# Patient Record
Sex: Male | Born: 1945 | Race: Black or African American | Hispanic: No | Marital: Single | Smoking: Former smoker
Health system: Southern US, Community
[De-identification: ages and names within clinical notes are randomized; demographics above are authoritative.]

## PROBLEM LIST (undated history)

## (undated) DIAGNOSIS — N1831 Chronic kidney disease, stage 3a: Secondary | ICD-10-CM

## (undated) DIAGNOSIS — I429 Cardiomyopathy, unspecified: Secondary | ICD-10-CM

## (undated) DIAGNOSIS — I679 Cerebrovascular disease, unspecified: Secondary | ICD-10-CM

## (undated) DIAGNOSIS — E785 Hyperlipidemia, unspecified: Secondary | ICD-10-CM

## (undated) DIAGNOSIS — I5042 Chronic combined systolic (congestive) and diastolic (congestive) heart failure: Secondary | ICD-10-CM

## (undated) DIAGNOSIS — I10 Essential (primary) hypertension: Secondary | ICD-10-CM

## (undated) DIAGNOSIS — I5022 Chronic systolic (congestive) heart failure: Secondary | ICD-10-CM

## (undated) DIAGNOSIS — B1920 Unspecified viral hepatitis C without hepatic coma: Secondary | ICD-10-CM

## (undated) DIAGNOSIS — K7460 Unspecified cirrhosis of liver: Secondary | ICD-10-CM

## (undated) DIAGNOSIS — M109 Gout, unspecified: Secondary | ICD-10-CM

## (undated) DIAGNOSIS — E7800 Pure hypercholesterolemia, unspecified: Secondary | ICD-10-CM

## (undated) DIAGNOSIS — I700 Atherosclerosis of aorta: Secondary | ICD-10-CM

## (undated) HISTORY — DX: Unspecified cirrhosis of liver: K74.60

## (undated) HISTORY — DX: Cardiomyopathy, unspecified: I42.9

## (undated) HISTORY — DX: Essential (primary) hypertension: I10

## (undated) HISTORY — PX: HERNIA REPAIR: SHX51

## (undated) HISTORY — DX: Cerebrovascular disease, unspecified: I67.9

## (undated) HISTORY — DX: Atherosclerosis of aorta: I70.0

## (undated) HISTORY — DX: Unspecified viral hepatitis C without hepatic coma: B19.20

## (undated) HISTORY — DX: Chronic systolic (congestive) heart failure: I50.22

## (undated) HISTORY — DX: Hyperlipidemia, unspecified: E78.5

## (undated) HISTORY — DX: Chronic kidney disease, stage 3a: N18.31

## (undated) HISTORY — DX: Gout, unspecified: M10.9

---
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
## 2003-01-10 ENCOUNTER — Emergency Department (HOSPITAL_COMMUNITY): Admission: EM | Admit: 2003-01-10 | Discharge: 2003-01-10 | Payer: Self-pay | Admitting: Emergency Medicine

## 2003-11-10 ENCOUNTER — Inpatient Hospital Stay (HOSPITAL_COMMUNITY): Admission: EM | Admit: 2003-11-10 | Discharge: 2003-11-12 | Payer: Self-pay | Admitting: Emergency Medicine

## 2005-03-30 ENCOUNTER — Ambulatory Visit (HOSPITAL_COMMUNITY): Admission: RE | Admit: 2005-03-30 | Discharge: 2005-03-30 | Payer: Self-pay | Admitting: Preventative Medicine

## 2006-01-29 ENCOUNTER — Emergency Department (HOSPITAL_COMMUNITY): Admission: EM | Admit: 2006-01-29 | Discharge: 2006-01-29 | Payer: Self-pay | Admitting: Emergency Medicine

## 2007-11-15 ENCOUNTER — Emergency Department (HOSPITAL_COMMUNITY): Admission: EM | Admit: 2007-11-15 | Discharge: 2007-11-15 | Payer: Self-pay | Admitting: Emergency Medicine

## 2007-12-03 ENCOUNTER — Emergency Department (HOSPITAL_COMMUNITY): Admission: EM | Admit: 2007-12-03 | Discharge: 2007-12-03 | Payer: Self-pay | Admitting: Emergency Medicine

## 2008-07-13 ENCOUNTER — Emergency Department (HOSPITAL_COMMUNITY): Admission: EM | Admit: 2008-07-13 | Discharge: 2008-07-13 | Payer: Self-pay | Admitting: Emergency Medicine

## 2009-05-15 ENCOUNTER — Emergency Department (HOSPITAL_COMMUNITY): Admission: EM | Admit: 2009-05-15 | Discharge: 2009-05-15 | Payer: Self-pay | Admitting: Emergency Medicine

## 2009-08-31 ENCOUNTER — Emergency Department (HOSPITAL_COMMUNITY): Admission: EM | Admit: 2009-08-31 | Discharge: 2009-08-31 | Payer: Self-pay | Admitting: Emergency Medicine

## 2009-10-16 ENCOUNTER — Emergency Department (HOSPITAL_COMMUNITY): Admission: EM | Admit: 2009-10-16 | Discharge: 2009-10-16 | Payer: Self-pay | Admitting: Emergency Medicine

## 2010-12-31 LAB — DIFFERENTIAL
Eosinophils Absolute: 0.2 10*3/uL (ref 0.0–0.7)
Lymphs Abs: 2.1 10*3/uL (ref 0.7–4.0)
Monocytes Absolute: 0.8 10*3/uL (ref 0.1–1.0)
Monocytes Relative: 7 % (ref 3–12)
Neutrophils Relative %: 72 % (ref 43–77)

## 2010-12-31 LAB — URINALYSIS, ROUTINE W REFLEX MICROSCOPIC
Glucose, UA: NEGATIVE mg/dL
Hgb urine dipstick: NEGATIVE
Specific Gravity, Urine: 1.02 (ref 1.005–1.030)

## 2010-12-31 LAB — COMPREHENSIVE METABOLIC PANEL
ALT: 72 U/L — ABNORMAL HIGH (ref 0–53)
AST: 82 U/L — ABNORMAL HIGH (ref 0–37)
Albumin: 3.5 g/dL (ref 3.5–5.2)
Alkaline Phosphatase: 88 U/L (ref 39–117)
Calcium: 9.5 mg/dL (ref 8.4–10.5)
GFR calc Af Amer: >60 mL/min (ref >60)
Glucose, Bld: 102 mg/dL — ABNORMAL HIGH (ref 70–99)
Potassium: 4.4 mEq/L (ref 3.5–5.1)
Sodium: 138 mEq/L (ref 135–145)
Total Protein: 8 g/dL (ref 6.0–8.3)

## 2010-12-31 LAB — CBC
MCHC: 34.6 g/dL (ref 30.0–36.0)
Platelets: 184 10*3/uL (ref 150–400)
RBC: 4.56 MIL/uL (ref 4.22–5.81)
RDW: 13.2 % (ref 11.5–15.5)
WBC: 11.3 10*3/uL — ABNORMAL HIGH (ref 4.0–10.5)

## 2011-02-10 NOTE — H&P (Signed)
NAME:  Don [name], Don [name]                         ACCOUNT NO.:  [id number]   MEDICAL RECORD NO.:  [id number]                   PATIENT TYPE:  INP   LOCATION:  A307                                 FACILITY:  [facility]   PHYSICIAN:  Vania Rea, M.D.              DATE OF BIRTH:  08/12/1946   DATE OF ADMISSION:  11/09/2003  DATE OF DISCHARGE:                                HISTORY & PHYSICAL   PRIMARY CARE PHYSICIAN:  Unassigned.   CHIEF COMPLAINT:  Pain and swelling of the left leg for 1 week.   HISTORY OF PRESENT ILLNESS:  This is a 65 year old African-American man with  no history of medical follow up, unemployed.  He drinks 1 pint of gin per  day, for many many years.  Smokes 1/2 pack of cigarettes per day since age  33. He is sexually active without condoms.  Last sexual activity 2 weeks ago.  Denies any significant past medical history, but has been having pain and  swelling of the left knee for 1 week.  The patient denies fevers, cough,  cold or chills.  The patient had not sought any treatment, but then  developed pain and swelling of the left foot 1-2 days ago and came to the  emergency room for assessment.   The patient has recurrent rash of the trunk for many many years, scaling and  itching comes and goes.  The patient has been having a rash of the face and  scalp.  He says for the past 1 year.  The patient has never been in prison,  but has been in jail; has never been tested for tuberculosis; has never been  tested for HIV.  The patient denies any discharge from the penis and denies  any sexually transmitted disease.   PAST MEDICAL HISTORY:  Recurrent rashes; rash on his face for 1 year.   MEDICATIONS:  Nil.   ALLERGIES:  BEE STINGS.   SOCIAL AND FAMILY HISTORY:  Tobacco 1/2 pack per day for 49 years.  Alcohol  1 pint of gin per day for over 40 years.  Denies marijuana, heroin, or  cocaine.  Heart disease in his mother and sister; but does not know most of  his family.   Patient denies sexual activity with men.   REVIEW OF SYSTEMS:  No further contributory.   PHYSICAL EXAMINATION:  GENERAL:  A middle-age, African-American man lying in  bed in no apparent distress.  VITAL SIGNS:  Temperature 98.4, pulse 67, respirations 20, blood pressure  119/68.  His temperature recorded in the emergency room was 99 degrees.  HEENT:  He has extensive seborrheic dermatitis of the entire scalp and  crusting around his ears with inflammatory seborrheic dermatitis of his face  including the nasolabial folds.  His mouth is moist.  No rashes.  He has a  few missing teeth.  Pupils are equal, round, and reactive to light.  He  is  anicteric.  There is no jugular venous distention.  No thyromegaly.  CHEST:  His chest is clear to auscultation bilaterally.  CARDIOVASCULAR:  Regular rhythm, no murmurs, rubs, or gallops.  ABDOMEN:  Mildly obese, soft, nontender, with normal abdominal bowel sounds.  EXTREMITIES:  He has swollen tender left knee with an obvious effusion.  There is no obvious swelling or tenderness of the legs.  On the left he has  multiple calluses of the edge of his feet and the soles of his feet.  Extending from a callus of the left outer edge of his left foot, is an area  of redness, swelling, and tenderness over the proximal metatarsal joints.  There are no cracks between his toes.  SKIN:  As mentioned he has extensive seborrheic dermatitis of the head and  neck.  On his trunk he has a few well-marginated, scaly, purplish patches 3  x 4 cm on his trunk, left side of his anterior abdomen and also the right  side of his back and also patches on his elbow.  These are compatible with  psoriasis.  The patient says they come and go without leaving a scar.  NAILS:  There is no evidence of pitting of his nails.  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  CENTRAL NERVOUS  SYSTEM:  He is alert and oriented x3. There are no focal  deficits.   LABS:  His white count is 16.4.  His hemoglobin and hematocrit are normal.  His white count is otherwise normal.  His sodium 131, potassium 3.6,  chloride 97, CO2 28, glucose 113, BUN 20, creatinine 9.  The patient  received 1 gm of Rocephin IV in the emergency room.   ASSESSMENT:  1. Cellulitis of the left foot.  2. Septic arthritis of the left knee.  3. Tobacco abuse.  4. EtOH abuse.  5. Psoriasis.  6. 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   PLAN:  1. In view of this conglomeration of system symptoms, gonococcal arthritis     of the left knee needs to be considered as well as the patient's HIV     status needs to be investigated.  2. We will continue with Rocephin and clindamycin for the cellulitis.  3. We will give him a steroid cream for the psoriasis as well as for the     face for the seborrheic dermatitis, a coal tar shampoo for his scalp.  4. We will aspirate the knee and send for cultures and investigations.  5. We will take blood for HIV testing.  6. Will put him on the Librium EtOH withdrawal protocol.     ___________________________________________                                         Vania Rea, M.D.   LC/MEDQ  D:  11/10/2003  T:  11/10/2003  Job:  [id number]

## 2011-02-10 NOTE — Discharge Summary (Signed)
Don [name], Don [name]                         ACCOUNT NO.:  [id number]   MEDICAL RECORD NO.:  [id number]                   PATIENT TYPE:  INP   LOCATION:  A307                                 FACILITY:  [facility]   PHYSICIAN:  Vania Rea, M.D.              DATE OF BIRTH:  Sep 04, 1946   DATE OF ADMISSION:  11/09/2003  DATE OF DISCHARGE:  11/12/2003                                 DISCHARGE SUMMARY   DISCHARGE DIAGNOSES:  1. 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  2. 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  3. EtOH abuse.  4. Status post EtOH withdrawal.  5. Tobacco abuse.  6. Psoriasis of the trunk and elbows.  7. Severe seborrheic eczema involving the scalp and the face.   DISPOSITION:  Discharged to home.   DISCHARGE CONDITION:  Improved.   HOSPITAL COURSE:  Please refer to history and physical of February 15.  This  is a 65 year old African-American male with a history of chronic EtOH abuse,  chronic tobacco abuse, sexually active without condoms who presented with a  significant swelling of the left knee and what appeared to be a cellulitis  of the lateral aspect of the left foot.   The patient was initially assessed as having a septic arthritis with  cellulitis of the foot, but after aspiration of the joint and analysis he  was confirmed to have gout by the presence of intracellular monosodium urate  crystals within the knee joint aspirate.  Antibiotics were discontinued and  the patient was treated with indomethacin and colchicine.  The patient had  previously been receiving ibuprofen as a part of his cellulitis regimen and  had been improving.  The patient was also noted, on admission, to have  extensive seborrheic eczema involving the face including nasolabial fold and  crusting around the ears and the scalp.  He was also noted to have patches  of purplish, well-marginated scaly lesions on the trunk and the  elbows  compatible with psoriasis which he said had been coming and going for many  years.  Because of the patient's severe eczema and his history of unsafe  sex, the patient had a VDRL and HIV test both of which were negative for  sexually transmitted disease.   On admission the patient's white count was 16.4, BUN of 20 and a creatinine  of 9.  His MCV was 97.  Today, at discharge, his white count is 12.3.  His  MCV remains at 97.  His BUN and creatinine are 7 and 0.8.  Liver related  enzymes have been completely normal throughout this admission.  However, his  __________ has been depressed 1.8-1.9; his total protein 5.9.  The patient  was treated also with folic acid, multivitamins and thiamin while in  hospital.   The patient is able to ambulate slowly with  the assistance of a walker.  The  patient was put on the EtOH withdrawal protocol because of his history of 1  pint of gin daily for over 40 years.  He tolerated the protocol without  difficulty.  He did not go into DTs.  The patient has been advised that his  diagnosis is acute gouty arthritis; that he will, in all likelihood, need  treatment for a prolonged period; that he has to abstain from alcohol as  this can aggravate his condition.  He has also been advised to avoid poultry  especially [place] and red meats. The patient had a 24-hour urine collection  to assess uric acid output.  The results are still pending.  The patient's  serum uric acid while in hospital was, at one time elevated at 7.8 and at  another time normal.   The patient is being discharged with a months' supply of indomethacin, a  months' supply of colchicine 0.6 mg  3 times a day, a months' supply of  gradually increasing allopurinol with the understanding that he will be  called with the results of his urinary acid excretion.  If the uric acid  output is low the allopurinol will be discontinued.  If it is over 110  mg/day it will be continued.   DISCHARGE  MEDICATIONS:  1. Indomethacin 50 mg 3 times a day.  2. Colchicine 0.6 mg 3 times daily.  3. Allopurinol 100 mg daily for 1 week, then 200 mg daily for 1 week, then     300 mg daily.  4. Librium 10 mg 3 times daily when necessary, 20 tablets prescribed.  5. Folic acid 1 mg daily.  6. Thiamine 100 mg daily.   FOLLOW UP:  The patient is to follow up with the [facility]  Department.  The patient is to have home health care to assess the progress  with the swelling of his knee; that he is ambulating sufficiently; and that  his medications are being taken correctly.     ___________________________________________                                         Vania Rea, M.D.   LC/MEDQ  D:  11/12/2003  T:  11/12/2003  Job:  [id number]

## 2011-06-19 LAB — BODY FLUID CELL COUNT WITH DIFFERENTIAL
Monocyte-Macrophage-Serous Fluid: 2 — ABNORMAL LOW
Neutrophil Count, Fluid: 94 — ABNORMAL HIGH
Total Nucleated Cell Count, Fluid: 18800 — ABNORMAL HIGH

## 2011-06-19 LAB — BODY FLUID CULTURE

## 2011-06-19 LAB — SYNOVIAL FLUID, CRYSTAL

## 2011-06-26 LAB — DIFFERENTIAL
Basophils Absolute: 0.1
Lymphocytes Relative: 17
Lymphs Abs: 1.4
Monocytes Absolute: 0.4
Monocytes Relative: 5
Neutro Abs: 5.9

## 2011-06-26 LAB — COMPREHENSIVE METABOLIC PANEL
AST: 115 — ABNORMAL HIGH
Albumin: 3.4 — ABNORMAL LOW
Calcium: 9.2
Creatinine, Ser: 1.02
GFR calc Af Amer: >60
Total Protein: 8.3

## 2011-06-26 LAB — CBC
MCHC: 34.8
MCV: 98.8
Platelets: 223
WBC: 8

## 2011-06-26 LAB — URINALYSIS, ROUTINE W REFLEX MICROSCOPIC
Bilirubin Urine: NEGATIVE
Glucose, UA: NEGATIVE
Hgb urine dipstick: NEGATIVE
Nitrite: NEGATIVE
Specific Gravity, Urine: 1.015
pH: 7

## 2011-06-26 LAB — POCT CARDIAC MARKERS
CKMB, poc: 4.6
Troponin i, poc: <0.05

## 2011-06-26 LAB — TSH: TSH: 1.1

## 2011-06-26 LAB — T4, FREE: Free T4: 0.83 — ABNORMAL LOW

## 2011-08-24 ENCOUNTER — Emergency Department (HOSPITAL_COMMUNITY)
Admission: EM | Admit: 2011-08-24 | Discharge: 2011-08-24 | Disposition: A | Discharge status: Home / Self Care | Payer: Medicare Other | Attending: Emergency Medicine | Admitting: Emergency Medicine

## 2011-08-24 ENCOUNTER — Emergency Department (HOSPITAL_COMMUNITY): Payer: Medicare Other

## 2011-08-24 ENCOUNTER — Encounter: Payer: Self-pay | Admitting: *Deleted

## 2011-08-24 DIAGNOSIS — K1370 Unspecified lesions of oral mucosa: Secondary | ICD-10-CM | POA: Insufficient documentation

## 2011-08-24 DIAGNOSIS — S01501A Unspecified open wound of lip, initial encounter: Secondary | ICD-10-CM | POA: Insufficient documentation

## 2011-08-24 DIAGNOSIS — R609 Edema, unspecified: Secondary | ICD-10-CM | POA: Insufficient documentation

## 2011-08-24 DIAGNOSIS — I10 Essential (primary) hypertension: Secondary | ICD-10-CM | POA: Insufficient documentation

## 2011-08-24 DIAGNOSIS — W11XXXA Fall on and from ladder, initial encounter: Secondary | ICD-10-CM | POA: Insufficient documentation

## 2011-08-24 DIAGNOSIS — W19XXXA Unspecified fall, initial encounter: Secondary | ICD-10-CM

## 2011-08-24 DIAGNOSIS — Z87891 Personal history of nicotine dependence: Secondary | ICD-10-CM | POA: Insufficient documentation

## 2011-08-24 DIAGNOSIS — K029 Dental caries, unspecified: Secondary | ICD-10-CM | POA: Insufficient documentation

## 2011-08-24 DIAGNOSIS — S01511A Laceration without foreign body of lip, initial encounter: Secondary | ICD-10-CM

## 2011-08-24 HISTORY — DX: Essential (primary) hypertension: I10

## 2011-08-24 MED ORDER — HYDROCODONE-ACETAMINOPHEN 5-325 MG PO TABS: ORAL_TABLET | ORAL | Status: AC

## 2011-08-24 MED ORDER — ONDANSETRON HCL 4 MG/2ML IJ SOLN
4.0000 mg | Freq: Once | INTRAMUSCULAR | Status: AC
Start: 2011-08-24 — End: 2011-08-24
  Administered 2011-08-24: 4 mg via INTRAMUSCULAR
  Filled 2011-08-24: qty 2

## 2011-08-24 MED ORDER — LIDOCAINE HCL (PF) 1 % IJ SOLN
INTRAMUSCULAR | Status: AC
  Administered 2011-08-24: 5 mL
  Filled 2011-08-24: qty 5

## 2011-08-24 MED ORDER — CLINDAMYCIN HCL 300 MG PO CAPS: ORAL_CAPSULE | ORAL | Status: DC

## 2011-08-24 MED ORDER — LIDOCAINE HCL (PF) 1 % IJ SOLN
5.0000 mL | Freq: Once | INTRAMUSCULAR | Status: AC
  Administered 2011-08-24: 5 mL via SUBCUTANEOUS
  Administered 2011-08-24: 5 mL
  Filled 2011-08-24: qty 5

## 2011-08-24 MED ORDER — MORPHINE SULFATE 10 MG/ML IJ SOLN
6.0000 mg | Freq: Once | INTRAMUSCULAR | Status: AC
  Administered 2011-08-24: 6 mg via INTRAMUSCULAR
  Filled 2011-08-24: qty 1

## 2011-08-24 MED ORDER — MORPHINE SULFATE 10 MG/ML IJ SOLN
2.0000 mg | Freq: Once | INTRAMUSCULAR | Status: AC
  Administered 2011-08-24: 2 mg via INTRAMUSCULAR
  Filled 2011-08-24: qty 1

## 2011-08-24 MED ORDER — LIDOCAINE HCL (PF) 1 % IJ SOLN
INTRAMUSCULAR | Status: AC
  Administered 2011-08-24: 5 mL via SUBCUTANEOUS
  Filled 2011-08-24: qty 5

## 2011-08-24 NOTE — ED Notes (Signed)
Pts face cleaned, rinsed mouth out with water. NAD.

## 2011-08-24 NOTE — ED Notes (Signed)
Pt was coming down a ladder and fell approximately 5-6 feet. Pt landed on his face and has a very large laceration to his upper lip. Bleeding at this time. Denies loss of consciousness.

## 2011-08-24 NOTE — ED Provider Notes (Signed)
History     CSN: [id number] Arrival date & time: 08/24/2011 11:40 AM   First MD Initiated Contact with Patient 08/24/11 1153      Chief Complaint  Patient presents with  . Fall    (Consider location/radiation/quality/duration/timing/severity/associated sxs/prior treatment) HPI Comments: Patient c/o pain to his mouth and chin with a laceration to the upper lip and inability to retract the lower lip.  He denies other injuries, neck pain, back pain or LOC.  Patient is a 65 y.o. male presenting with skin laceration. The history is provided by the patient.  Laceration  The incident occurred less than 1 hour ago. Pain location: upper and lower lip. The laceration is 8 cm in size. The laceration mechanism was a a blunt object. The pain is moderate. The pain has been constant since onset. He reports no foreign bodies present. His tetanus status is UTD.    Past Medical History  Diagnosis Date  . Hypertension     History reviewed. No pertinent past surgical history.  History reviewed. No pertinent family history.  History  Substance Use Topics  . Smoking status: Former Games developer  . Smokeless tobacco: Not on file  . Alcohol Use: Yes     occasionally      Review of Systems  Constitutional: Negative for fever, chills and fatigue.  HENT: Positive for facial swelling and dental problem. Negative for ear pain, nosebleeds, congestion, sore throat, rhinorrhea, trouble swallowing, neck pain and neck stiffness.   Eyes: Negative for visual disturbance.  Respiratory: Negative for chest tightness and shortness of breath.   Cardiovascular: Negative for chest pain.  Gastrointestinal: Negative for nausea, vomiting and abdominal pain.  Genitourinary: Negative for dysuria, hematuria and flank pain.  Musculoskeletal: Negative for myalgias, back pain and arthralgias.  Skin: Positive for wound. Negative for rash.  Neurological: Negative for dizziness, syncope, weakness, numbness and headaches.    Hematological: Does not bruise/bleed easily.  Psychiatric/Behavioral: Negative for confusion.  All other systems reviewed and are negative.    Allergies  Penicillins  Home Medications   Current Outpatient Rx  Name Route Sig Dispense Refill  . ALLOPURINOL 100 MG PO TABS Oral Take 100 mg by mouth daily.      Marland Kitchen LISINOPRIL 10 MG PO TABS Oral Take 10 mg by mouth daily.      Marland Kitchen NAPROXEN 500 MG PO TABS Oral Take 500 mg by mouth 2 (two) times daily as needed. For pain     . OMEPRAZOLE 20 MG PO CPDR Oral Take 20 mg by mouth 2 (two) times daily.        BP 173/85  Pulse 73  Temp(Src) 97.3 F (36.3 C) (Axillary)  Resp 20  Ht 5\' 8"  (1.727 m)  Wt 165 lb (74.844 kg)  BMI 25.09 kg/m2  SpO2 98%  Physical Exam  Nursing note and vitals reviewed. Constitutional: He is oriented to person, place, and time. He appears well-developed and well-nourished. No distress.  HENT:  Head: Normocephalic and atraumatic. No trismus in the jaw.  Right Ear: Tympanic membrane and ear canal normal. No hemotympanum.  Left Ear: Tympanic membrane and ear canal normal. No hemotympanum.  Nose: Nose normal. No nose lacerations, sinus tenderness or nasal deformity. No epistaxis.  Mouth/Throat: Uvula is midline, oropharynx is clear and moist and mucous membranes are normal. Abnormal dentition. Lacerations and dental caries present. No uvula swelling.         Extensive laceration of the upper lip that extends through the [place] border and into  the oral mucosa.  Multiple dental caries with three lower teeth embedded into the mucosa of the lower lip.  Eyes: Conjunctivae and EOM are normal. Pupils are equal, round, and reactive to light.  Neck: Normal range of motion. Neck supple.  Cardiovascular: Normal rate, regular rhythm and normal heart sounds.   No murmur heard. Pulmonary/Chest: Effort normal and breath sounds normal. No respiratory distress. He exhibits no tenderness.  Abdominal: Soft. He exhibits no  distension. There is no tenderness.  Musculoskeletal: Normal range of motion. He exhibits no tenderness.  Lymphadenopathy:    He has no cervical adenopathy.  Neurological: He is alert and oriented to person, place, and time. No cranial nerve deficit. He exhibits normal muscle tone. Coordination normal.  Skin: Skin is warm and dry.       See HENT exam  Psychiatric: He has a normal mood and affect.    ED Course  Procedures (including critical care time)  Ct Maxillofacial Wo Cm  08/24/2011  *RADIOLOGY REPORT*  Clinical Data: Larey Seat face first from a ladder, pain at mouth, states she cannot move jaw  CT MAXILLOFACIAL WITHOUT CONTRAST  Technique:  Multidetector CT imaging of the maxillofacial structures was performed. Multiplanar CT image reconstructions were also generated. Right side of face marked with BB.  Comparison: None Correlation:  MRI brain 03/30/2005  Findings: Intraorbital soft tissue planes clear. Visualized intracranial structures significant only for mild age related cerebral atrophy. Minimal supraorbital scalp contusion/soft tissue swelling. Extensive atherosclerotic calcification at the left carotid bifurcation. Deformity of medial wall of left orbit compatible with old fracture, unchanged since MRI. Scattered periodontal disease. Visualized portion of cervical spine intact. No acute facial bone fracture or acute bony abnormalities identified.  IMPRESSION: Old fracture medial wall left orbit. No acute facial bony abnormalities. Scattered periodontal disease. Significant calcified plaque formation at the left carotid bifurcation.  Original Report Authenticated By: Lollie Marrow, M.D.   LACERATION REPAIR Performed by: Maxwell Caul. Authorized by: Maxwell Caul Consent: Verbal consent obtained. Risks and benefits: risks, benefits and alternatives were discussed Consent given by: patient Patient identity confirmed: provided demographic data Prepped and Draped in normal sterile  fashion Wound explored  Laceration Location:upper lip Laceration Length: 8 cm  No Foreign Bodies seen or palpated  Anesthesia: local infiltration  Local anesthetic: lidocaine 1% plain lidocaine  Anesthetic total: 3ml  Irrigation method: syringe Amount of cleaning: standard  Skin closure: 6-0 vicryl Number of sutures:12 Technique: subcuticular, simple interrupted Patient tolerance: Patient tolerated the procedure well with no immediate complications.   LACERATION REPAIR Performed by: Suriah Peragine L. Authorized by: Maxwell Caul Consent: Verbal consent obtained. Risks and benefits: risks, benefits and alternatives were discussed Consent given by: patient Patient identity confirmed: provided demographic data Prepped and Draped in normal sterile fashion Wound explored  Laceration Location: oral mucosa,lower lip Laceration Length: 2cm  No Foreign Bodies seen or palpated  Anesthesia: local infiltration  Local anesthetic: lidocaine 1% w/o epinephrine  Anesthetic total: 1 ml  Irrigation method: syringe Amount of cleaning: standard  Skin closure:6-0 vicryl  Number of sutures: 3  Technique:simple interrupted  Patient tolerance: Patient tolerated the procedure well with no immediate complications.    MDM  1350  Diffuse edema of the chin and unable to retract the lower lip.  Will order CT maxillofacial to r/o mandible fx   1400  I have discussed pt hx and exam findings with EDP.  Patient also seen by EDP.    1650  I have consulted general dentist on  call, Dr. Renard Hamper, he agrees to see pt in his office for follow-up next week.  Lacerations to the upper and lower lips were approximated with vicryl sutures.  Patient has three teeth embedded in the oral mucosa and lower lip was successfully retracted with manipulation were due to multiple dental caries and dental avulsions, I will start him on Clindamycin and he agrees to f/u with dentistry.        Manfred Laspina L.  [place], [place] 08/24/11 1759

## 2011-08-24 NOTE — ED Notes (Signed)
Also c/o pain in his chin and jaw.

## 2011-08-24 NOTE — ED Notes (Signed)
Patient transported to CT.  NAD at this time. 

## 2011-08-24 NOTE — ED Notes (Signed)
Pt alert and oriented x 4 with respirations even and unlabored.  NAD at this time.  Discharge instructions and Rx x 2 reviewed with pt and pt's family member and pt and family member verbalized understanding.  Pt transported to lobby via wheelchair for pt safety.  Patient's wife to transport home.

## 2011-08-25 NOTE — ED Provider Notes (Signed)
Medical screening examination/treatment/procedure(s) were conducted as a shared visit with non-physician practitioner(s) and myself.  I personally evaluated the patient during the encounter  Pt reportedly fell 5-6 feet from ladder landing on his face.  Significant laceration to lip.  No loc or use of blood thinning medications.  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  On lower lip had 3 teeth embedded into mucosa of lip.  No malocclusion, epistaxis, septal hematoma.  Some swelling to the chin.  Has some loose teeth which he states were all loose prior to the injury  CT maxfac, repair wounds, tdap, abx.  Discuss with dental for urgent follow up  Dayton Bailiff, MD 08/25/11 [phone]

## 2012-02-01 ENCOUNTER — Encounter (HOSPITAL_COMMUNITY): Payer: Self-pay | Admitting: Emergency Medicine

## 2012-02-01 ENCOUNTER — Emergency Department (HOSPITAL_COMMUNITY): Payer: Medicare Other

## 2012-02-01 ENCOUNTER — Emergency Department (HOSPITAL_COMMUNITY)
Admission: EM | Admit: 2012-02-01 | Discharge: 2012-02-01 | Disposition: A | Discharge status: Home / Self Care | Payer: Medicare Other | Attending: Emergency Medicine | Admitting: Emergency Medicine

## 2012-02-01 DIAGNOSIS — R9431 Abnormal electrocardiogram [ECG] [EKG]: Secondary | ICD-10-CM | POA: Insufficient documentation

## 2012-02-01 DIAGNOSIS — Z79899 Other long term (current) drug therapy: Secondary | ICD-10-CM | POA: Insufficient documentation

## 2012-02-01 DIAGNOSIS — R040 Epistaxis: Secondary | ICD-10-CM | POA: Insufficient documentation

## 2012-02-01 DIAGNOSIS — R42 Dizziness and giddiness: Secondary | ICD-10-CM | POA: Insufficient documentation

## 2012-02-01 DIAGNOSIS — E7800 Pure hypercholesterolemia, unspecified: Secondary | ICD-10-CM | POA: Insufficient documentation

## 2012-02-01 DIAGNOSIS — I10 Essential (primary) hypertension: Secondary | ICD-10-CM | POA: Insufficient documentation

## 2012-02-01 DIAGNOSIS — H538 Other visual disturbances: Secondary | ICD-10-CM | POA: Insufficient documentation

## 2012-02-01 HISTORY — DX: Pure hypercholesterolemia, unspecified: E78.00

## 2012-02-01 LAB — BASIC METABOLIC PANEL
Chloride: 103 mEq/L (ref 96–112)
Creatinine, Ser: 0.85 mg/dL (ref 0.50–1.35)
GFR calc Af Amer: >90 mL/min (ref >90)
GFR calc non Af Amer: 89 mL/min — ABNORMAL LOW (ref >90)
Potassium: 3.7 mEq/L (ref 3.5–5.1)

## 2012-02-01 LAB — URINALYSIS, ROUTINE W REFLEX MICROSCOPIC
Ketones, ur: NEGATIVE mg/dL
Leukocytes, UA: NEGATIVE
Nitrite: NEGATIVE
Specific Gravity, Urine: 1.015 (ref 1.005–1.030)
Urobilinogen, UA: 1 mg/dL (ref 0.0–1.0)
pH: 6.5 (ref 5.0–8.0)

## 2012-02-01 LAB — CBC
MCH: 34.3 pg — ABNORMAL HIGH (ref 26.0–34.0)
MCHC: 34.9 g/dL (ref 30.0–36.0)
RDW: 13.1 % (ref 11.5–15.5)

## 2012-02-01 LAB — DIFFERENTIAL
Basophils Absolute: 0.1 10*3/uL (ref 0.0–0.1)
Basophils Relative: 1 % (ref 0–1)
Eosinophils Absolute: 0.7 10*3/uL (ref 0.0–0.7)
Neutro Abs: 3.9 10*3/uL (ref 1.7–7.7)
Neutrophils Relative %: 55 % (ref 43–77)

## 2012-02-01 MED ORDER — MECLIZINE HCL 25 MG PO TABS: 25.0000 mg | ORAL_TABLET | Freq: Three times a day (TID) | ORAL | Status: AC | PRN

## 2012-02-01 MED ORDER — OXYMETAZOLINE HCL 0.05 % NA SOLN
2.0000 | Freq: Once | NASAL | Status: AC
  Administered 2012-02-01: 2 via NASAL
  Filled 2012-02-01: qty 15

## 2012-02-01 NOTE — ED Notes (Signed)
tammy pa to see pt.

## 2012-02-01 NOTE — ED Notes (Signed)
Pt c/o dizziness since yesterday with nosebleed this am. Denies cp/sob. nad noted.

## 2012-02-01 NOTE — ED Provider Notes (Signed)
10:35 AM  Date: 02/01/2012  Rate:61  Rhythm: normal sinus rhythm and sinus arrhythmia  QRS Axis: left  Intervals: normal QRS:  Left ventricular hypertrophy; poor R wave progression in the precordial leads suggests possible old anterior myocardial infarction.  ST/T Wave abnormalities: normal  Conduction Disutrbances:none  Narrative Interpretation: Abnormal EKG.  Old EKG Reviewed: unchanged    Carleene Cooper III, MD 02/01/12 1038

## 2012-02-01 NOTE — ED Notes (Signed)
Pt taken to mri by wheelchair.

## 2012-02-01 NOTE — ED Provider Notes (Signed)
Medical screening examination/treatment/procedure(s) were performed by non-physician practitioner and as supervising physician I was immediately available for consultation/collaboration.   Carleene Cooper III, MD 02/01/12 [phone]

## 2012-02-01 NOTE — ED Provider Notes (Signed)
History     CSN: [id number]  Arrival date & time 02/01/12  8295   First MD Initiated Contact with Patient 02/01/12 1001      Chief Complaint  Patient presents with  . Dizziness  . Epistaxis    (Consider location/radiation/quality/duration/timing/severity/associated sxs/prior treatment) HPI Comments: Patient complains of gradual onset of dizziness that began yesterday evening. He states the dizziness is worse with change in position. He denies a spinning sensation. He states that he woke up this morning with bleeding from the left nostril that lasted approximately 5 minutes and resolved after slight pressure. He also reports having blurred vision this morning and describes the sensation as" a film over my eyes". He states that his gait has been unsteady since yesterday.  He denies vomiting, chest pain, dyspnea, numbness or weakness. His spouse reports that he has had similar symptoms previously that resolved spontaneously. Patient also has an appointment with his primary care physician, Dr. Janna Arch, next month.  Patient also denies new medications or recent change in dosage.    Patient is a 66 y.o. male presenting with nosebleeds and neurologic complaint. The history is provided by the patient and the spouse.  Epistaxis  This is a new problem. The current episode started less than 1 hour ago. The problem has been resolved. The problem is associated with an unknown factor. The bleeding has been from the left nare. He has tried applying pressure for the symptoms. The treatment provided significant relief. His past medical history does not include bleeding disorder.  Neurologic Problem The primary symptoms include dizziness and visual change. Primary symptoms do not include headaches, syncope, loss of consciousness, altered mental status, seizures, paresthesias, focal weakness, loss of sensation, speech change, memory loss, fever, nausea or vomiting. The symptoms began yesterday. The symptoms are  unchanged. The neurological symptoms are diffuse.  He describes the dizziness as lightheadedness. The dizziness began yesterday. The dizziness has been unchanged since its onset. It is a recurrent problem. Dizziness also occurs with blurred vision. Dizziness does not occur with tinnitus, hearing loss, nausea, vomiting, weakness or diaphoresis.  The visual change includes blurred vision. The visual change does not include photophobia.  Additional symptoms include loss of balance. Additional symptoms do not include neck stiffness, weakness, pain, lower back pain, leg pain, photophobia, aura, nystagmus, hyperacusis, hearing loss, tinnitus or anxiety. Medical issues also include hypertension. Medical issues do not include cerebral vascular accident, drug use or diabetes. Workup history does not include MRI.    Past Medical History  Diagnosis Date  . Hypertension   . High cholesterol     History reviewed. No pertinent past surgical history.  History reviewed. No pertinent family history.  History  Substance Use Topics  . Smoking status: Former Games developer  . Smokeless tobacco: Not on file  . Alcohol Use: Yes     occasionally      Review of Systems  Constitutional: Negative for fever, diaphoresis, activity change and appetite change.  HENT: Positive for nosebleeds. Negative for hearing loss, congestion, facial swelling, trouble swallowing, neck pain, neck stiffness and tinnitus.   Eyes: Positive for blurred vision and visual disturbance. Negative for photophobia, pain and redness.  Respiratory: Negative for shortness of breath.   Cardiovascular: Negative for chest pain, palpitations and syncope.  Gastrointestinal: Negative for nausea and vomiting.  Skin: Negative for rash and wound.  Neurological: Positive for dizziness, light-headedness and loss of balance. Negative for speech change, focal weakness, seizures, loss of consciousness, syncope, facial asymmetry, speech difficulty,  weakness,  numbness, headaches and paresthesias.  Hematological: Does not bruise/bleed easily.  Psychiatric/Behavioral: Negative for memory loss, confusion, decreased concentration and altered mental status.  All other systems reviewed and are negative.    Allergies  Penicillins  Home Medications   Current Outpatient Rx  Name Route Sig Dispense Refill  . ALLOPURINOL 100 MG PO TABS Oral Take 100 mg by mouth daily.      Marland Kitchen LISINOPRIL 10 MG PO TABS Oral Take 10 mg by mouth daily.      Marland Kitchen OMEPRAZOLE 20 MG PO CPDR Oral Take 20 mg by mouth 2 (two) times daily.        BP 156/83  Pulse 56  Temp 98.1 F (36.7 C)  Resp 20  Ht 6' (1.829 m)  Wt 170 lb (77.111 kg)  BMI 23.06 kg/m2  SpO2 97%  Physical Exam  Nursing note and vitals reviewed. Constitutional: He is oriented to person, place, and time. He appears well-developed and well-nourished.  HENT:  Head: Normocephalic and atraumatic. No trismus in the jaw.  Right Ear: Tympanic membrane and ear canal normal. No mastoid tenderness. No hemotympanum.  Left Ear: Tympanic membrane and ear canal normal. No mastoid tenderness. No hemotympanum.  Nose: No mucosal edema or nose lacerations.  Mouth/Throat: Uvula is midline, oropharynx is clear and moist and mucous membranes are normal. No uvula swelling.       Dried blood is present the left nare.  Excoriation to the left anterior septum.  No edema, no active bleeding.  Oropharynx is clear, patient is edentulous.  Eyes: Conjunctivae and EOM are normal. Pupils are equal, round, and reactive to light.  Neck: Normal range of motion and phonation normal. Neck supple. No Brudzinski's sign and no Kernig's sign noted.  Cardiovascular: Normal rate, regular rhythm, normal heart sounds and intact distal pulses.   No murmur heard. Pulmonary/Chest: Effort normal and breath sounds normal. No respiratory distress.  Abdominal: Soft. Bowel sounds are normal. He exhibits no distension. There is no tenderness.    Musculoskeletal: He exhibits no edema and no tenderness.  Lymphadenopathy:    He has no cervical adenopathy.  Neurological: He is alert and oriented to person, place, and time. He has normal strength. No cranial nerve deficit or sensory deficit. Coordination normal.  Reflex Scores:      Tricep reflexes are 2+ on the right side and 2+ on the left side.      Bicep reflexes are 2+ on the right side and 2+ on the left side.      Brachioradialis reflexes are 2+ on the right side and 2+ on the left side.      Patellar reflexes are 2+ on the right side and 2+ on the left side.      Achilles reflexes are 2+ on the right side and 2+ on the left side. Skin: Skin is warm and dry. No rash noted.    ED Course  Procedures (including critical care time)  Results for orders placed during the hospital encounter of 02/01/12  CBC      Component Value Range   WBC 7.2  4.0 - 10.5 (K/uL)   RBC 4.46  4.22 - 5.81 (MIL/uL)   Hemoglobin 15.3  13.0 - 17.0 (g/dL)   HCT 16.1  09.6 - 04.5 (%)   MCV 98.2  78.0 - 100.0 (fL)   MCH 34.3 (*) 26.0 - 34.0 (pg)   MCHC 34.9  30.0 - 36.0 (g/dL)   RDW 40.9  81.1 - 91.4 (%)  Platelets 197  150 - 400 (K/uL)  DIFFERENTIAL      Component Value Range   Neutrophils Relative 55  43 - 77 (%)   Neutro Abs 3.9  1.7 - 7.7 (K/uL)   Lymphocytes Relative 30  12 - 46 (%)   Lymphs Abs 2.1  0.7 - 4.0 (K/uL)   Monocytes Relative 6  3 - 12 (%)   Monocytes Absolute 0.4  0.1 - 1.0 (K/uL)   Eosinophils Relative 9 (*) 0 - 5 (%)   Eosinophils Absolute 0.7  0.0 - 0.7 (K/uL)   Basophils Relative 1  0 - 1 (%)   Basophils Absolute 0.1  0.0 - 0.1 (K/uL)  BASIC METABOLIC PANEL      Component Value Range   Sodium 138  135 - 145 (mEq/L)   Potassium 3.7  3.5 - 5.1 (mEq/L)   Chloride 103  96 - 112 (mEq/L)   CO2 27  19 - 32 (mEq/L)   Glucose, Bld 113 (*) 70 - 99 (mg/dL)   BUN 11  6 - 23 (mg/dL)   Creatinine, Ser 1.61  0.50 - 1.35 (mg/dL)   Calcium 9.3  8.4 - 09.6 (mg/dL)   GFR calc non Af  Amer 89 (*) >90 (mL/min)   GFR calc Af Amer >90  >90 (mL/min)  URINALYSIS, ROUTINE W REFLEX MICROSCOPIC      Component Value Range   Color, Urine AMBER (*) YELLOW    APPearance CLEAR  CLEAR    Specific Gravity, Urine 1.015  1.005 - 1.030    pH 6.5  5.0 - 8.0    Glucose, UA NEGATIVE  NEGATIVE (mg/dL)   Hgb urine dipstick NEGATIVE  NEGATIVE    Bilirubin Urine SMALL (*) NEGATIVE    Ketones, ur NEGATIVE  NEGATIVE (mg/dL)   Protein, ur NEGATIVE  NEGATIVE (mg/dL)   Urobilinogen, UA 1.0  0.0 - 1.0 (mg/dL)   Nitrite NEGATIVE  NEGATIVE    Leukocytes, UA NEGATIVE  NEGATIVE   PROTIME-INR      Component Value Range   Prothrombin Time 14.3  11.6 - 15.2 (seconds)   INR 1.09  0.00 - 1.49       Mr Brain Wo Contrast  02/01/2012  *RADIOLOGY REPORT*  Clinical Data: Dizziness.  Epistaxis.  MRI HEAD WITHOUT CONTRAST  Technique:  Multiplanar, multiecho pulse sequences of the brain and surrounding structures were obtained according to standard protocol without intravenous contrast.  Comparison: MRI 03/30/2005  Findings: Moderate atrophy is present, with mild progression from the prior study.  Slight chronic microvascular ischemic changes in the white matter.  No acute infarct.  Negative for mass or edema. No midline shift.  Negative for hemorrhage or fluid collection.  Mild chronic sinusitis.  IMPRESSION: Atrophy and minimal chronic microvascular ischemic changes in the white matter.  No acute abnormality.  Original Report Authenticated By: Camelia Phenes, M.D.     MDM     Previous medical charts, nursing notes and vitals signs from this visit were reviewed by me   All laboratory results and/or imaging results performed on this visit, if applicable, were reviewed by me and discussed with the patient and/or parent as well as recommendation for follow-up    MEDICATIONS GIVEN IN ED: afrin intranasally  Patient is alert. Epistaxis has resolved. No focal neuro deficits on exam. Patient appears stable at  this time. Orthostatic vital signs were reviewed by me patient is tablet tori with a steady gait. Has a history of similar symptoms that spontaneously  resolved. Will order an MRI of the brain to rule out occipital stroke.  Patient hx and care plan discussed with the EDP.  Patient appears stable for discharge, he agrees to close followup with his primary care physician    PRESCRIPTIONS GIVEN AT DISCHARGE:  Meclizine #20   Pt stable in ED with no significant deterioration in condition. Pt feels improved after observation and/or treatment in ED. Patient / Family / Caregiver understand and agree with initial ED impression and plan with expectations set for ED visit.  Patient agrees to return to ED for any worsening symptoms      Dejon Jungman L. Keyleen Cerrato, [place] 02/01/12 1702

## 2012-02-01 NOTE — Discharge Instructions (Signed)
Dizziness Dizziness is a common problem. It is a feeling of unsteadiness or lightheadedness. You may feel like you are about to faint. Dizziness can lead to injury if you stumble or fall. A person of any age group can suffer from dizziness, but dizziness is more common in older adults. CAUSES  Dizziness can be caused by many different things, including:  Middle ear problems.   Standing for too long.   Infections.   An allergic reaction.   Aging.   An emotional response to something, such as the sight of blood.   Side effects of medicines.   Fatigue.   Problems with circulation or blood pressure.   Excess use of alcohol, medicines, or illegal drug use.   Breathing too fast (hyperventilation).   An arrhythmia or problems with your heart rhythm.   Low red blood cell count (anemia).   Pregnancy.   Vomiting, diarrhea, fever, or other illnesses that cause dehydration.   Diseases or conditions such as Parkinson's disease, high blood pressure (hypertension), diabetes, and thyroid problems.   Exposure to extreme heat.  DIAGNOSIS  To find the cause of your dizziness, your caregiver may do a physical exam, lab tests, radiologic imaging scans, or an electrocardiography test (ECG).  TREATMENT  Treatment of dizziness depends on the cause of your symptoms and can vary greatly. HOME CARE INSTRUCTIONS   Drink enough fluids to keep your urine clear or pale yellow. This is especially important in very hot weather. In the elderly, it is also important in cold weather.   If your dizziness is caused by medicines, take them exactly as directed. When taking blood pressure medicines, it is especially important to get up slowly.   Rise slowly from chairs and steady yourself until you feel okay.   In the morning, first sit up on the side of the bed. When this seems okay, stand slowly while holding onto something until you know your balance is fine.   If you need to stand in one place for a  long time, be sure to move your legs often. Tighten and relax the muscles in your legs while standing.   If dizziness continues to be a problem, have someone stay with you for a day or two. Do this until you feel you are well enough to stay alone. Have the person call your caregiver if he or she notices changes in you that are concerning.   Do not drive or use heavy machinery if you feel dizzy.  SEEK IMMEDIATE MEDICAL CARE IF:   Your dizziness or lightheadedness gets worse.   You feel nauseous or vomit.   You develop problems with talking, walking, weakness, or using your arms, hands, or legs.   You are not thinking clearly or you have difficulty forming sentences. It may take a friend or family member to determine if your thinking is normal.   You develop chest pain, abdominal pain, shortness of breath, or sweating.   Your vision changes.   You notice any bleeding.   You have side effects from medicine that seems to be getting worse rather than better.  MAKE SURE YOU:   Understand these instructions.   Will watch your condition.   Will get help right away if you are not doing well or get worse.  Document Released: 03/07/2001 Document Revised: 08/31/2011 Document Reviewed: 03/31/2011 [facility] Patient Information 2012 [place], [place].Nosebleed A nosebleed can be caused by many things, including:  Getting hit hard in the nose.   Infections.  Dry nose.   Colds.   Medicines.  Your doctor may do lab testing if you get nosebleeds a lot and the cause is not known. HOME CARE   If your nose was packed with material, keep it there until your doctor takes it out. Put the pack back in your nose if the pack falls out.   Do not blow your nose for 12 hours after the nosebleed.   Sit up and bend forward if your nose starts bleeding again. Pinch the front half of your nose nonstop for 20 minutes.   Put petroleum jelly inside your nose every morning if you have a dry nose.   Use a  humidifier to make the air less dry.   Do not take aspirin.   Try not to strain, lift, or bend at the waist for many days after the nosebleed.  GET HELP RIGHT AWAY IF:   Nosebleeds keep happening and are hard to stop or control.   You have bleeding or bruises that are not normal on other parts of the body.   You have a fever.   The nosebleeds get worse.   You get lightheaded, feel faint, sweaty, or throw up (vomit) blood.  MAKE SURE YOU:   Understand these instructions.   Will watch your condition.   Will get help right away if you are not doing well or get worse.  Document Released: 06/20/2008 Document Revised: 08/31/2011 Document Reviewed: 06/20/2008 [facility] Patient Information 2012 [place], [place].

## 2012-02-01 NOTE — ED Notes (Signed)
Report to amber rn.  Pt remains in ct scan

## 2012-02-01 NOTE — ED Notes (Signed)
Pt c/o dizziness off/on for last month, had nose bleed this am, but has been having off/on for last month.  Pt fell face first off a ladder over 1 month ago and was seen er for at that time. But cont. To have headache/dizziness/nosebleeds.  Pt alert in exam room and denies any cp or sob. No nausea or vomiting

## 2012-02-01 NOTE — ED Notes (Signed)
Pt is aware of a urine sample, pt states he is unable to void at this time.  

## 2012-02-01 NOTE — ED Notes (Signed)
Pt still at MRI

## 2012-12-11 ENCOUNTER — Ambulatory Visit (HOSPITAL_COMMUNITY)
Admission: RE | Admit: 2012-12-11 | Discharge: 2012-12-11 | Disposition: A | Discharge status: Home / Self Care | Payer: PRIVATE HEALTH INSURANCE | Source: Ambulatory Visit | Attending: Family Medicine | Admitting: Family Medicine

## 2012-12-11 ENCOUNTER — Other Ambulatory Visit (HOSPITAL_COMMUNITY): Payer: Self-pay | Admitting: Family Medicine

## 2012-12-11 DIAGNOSIS — M25569 Pain in unspecified knee: Secondary | ICD-10-CM | POA: Insufficient documentation

## 2014-07-08 ENCOUNTER — Other Ambulatory Visit (HOSPITAL_COMMUNITY): Payer: Self-pay | Admitting: Family Medicine

## 2014-07-08 ENCOUNTER — Ambulatory Visit (HOSPITAL_COMMUNITY)
Admission: RE | Admit: 2014-07-08 | Discharge: 2014-07-08 | Disposition: A | Discharge status: Home / Self Care | Payer: PRIVATE HEALTH INSURANCE | Source: Ambulatory Visit | Attending: Family Medicine | Admitting: Family Medicine

## 2014-07-08 DIAGNOSIS — R079 Chest pain, unspecified: Secondary | ICD-10-CM | POA: Insufficient documentation

## 2014-07-08 DIAGNOSIS — R0602 Shortness of breath: Secondary | ICD-10-CM | POA: Insufficient documentation

## 2014-07-08 DIAGNOSIS — R52 Pain, unspecified: Secondary | ICD-10-CM

## 2014-07-08 DIAGNOSIS — R05 Cough: Secondary | ICD-10-CM | POA: Diagnosis not present

## 2014-07-08 DIAGNOSIS — I10 Essential (primary) hypertension: Secondary | ICD-10-CM | POA: Insufficient documentation

## 2015-02-16 ENCOUNTER — Encounter (INDEPENDENT_AMBULATORY_CARE_PROVIDER_SITE_OTHER): Payer: Self-pay | Admitting: *Deleted

## 2015-03-19 ENCOUNTER — Encounter (INDEPENDENT_AMBULATORY_CARE_PROVIDER_SITE_OTHER): Payer: Self-pay

## 2015-03-19 ENCOUNTER — Encounter (INDEPENDENT_AMBULATORY_CARE_PROVIDER_SITE_OTHER): Payer: Self-pay | Admitting: Internal Medicine

## 2015-03-19 ENCOUNTER — Ambulatory Visit (INDEPENDENT_AMBULATORY_CARE_PROVIDER_SITE_OTHER): Payer: Medicare Other | Admitting: Internal Medicine

## 2015-03-19 VITALS — BP 122/72 | HR 52 | Temp 97.8°F | Ht 72.0 in | Wt 175.9 lb

## 2015-03-19 DIAGNOSIS — M109 Gout, unspecified: Secondary | ICD-10-CM | POA: Insufficient documentation

## 2015-03-19 DIAGNOSIS — B1920 Unspecified viral hepatitis C without hepatic coma: Secondary | ICD-10-CM | POA: Insufficient documentation

## 2015-03-19 DIAGNOSIS — K219 Gastro-esophageal reflux disease without esophagitis: Secondary | ICD-10-CM | POA: Diagnosis not present

## 2015-03-19 DIAGNOSIS — E7800 Pure hypercholesterolemia, unspecified: Secondary | ICD-10-CM | POA: Insufficient documentation

## 2015-03-19 DIAGNOSIS — M1000 Idiopathic gout, unspecified site: Secondary | ICD-10-CM

## 2015-03-19 NOTE — Patient Instructions (Signed)
OV in 2 months.  

## 2015-03-19 NOTE — Progress Notes (Signed)
   Subjective:    Patient ID: Don [name], male    DOB: [birth year], 69 y.o.   MRN: [id number]  HPI Referred by Dr. Janna Arch for tx of Hepatitis C. 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  He is retired His appetite is okay. There has been no weight loss. He lives with his sister. BMs are normal. No melena or BRRB. No abdominal pain. No prior hx of ascites. Stay active by mowing and gardening.     Have you ever been treated for Hepatitis C? no Any hx of IV drug abuse or drug abuse? Yes, 6-7 yrs ago.  Are you drinking now? Quit drinking 3-4 yrs ago.  Any hx of etoh abuse? Yes. Do you have tattoos? no Have you ever received a blood transfusion? no When were you diagnosed with Hepatitis C? 2013 Any hx of mental illness requiring treatment? no Do you have suicidal thoughts? no  05/20/2012 Total bili 0.8, Direct 0.3, Indirect 0.5, ALP 80, AST 64, ALT 59, HCV RNA [id number],   Review of Systems Single. No children.       No past surgical history on file.  Past Medical History  Diagnosis Date  . Hypertension   . High cholesterol   . Gout   . Hepatitis C     History reviewed. No pertinent past surgical history.  Allergies  Allergen Reactions  . Penicillins Swelling    Current Outpatient Prescriptions on File Prior to Visit  Medication Sig Dispense Refill  . allopurinol (ZYLOPRIM) 100 MG tablet Take 100 mg by mouth daily.      Marland Kitchen lisinopril (PRINIVIL,ZESTRIL) 10 MG tablet Take 10 mg by mouth daily.      Marland Kitchen omeprazole (PRILOSEC) 20 MG capsule Take 20 mg by mouth 2 (two) times daily.       No current facility-administered medications on file prior to visit.       Objective:   Physical ExamBlood pressure 122/72, pulse 52, temperature 97.8 F (36.6 C), height 6' (1.829 m), weight 175 lb 14.4 oz (79.788 kg).  Alert and oriented. Skin warm and dry. Oral mucosa is moist.   . Sclera anicteric, conjunctivae is  pink. Thyroid not enlarged. No cervical lymphadenopathy. Lungs clear. Heart regular rate and rhythm.  Abdomen is soft. Bowel sounds are positive. No hepatomegaly. No abdominal masses felt. No tenderness.  No edema to lower extremities.         Assessment & Plan:  Hepatitis C. Active. Will treat pending labs and [place].  Hepatic profile, CBC, Hep C antibody, Hep C quaint, Hep C genotype, PT/INR, TSH , [place] Elastrography, Drug screen.  OV in 2 months Rx for Hepatitis A and B vaccine [facility]

## 2015-03-22 LAB — CBC WITH DIFFERENTIAL/PLATELET
BASOS ABS: 0 10*3/uL (ref 0.0–0.1)
Basophils Relative: 0 % (ref 0–1)
EOS ABS: 0.6 10*3/uL (ref 0.0–0.7)
EOS PCT: 9 % — AB (ref 0–5)
HCT: 39.9 % (ref 39.0–52.0)
Hemoglobin: 13.9 g/dL (ref 13.0–17.0)
Lymphocytes Relative: 42 % (ref 12–46)
Lymphs Abs: 2.9 10*3/uL (ref 0.7–4.0)
MCH: 32.3 pg (ref 26.0–34.0)
MCHC: 34.8 g/dL (ref 30.0–36.0)
MCV: 92.8 fL (ref 78.0–100.0)
MPV: 10.4 fL (ref 8.6–12.4)
Monocytes Absolute: 0.4 10*3/uL (ref 0.1–1.0)
Monocytes Relative: 6 % (ref 3–12)
Neutro Abs: 2.9 10*3/uL (ref 1.7–7.7)
Neutrophils Relative %: 43 % (ref 43–77)
PLATELETS: 188 10*3/uL (ref 150–400)
RBC: 4.3 MIL/uL (ref 4.22–5.81)
RDW: 13.6 % (ref 11.5–15.5)
WBC: 6.8 10*3/uL (ref 4.0–10.5)

## 2015-03-22 LAB — HEPATIC FUNCTION PANEL
ALBUMIN: 3.6 g/dL (ref 3.5–5.2)
ALK PHOS: 70 U/L (ref 39–117)
ALT: 31 U/L (ref 0–53)
AST: 32 U/L (ref 0–37)
BILIRUBIN INDIRECT: 0.7 mg/dL (ref 0.2–1.2)
BILIRUBIN TOTAL: 0.9 mg/dL (ref 0.2–1.2)
Bilirubin, Direct: 0.2 mg/dL (ref 0.0–0.3)
Total Protein: 7.2 g/dL (ref 6.0–8.3)

## 2015-03-22 LAB — HEPATITIS C ANTIBODY: HCV Ab: REACTIVE — AB

## 2015-03-22 LAB — TSH: TSH: 1.61 u[IU]/mL (ref 0.350–4.500)

## 2015-03-23 LAB — DRUG SCREEN, URINE
Amphetamine Screen, Ur: NEGATIVE
BARBITURATE QUANT UR: NEGATIVE
Benzodiazepines.: NEGATIVE
CREATININE, U: 139.77 mg/dL
Cocaine Metabolites: NEGATIVE
MARIJUANA METABOLITE: NEGATIVE
Methadone: NEGATIVE
OPIATES: NEGATIVE
PHENCYCLIDINE (PCP): NEGATIVE
PROPOXYPHENE: NEGATIVE

## 2015-03-23 LAB — HEPATITIS C RNA QUANTITATIVE
HCV QUANT: 84129 [IU]/mL — AB (ref <15)
HCV Quantitative Log: 4.92 {Log} — ABNORMAL HIGH (ref <1.18)

## 2015-03-24 ENCOUNTER — Ambulatory Visit (HOSPITAL_COMMUNITY)
Admission: RE | Admit: 2015-03-24 | Discharge: 2015-03-24 | Disposition: A | Discharge status: Home / Self Care | Payer: Medicare Other | Source: Ambulatory Visit | Attending: Internal Medicine | Admitting: Internal Medicine

## 2015-03-24 DIAGNOSIS — M109 Gout, unspecified: Secondary | ICD-10-CM | POA: Diagnosis not present

## 2015-03-24 DIAGNOSIS — Z87891 Personal history of nicotine dependence: Secondary | ICD-10-CM | POA: Insufficient documentation

## 2015-03-24 DIAGNOSIS — I10 Essential (primary) hypertension: Secondary | ICD-10-CM | POA: Diagnosis not present

## 2015-03-24 DIAGNOSIS — E780 Pure hypercholesterolemia: Secondary | ICD-10-CM | POA: Diagnosis not present

## 2015-03-24 DIAGNOSIS — B1920 Unspecified viral hepatitis C without hepatic coma: Secondary | ICD-10-CM | POA: Insufficient documentation

## 2015-03-26 LAB — HEPATITIS C GENOTYPE: HCV GENOTYPE: 1

## 2015-04-15 ENCOUNTER — Telehealth (INDEPENDENT_AMBULATORY_CARE_PROVIDER_SITE_OTHER): Payer: Self-pay | Admitting: Internal Medicine

## 2015-04-15 NOTE — Telephone Encounter (Signed)
Don [name] will treat with Viekira + Ribavirin 1200mg . Medicines needs to be delivered here and sister will have to help patient with these medications. I do not think patient understand.s

## 2015-04-21 NOTE — Telephone Encounter (Signed)
Paper work has been completed to send to [facility] for a PA to be obtained. Once the Clinicals have been released  all information will be faxed to them The patient and his sister will be made aware of the status of this medication as we are.

## 2015-05-03 ENCOUNTER — Telehealth (INDEPENDENT_AMBULATORY_CARE_PROVIDER_SITE_OTHER): Payer: Self-pay | Admitting: *Deleted

## 2015-05-03 NOTE — Telephone Encounter (Signed)
Terri - I have called and spoke to [facility] with [facility]. They are shipping patient's medication to our office and your attention 05/05/15. Patient has been notified and has paid his co-pay.

## 2015-05-04 NOTE — Telephone Encounter (Signed)
noted 

## 2015-05-10 ENCOUNTER — Telehealth (INDEPENDENT_AMBULATORY_CARE_PROVIDER_SITE_OTHER): Payer: Self-pay | Admitting: *Deleted

## 2015-05-10 DIAGNOSIS — B182 Chronic viral hepatitis C: Secondary | ICD-10-CM

## 2015-05-10 NOTE — Telephone Encounter (Signed)
Patient and his sister presented to the office this morning. I went over the [facility] instructions with them. There was a noted in the packet, that stated that the patient should take the Omeprazole with the Harvoni simultaneously . I ask that they call our office in the morning,05/11/15 , to let us know that he did start his medications. This way we can arrange his labs and office visits.

## 2015-05-11 NOTE — Telephone Encounter (Addendum)
Patient started his medication per his sister ,Graciella Belton, on 05/11/15.

## 2015-05-12 NOTE — Telephone Encounter (Addendum)
Per Dorene Ar, Np the patient will need to have lab work in 4 weeks and an OV. Labs are to include: CBC/Diff, Hepatic Profile, Hep C RNA Quant,- Patient will have lab work on 06/09/15. Patient will be made aware.

## 2015-05-12 NOTE — Telephone Encounter (Signed)
Apt has been scheduled for 06/10/15 with Dorene Ar, NP.

## 2015-05-13 NOTE — Telephone Encounter (Signed)
noted 

## 2015-05-18 ENCOUNTER — Encounter (INDEPENDENT_AMBULATORY_CARE_PROVIDER_SITE_OTHER): Payer: Self-pay | Admitting: *Deleted

## 2015-05-18 ENCOUNTER — Other Ambulatory Visit (INDEPENDENT_AMBULATORY_CARE_PROVIDER_SITE_OTHER): Payer: Self-pay | Admitting: *Deleted

## 2015-05-18 DIAGNOSIS — B182 Chronic viral hepatitis C: Secondary | ICD-10-CM

## 2015-06-01 ENCOUNTER — Telehealth (INDEPENDENT_AMBULATORY_CARE_PROVIDER_SITE_OTHER): Payer: Self-pay | Admitting: *Deleted

## 2015-06-01 NOTE — Telephone Encounter (Signed)
Patient started his treatment on 05/11/15. Medication was shipped to our office on 05/08/15.  They are going to send to [place] another shipment in 06/08/15.

## 2015-06-10 ENCOUNTER — Ambulatory Visit (INDEPENDENT_AMBULATORY_CARE_PROVIDER_SITE_OTHER): Payer: Medicare Other | Admitting: Internal Medicine

## 2015-06-10 ENCOUNTER — Encounter (INDEPENDENT_AMBULATORY_CARE_PROVIDER_SITE_OTHER): Payer: Self-pay | Admitting: Internal Medicine

## 2015-06-10 VITALS — BP 126/70 | HR 65 | Temp 98.2°F | Ht 72.0 in | Wt 178.0 lb

## 2015-06-10 DIAGNOSIS — B1710 Acute hepatitis C without hepatic coma: Secondary | ICD-10-CM | POA: Diagnosis not present

## 2015-06-10 LAB — CBC WITH DIFFERENTIAL/PLATELET
BASOS PCT: 0 % (ref 0–1)
Basophils Absolute: 0 10*3/uL (ref 0.0–0.1)
EOS ABS: 0.5 10*3/uL (ref 0.0–0.7)
EOS PCT: 7 % — AB (ref 0–5)
HCT: 36.5 % — ABNORMAL LOW (ref 39.0–52.0)
Hemoglobin: 12.5 g/dL — ABNORMAL LOW (ref 13.0–17.0)
LYMPHS ABS: 3.2 10*3/uL (ref 0.7–4.0)
Lymphocytes Relative: 44 % (ref 12–46)
MCH: 32.9 pg (ref 26.0–34.0)
MCHC: 34.2 g/dL (ref 30.0–36.0)
MCV: 96.1 fL (ref 78.0–100.0)
MONO ABS: 0.4 10*3/uL (ref 0.1–1.0)
MONOS PCT: 5 % (ref 3–12)
MPV: 10.3 fL (ref 8.6–12.4)
Neutro Abs: 3.2 10*3/uL (ref 1.7–7.7)
Neutrophils Relative %: 44 % (ref 43–77)
PLATELETS: 217 10*3/uL (ref 150–400)
RBC: 3.8 MIL/uL — ABNORMAL LOW (ref 4.22–5.81)
RDW: 13.7 % (ref 11.5–15.5)
WBC: 7.2 10*3/uL (ref 4.0–10.5)

## 2015-06-10 LAB — HEPATIC FUNCTION PANEL
ALBUMIN: 3.4 g/dL — AB (ref 3.6–5.1)
ALK PHOS: 58 U/L (ref 40–115)
ALT: 9 U/L (ref 9–46)
AST: 17 U/L (ref 10–35)
Bilirubin, Direct: 0.2 mg/dL (ref <=0.2)
Indirect Bilirubin: 0.4 mg/dL (ref 0.2–1.2)
TOTAL PROTEIN: 6.2 g/dL (ref 6.1–8.1)
Total Bilirubin: 0.6 mg/dL (ref 0.2–1.2)

## 2015-06-10 LAB — HEPATITIS C RNA QUANTITATIVE: HCV QUANT: NOT DETECTED [IU]/mL (ref <15)

## 2015-06-10 NOTE — Progress Notes (Signed)
   Subjective:    Patient ID: Don [name], male    DOB: 08/22/1946, 69 y.o.   MRN: [id number] Sister is in attendance.  HPI  Here today for f/u of his Hepatitis C. [facility] [place]. Hx of IV drug use. Stopped IV drugs 6-7 years ago.  Hx of etoh abuse but has not drank in 3-4 yrs.  Started Harvoni 05/11/2015. This is week 4 for him.  He is doing well.  He denies any rashes. Appetite has been good. Denies any joint pain.  BMs are normal. No melena or BRRB.  He has [facility] the Hep A and B [facility] sister/      Genotype 1, [place] elast F2-F3 Started Harvoni 05/11/2015 Hep C Quaint is pending. CBC    Component Value Date/Time   WBC 7.2 06/09/2015 1248   RBC 3.80* 06/09/2015 1248   HGB 12.5* 06/09/2015 1248   HCT 36.5* 06/09/2015 1248   PLT 217 06/09/2015 1248   MCV 96.1 06/09/2015 1248   MCH 32.9 06/09/2015 1248   MCHC 34.2 06/09/2015 1248   RDW 13.7 06/09/2015 1248   LYMPHSABS 3.2 06/09/2015 1248   MONOABS 0.4 06/09/2015 1248   EOSABS 0.5 06/09/2015 1248   BASOSABS 0.0 06/09/2015 1248    Hepatic Function Panel     Component Value Date/Time   PROT 6.2 06/09/2015 1246   ALBUMIN 3.4* 06/09/2015 1246   AST 17 06/09/2015 1246   ALT 9 06/09/2015 1246   ALKPHOS 58 06/09/2015 1246   BILITOT 0.6 06/09/2015 1246   BILIDIR 0.2 06/09/2015 1246   IBILI 0.4 06/09/2015 1246      Review of Systems Past Medical History  Diagnosis Date  . Hypertension   . High cholesterol   . Gout   . Hepatitis C     No past surgical history on file.  Allergies  Allergen Reactions  . Penicillins Swelling    Current Outpatient Prescriptions on File Prior to Visit  Medication Sig Dispense Refill  . allopurinol (ZYLOPRIM) 100 MG tablet Take 100 mg by mouth daily.      Marland Kitchen lisinopril (PRINIVIL,ZESTRIL) 10 MG tablet Take 10 mg by mouth daily.      Marland Kitchen omeprazole (PRILOSEC) 20 MG capsule Take 20 mg by mouth 2 (two) times daily.      Marland Kitchen oxycodone (OXY-IR) 5 MG capsule Take 5 mg  by mouth every 4 (four) hours as needed.    . pravastatin (PRAVACHOL) 40 MG tablet Take 40 mg by mouth daily.     No current facility-administered medications on file prior to visit.  Harvoni 1 a day.       Objective:   Physical ExamBlood pressure 126/70, pulse 65, temperature 98.2 F (36.8 C), height 6' (1.829 m), weight 178 lb (80.74 kg). Alert and oriented. Skin warm and dry. Oral mucosa is moist.   . Sclera anicteric, conjunctivae is pink. Thyroid not enlarged. No cervical lymphadenopathy. Lungs clear. Heart regular rate and rhythm.  Abdomen is soft. Bowel sounds are positive. No hepatomegaly. No abdominal masses felt. No tenderness.  No edema to lower extremities         Assessment & Plan:  Hepatitis C. He is doing well. Hep C quaint pending. OV in 4 weeks.  Hep C quaint, Hepatic function and CBC with diff in 8 weeks.  Harvoni for next 4 weeks given to patient. (Meds for him are delivered to our office).

## 2015-06-10 NOTE — Patient Instructions (Addendum)
Continue Harvoni. OV in  4 weeks.

## 2015-06-14 ENCOUNTER — Telehealth (INDEPENDENT_AMBULATORY_CARE_PROVIDER_SITE_OTHER): Payer: Self-pay | Admitting: *Deleted

## 2015-06-14 DIAGNOSIS — B182 Chronic viral hepatitis C: Secondary | ICD-10-CM

## 2015-06-14 NOTE — Telephone Encounter (Signed)
.  Per Terri Setzer,NP patient is to have labs in 4 weeks. 

## 2015-06-15 ENCOUNTER — Other Ambulatory Visit (INDEPENDENT_AMBULATORY_CARE_PROVIDER_SITE_OTHER): Payer: Self-pay | Admitting: *Deleted

## 2015-06-15 ENCOUNTER — Encounter (INDEPENDENT_AMBULATORY_CARE_PROVIDER_SITE_OTHER): Payer: Self-pay | Admitting: *Deleted

## 2015-06-15 DIAGNOSIS — B182 Chronic viral hepatitis C: Secondary | ICD-10-CM

## 2015-07-07 LAB — CBC WITH DIFFERENTIAL/PLATELET
BASOS ABS: 0.1 10*3/uL (ref 0.0–0.1)
Basophils Relative: 1 % (ref 0–1)
EOS PCT: 8 % — AB (ref 0–5)
Eosinophils Absolute: 0.6 10*3/uL (ref 0.0–0.7)
HEMATOCRIT: 40.1 % (ref 39.0–52.0)
HEMOGLOBIN: 13.7 g/dL (ref 13.0–17.0)
LYMPHS ABS: 2.8 10*3/uL (ref 0.7–4.0)
LYMPHS PCT: 40 % (ref 12–46)
MCH: 32.9 pg (ref 26.0–34.0)
MCHC: 34.2 g/dL (ref 30.0–36.0)
MCV: 96.4 fL (ref 78.0–100.0)
MONO ABS: 0.3 10*3/uL (ref 0.1–1.0)
MPV: 10.1 fL (ref 8.6–12.4)
Monocytes Relative: 5 % (ref 3–12)
Neutro Abs: 3.2 10*3/uL (ref 1.7–7.7)
Neutrophils Relative %: 46 % (ref 43–77)
Platelets: 204 10*3/uL (ref 150–400)
RBC: 4.16 MIL/uL — ABNORMAL LOW (ref 4.22–5.81)
RDW: 13.5 % (ref 11.5–15.5)
WBC: 6.9 10*3/uL (ref 4.0–10.5)

## 2015-07-08 ENCOUNTER — Telehealth (INDEPENDENT_AMBULATORY_CARE_PROVIDER_SITE_OTHER): Payer: Self-pay | Admitting: *Deleted

## 2015-07-08 ENCOUNTER — Encounter (INDEPENDENT_AMBULATORY_CARE_PROVIDER_SITE_OTHER): Payer: Self-pay | Admitting: Internal Medicine

## 2015-07-08 ENCOUNTER — Ambulatory Visit (INDEPENDENT_AMBULATORY_CARE_PROVIDER_SITE_OTHER): Payer: Medicare Other | Admitting: Internal Medicine

## 2015-07-08 VITALS — BP 118/62 | HR 60 | Temp 98.1°F | Ht 72.0 in | Wt 182.3 lb

## 2015-07-08 DIAGNOSIS — B182 Chronic viral hepatitis C: Secondary | ICD-10-CM

## 2015-07-08 DIAGNOSIS — B1920 Unspecified viral hepatitis C without hepatic coma: Secondary | ICD-10-CM | POA: Diagnosis not present

## 2015-07-08 LAB — HEPATIC FUNCTION PANEL
ALT: 10 U/L (ref 9–46)
AST: 18 U/L (ref 10–35)
Albumin: 3.6 g/dL (ref 3.6–5.1)
Alkaline Phosphatase: 54 U/L (ref 40–115)
BILIRUBIN DIRECT: 0.2 mg/dL (ref <=0.2)
BILIRUBIN INDIRECT: 0.4 mg/dL (ref 0.2–1.2)
Total Bilirubin: 0.6 mg/dL (ref 0.2–1.2)
Total Protein: 6.7 g/dL (ref 6.1–8.1)

## 2015-07-08 NOTE — Progress Notes (Addendum)
   Subjective:    Patient ID: Don [name], male    DOB: September 27, 1945, 69 y.o.   MRN: [id number]  HPI Here today for f/u of his Hepatitis C. Genotype 1. [place] Elast F2-F3. Started Harvoni 05/11/2015.  This is week 8 for him.  He will be treated for 12 weeks. Hep C quaint for this week is pending. States he is doing good. He denies any joint pain. No rashes. Appetite is good. No weight loss. He has actually gained weight since his last visit with me of 4 pounds. No nausea or vomiting. BMs are normal. Usually has a BM daily. No melena or BRRB.    Genotype 1, [place] elast F2-F3 Started Harvoni 05/11/2015 06/11/2015 Cleared virus.   06/09/2015 Hep C quaint undetected.    CBC    Component Value Date/Time   WBC 6.9 07/07/2015 0850   RBC 4.16* 07/07/2015 0850   HGB 13.7 07/07/2015 0850   HCT 40.1 07/07/2015 0850   PLT 204 07/07/2015 0850   MCV 96.4 07/07/2015 0850   MCH 32.9 07/07/2015 0850   MCHC 34.2 07/07/2015 0850   RDW 13.5 07/07/2015 0850   LYMPHSABS 2.8 07/07/2015 0850   MONOABS 0.3 07/07/2015 0850   EOSABS 0.6 07/07/2015 0850   BASOSABS 0.1 07/07/2015 0850   Hepatic Function Panel     Component Value Date/Time   PROT 6.7 07/07/2015 0831   ALBUMIN 3.6 07/07/2015 0831   AST 18 07/07/2015 0831   ALT 10 07/07/2015 0831   ALKPHOS 54 07/07/2015 0831   BILITOT 0.6 07/07/2015 0831   BILIDIR 0.2 07/07/2015 0831   IBILI 0.4 07/07/2015 0831       Review of Systems Past Medical History  Diagnosis Date  . Hypertension   . High cholesterol   . Gout   . Hepatitis C     No past surgical history on file.  Allergies  Allergen Reactions  . Penicillins Swelling    Current Outpatient Prescriptions on File Prior to Visit  Medication Sig Dispense Refill  . allopurinol (ZYLOPRIM) 100 MG tablet Take 100 mg by mouth daily.      . Ledipasvir-Sofosbuvir (HARVONI PO) Take by mouth.    Marland Kitchen lisinopril (PRINIVIL,ZESTRIL) 10 MG tablet Take 10 mg by mouth daily.      Marland Kitchen omeprazole  (PRILOSEC) 20 MG capsule Take 20 mg by mouth 2 (two) times daily.      Marland Kitchen oxycodone (OXY-IR) 5 MG capsule Take 5 mg by mouth every 4 (four) hours as needed.    . pravastatin (PRAVACHOL) 40 MG tablet Take 40 mg by mouth daily.     No current facility-administered medications on file prior to visit.        Objective:   Physical ExamBlood pressure 118/62, pulse 60, temperature 98.1 F (36.7 C), height 6' (1.829 m), weight 182 lb 4.8 oz (82.691 kg). Alert and oriented. Skin warm and dry. Oral mucosa is moist.   . Sclera anicteric, conjunctivae is pink. Thyroid not enlarged. No cervical lymphadenopathy. Lungs clear. Heart regular rate and rhythm.  Abdomen is soft. Bowel sounds are positive. No hepatomegaly. No abdominal masses felt. No tenderness.  No edema to lower extremities.          Assessment & Plan:  Hepatitis C. He is doing well. He will have OV in 8 week with a CBC, Hepatic function and Hep C quaint. No side effects from medications

## 2015-07-08 NOTE — Patient Instructions (Signed)
OV in 8 weeks.  

## 2015-07-08 NOTE — Telephone Encounter (Signed)
.  Per Terri Setzer,NP patient is to have lab work in 8 weeks. 

## 2015-07-09 LAB — HEPATITIS C RNA QUANTITATIVE: HCV Quantitative: NOT DETECTED IU/mL (ref <15)

## 2015-08-18 ENCOUNTER — Encounter (INDEPENDENT_AMBULATORY_CARE_PROVIDER_SITE_OTHER): Payer: Self-pay | Admitting: *Deleted

## 2015-08-18 ENCOUNTER — Other Ambulatory Visit (INDEPENDENT_AMBULATORY_CARE_PROVIDER_SITE_OTHER): Payer: Self-pay | Admitting: *Deleted

## 2015-08-18 DIAGNOSIS — B182 Chronic viral hepatitis C: Secondary | ICD-10-CM

## 2015-09-02 ENCOUNTER — Encounter (INDEPENDENT_AMBULATORY_CARE_PROVIDER_SITE_OTHER): Payer: Self-pay | Admitting: *Deleted

## 2015-09-02 ENCOUNTER — Encounter (INDEPENDENT_AMBULATORY_CARE_PROVIDER_SITE_OTHER): Payer: Self-pay | Admitting: Internal Medicine

## 2015-09-02 ENCOUNTER — Ambulatory Visit (INDEPENDENT_AMBULATORY_CARE_PROVIDER_SITE_OTHER): Payer: Medicare Other | Admitting: Internal Medicine

## 2015-09-02 VITALS — BP 118/72 | HR 60 | Temp 98.4°F | Ht 72.0 in | Wt 186.0 lb

## 2015-09-02 DIAGNOSIS — B1920 Unspecified viral hepatitis C without hepatic coma: Secondary | ICD-10-CM | POA: Diagnosis not present

## 2015-09-02 LAB — CBC WITH DIFFERENTIAL/PLATELET
Basophils Absolute: 0.1 10*3/uL (ref 0.0–0.1)
Basophils Relative: 1 % (ref 0–1)
EOS ABS: 0.6 10*3/uL (ref 0.0–0.7)
EOS PCT: 7 % — AB (ref 0–5)
HEMATOCRIT: 38.1 % — AB (ref 39.0–52.0)
Hemoglobin: 13.6 g/dL (ref 13.0–17.0)
LYMPHS PCT: 37 % (ref 12–46)
Lymphs Abs: 3 10*3/uL (ref 0.7–4.0)
MCH: 33.7 pg (ref 26.0–34.0)
MCHC: 35.7 g/dL (ref 30.0–36.0)
MCV: 94.3 fL (ref 78.0–100.0)
MONO ABS: 0.5 10*3/uL (ref 0.1–1.0)
MPV: 10.7 fL (ref 8.6–12.4)
Monocytes Relative: 6 % (ref 3–12)
Neutro Abs: 3.9 10*3/uL (ref 1.7–7.7)
Neutrophils Relative %: 49 % (ref 43–77)
PLATELETS: 219 10*3/uL (ref 150–400)
RBC: 4.04 MIL/uL — AB (ref 4.22–5.81)
RDW: 14 % (ref 11.5–15.5)
WBC: 8 10*3/uL (ref 4.0–10.5)

## 2015-09-02 LAB — HEPATIC FUNCTION PANEL
ALT: 13 U/L (ref 9–46)
AST: 20 U/L (ref 10–35)
Albumin: 3.4 g/dL — ABNORMAL LOW (ref 3.6–5.1)
Alkaline Phosphatase: 58 U/L (ref 40–115)
BILIRUBIN DIRECT: 0.2 mg/dL (ref <=0.2)
Indirect Bilirubin: 0.4 mg/dL (ref 0.2–1.2)
Total Bilirubin: 0.6 mg/dL (ref 0.2–1.2)
Total Protein: 6.8 g/dL (ref 6.1–8.1)

## 2015-09-02 NOTE — Patient Instructions (Signed)
OV in 6 months. 

## 2015-09-02 NOTE — Progress Notes (Signed)
   Subjective:    Patient ID: Don [name], male    DOB: 02-21-1946, 69 y.o.   MRN: [id number]  HPI Here today for follow up of his Hepatitis C.  Genotype 1. 03/24/2015 [place]. Elast. F2-F3. Last seen 07/08/2015. Started Harvoni 05/11/2015. He cleared the virus. Finished tx in November. Last weight was 182 on 07/08/2015. His appetite is good. He has gained from 182 to 186. No dysphagia.  He has no complaints today.  He is happy his Hep C is cured. No rashes or joint pain. He feels good.  07/07/2015 Hep C quaint: undetected. CBC    Component Value Date/Time   WBC 6.9 07/07/2015 0850   RBC 4.16* 07/07/2015 0850   HGB 13.7 07/07/2015 0850   HCT 40.1 07/07/2015 0850   PLT 204 07/07/2015 0850   MCV 96.4 07/07/2015 0850   MCH 32.9 07/07/2015 0850   MCHC 34.2 07/07/2015 0850   RDW 13.5 07/07/2015 0850   LYMPHSABS 2.8 07/07/2015 0850   MONOABS 0.3 07/07/2015 0850   EOSABS 0.6 07/07/2015 0850   BASOSABS 0.1 07/07/2015 0850    Hepatic Function Panel     Component Value Date/Time   PROT 6.7 07/07/2015 0831   ALBUMIN 3.6 07/07/2015 0831   AST 18 07/07/2015 0831   ALT 10 07/07/2015 0831   ALKPHOS 54 07/07/2015 0831   BILITOT 0.6 07/07/2015 0831   BILIDIR 0.2 07/07/2015 0831   IBILI 0.4 07/07/2015 0831      Review of Systems Past Medical History  Diagnosis Date  . Hypertension   . High cholesterol   . Gout   . Hepatitis C     No past surgical history on file.  Allergies  Allergen Reactions  . Penicillins Swelling    Current Outpatient Prescriptions on File Prior to Visit  Medication Sig Dispense Refill  . allopurinol (ZYLOPRIM) 100 MG tablet Take 100 mg by mouth daily.      Marland Kitchen lisinopril (PRINIVIL,ZESTRIL) 10 MG tablet Take 10 mg by mouth daily.      Marland Kitchen omeprazole (PRILOSEC) 20 MG capsule Take 20 mg by mouth daily.     Marland Kitchen oxycodone (OXY-IR) 5 MG capsule Take 5 mg by mouth every 4 (four) hours as needed.    . pravastatin (PRAVACHOL) 40 MG tablet Take 40 mg by mouth  daily.     No current facility-administered medications on file prior to visit.        Objective:   Physical Exam  Blood pressure 118/72, pulse 60, temperature 98.4 F (36.9 C), height 6' (1.829 m), weight 186 lb (84.369 kg).  Alert and oriented. Skin warm and dry. Oral mucosa is moist.   . Sclera anicteric, conjunctivae is pink. Thyroid not enlarged. No cervical lymphadenopathy. Lungs clear. Heart regular rate and rhythm.  Abdomen is soft. Bowel sounds are positive. No hepatomegaly. No abdominal masses felt. No tenderness. No abdominal distention  No edema to lower extremities.        Assessment & Plan:  Hepatitis C. He has cleared the virus. Will need [place] RUQ for surveillance, CBC, Hepatic function and Hep C quaint.  OV in 6 months.

## 2015-09-06 LAB — HEPATITIS C RNA QUANTITATIVE: HCV Quantitative: NOT DETECTED IU/mL (ref <15)

## 2015-09-07 ENCOUNTER — Ambulatory Visit (HOSPITAL_COMMUNITY)
Admission: RE | Admit: 2015-09-07 | Discharge: 2015-09-07 | Disposition: A | Discharge status: Home / Self Care | Payer: Medicare Other | Source: Ambulatory Visit | Attending: Internal Medicine | Admitting: Internal Medicine

## 2015-09-07 DIAGNOSIS — B1920 Unspecified viral hepatitis C without hepatic coma: Secondary | ICD-10-CM | POA: Insufficient documentation

## 2015-09-07 DIAGNOSIS — R932 Abnormal findings on diagnostic imaging of liver and biliary tract: Secondary | ICD-10-CM | POA: Insufficient documentation

## 2016-03-02 ENCOUNTER — Ambulatory Visit (INDEPENDENT_AMBULATORY_CARE_PROVIDER_SITE_OTHER): Payer: Medicare Other | Admitting: Internal Medicine

## 2016-03-02 ENCOUNTER — Encounter (INDEPENDENT_AMBULATORY_CARE_PROVIDER_SITE_OTHER): Payer: Self-pay | Admitting: *Deleted

## 2016-03-02 ENCOUNTER — Encounter (INDEPENDENT_AMBULATORY_CARE_PROVIDER_SITE_OTHER): Payer: Self-pay | Admitting: Internal Medicine

## 2016-03-02 VITALS — BP 104/60 | HR 56 | Temp 98.0°F | Ht 68.0 in | Wt 182.3 lb

## 2016-03-02 DIAGNOSIS — B1920 Unspecified viral hepatitis C without hepatic coma: Secondary | ICD-10-CM

## 2016-03-02 LAB — HEPATIC FUNCTION PANEL
ALBUMIN: 3.6 g/dL (ref 3.6–5.1)
ALK PHOS: 63 U/L (ref 40–115)
ALT: 15 U/L (ref 9–46)
AST: 21 U/L (ref 10–35)
Bilirubin, Direct: 0.1 mg/dL (ref <=0.2)
Indirect Bilirubin: 0.5 mg/dL (ref 0.2–1.2)
TOTAL PROTEIN: 6.8 g/dL (ref 6.1–8.1)
Total Bilirubin: 0.6 mg/dL (ref 0.2–1.2)

## 2016-03-02 LAB — CBC WITH DIFFERENTIAL/PLATELET
BASOS ABS: 73 {cells}/uL (ref 0–200)
Basophils Relative: 1 %
EOS PCT: 7 %
Eosinophils Absolute: 511 cells/uL — ABNORMAL HIGH (ref 15–500)
HEMATOCRIT: 40.2 % (ref 38.5–50.0)
HEMOGLOBIN: 13.5 g/dL (ref 13.2–17.1)
LYMPHS ABS: 2993 {cells}/uL (ref 850–3900)
LYMPHS PCT: 41 %
MCH: 32.5 pg (ref 27.0–33.0)
MCHC: 33.6 g/dL (ref 32.0–36.0)
MCV: 96.6 fL (ref 80.0–100.0)
MPV: 10.3 fL (ref 7.5–12.5)
Monocytes Absolute: 365 cells/uL (ref 200–950)
Monocytes Relative: 5 %
NEUTROS PCT: 46 %
Neutro Abs: 3358 cells/uL (ref 1500–7800)
Platelets: 215 10*3/uL (ref 140–400)
RBC: 4.16 MIL/uL — ABNORMAL LOW (ref 4.20–5.80)
RDW: 13.9 % (ref 11.0–15.0)
WBC: 7.3 10*3/uL (ref 3.8–10.8)

## 2016-03-02 NOTE — Progress Notes (Signed)
   Subjective:    Patient ID: Don [name], male    DOB: 14-Aug-1946, 70 y.o.   MRN: [id number]  HPI Here today for follow up of his Hepatitis C.  Genotype 1. 03/24/2015 [place]. Elast. F2-F3. Last seen 07/08/2015. Started Harvoni 05/11/2015. He cleared the virus. Finished tx in November of 2016. He tells me he is doing good. He is eating three meals a day. His appetite has remained good.  His weight has remained stable. He walks daily. He has a BM daily No melena or BRRB.    09/07/2015 US abdomen: surveillance: IMPRESSION: Echogenic liver consistent fatty infiltration and/or hepatocellular disease. Portal vein is patent however hepatofugal flow cannot be excluded .   09/02/2015 Hep C quaint undetected.  Plan: US abdomen/ CBC, Hepatic function, CBC  CBC    Component Value Date/Time   WBC 8.0 09/02/2015 0842   RBC 4.04* 09/02/2015 0842   HGB 13.6 09/02/2015 0842   HCT 38.1* 09/02/2015 0842   PLT 219 09/02/2015 0842   MCV 94.3 09/02/2015 0842   MCH 33.7 09/02/2015 0842   MCHC 35.7 09/02/2015 0842   RDW 14.0 09/02/2015 0842   LYMPHSABS 3.0 09/02/2015 0842   MONOABS 0.5 09/02/2015 0842   EOSABS 0.6 09/02/2015 0842   BASOSABS 0.1 09/02/2015 0842    Hepatic Function Panel     Component Value Date/Time   PROT 6.8 09/02/2015 0842   ALBUMIN 3.4* 09/02/2015 0842   AST 20 09/02/2015 0842   ALT 13 09/02/2015 0842   ALKPHOS 58 09/02/2015 0842   BILITOT 0.6 09/02/2015 0842   BILIDIR 0.2 09/02/2015 0842   IBILI 0.4 09/02/2015 0842   Hep C quaint undetected.    Review of Systems Past Medical History  Diagnosis Date  . Hypertension   . High cholesterol   . Gout   . Hepatitis C     No past surgical history on file.  Allergies  Allergen Reactions  . Penicillins Swelling    Current Outpatient Prescriptions on File Prior to Visit  Medication Sig Dispense Refill  . allopurinol (ZYLOPRIM) 100 MG tablet Take 100 mg by mouth daily.      Marland Kitchen lisinopril (PRINIVIL,ZESTRIL) 10 MG  tablet Take 20 mg by mouth daily.     Marland Kitchen omeprazole (PRILOSEC) 20 MG capsule Take 20 mg by mouth daily.     Marland Kitchen oxycodone (OXY-IR) 5 MG capsule Take 5 mg by mouth every 4 (four) hours as needed.    . pravastatin (PRAVACHOL) 40 MG tablet Take 40 mg by mouth daily.     No current facility-administered medications on file prior to visit.        Objective:   Physical Exam Blood pressure 104/60, pulse 56, temperature 98 F (36.7 C), height 5\' 8"  (1.727 m), weight 182 lb 4.8 oz (82.691 kg). Alert and oriented. Skin warm and dry. Oral mucosa is moist.   . Sclera anicteric, conjunctivae is pink. Thyroid not enlarged. No cervical lymphadenopathy. Lungs clear. Heart regular rate and rhythm.  Abdomen is soft. Bowel sounds are positive. No hepatomegaly. No abdominal masses felt. No tenderness.  No edema to lower extremities.        Assessment & Plan:  Hepatitis C: He has cleared the virus. Plan, CBC, Hepatic function, Hep C quaint. AFP US abdomen OV 6 months

## 2016-03-02 NOTE — Patient Instructions (Signed)
OV in 6 months. 

## 2016-03-03 LAB — HEPATITIS C RNA QUANTITATIVE: HCV QUANT: NOT DETECTED [IU]/mL (ref <15)

## 2016-03-03 LAB — AFP TUMOR MARKER: AFP TUMOR MARKER: 2.4 ng/mL (ref <6.1)

## 2016-03-06 ENCOUNTER — Telehealth (INDEPENDENT_AMBULATORY_CARE_PROVIDER_SITE_OTHER): Payer: Self-pay | Admitting: Internal Medicine

## 2016-03-06 NOTE — Telephone Encounter (Signed)
error 

## 2016-03-08 ENCOUNTER — Ambulatory Visit (HOSPITAL_COMMUNITY)
Admission: RE | Admit: 2016-03-08 | Discharge: 2016-03-08 | Disposition: A | Discharge status: Home / Self Care | Payer: Medicare Other | Source: Ambulatory Visit | Attending: Internal Medicine | Admitting: Internal Medicine

## 2016-03-08 DIAGNOSIS — B1920 Unspecified viral hepatitis C without hepatic coma: Secondary | ICD-10-CM | POA: Diagnosis not present

## 2016-03-08 DIAGNOSIS — R932 Abnormal findings on diagnostic imaging of liver and biliary tract: Secondary | ICD-10-CM | POA: Insufficient documentation

## 2016-09-04 ENCOUNTER — Ambulatory Visit (INDEPENDENT_AMBULATORY_CARE_PROVIDER_SITE_OTHER): Payer: Medicare Other | Admitting: Internal Medicine

## 2016-09-04 ENCOUNTER — Encounter (INDEPENDENT_AMBULATORY_CARE_PROVIDER_SITE_OTHER): Payer: Self-pay | Admitting: Internal Medicine

## 2017-12-21 ENCOUNTER — Other Ambulatory Visit (HOSPITAL_COMMUNITY): Payer: Self-pay | Admitting: Family Medicine

## 2017-12-21 ENCOUNTER — Ambulatory Visit (HOSPITAL_COMMUNITY)
Admission: RE | Admit: 2017-12-21 | Discharge: 2017-12-21 | Disposition: A | Discharge status: Home / Self Care | Payer: Medicare Other | Source: Ambulatory Visit | Attending: Family Medicine | Admitting: Family Medicine

## 2017-12-21 DIAGNOSIS — M25561 Pain in right knee: Secondary | ICD-10-CM | POA: Diagnosis not present

## 2017-12-21 DIAGNOSIS — M545 Low back pain: Secondary | ICD-10-CM

## 2017-12-21 DIAGNOSIS — M461 Sacroiliitis, not elsewhere classified: Secondary | ICD-10-CM | POA: Insufficient documentation

## 2017-12-21 DIAGNOSIS — M5430 Sciatica, unspecified side: Secondary | ICD-10-CM | POA: Insufficient documentation

## 2017-12-21 DIAGNOSIS — M170 Bilateral primary osteoarthritis of knee: Secondary | ICD-10-CM | POA: Insufficient documentation

## 2017-12-21 DIAGNOSIS — M5136 Other intervertebral disc degeneration, lumbar region: Secondary | ICD-10-CM | POA: Insufficient documentation

## 2017-12-21 DIAGNOSIS — M25562 Pain in left knee: Secondary | ICD-10-CM | POA: Diagnosis present

## 2019-04-18 ENCOUNTER — Other Ambulatory Visit: Payer: Self-pay

## 2019-04-18 ENCOUNTER — Inpatient Hospital Stay (HOSPITAL_COMMUNITY)
Admission: EM | Admit: 2019-04-18 | Discharge: 2019-04-24 | DRG: 291 | Disposition: A | Discharge status: Home Health | Payer: Medicare Other | Attending: Internal Medicine | Admitting: Internal Medicine

## 2019-04-18 ENCOUNTER — Encounter (HOSPITAL_COMMUNITY): Payer: Self-pay

## 2019-04-18 ENCOUNTER — Emergency Department (HOSPITAL_COMMUNITY): Payer: Medicare Other

## 2019-04-18 DIAGNOSIS — I5023 Acute on chronic systolic (congestive) heart failure: Secondary | ICD-10-CM | POA: Diagnosis not present

## 2019-04-18 DIAGNOSIS — Z20828 Contact with and (suspected) exposure to other viral communicable diseases: Secondary | ICD-10-CM | POA: Diagnosis present

## 2019-04-18 DIAGNOSIS — I10 Essential (primary) hypertension: Secondary | ICD-10-CM | POA: Diagnosis present

## 2019-04-18 DIAGNOSIS — M109 Gout, unspecified: Secondary | ICD-10-CM | POA: Diagnosis present

## 2019-04-18 DIAGNOSIS — Z87891 Personal history of nicotine dependence: Secondary | ICD-10-CM

## 2019-04-18 DIAGNOSIS — D631 Anemia in chronic kidney disease: Secondary | ICD-10-CM | POA: Diagnosis present

## 2019-04-18 DIAGNOSIS — R0603 Acute respiratory distress: Secondary | ICD-10-CM | POA: Diagnosis present

## 2019-04-18 DIAGNOSIS — I5020 Unspecified systolic (congestive) heart failure: Secondary | ICD-10-CM

## 2019-04-18 DIAGNOSIS — R188 Other ascites: Secondary | ICD-10-CM | POA: Diagnosis present

## 2019-04-18 DIAGNOSIS — N183 Chronic kidney disease, stage 3 (moderate): Secondary | ICD-10-CM | POA: Diagnosis present

## 2019-04-18 DIAGNOSIS — R269 Unspecified abnormalities of gait and mobility: Secondary | ICD-10-CM | POA: Diagnosis present

## 2019-04-18 DIAGNOSIS — K7460 Unspecified cirrhosis of liver: Secondary | ICD-10-CM | POA: Diagnosis present

## 2019-04-18 DIAGNOSIS — I509 Heart failure, unspecified: Secondary | ICD-10-CM

## 2019-04-18 DIAGNOSIS — Z7289 Other problems related to lifestyle: Secondary | ICD-10-CM

## 2019-04-18 DIAGNOSIS — I130 Hypertensive heart and chronic kidney disease with heart failure and stage 1 through stage 4 chronic kidney disease, or unspecified chronic kidney disease: Secondary | ICD-10-CM | POA: Diagnosis not present

## 2019-04-18 DIAGNOSIS — D696 Thrombocytopenia, unspecified: Secondary | ICD-10-CM | POA: Diagnosis present

## 2019-04-18 DIAGNOSIS — J90 Pleural effusion, not elsewhere classified: Secondary | ICD-10-CM

## 2019-04-18 DIAGNOSIS — I472 Ventricular tachycardia: Secondary | ICD-10-CM | POA: Diagnosis not present

## 2019-04-18 DIAGNOSIS — K219 Gastro-esophageal reflux disease without esophagitis: Secondary | ICD-10-CM | POA: Diagnosis present

## 2019-04-18 DIAGNOSIS — R0902 Hypoxemia: Secondary | ICD-10-CM | POA: Diagnosis present

## 2019-04-18 DIAGNOSIS — Z79899 Other long term (current) drug therapy: Secondary | ICD-10-CM

## 2019-04-18 DIAGNOSIS — E876 Hypokalemia: Secondary | ICD-10-CM | POA: Diagnosis not present

## 2019-04-18 DIAGNOSIS — E785 Hyperlipidemia, unspecified: Secondary | ICD-10-CM | POA: Diagnosis present

## 2019-04-18 DIAGNOSIS — I429 Cardiomyopathy, unspecified: Secondary | ICD-10-CM | POA: Diagnosis present

## 2019-04-18 DIAGNOSIS — I5032 Chronic diastolic (congestive) heart failure: Secondary | ICD-10-CM

## 2019-04-18 DIAGNOSIS — B1920 Unspecified viral hepatitis C without hepatic coma: Secondary | ICD-10-CM | POA: Diagnosis present

## 2019-04-18 DIAGNOSIS — Z880 Allergy status to penicillin: Secondary | ICD-10-CM

## 2019-04-18 DIAGNOSIS — E7800 Pure hypercholesterolemia, unspecified: Secondary | ICD-10-CM | POA: Diagnosis present

## 2019-04-18 LAB — CBC
HCT: 38.4 % — ABNORMAL LOW (ref 39.0–52.0)
Hemoglobin: 12.3 g/dL — ABNORMAL LOW (ref 13.0–17.0)
MCH: 32.7 pg (ref 26.0–34.0)
MCHC: 32 g/dL (ref 30.0–36.0)
MCV: 102.1 fL — ABNORMAL HIGH (ref 80.0–100.0)
Platelets: 140 10*3/uL — ABNORMAL LOW (ref 150–400)
RBC: 3.76 MIL/uL — ABNORMAL LOW (ref 4.22–5.81)
RDW: 15.7 % — ABNORMAL HIGH (ref 11.5–15.5)
WBC: 3.9 10*3/uL — ABNORMAL LOW (ref 4.0–10.5)
nRBC: 0 % (ref 0.0–0.2)

## 2019-04-18 LAB — PROTIME-INR
INR: 1.4 — ABNORMAL HIGH (ref 0.8–1.2)
Prothrombin Time: 16.5 seconds — ABNORMAL HIGH (ref 11.4–15.2)

## 2019-04-18 MED ORDER — SODIUM CHLORIDE 0.9% FLUSH: 3.0000 mL | Freq: Once | INTRAVENOUS | Status: DC

## 2019-04-18 NOTE — ED Triage Notes (Addendum)
Pt lives with his sister who reports pt has had increased swelling in his legs and abd.  Abd is distended and tight. Pt reports mild sob, states is taking fluid pills at home.  Pt denies pain. Pt also reports he saw his pmd today for same complaints and was given "fluid pills" which he states he took only one so far.

## 2019-04-18 NOTE — ED Provider Notes (Signed)
[facility] EMERGENCY DEPARTMENT Provider Note   CSN: [id number] Arrival date & time: 04/18/19  2231    History   Chief Complaint Chief Complaint  Patient presents with   Shortness of Breath    HPI Don [name] is a 73 y.o. male.     Patient presents to the emergency department for evaluation of swelling.  Patient reports that over the last 2 weeks he has had progressive swelling of his legs followed by swelling of his abdomen.  He feels short of breath.  He states that he saw his primary doctor today and was started on fluid pills, has taken 1 so far but no improvement.  He has not had any fever or cough.  He denies chest pain.     Past Medical History:  Diagnosis Date   Gout    Hepatitis C    High cholesterol    Hypertension     Patient Active Problem List   Diagnosis Date Noted   CHF (congestive heart failure) (HCC) 04/19/2019   Pleural effusion    High cholesterol 03/19/2015   GERD (gastroesophageal reflux disease) 03/19/2015   Gout 03/19/2015   Hepatitis C 03/19/2015    History reviewed. No pertinent surgical history.      Home Medications    Prior to Admission medications   Medication Sig Start Date End Date Taking? Authorizing Provider  albuterol (VENTOLIN HFA) 108 (90 Base) MCG/ACT inhaler Inhale 1-2 puffs into the lungs every 6 (six) hours as needed.  04/08/19  Yes [provider]  allopurinol (ZYLOPRIM) 300 MG tablet Take 300 mg by mouth daily.  03/26/19  Yes [provider]  labetalol (NORMODYNE) 200 MG tablet Take 200 mg by mouth 2 (two) times daily.  03/27/19  Yes [provider]  lisinopril (ZESTRIL) 20 MG tablet Take 20 mg by mouth daily.  03/26/19  Yes [provider]  naproxen (NAPROSYN) 500 MG tablet Take 500 mg by mouth 2 (two) times daily with a meal.  03/26/19  Yes [provider]  omeprazole (PRILOSEC) 20 MG capsule Take 20 mg by mouth daily.    Yes [provider]  oxycodone  (OXY-IR) 5 MG capsule Take 5 mg by mouth every 4 (four) hours as needed for pain.    Yes [provider]  pravastatin (PRAVACHOL) 40 MG tablet Take 40 mg by mouth daily.   Yes [provider]    Family History No family history on file.  Social History Social History   Tobacco Use   Smoking status: Former Smoker   Smokeless tobacco: Never Used   Tobacco comment: years ago  Substance Use Topics   Alcohol use: No    Alcohol/week: 0.0 standard drinks    Comment: none in several years.    Drug use: Yes    Comment: stopped 2-3 yrs ago     Allergies   Penicillins   Review of Systems Review of Systems  Constitutional: Negative for fever.  Respiratory: Positive for shortness of breath.   Cardiovascular: Positive for leg swelling. Negative for chest pain.  Gastrointestinal: Positive for abdominal distention.  All other systems reviewed and are negative.    Physical Exam Updated Vital Signs BP 107/79    Pulse 72    Temp 97.9 F (36.6 C)    Resp 18    Ht 5\' 8"  (1.727 m)    Wt 89.2 kg    SpO2 92%    BMI 29.90 kg/m   Physical Exam Vitals  signs and nursing note reviewed.  Constitutional:      General: He is not in acute distress.    Appearance: Normal appearance. He is well-developed.  HENT:     Head: Normocephalic and atraumatic.     Right Ear: Hearing normal.     Left Ear: Hearing normal.     Nose: Nose normal.  Eyes:     Conjunctiva/sclera: Conjunctivae normal.     Pupils: Pupils are equal, round, and reactive to light.  Neck:     Musculoskeletal: Normal range of motion and neck supple.  Cardiovascular:     Rate and Rhythm: Regular rhythm.     Heart sounds: S1 normal and S2 normal. No murmur. No friction rub. No gallop.   Pulmonary:     Effort: Pulmonary effort is normal. Tachypnea present. No respiratory distress.     Breath sounds: Examination of the right-upper field reveals decreased breath sounds. Examination of the right-middle field  reveals decreased breath sounds. Examination of the right-lower field reveals decreased breath sounds. Decreased breath sounds present.  Chest:     Chest wall: No tenderness.  Abdominal:     General: Bowel sounds are normal.     Palpations: Abdomen is soft.     Tenderness: There is no abdominal tenderness. There is no guarding or rebound. Negative signs include Murphy's sign and McBurney's sign.     Hernia: No hernia is present.  Musculoskeletal: Normal range of motion.     Right lower leg: 2+ Edema present.     Left lower leg: 2+ Edema present.  Skin:    General: Skin is warm and dry.     Findings: No rash.  Neurological:     Mental Status: He is alert and oriented to person, place, and time.     GCS: GCS eye subscore is 4. GCS verbal subscore is 5. GCS motor subscore is 6.     Cranial Nerves: No cranial nerve deficit.     Sensory: No sensory deficit.     Coordination: Coordination normal.  Psychiatric:        Speech: Speech normal.        Behavior: Behavior normal.        Thought Content: Thought content normal.      ED Treatments / Results  Labs (all labs ordered are listed, but only abnormal results are displayed) Labs Reviewed  BASIC METABOLIC PANEL - Abnormal; Notable for the following components:      Result Value   Glucose, Bld 133 (*)    BUN 25 (*)    Creatinine, Ser 1.34 (*)    GFR calc non Af Amer 53 (*)    All other components within normal limits  CBC - Abnormal; Notable for the following components:   WBC 3.9 (*)    RBC 3.76 (*)    Hemoglobin 12.3 (*)    HCT 38.4 (*)    MCV 102.1 (*)    RDW 15.7 (*)    Platelets 140 (*)    All other components within normal limits  HEPATIC FUNCTION PANEL - Abnormal; Notable for the following components:   Albumin 3.3 (*)    Total Bilirubin 2.2 (*)    Bilirubin, Direct 0.9 (*)    Indirect Bilirubin 1.3 (*)    All other components within normal limits  PROTIME-INR - Abnormal; Notable for the following components:    Prothrombin Time 16.5 (*)    INR 1.4 (*)    All other components within normal limits  BRAIN NATRIURETIC PEPTIDE - Abnormal; Notable for the following components:   B Natriuretic Peptide 1,170.0 (*)    All other components within normal limits  CBC - Abnormal; Notable for the following components:   RBC 3.81 (*)    Hemoglobin 12.4 (*)    MCV 102.4 (*)    RDW 15.7 (*)    Platelets 146 (*)    All other components within normal limits  COMPREHENSIVE METABOLIC PANEL - Abnormal; Notable for the following components:   Glucose, Bld 113 (*)    BUN 25 (*)    Creatinine, Ser 1.27 (*)    Calcium 8.7 (*)    Total Protein 6.4 (*)    Albumin 3.1 (*)    Total Bilirubin 2.1 (*)    GFR calc non Af Amer 56 (*)    All other components within normal limits  TROPONIN I (HIGH SENSITIVITY) - Abnormal; Notable for the following components:   Troponin I (High Sensitivity) 82 (*)    All other components within normal limits  TROPONIN I (HIGH SENSITIVITY) - Abnormal; Notable for the following components:   Troponin I (High Sensitivity) 80 (*)    All other components within normal limits  TROPONIN I (HIGH SENSITIVITY) - Abnormal; Notable for the following components:   Troponin I (High Sensitivity) 68 (*)    All other components within normal limits  TROPONIN I (HIGH SENSITIVITY) - Abnormal; Notable for the following components:   Troponin I (High Sensitivity) 74 (*)    All other components within normal limits  SARS CORONAVIRUS 2 (HOSPITAL ORDER, [place] [facility])  MAGNESIUM    EKG EKG Interpretation  Date/Time:  Friday April 18 2019 22:42:19 EDT Ventricular Rate:  72 PR Interval:    QRS Duration: 87 QT Interval:  634 QTC Calculation: 695 R Axis:   -14 Text Interpretation:  Sinus rhythm Borderline low voltage, extremity leads Prolonged QT interval No significant change since last tracing Confirmed by Orpah Greek [phone]) on 04/19/2019 1:07:32 AM   Radiology Ct  Abdomen Pelvis W Contrast  Result Date: 04/19/2019 CLINICAL DATA:  Abdominal distension EXAM: CT ABDOMEN AND PELVIS WITH CONTRAST TECHNIQUE: Multidetector CT imaging of the abdomen and pelvis was performed using the standard protocol following bolus administration of intravenous contrast. CONTRAST:  143mL OMNIPAQUE IOHEXOL 300 MG/ML  SOLN COMPARISON:  None. FINDINGS: Lower chest: There is a small right-sided pleural effusion there is a small to moderate-sized partially visualized left-sided pleural effusion.The heart is significantly enlarged. There is a small to moderate-sized pericardial effusion. There is significant reflux of contrast into the IVC. Hepatobiliary: The liver demonstrates a slightly nodular contour. Normal gallbladder.There is no biliary ductal dilation. Pancreas: Normal contours without ductal dilatation. No peripancreatic fluid collection. Spleen: No splenic laceration or hematoma. Adrenals/Urinary Tract: --Adrenal glands: No adrenal hemorrhage. --Right kidney/ureter: No hydronephrosis or perinephric hematoma. --Left kidney/ureter: No hydronephrosis or perinephric hematoma. --Urinary bladder: Unremarkable. Stomach/Bowel: --Stomach/Duodenum: No hiatal hernia or other gastric abnormality. Normal duodenal course and caliber. --Small bowel: No dilatation or inflammation. --Colon: There are scattered colonic diverticula without CT evidence of diverticulitis. --Appendix: Normal. Vascular/Lymphatic: Atherosclerotic calcification is present within the non-aneurysmal abdominal aorta, without hemodynamically significant stenosis. --No retroperitoneal lymphadenopathy. --No mesenteric lymphadenopathy. --No pelvic or inguinal lymphadenopathy. Reproductive: Unremarkable Other: There is a small volume of abdominal ascites. There is anasarca. Musculoskeletal. Multilevel degenerative disc disease and facet arthrosis. No bony spinal canal stenosis. IMPRESSION: 1. Significant cardiomegaly with reflux of contrast  into the IVC consistent with  underlying cardiac dysfunction. 2. Small right and probable moderate partially visualized left-sided pleural effusions. 3. Small to moderate-sized pericardial effusion. 4. Small volume abdominal ascites. 5. Anasarca. 6. Slightly nodular contour of the liver which can be seen in patients with underlying cirrhosis. Electronically Signed   By: Katherine Mantlehristopher  Green M.D.   On: 04/19/2019 01:44   Dg Chest Portable 1 View  Result Date: 04/18/2019 CLINICAL DATA:  73 year old male with shortness of breath. EXAM: PORTABLE CHEST 1 VIEW COMPARISON:  Chest radiograph dated 07/08/2014 FINDINGS: Small to moderate left pleural effusion with associated left lung base atelectasis. Infiltrate is not excluded. Clinical correlation is recommended. The right lung is clear. There is no pneumothorax. There is apparent cardiomegaly. Evaluation of the heart is however limited as the left cardiac border is silhouetted by the left lung base density. No acute osseous pathology. IMPRESSION: 1. Left pleural effusion with left lung base atelectasis versus infiltrate. 2. Cardiomegaly. Electronically Signed   By: Elgie CollardArash  Radparvar M.D.   On: 04/18/2019 23:30    Procedures Procedures (including critical care time)  Medications Ordered in ED Medications  sodium chloride flush (NS) 0.9 % injection 3 mL (has no administration in time range)  allopurinol (ZYLOPRIM) tablet 300 mg (300 mg Oral Given 04/19/19 1035)  oxyCODONE (Oxy IR/ROXICODONE) immediate release tablet 5 mg (5 mg Oral Given 04/19/19 1036)  labetalol (NORMODYNE) tablet 200 mg (200 mg Oral Given 04/19/19 2301)  lisinopril (ZESTRIL) tablet 20 mg (20 mg Oral Given 04/19/19 1036)  pravastatin (PRAVACHOL) tablet 40 mg (40 mg Oral Given 04/19/19 1035)  pantoprazole (PROTONIX) EC tablet 40 mg (40 mg Oral Given 04/19/19 1036)  enoxaparin (LOVENOX) injection 40 mg (40 mg Subcutaneous Given 04/19/19 1126)  acetaminophen (TYLENOL) tablet 650 mg (has no  administration in time range)    Or  acetaminophen (TYLENOL) suppository 650 mg (has no administration in time range)  ondansetron (ZOFRAN) tablet 4 mg (has no administration in time range)    Or  ondansetron (ZOFRAN) injection 4 mg (has no administration in time range)  sodium chloride flush (NS) 0.9 % injection 3 mL (3 mLs Intravenous Given 04/19/19 1035)  sodium chloride flush (NS) 0.9 % injection 3 mL (has no administration in time range)  0.9 %  sodium chloride infusion (has no administration in time range)  furosemide (LASIX) injection 60 mg (60 mg Intravenous Given 04/19/19 1904)  furosemide (LASIX) injection 40 mg (40 mg Intravenous Given 04/19/19 0114)  iohexol (OMNIPAQUE) 300 MG/ML solution 100 mL (100 mLs Intravenous Contrast Given 04/19/19 0122)  magnesium sulfate IVPB 2 g 50 mL (2 g Intravenous New Bag/Given 04/19/19 0406)     Initial Impression / Assessment and Plan / ED Course  I have reviewed the triage vital signs and the nursing notes.  Pertinent labs & imaging results that were available during my care of the patient were reviewed by me and considered in my medical decision making (see chart for details).        Patient with history of hypertension, high cholesterol and hepatitis C presents to the ER for evaluation of shortness of breath and swelling of his legs and now abdomen.  Symptoms progressing over period of 2 weeks.  He saw his doctor earlier today and apparently was given a prescription for a fluid pill but has only taken 1 dose without any improvement.  Patient mildly short of breath at arrival.  He has 2+ pitting edema of lower extremities up to the abdomen.  Examination revealed absent lung sounds  on the left side and x-ray confirms large effusion.  Will need hospitalization for diuresis and possible thoracentesis if pleural effusion does not resolve with diuretics alone.  Final Clinical Impressions(s) / ED Diagnoses   Final diagnoses:  Pleural effusion     ED Discharge Orders    None       Gilda CreasePollina, Kisa Fujii J, MD 04/20/19 [phone]

## 2019-04-19 ENCOUNTER — Emergency Department (HOSPITAL_COMMUNITY): Payer: Medicare Other

## 2019-04-19 ENCOUNTER — Other Ambulatory Visit: Payer: Self-pay

## 2019-04-19 ENCOUNTER — Encounter (HOSPITAL_COMMUNITY): Payer: Self-pay

## 2019-04-19 ENCOUNTER — Inpatient Hospital Stay (HOSPITAL_COMMUNITY): Payer: Medicare Other

## 2019-04-19 DIAGNOSIS — I509 Heart failure, unspecified: Secondary | ICD-10-CM

## 2019-04-19 DIAGNOSIS — N183 Chronic kidney disease, stage 3 (moderate): Secondary | ICD-10-CM | POA: Diagnosis present

## 2019-04-19 DIAGNOSIS — I361 Nonrheumatic tricuspid (valve) insufficiency: Secondary | ICD-10-CM | POA: Diagnosis not present

## 2019-04-19 DIAGNOSIS — Z87891 Personal history of nicotine dependence: Secondary | ICD-10-CM | POA: Diagnosis not present

## 2019-04-19 DIAGNOSIS — I340 Nonrheumatic mitral (valve) insufficiency: Secondary | ICD-10-CM | POA: Diagnosis not present

## 2019-04-19 DIAGNOSIS — E7800 Pure hypercholesterolemia, unspecified: Secondary | ICD-10-CM | POA: Diagnosis present

## 2019-04-19 DIAGNOSIS — I5023 Acute on chronic systolic (congestive) heart failure: Secondary | ICD-10-CM | POA: Diagnosis present

## 2019-04-19 DIAGNOSIS — I5021 Acute systolic (congestive) heart failure: Secondary | ICD-10-CM | POA: Diagnosis not present

## 2019-04-19 DIAGNOSIS — I130 Hypertensive heart and chronic kidney disease with heart failure and stage 1 through stage 4 chronic kidney disease, or unspecified chronic kidney disease: Secondary | ICD-10-CM | POA: Diagnosis present

## 2019-04-19 DIAGNOSIS — E876 Hypokalemia: Secondary | ICD-10-CM | POA: Diagnosis not present

## 2019-04-19 DIAGNOSIS — R188 Other ascites: Secondary | ICD-10-CM | POA: Diagnosis present

## 2019-04-19 DIAGNOSIS — I429 Cardiomyopathy, unspecified: Secondary | ICD-10-CM | POA: Diagnosis present

## 2019-04-19 DIAGNOSIS — J90 Pleural effusion, not elsewhere classified: Secondary | ICD-10-CM | POA: Diagnosis not present

## 2019-04-19 DIAGNOSIS — E785 Hyperlipidemia, unspecified: Secondary | ICD-10-CM | POA: Diagnosis present

## 2019-04-19 DIAGNOSIS — R0902 Hypoxemia: Secondary | ICD-10-CM | POA: Diagnosis present

## 2019-04-19 DIAGNOSIS — R0603 Acute respiratory distress: Secondary | ICD-10-CM | POA: Diagnosis present

## 2019-04-19 DIAGNOSIS — Z79899 Other long term (current) drug therapy: Secondary | ICD-10-CM | POA: Diagnosis not present

## 2019-04-19 DIAGNOSIS — I5032 Chronic diastolic (congestive) heart failure: Secondary | ICD-10-CM

## 2019-04-19 DIAGNOSIS — Z20828 Contact with and (suspected) exposure to other viral communicable diseases: Secondary | ICD-10-CM | POA: Diagnosis present

## 2019-04-19 DIAGNOSIS — Z7289 Other problems related to lifestyle: Secondary | ICD-10-CM | POA: Diagnosis not present

## 2019-04-19 DIAGNOSIS — M109 Gout, unspecified: Secondary | ICD-10-CM | POA: Diagnosis present

## 2019-04-19 DIAGNOSIS — Z880 Allergy status to penicillin: Secondary | ICD-10-CM | POA: Diagnosis not present

## 2019-04-19 DIAGNOSIS — K7460 Unspecified cirrhosis of liver: Secondary | ICD-10-CM | POA: Diagnosis present

## 2019-04-19 DIAGNOSIS — B1920 Unspecified viral hepatitis C without hepatic coma: Secondary | ICD-10-CM | POA: Diagnosis present

## 2019-04-19 DIAGNOSIS — M1000 Idiopathic gout, unspecified site: Secondary | ICD-10-CM | POA: Diagnosis not present

## 2019-04-19 DIAGNOSIS — I472 Ventricular tachycardia: Secondary | ICD-10-CM | POA: Diagnosis not present

## 2019-04-19 DIAGNOSIS — I5020 Unspecified systolic (congestive) heart failure: Secondary | ICD-10-CM

## 2019-04-19 DIAGNOSIS — D631 Anemia in chronic kidney disease: Secondary | ICD-10-CM | POA: Diagnosis present

## 2019-04-19 DIAGNOSIS — D696 Thrombocytopenia, unspecified: Secondary | ICD-10-CM | POA: Diagnosis present

## 2019-04-19 DIAGNOSIS — K219 Gastro-esophageal reflux disease without esophagitis: Secondary | ICD-10-CM | POA: Diagnosis present

## 2019-04-19 DIAGNOSIS — R269 Unspecified abnormalities of gait and mobility: Secondary | ICD-10-CM | POA: Diagnosis present

## 2019-04-19 LAB — COMPREHENSIVE METABOLIC PANEL
ALT: 21 U/L (ref 0–44)
AST: 31 U/L (ref 15–41)
Albumin: 3.1 g/dL — ABNORMAL LOW (ref 3.5–5.0)
Alkaline Phosphatase: 104 U/L (ref 38–126)
Anion gap: 7 (ref 5–15)
BUN: 25 mg/dL — ABNORMAL HIGH (ref 8–23)
CO2: 27 mmol/L (ref 22–32)
Calcium: 8.7 mg/dL — ABNORMAL LOW (ref 8.9–10.3)
Chloride: 108 mmol/L (ref 98–111)
Creatinine, Ser: 1.27 mg/dL — ABNORMAL HIGH (ref 0.61–1.24)
GFR calc Af Amer: >60 mL/min (ref >60)
GFR calc non Af Amer: 56 mL/min — ABNORMAL LOW (ref >60)
Glucose, Bld: 113 mg/dL — ABNORMAL HIGH (ref 70–99)
Potassium: 3.6 mmol/L (ref 3.5–5.1)
Sodium: 142 mmol/L (ref 135–145)
Total Bilirubin: 2.1 mg/dL — ABNORMAL HIGH (ref 0.3–1.2)
Total Protein: 6.4 g/dL — ABNORMAL LOW (ref 6.5–8.1)

## 2019-04-19 LAB — HEPATIC FUNCTION PANEL
ALT: 22 U/L (ref 0–44)
AST: 36 U/L (ref 15–41)
Albumin: 3.3 g/dL — ABNORMAL LOW (ref 3.5–5.0)
Alkaline Phosphatase: 114 U/L (ref 38–126)
Bilirubin, Direct: 0.9 mg/dL — ABNORMAL HIGH (ref 0.0–0.2)
Indirect Bilirubin: 1.3 mg/dL — ABNORMAL HIGH (ref 0.3–0.9)
Total Bilirubin: 2.2 mg/dL — ABNORMAL HIGH (ref 0.3–1.2)
Total Protein: 6.8 g/dL (ref 6.5–8.1)

## 2019-04-19 LAB — BASIC METABOLIC PANEL
Anion gap: 8 (ref 5–15)
BUN: 25 mg/dL — ABNORMAL HIGH (ref 8–23)
CO2: 26 mmol/L (ref 22–32)
Calcium: 8.9 mg/dL (ref 8.9–10.3)
Chloride: 107 mmol/L (ref 98–111)
Creatinine, Ser: 1.34 mg/dL — ABNORMAL HIGH (ref 0.61–1.24)
GFR calc Af Amer: >60 mL/min (ref >60)
GFR calc non Af Amer: 53 mL/min — ABNORMAL LOW (ref >60)
Glucose, Bld: 133 mg/dL — ABNORMAL HIGH (ref 70–99)
Potassium: 3.6 mmol/L (ref 3.5–5.1)
Sodium: 141 mmol/L (ref 135–145)

## 2019-04-19 LAB — CBC
HCT: 39 % (ref 39.0–52.0)
Hemoglobin: 12.4 g/dL — ABNORMAL LOW (ref 13.0–17.0)
MCH: 32.5 pg (ref 26.0–34.0)
MCHC: 31.8 g/dL (ref 30.0–36.0)
MCV: 102.4 fL — ABNORMAL HIGH (ref 80.0–100.0)
Platelets: 146 10*3/uL — ABNORMAL LOW (ref 150–400)
RBC: 3.81 MIL/uL — ABNORMAL LOW (ref 4.22–5.81)
RDW: 15.7 % — ABNORMAL HIGH (ref 11.5–15.5)
WBC: 4.1 10*3/uL (ref 4.0–10.5)
nRBC: 0 % (ref 0.0–0.2)

## 2019-04-19 LAB — SARS CORONAVIRUS 2 BY RT PCR (HOSPITAL ORDER, PERFORMED IN ~~LOC~~ HOSPITAL LAB): SARS Coronavirus 2: NEGATIVE

## 2019-04-19 LAB — TROPONIN I (HIGH SENSITIVITY)
Troponin I (High Sensitivity): 82 ng/L — ABNORMAL HIGH (ref <18)
Troponin I (High Sensitivity): 80 ng/L — ABNORMAL HIGH (ref <18)
Troponin I (High Sensitivity): 68 ng/L — ABNORMAL HIGH (ref <18)
Troponin I (High Sensitivity): 74 ng/L — ABNORMAL HIGH (ref <18)

## 2019-04-19 LAB — ECHOCARDIOGRAM COMPLETE
Height: 68 in
Weight: 3146.41 oz

## 2019-04-19 LAB — MAGNESIUM: Magnesium: 1.7 mg/dL (ref 1.7–2.4)

## 2019-04-19 LAB — BRAIN NATRIURETIC PEPTIDE: B Natriuretic Peptide: 1170 pg/mL — ABNORMAL HIGH (ref 0.0–100.0)

## 2019-04-19 MED ORDER — MAGNESIUM SULFATE 2 GM/50ML IV SOLN
2.0000 g | Freq: Once | INTRAVENOUS | Status: AC
  Administered 2019-04-19: 2 g via INTRAVENOUS
  Filled 2019-04-19: qty 50

## 2019-04-19 MED ORDER — SODIUM CHLORIDE 0.9 % IV SOLN: 250.0000 mL | INTRAVENOUS | Status: DC | PRN

## 2019-04-19 MED ORDER — ACETAMINOPHEN 650 MG RE SUPP: 650.0000 mg | Freq: Four times a day (QID) | RECTAL | Status: DC | PRN

## 2019-04-19 MED ORDER — ONDANSETRON HCL 4 MG/2ML IJ SOLN: 4.0000 mg | Freq: Four times a day (QID) | INTRAMUSCULAR | Status: DC | PRN

## 2019-04-19 MED ORDER — PRAVASTATIN SODIUM 40 MG PO TABS
40.0000 mg | ORAL_TABLET | Freq: Every day | ORAL | Status: DC
  Administered 2019-04-19 – 2019-04-24 (×6): 40 mg via ORAL
  Filled 2019-04-19 (×6): qty 1

## 2019-04-19 MED ORDER — ALLOPURINOL 300 MG PO TABS
300.0000 mg | ORAL_TABLET | Freq: Every day | ORAL | Status: DC
  Administered 2019-04-19 – 2019-04-24 (×6): 300 mg via ORAL
  Filled 2019-04-19 (×7): qty 1

## 2019-04-19 MED ORDER — PANTOPRAZOLE SODIUM 40 MG PO TBEC
40.0000 mg | DELAYED_RELEASE_TABLET | Freq: Every day | ORAL | Status: DC
  Administered 2019-04-19 – 2019-04-24 (×6): 40 mg via ORAL
  Filled 2019-04-19 (×6): qty 1

## 2019-04-19 MED ORDER — FUROSEMIDE 10 MG/ML IJ SOLN
40.0000 mg | Freq: Once | INTRAMUSCULAR | Status: AC
  Administered 2019-04-19: 40 mg via INTRAVENOUS
  Filled 2019-04-19: qty 4

## 2019-04-19 MED ORDER — SODIUM CHLORIDE 0.9% FLUSH
3.0000 mL | Freq: Two times a day (BID) | INTRAVENOUS | Status: DC
  Administered 2019-04-19 – 2019-04-24 (×9): 3 mL via INTRAVENOUS

## 2019-04-19 MED ORDER — ACETAMINOPHEN 325 MG PO TABS: 650.0000 mg | ORAL_TABLET | Freq: Four times a day (QID) | ORAL | Status: DC | PRN

## 2019-04-19 MED ORDER — ONDANSETRON HCL 4 MG PO TABS: 4.0000 mg | ORAL_TABLET | Freq: Four times a day (QID) | ORAL | Status: DC | PRN

## 2019-04-19 MED ORDER — LABETALOL HCL 200 MG PO TABS
200.0000 mg | ORAL_TABLET | Freq: Two times a day (BID) | ORAL | Status: DC
  Administered 2019-04-19 – 2019-04-20 (×4): 200 mg via ORAL
  Filled 2019-04-19 (×5): qty 1

## 2019-04-19 MED ORDER — ENOXAPARIN SODIUM 40 MG/0.4ML ~~LOC~~ SOLN
40.0000 mg | SUBCUTANEOUS | Status: DC
  Administered 2019-04-19 – 2019-04-24 (×6): 40 mg via SUBCUTANEOUS
  Filled 2019-04-19 (×6): qty 0.4

## 2019-04-19 MED ORDER — OXYCODONE HCL 5 MG PO TABS
5.0000 mg | ORAL_TABLET | ORAL | Status: DC | PRN
  Administered 2019-04-19 – 2019-04-20 (×3): 5 mg via ORAL
  Filled 2019-04-19 (×3): qty 1

## 2019-04-19 MED ORDER — LISINOPRIL 10 MG PO TABS
20.0000 mg | ORAL_TABLET | Freq: Every day | ORAL | Status: DC
  Administered 2019-04-19 – 2019-04-20 (×2): 20 mg via ORAL
  Filled 2019-04-19 (×4): qty 2

## 2019-04-19 MED ORDER — IOHEXOL 300 MG/ML  SOLN
100.0000 mL | Freq: Once | INTRAMUSCULAR | Status: AC | PRN
  Administered 2019-04-19: 100 mL via INTRAVENOUS

## 2019-04-19 MED ORDER — FUROSEMIDE 10 MG/ML IJ SOLN
60.0000 mg | Freq: Two times a day (BID) | INTRAMUSCULAR | Status: AC
  Administered 2019-04-19 – 2019-04-24 (×11): 60 mg via INTRAVENOUS
  Filled 2019-04-19 (×11): qty 6

## 2019-04-19 MED ORDER — SODIUM CHLORIDE 0.9% FLUSH: 3.0000 mL | INTRAVENOUS | Status: DC | PRN

## 2019-04-19 NOTE — Progress Notes (Signed)
*  PRELIMINARY RESULTS* Echocardiogram 2D Echocardiogram has been performed.  Leavy Cella 04/19/2019, 9:03 AM

## 2019-04-19 NOTE — Progress Notes (Signed)
ASSUMPTION OF CARE NOTE   04/19/2019 3:28 PM  Don [name] was seen and examined.  The H&P by the admitting provider, orders, imaging was reviewed.  Please see new orders.  Will continue to follow.    Filed Weights   04/18/19 2248 04/19/19 0407  Weight: 72.6 kg 89.2 kg     Vitals:   04/18/19 2315 04/19/19 0407  BP:  139/90  Pulse: 72 62  Resp: 20 17  Temp:  (!) 97.5 F (36.4 C)  SpO2: 97% 100%    Results for orders placed or performed during the hospital encounter of 04/18/19  SARS Coronavirus 2 (CEPHEID - Performed in [place] hospital lab), [facility] Order   Specimen: Nasopharyngeal Swab  Result Value Ref Range   SARS Coronavirus 2 NEGATIVE NEGATIVE  Basic metabolic panel  Result Value Ref Range   Sodium 141 135 - 145 mmol/L   Potassium 3.6 3.5 - 5.1 mmol/L   Chloride 107 98 - 111 mmol/L   CO2 26 22 - 32 mmol/L   Glucose, Bld 133 (H) 70 - 99 mg/dL   BUN 25 (H) 8 - 23 mg/dL   Creatinine, Ser 1.34 (H) 0.61 - 1.24 mg/dL   Calcium 8.9 8.9 - 10.3 mg/dL   GFR calc non Af Amer 53 (L) >60 mL/min   GFR calc Af Amer >60 >60 mL/min   Anion gap 8 5 - 15  CBC  Result Value Ref Range   WBC 3.9 (L) 4.0 - 10.5 K/uL   RBC 3.76 (L) 4.22 - 5.81 MIL/uL   Hemoglobin 12.3 (L) 13.0 - 17.0 g/dL   HCT 38.4 (L) 39.0 - 52.0 %   MCV 102.1 (H) 80.0 - 100.0 fL   MCH 32.7 26.0 - 34.0 pg   MCHC 32.0 30.0 - 36.0 g/dL   RDW 15.7 (H) 11.5 - 15.5 %   Platelets 140 (L) 150 - 400 K/uL   nRBC 0.0 0.0 - 0.2 %  Hepatic function panel  Result Value Ref Range   Total Protein 6.8 6.5 - 8.1 g/dL   Albumin 3.3 (L) 3.5 - 5.0 g/dL   AST 36 15 - 41 U/L   ALT 22 0 - 44 U/L   Alkaline Phosphatase 114 38 - 126 U/L   Total Bilirubin 2.2 (H) 0.3 - 1.2 mg/dL   Bilirubin, Direct 0.9 (H) 0.0 - 0.2 mg/dL   Indirect Bilirubin 1.3 (H) 0.3 - 0.9 mg/dL  Protime-INR  Result Value Ref Range   Prothrombin Time 16.5 (H) 11.4 - 15.2 seconds   INR 1.4 (H) 0.8 - 1.2  Brain natriuretic peptide  Result Value Ref  Range   B Natriuretic Peptide 1,170.0 (H) 0.0 - 100.0 pg/mL  Magnesium  Result Value Ref Range   Magnesium 1.7 1.7 - 2.4 mg/dL  CBC  Result Value Ref Range   WBC 4.1 4.0 - 10.5 K/uL   RBC 3.81 (L) 4.22 - 5.81 MIL/uL   Hemoglobin 12.4 (L) 13.0 - 17.0 g/dL   HCT 39.0 39.0 - 52.0 %   MCV 102.4 (H) 80.0 - 100.0 fL   MCH 32.5 26.0 - 34.0 pg   MCHC 31.8 30.0 - 36.0 g/dL   RDW 15.7 (H) 11.5 - 15.5 %   Platelets 146 (L) 150 - 400 K/uL   nRBC 0.0 0.0 - 0.2 %  Comprehensive metabolic panel  Result Value Ref Range   Sodium 142 135 - 145 mmol/L   Potassium 3.6 3.5 - 5.1 mmol/L   Chloride 108  98 - 111 mmol/L   CO2 27 22 - 32 mmol/L   Glucose, Bld 113 (H) 70 - 99 mg/dL   BUN 25 (H) 8 - 23 mg/dL   Creatinine, Ser 1.19 (H) 0.61 - 1.24 mg/dL   Calcium 8.7 (L) 8.9 - 10.3 mg/dL   Total Protein 6.4 (L) 6.5 - 8.1 g/dL   Albumin 3.1 (L) 3.5 - 5.0 g/dL   AST 31 15 - 41 U/L   ALT 21 0 - 44 U/L   Alkaline Phosphatase 104 38 - 126 U/L   Total Bilirubin 2.1 (H) 0.3 - 1.2 mg/dL   GFR calc non Af Amer 56 (L) >60 mL/min   GFR calc Af Amer >60 >60 mL/min   Anion gap 7 5 - 15  ECHOCARDIOGRAM COMPLETE  Result Value Ref Range   Weight 3,146.41 oz   Height 68 in   BP 139/90 mmHg  Troponin I (High Sensitivity)  Result Value Ref Range   Troponin I (High Sensitivity) 82 (H) <18 ng/L  Troponin I (High Sensitivity)  Result Value Ref Range   Troponin I (High Sensitivity) 80 (H) <18 ng/L  Troponin I (High Sensitivity)  Result Value Ref Range   Troponin I (High Sensitivity) 68 (H) <18 ng/L  Troponin I (High Sensitivity)  Result Value Ref Range   Troponin I (High Sensitivity) 74 (H) <18 ng/L     C. Laural Benes, MD [facility]   04/18/2019 10:32 PM How to contact the [facility] Attending or Consulting provider 7A - 7P or covering provider during after hours 7P -7A, for this patient?  1. Check the care team in [facility] and look for a) attending/consulting [facility] provider listed and b) the [facility] team listed 2. Log  into [email or web address] and use [place]'s universal password to access. If you do not have the password, please contact the hospital operator. 3. Locate the [facility] provider you are looking for under [facility] and page to a number that you can be directly reached. 4. If you still have difficulty reaching the provider, please page the [facility] (Director on Call) for the Hospitalists listed on amion for assistance.

## 2019-04-19 NOTE — H&P (Addendum)
TRH H&P    Patient Demographics:    Don [name], is a 73 y.o. male  MRN: [id number]  DOB - 12/12/1945  Admit Date - 04/18/2019  Referring MD/NP/PA: Dorthy Coolerhris Paulina  Outpatient Primary MD for the patient is Oval Linseyondiego, Richard, MD  Patient coming from: Home  Chief complaint-shortness of breath   HPI:    Don HarpinLewis [name]  is a 73 y.o. male, with history of hypertension, hepatitis C, hyperlipidemia who came to ED for chief complaint of worsening swelling of the lower extremities and shortness of breath for past 2 weeks.  Patient states that he saw PCP today who prescribed fluid pills and he took 1 pill with no significant improvement.  Patient denies fever or chills.  Also complains of chest pain.  No nausea vomiting or diarrhea.  Patient is a poor historian so history is somewhat limited. Denies abdominal pain or dysuria. Patient complains of dyspnea on exertion In the ED BNP was significantly elevated at 1170.  Troponin is 82 Patient received 40 mg IV Lasix in the ED. CT scan of the abdomen pelvis was done which showed minimal ascites, moderate left pleural effusion.   Review of systems:    In addition to the HPI above,    All other systems reviewed and are negative.    Past History of the following :    Past Medical History:  Diagnosis Date   Gout    Hepatitis C    High cholesterol    Hypertension       History reviewed. No pertinent surgical history.    Social History:      Social History   Tobacco Use   Smoking status: Former Smoker   Smokeless tobacco: Never Used   Tobacco comment: years ago  Substance Use Topics   Alcohol use: No    Alcohol/week: 0.0 standard drinks    Comment: none in several years.        Family History :   Unremarkable   Home Medications:   Prior to Admission medications   Medication Sig Start Date End Date Taking? Authorizing Provider   albuterol (VENTOLIN HFA) 108 (90 Base) MCG/ACT inhaler Inhale 1-2 puffs into the lungs every 6 (six) hours as needed.  04/08/19   [provider]  allopurinol (ZYLOPRIM) 300 MG tablet Take 300 mg by mouth daily.  03/26/19   [provider]  labetalol (NORMODYNE) 200 MG tablet Take 200 mg by mouth 2 (two) times daily.  03/27/19   [provider]  lisinopril (ZESTRIL) 20 MG tablet Take 20 mg by mouth daily.  03/26/19   [provider]  naproxen (NAPROSYN) 500 MG tablet Take 500 mg by mouth 2 (two) times daily with a meal.  03/26/19   [provider]  omeprazole (PRILOSEC) 20 MG capsule Take 20 mg by mouth daily.     [provider]  oxycodone (OXY-IR) 5 MG capsule Take 5 mg by mouth every 4 (four) hours as needed for pain.     [provider]  pravastatin (PRAVACHOL) 40 MG  tablet Take 40 mg by mouth daily.    [provider]     Allergies:     Allergies  Allergen Reactions   Penicillins Swelling     Physical Exam:   Vitals  Blood pressure 118/87, pulse 72, temperature 97.7 F (36.5 C), temperature source Oral, resp. rate 20, height 5\' 8"  (1.727 m), weight 72.6 kg, SpO2 97 %.  1.  General: Appears in no acute distress  2. Psychiatric: Alert, oriented x3, intact insight and judgment  3. Neurologic: Cranial nerves II through XII grossly intact, motor strength 5/5 in all extremities  4. HEENMT:  Atraumatic normocephalic, extraocular muscles are intact  5. Respiratory : Decreased breath sounds bilaterally  6. Cardiovascular : S1-S2, regular, no murmur auscultated, positive JVD, bilateral 2+ pitting edema of the lower extremities  7. Gastrointestinal:  Abdominal soft, nontender, no organomegaly  8. Skin:  No rashes noted      Data Review:    CBC Recent Labs  Lab 04/18/19 2333  WBC 3.9*  HGB 12.3*  HCT 38.4*  PLT 140*  MCV 102.1*  MCH 32.7  MCHC 32.0  RDW 15.7*    ------------------------------------------------------------------------------------------------------------------  Results for orders placed or performed during the hospital encounter of 04/18/19 (from the past 48 hour(s))  Hepatic function panel     Status: Abnormal   Collection Time: 04/18/19 11:28 PM  Result Value Ref Range   Total Protein 6.8 6.5 - 8.1 g/dL   Albumin 3.3 (L) 3.5 - 5.0 g/dL   AST 36 15 - 41 U/L   ALT 22 0 - 44 U/L   Alkaline Phosphatase 114 38 - 126 U/L   Total Bilirubin 2.2 (H) 0.3 - 1.2 mg/dL   Bilirubin, Direct 0.9 (H) 0.0 - 0.2 mg/dL   Indirect Bilirubin 1.3 (H) 0.3 - 0.9 mg/dL    Comment: Performed at [facility], [address], [place], [place] [address]  Protime-INR     Status: Abnormal   Collection Time: 04/18/19 11:28 PM  Result Value Ref Range   Prothrombin Time 16.5 (H) 11.4 - 15.2 seconds   INR 1.4 (H) 0.8 - 1.2    Comment: (NOTE) INR goal varies based on device and disease states. Performed at [facility], [address] [place], [place] [address]   Troponin I (High Sensitivity)     Status: Abnormal   Collection Time: 04/18/19 11:28 PM  Result Value Ref Range   Troponin I (High Sensitivity) 82 (H) <18 ng/L    Comment: (NOTE) Elevated high sensitivity troponin I (hsTnI) values and significant  changes across serial measurements may suggest ACS but many other  chronic and acute conditions are known to elevate hsTnI results.  Refer to the "Links" section for chest pain algorithms and additional  guidance. Performed at [facility], [address] [place], [place] [address]   Brain natriuretic peptide     Status: Abnormal   Collection Time: 04/18/19 11:29 PM  Result Value Ref Range   B Natriuretic Peptide 1,170.0 (H) 0.0 - 100.0 pg/mL    Comment: Performed at [facility], [address], [place], [place] [address]  Basic metabolic panel     Status: Abnormal   Collection Time: 04/18/19 11:33 PM  Result Value Ref Range   Sodium 141 135 -  145 mmol/L   Potassium 3.6 3.5 - 5.1 mmol/L   Chloride 107 98 - 111 mmol/L   CO2 26 22 - 32 mmol/L   Glucose, Bld 133 (H) 70 - 99 mg/dL  BUN 25 (H) 8 - 23 mg/dL   Creatinine, Ser 4.09 (H) 0.61 - 1.24 mg/dL   Calcium 8.9 8.9 - 81.1 mg/dL   GFR calc non Af Amer 53 (L) >60 mL/min   GFR calc Af Amer >60 >60 mL/min   Anion gap 8 5 - 15    Comment: Performed at [facility], [address] [place], [place] [address]  CBC     Status: Abnormal   Collection Time: 04/18/19 11:33 PM  Result Value Ref Range   WBC 3.9 (L) 4.0 - 10.5 K/uL   RBC 3.76 (L) 4.22 - 5.81 MIL/uL   Hemoglobin 12.3 (L) 13.0 - 17.0 g/dL   HCT 29.5 (L) 62.1 - 30.8 %   MCV 102.1 (H) 80.0 - 100.0 fL   MCH 32.7 26.0 - 34.0 pg   MCHC 32.0 30.0 - 36.0 g/dL   RDW 65.7 (H) 84.6 - 96.2 %   Platelets 140 (L) 150 - 400 K/uL   nRBC 0.0 0.0 - 0.2 %    Comment: Performed at [facility], [address] [place], [place] [address]  SARS Coronavirus 2 (CEPHEID - Performed in [facility] hospital lab), Hosp Order     Status: None   Collection Time: 04/18/19 11:46 PM   Specimen: Nasopharyngeal Swab  Result Value Ref Range   SARS Coronavirus 2 NEGATIVE NEGATIVE    Comment: (NOTE) If result is NEGATIVE SARS-CoV-2 target nucleic acids are NOT DETECTED. The SARS-CoV-2 RNA is generally detectable in upper and lower  respiratory specimens during the acute phase of infection. The lowest  concentration of SARS-CoV-2 viral copies this assay can detect is 250  copies / mL. A negative result does not preclude SARS-CoV-2 infection  and should not be used as the sole basis for treatment or other  patient management decisions.  A negative result may occur with  improper specimen collection / handling, submission of specimen other  than nasopharyngeal swab, presence of viral mutation(s) within the  areas targeted by this assay, and inadequate number of viral copies  (<250 copies / mL). A negative result must be combined with clinical   observations, patient history, and epidemiological information. If result is POSITIVE SARS-CoV-2 target nucleic acids are DETECTED. The SARS-CoV-2 RNA is generally detectable in upper and lower  respiratory specimens dur ing the acute phase of infection.  Positive  results are indicative of active infection with SARS-CoV-2.  Clinical  correlation with patient history and other diagnostic information is  necessary to determine patient infection status.  Positive results do  not rule out bacterial infection or co-infection with other viruses. If result is PRESUMPTIVE POSTIVE SARS-CoV-2 nucleic acids MAY BE PRESENT.   A presumptive positive result was obtained on the submitted specimen  and confirmed on repeat testing.  While 2019 novel coronavirus  (SARS-CoV-2) nucleic acids may be present in the submitted sample  additional confirmatory testing may be necessary for epidemiological  and / or clinical management purposes  to differentiate between  SARS-CoV-2 and other Sarbecovirus currently known to infect humans.  If clinically indicated additional testing with an alternate test  methodology [phone]) is advised. The SARS-CoV-2 RNA is generally  detectable in upper and lower respiratory sp ecimens during the acute  phase of infection. The expected result is Negative. Fact Sheet for Patients:  BoilerBrush.com.cy Fact Sheet for Healthcare Providers: https://pope.com/ This test is not yet approved or cleared by the [place] FDA and has been authorized for detection and/or diagnosis of SARS-CoV-2 by  FDA under an Emergency Use Authorization (EUA).  This EUA will remain in effect (meaning this test can be used) for the duration of the COVID-19 declaration under Section 564(b)(1) of the Act, 21 U.S.C. section 360bbb-3(b)(1), unless the authorization is terminated or revoked sooner. Performed at [facility], [address]  [place], [place] [address]   Troponin I (High Sensitivity)     Status: Abnormal   Collection Time: 04/19/19  1:17 AM  Result Value Ref Range   Troponin I (High Sensitivity) 80 (H) <18 ng/L    Comment: (NOTE) Elevated high sensitivity troponin I (hsTnI) values and significant  changes across serial measurements may suggest ACS but many other  chronic and acute conditions are known to elevate hsTnI results.  Refer to the "Links" section for chest pain algorithms and additional  guidance. Performed at [facility], [address] [place], [place] [address]     Chemistries  Recent Labs  Lab 04/18/19 2328 04/18/19 2333  NA  --  141  K  --  3.6  CL  --  107  CO2  --  26  GLUCOSE  --  133*  BUN  --  25*  CREATININE  --  1.34*  CALCIUM  --  8.9  AST 36  --   ALT 22  --   ALKPHOS 114  --   BILITOT 2.2*  --    ------------------------------------------------------------------------------------------------------------------  ------------------------------------------------------------------------------------------------------------------ GFR: Estimated Creatinine Clearance: 48.2 mL/min (A) (by C-G formula based on SCr of 1.34 mg/dL (H)). Liver Function Tests: Recent Labs  Lab 04/18/19 2328  AST 36  ALT 22  ALKPHOS 114  BILITOT 2.2*  PROT 6.8  ALBUMIN 3.3*   No results for input(s): LIPASE, AMYLASE in the last 168 hours. No results for input(s): AMMONIA in the last 168 hours. Coagulation Profile: Recent Labs  Lab 04/18/19 2328  INR 1.4*    --------------------------------------------------------------------------------------------------------------- Urine analysis:    Component Value Date/Time   COLORURINE AMBER (A) 02/01/2012 1300   APPEARANCEUR CLEAR 02/01/2012 1300   LABSPEC 1.015 02/01/2012 1300   PHURINE 6.5 02/01/2012 1300   GLUCOSEU NEGATIVE 02/01/2012 1300   HGBUR NEGATIVE 02/01/2012 1300   BILIRUBINUR SMALL (A) 02/01/2012 1300   KETONESUR NEGATIVE  02/01/2012 1300   PROTEINUR NEGATIVE 02/01/2012 1300   UROBILINOGEN 1.0 02/01/2012 1300   NITRITE NEGATIVE 02/01/2012 1300   LEUKOCYTESUR NEGATIVE 02/01/2012 1300      Imaging Results:    Ct Abdomen Pelvis W Contrast  Result Date: 04/19/2019 CLINICAL DATA:  Abdominal distension EXAM: CT ABDOMEN AND PELVIS WITH CONTRAST TECHNIQUE: Multidetector CT imaging of the abdomen and pelvis was performed using the standard protocol following bolus administration of intravenous contrast. CONTRAST:  195mL OMNIPAQUE IOHEXOL 300 MG/ML  SOLN COMPARISON:  None. FINDINGS: Lower chest: There is a small right-sided pleural effusion there is a small to moderate-sized partially visualized left-sided pleural effusion.The heart is significantly enlarged. There is a small to moderate-sized pericardial effusion. There is significant reflux of contrast into the IVC. Hepatobiliary: The liver demonstrates a slightly nodular contour. Normal gallbladder.There is no biliary ductal dilation. Pancreas: Normal contours without ductal dilatation. No peripancreatic fluid collection. Spleen: No splenic laceration or hematoma. Adrenals/Urinary Tract: --Adrenal glands: No adrenal hemorrhage. --Right kidney/ureter: No hydronephrosis or perinephric hematoma. --Left kidney/ureter: No hydronephrosis or perinephric hematoma. --Urinary bladder: Unremarkable. Stomach/Bowel: --Stomach/Duodenum: No hiatal hernia or other gastric abnormality. Normal duodenal course and caliber. --Small bowel: No dilatation or inflammation. --Colon: There are scattered colonic diverticula without CT evidence of  diverticulitis. --Appendix: Normal. Vascular/Lymphatic: Atherosclerotic calcification is present within the non-aneurysmal abdominal aorta, without hemodynamically significant stenosis. --No retroperitoneal lymphadenopathy. --No mesenteric lymphadenopathy. --No pelvic or inguinal lymphadenopathy. Reproductive: Unremarkable Other: There is a small volume of  abdominal ascites. There is anasarca. Musculoskeletal. Multilevel degenerative disc disease and facet arthrosis. No bony spinal canal stenosis. IMPRESSION: 1. Significant cardiomegaly with reflux of contrast into the IVC consistent with underlying cardiac dysfunction. 2. Small right and probable moderate partially visualized left-sided pleural effusions. 3. Small to moderate-sized pericardial effusion. 4. Small volume abdominal ascites. 5. Anasarca. 6. Slightly nodular contour of the liver which can be seen in patients with underlying cirrhosis. Electronically Signed   By: Katherine Mantlehristopher  Green M.D.   On: 04/19/2019 01:44   Dg Chest Portable 1 View  Result Date: 04/18/2019 CLINICAL DATA:  73 year old male with shortness of breath. EXAM: PORTABLE CHEST 1 VIEW COMPARISON:  Chest radiograph dated 07/08/2014 FINDINGS: Small to moderate left pleural effusion with associated left lung base atelectasis. Infiltrate is not excluded. Clinical correlation is recommended. The right lung is clear. There is no pneumothorax. There is apparent cardiomegaly. Evaluation of the heart is however limited as the left cardiac border is silhouetted by the left lung base density. No acute osseous pathology. IMPRESSION: 1. Left pleural effusion with left lung base atelectasis versus infiltrate. 2. Cardiomegaly. Electronically Signed   By: Elgie CollardArash  Radparvar M.D.   On: 04/18/2019 23:30    My personal review of EKG: Rhythm NSR, QTC 695   Assessment & Plan:    Active Problems:   CHF (congestive heart failure) (HCC)   1. New onset congestive heart failure, unspecified type-patient presenting with bilateral lower extremity swelling, shortness of breath on exertion, BNP elevated at 1170.  Will obtain echocardiogram in a.m.  Patient received 40 mg IV Lasix in the ED.  Will start Lasix 60 mg IV every 12 hours from tomorrow morning.  Strict intake and output.  Daily weights.  Follow BMP in a.m.  2. Hypertension-blood pressure stable,  continue lisinopril, labetalol.  3. Left pleural effusion-likely from underlying CHF.  Started on IV Lasix.  Patient's O2 sats are above 90% on room air.  Will monitor.  4. History of hepatitis C/liver cirrhosis-patient has history of hepatitis C, liver cirrhosis and minimal ascites seen on CT scan.  Will need outpatient GI follow-up.  5. Chest pain-patient also complains of chest pain, initial troponin was 82, repeat 80.  Cycle troponin x2.  EKG shows no ST changes.  6. Acute kidney injury versus CKD stage III-unclear etiology, likely cardiorenal.  Patient has been started on Lasix, follow renal function in a.m.  7. Prolonged QTC-patient has significantly prolonged QTC 695, will obtain stat serum magnesium.  Monitor closely on telemetry.  Repeat EKG in a.m.  Will give 2 g of magnesium sulfate IV x1   DVT Prophylaxis-   Lovenox   AM Labs Ordered, also please review Full Orders  Family Communication: Admission, patients condition and plan of care including tests being ordered have been discussed with the patient  who indicate understanding and agree with the plan and Code Status.  Code Status: Full code  Admission status: Inpatient: Based on patients clinical presentation and evaluation of above clinical data, I have made determination that patient meets Inpatient criteria at this time.  Time spent in minutes : 60 minutes   Meredeth IdeGagan S Cambri Plourde M.D on 04/19/2019 at 2:44 AM

## 2019-04-20 ENCOUNTER — Encounter (HOSPITAL_COMMUNITY): Payer: Self-pay | Admitting: Family Medicine

## 2019-04-20 DIAGNOSIS — E876 Hypokalemia: Secondary | ICD-10-CM

## 2019-04-20 DIAGNOSIS — I5023 Acute on chronic systolic (congestive) heart failure: Secondary | ICD-10-CM

## 2019-04-20 DIAGNOSIS — I10 Essential (primary) hypertension: Secondary | ICD-10-CM | POA: Diagnosis present

## 2019-04-20 LAB — CBC
HCT: 37.2 % — ABNORMAL LOW (ref 39.0–52.0)
Hemoglobin: 12 g/dL — ABNORMAL LOW (ref 13.0–17.0)
MCH: 32.8 pg (ref 26.0–34.0)
MCHC: 32.3 g/dL (ref 30.0–36.0)
MCV: 101.6 fL — ABNORMAL HIGH (ref 80.0–100.0)
Platelets: 167 10*3/uL (ref 150–400)
RBC: 3.66 MIL/uL — ABNORMAL LOW (ref 4.22–5.81)
RDW: 15.5 % (ref 11.5–15.5)
WBC: 4.8 10*3/uL (ref 4.0–10.5)
nRBC: 0 % (ref 0.0–0.2)

## 2019-04-20 LAB — BASIC METABOLIC PANEL
Anion gap: 10 (ref 5–15)
BUN: 22 mg/dL (ref 8–23)
CO2: 28 mmol/L (ref 22–32)
Calcium: 8.8 mg/dL — ABNORMAL LOW (ref 8.9–10.3)
Chloride: 103 mmol/L (ref 98–111)
Creatinine, Ser: 1.4 mg/dL — ABNORMAL HIGH (ref 0.61–1.24)
GFR calc Af Amer: 58 mL/min — ABNORMAL LOW (ref >60)
GFR calc non Af Amer: 50 mL/min — ABNORMAL LOW (ref >60)
Glucose, Bld: 89 mg/dL (ref 70–99)
Potassium: 3.4 mmol/L — ABNORMAL LOW (ref 3.5–5.1)
Sodium: 141 mmol/L (ref 135–145)

## 2019-04-20 LAB — MAGNESIUM: Magnesium: 1.6 mg/dL — ABNORMAL LOW (ref 1.7–2.4)

## 2019-04-20 MED ORDER — MAGNESIUM SULFATE 2 GM/50ML IV SOLN
2.0000 g | Freq: Once | INTRAVENOUS | Status: AC
  Administered 2019-04-20: 2 g via INTRAVENOUS
  Filled 2019-04-20: qty 50

## 2019-04-20 MED ORDER — POTASSIUM CHLORIDE CRYS ER 20 MEQ PO TBCR
40.0000 meq | EXTENDED_RELEASE_TABLET | Freq: Every day | ORAL | Status: DC
  Administered 2019-04-20 – 2019-04-21 (×2): 40 meq via ORAL
  Filled 2019-04-20 (×2): qty 2

## 2019-04-20 NOTE — Progress Notes (Signed)
Patient removed his three wallets from his pants pocket and kept them in his possession.  Sister stated she would come and switch his old clothes for new clothes. Patient wanted to keep his wallets on his person and not to be sent home with families.

## 2019-04-20 NOTE — Progress Notes (Addendum)
PROGRESS NOTE    Don [name]  XBJ:[id number]  DOB: 05/04/1946  DOA: 04/18/2019 PCP: Oval Linseyondiego, Richard, MD   Brief Admission Hx: 73 year old male with hypertension, hyperlipidemia, hepatitis C presented with increasing lower extremity edema and shortness of breath progressive over 2 weeks.  MDM/Assessment & Plan:   1. Acute on chronic systolic congestive heart failure-this is a new diagnosis as the patient does not have a known history of congestive heart failure however he has severely reduced left ventricular systolic function with an EF of 20 to 25%.  He has been started on IV Lasix and has been diuresing.  He reports that he is starting to feel little bit better.  He denies chest pain.  He may be a candidate for entresto but has been on lisinopril.  With newly diagnosed cardiomyopathy, will ask for inpatient cardiology consult.  2. Essential hypertension - continue labetalol, lisinopril.   3. Left pleural effusion - continue diuresis with IV lasix.  Repeat CXR in AM.  4. Hepatitis C / liver cirrhosis - Outpatient GI follow up.  5. Chest pain - secondary to CHF exacerbation, troponin with findings of demand ischemia from CHF exacerbation.  6. CKD stage 3 - likely cardiorenal in setting of severely depressed EF.  Following.  7. Anemia in CKD - Hg stable. Follow.  8. Prolonged QTc - repleting electrolytes, EKG in AM.  9. Hypokalemia / Hypomagnesemia - IV mag replacement, oral potassium replacement.   DVT prophylaxis: lovenox  Code Status: Full  Family Communication: telephone update to sister Disposition Plan: inpatient for IV diuresis   Consultants:  Cardiology 7/27  Procedures:  Echocardiogram  IMPRESSIONS  1. The left ventricle has severely reduced systolic function, with an ejection fraction of 20-25%. The cavity size was normal. There is mildly increased left ventricular wall thickness. Left ventricular diastolic Doppler parameters are indeterminate.  Left  ventricular diffuse hypokinesis.  2. The right ventricle has moderately reduced systolic function. The cavity was moderately enlarged. There is no increase in right ventricular wall thickness. Right ventricular systolic pressure is moderately elevated with an estimated pressure of 57.8  mmHg.  3. Left atrial size was mildly dilated.  4. Right atrial size was severely dilated.  5. Large pleural effusion in the left lateral region.  6. Small pericardial effusion with moderate collection posteriorly.  7. The aortic valve is tricuspid. Mild calcification of the aortic valve. Aortic valve regurgitation is trivial by color flow Doppler. Moderate aortic annular calcification noted.  8. The mitral valve is grossly normal. There is mild mitral regurgitation.  9. The tricuspid valve is grossly normal. Tricuspid valve regurgitation is moderate. 10. The aorta is normal in size and structure. 11. The inferior vena cava was dilated in size with <50% respiratory variability.  Antimicrobials:     Subjective: Pt says he is urinating frequent which is irritating to him but no chest pain, some SOB but overall some better.   Objective: Vitals:   04/19/19 0407 04/19/19 2213 04/20/19 0300 04/20/19 0525  BP: 139/90 107/79  127/87  Pulse: 62 72  79  Resp: 17 18  18   Temp: (!) 97.5 F (36.4 C) 97.9 F (36.6 C)  97.9 F (36.6 C)  TempSrc: Oral   Oral  SpO2: 100% 92%  98%  Weight: 89.2 kg  85.4 kg   Height:        Intake/Output Summary (Last 24 hours) at 04/20/2019 1204 Last data filed at 04/20/2019 1100 Gross per 24 hour  Intake 240 ml  Output 3900 ml  Net -3660 ml   Filed Weights   04/18/19 2248 04/19/19 0407 04/20/19 0300  Weight: 72.6 kg 89.2 kg 85.4 kg     REVIEW OF SYSTEMS  As per history otherwise all reviewed and reported negative  Exam:  General exam: chronically ill appearing male. NAD.  Respiratory system: Clear. No increased work of breathing. Cardiovascular system: S1 & S2  heard. Mild JVD, no murmurs, gallops, clicks.  Bilateral pedal edema. Gastrointestinal system: Abdomen is nondistended, soft and nontender. Normal bowel sounds heard. Central nervous system: Alert and oriented. No focal neurological deficits. Extremities: 2+ pitting pretibial edema BLEs.  Data Reviewed: Basic Metabolic Panel: Recent Labs  Lab 04/18/19 2333 04/19/19 0117 04/19/19 0357 04/20/19 0744  NA 141  --  142 141  K 3.6  --  3.6 3.4*  CL 107  --  108 103  CO2 26  --  27 28  GLUCOSE 133*  --  113* 89  BUN 25*  --  25* 22  CREATININE 1.34*  --  1.27* 1.40*  CALCIUM 8.9  --  8.7* 8.8*  MG  --  1.7  --  1.6*   Liver Function Tests: Recent Labs  Lab 04/18/19 2328 04/19/19 0357  AST 36 31  ALT 22 21  ALKPHOS 114 104  BILITOT 2.2* 2.1*  PROT 6.8 6.4*  ALBUMIN 3.3* 3.1*   No results for input(s): LIPASE, AMYLASE in the last 168 hours. No results for input(s): AMMONIA in the last 168 hours. CBC: Recent Labs  Lab 04/18/19 2333 04/19/19 0357 04/20/19 0744  WBC 3.9* 4.1 4.8  HGB 12.3* 12.4* 12.0*  HCT 38.4* 39.0 37.2*  MCV 102.1* 102.4* 101.6*  PLT 140* 146* 167   Cardiac Enzymes: No results for input(s): CKTOTAL, CKMB, CKMBINDEX, TROPONINI in the last 168 hours. CBG (last 3)  No results for input(s): GLUCAP in the last 72 hours. Recent Results (from the past 240 hour(s))  SARS Coronavirus 2 (CEPHEID - Performed in [facility] hospital lab), Hosp Order     Status: None   Collection Time: 04/18/19 11:46 PM   Specimen: Nasopharyngeal Swab  Result Value Ref Range Status   SARS Coronavirus 2 NEGATIVE NEGATIVE Final    Comment: (NOTE) If result is NEGATIVE SARS-CoV-2 target nucleic acids are NOT DETECTED. The SARS-CoV-2 RNA is generally detectable in upper and lower  respiratory specimens during the acute phase of infection. The lowest  concentration of SARS-CoV-2 viral copies this assay can detect is 250  copies / mL. A negative result does not preclude  SARS-CoV-2 infection  and should not be used as the sole basis for treatment or other  patient management decisions.  A negative result may occur with  improper specimen collection / handling, submission of specimen other  than nasopharyngeal swab, presence of viral mutation(s) within the  areas targeted by this assay, and inadequate number of viral copies  (<250 copies / mL). A negative result must be combined with clinical  observations, patient history, and epidemiological information. If result is POSITIVE SARS-CoV-2 target nucleic acids are DETECTED. The SARS-CoV-2 RNA is generally detectable in upper and lower  respiratory specimens dur ing the acute phase of infection.  Positive  results are indicative of active infection with SARS-CoV-2.  Clinical  correlation with patient history and other diagnostic information is  necessary to determine patient infection status.  Positive results do  not rule out bacterial infection or co-infection with other viruses. If result is PRESUMPTIVE POSTIVE SARS-CoV-2  nucleic acids MAY BE PRESENT.   A presumptive positive result was obtained on the submitted specimen  and confirmed on repeat testing.  While 2019 novel coronavirus  (SARS-CoV-2) nucleic acids may be present in the submitted sample  additional confirmatory testing may be necessary for epidemiological  and / or clinical management purposes  to differentiate between  SARS-CoV-2 and other Sarbecovirus currently known to infect humans.  If clinically indicated additional testing with an alternate test  methodology [phone]) is advised. The SARS-CoV-2 RNA is generally  detectable in upper and lower respiratory sp ecimens during the acute  phase of infection. The expected result is Negative. Fact Sheet for Patients:  StrictlyIdeas.no Fact Sheet for Healthcare Providers: BankingDealers.co.za This test is not yet approved or cleared by the  [place] FDA and has been authorized for detection and/or diagnosis of SARS-CoV-2 by FDA under an Emergency Use Authorization (EUA).  This EUA will remain in effect (meaning this test can be used) for the duration of the COVID-19 declaration under Section 564(b)(1) of the Act, 21 U.S.C. section 360bbb-3(b)(1), unless the authorization is terminated or revoked sooner. Performed at [facility], [address] [place], [place] [address]      Studies: Ct Abdomen Pelvis W Contrast  Result Date: 04/19/2019 CLINICAL DATA:  Abdominal distension EXAM: CT ABDOMEN AND PELVIS WITH CONTRAST TECHNIQUE: Multidetector CT imaging of the abdomen and pelvis was performed using the standard protocol following bolus administration of intravenous contrast. CONTRAST:  134mL OMNIPAQUE IOHEXOL 300 MG/ML  SOLN COMPARISON:  None. FINDINGS: Lower chest: There is a small right-sided pleural effusion there is a small to moderate-sized partially visualized left-sided pleural effusion.The heart is significantly enlarged. There is a small to moderate-sized pericardial effusion. There is significant reflux of contrast into the IVC. Hepatobiliary: The liver demonstrates a slightly nodular contour. Normal gallbladder.There is no biliary ductal dilation. Pancreas: Normal contours without ductal dilatation. No peripancreatic fluid collection. Spleen: No splenic laceration or hematoma. Adrenals/Urinary Tract: --Adrenal glands: No adrenal hemorrhage. --Right kidney/ureter: No hydronephrosis or perinephric hematoma. --Left kidney/ureter: No hydronephrosis or perinephric hematoma. --Urinary bladder: Unremarkable. Stomach/Bowel: --Stomach/Duodenum: No hiatal hernia or other gastric abnormality. Normal duodenal course and caliber. --Small bowel: No dilatation or inflammation. --Colon: There are scattered colonic diverticula without CT evidence of diverticulitis. --Appendix: Normal. Vascular/Lymphatic: Atherosclerotic calcification is  present within the non-aneurysmal abdominal aorta, without hemodynamically significant stenosis. --No retroperitoneal lymphadenopathy. --No mesenteric lymphadenopathy. --No pelvic or inguinal lymphadenopathy. Reproductive: Unremarkable Other: There is a small volume of abdominal ascites. There is anasarca. Musculoskeletal. Multilevel degenerative disc disease and facet arthrosis. No bony spinal canal stenosis. IMPRESSION: 1. Significant cardiomegaly with reflux of contrast into the IVC consistent with underlying cardiac dysfunction. 2. Small right and probable moderate partially visualized left-sided pleural effusions. 3. Small to moderate-sized pericardial effusion. 4. Small volume abdominal ascites. 5. Anasarca. 6. Slightly nodular contour of the liver which can be seen in patients with underlying cirrhosis. Electronically Signed   By: Constance Holster M.D.   On: 04/19/2019 01:44   Dg Chest Portable 1 View  Result Date: 04/18/2019 CLINICAL DATA:  73 year old male with shortness of breath. EXAM: PORTABLE CHEST 1 VIEW COMPARISON:  Chest radiograph dated 07/08/2014 FINDINGS: Small to moderate left pleural effusion with associated left lung base atelectasis. Infiltrate is not excluded. Clinical correlation is recommended. The right lung is clear. There is no pneumothorax. There is apparent cardiomegaly. Evaluation of the heart is however limited as the left cardiac border is silhouetted by the left lung base  density. No acute osseous pathology. IMPRESSION: 1. Left pleural effusion with left lung base atelectasis versus infiltrate. 2. Cardiomegaly. Electronically Signed   By: Elgie CollardArash  Radparvar M.D.   On: 04/18/2019 23:30   Scheduled Meds:  allopurinol  300 mg Oral Daily   enoxaparin (LOVENOX) injection  40 mg Subcutaneous Q24H   furosemide  60 mg Intravenous Q12H   labetalol  200 mg Oral BID   lisinopril  20 mg Oral Daily   pantoprazole  40 mg Oral Daily   potassium chloride  40 mEq Oral Daily    pravastatin  40 mg Oral Daily   sodium chloride flush  3 mL Intravenous Once   sodium chloride flush  3 mL Intravenous Q12H   Continuous Infusions:  sodium chloride     magnesium sulfate bolus IVPB      Active Problems:   CHF (congestive heart failure) (HCC)   Pleural effusion  Time spent:   Standley Dakinslanford Ruchama Kubicek, MD [facility] 04/20/2019, 12:04 PM    LOS: 1 day  How to contact the [facility] Attending or Consulting provider 7A - 7P or covering provider during after hours 7P -7A, for this patient?  1. Check the care team in [facility] and look for a) attending/consulting [facility] provider listed and b) the [facility] team listed 2. Log into [email or web address] and use [place]'s universal password to access. If you do not have the password, please contact the hospital operator. 3. Locate the [facility] provider you are looking for under [facility] and page to a number that you can be directly reached. 4. If you still have difficulty reaching the provider, please page the [facility] (Director on Call) for the Hospitalists listed on amion for assistance.

## 2019-04-21 DIAGNOSIS — E7800 Pure hypercholesterolemia, unspecified: Secondary | ICD-10-CM

## 2019-04-21 DIAGNOSIS — I5021 Acute systolic (congestive) heart failure: Secondary | ICD-10-CM

## 2019-04-21 DIAGNOSIS — I429 Cardiomyopathy, unspecified: Secondary | ICD-10-CM

## 2019-04-21 LAB — BASIC METABOLIC PANEL
Anion gap: 8 (ref 5–15)
BUN: 21 mg/dL (ref 8–23)
CO2: 31 mmol/L (ref 22–32)
Calcium: 8.8 mg/dL — ABNORMAL LOW (ref 8.9–10.3)
Chloride: 102 mmol/L (ref 98–111)
Creatinine, Ser: 1.31 mg/dL — ABNORMAL HIGH (ref 0.61–1.24)
GFR calc Af Amer: >60 mL/min (ref >60)
GFR calc non Af Amer: 54 mL/min — ABNORMAL LOW (ref >60)
Glucose, Bld: 90 mg/dL (ref 70–99)
Potassium: 3.4 mmol/L — ABNORMAL LOW (ref 3.5–5.1)
Sodium: 141 mmol/L (ref 135–145)

## 2019-04-21 LAB — CBC
HCT: 36.1 % — ABNORMAL LOW (ref 39.0–52.0)
Hemoglobin: 11.5 g/dL — ABNORMAL LOW (ref 13.0–17.0)
MCH: 32.3 pg (ref 26.0–34.0)
MCHC: 31.9 g/dL (ref 30.0–36.0)
MCV: 101.4 fL — ABNORMAL HIGH (ref 80.0–100.0)
Platelets: 149 10*3/uL — ABNORMAL LOW (ref 150–400)
RBC: 3.56 MIL/uL — ABNORMAL LOW (ref 4.22–5.81)
RDW: 15.4 % (ref 11.5–15.5)
WBC: 4.3 10*3/uL (ref 4.0–10.5)
nRBC: 0 % (ref 0.0–0.2)

## 2019-04-21 LAB — MAGNESIUM: Magnesium: 1.8 mg/dL (ref 1.7–2.4)

## 2019-04-21 MED ORDER — SACUBITRIL-VALSARTAN 24-26 MG PO TABS
1.0000 | ORAL_TABLET | Freq: Two times a day (BID) | ORAL | Status: DC
  Administered 2019-04-22 – 2019-04-24 (×4): 1 via ORAL
  Filled 2019-04-21 (×4): qty 1

## 2019-04-21 MED ORDER — MAGNESIUM SULFATE 2 GM/50ML IV SOLN
2.0000 g | Freq: Once | INTRAVENOUS | Status: AC
  Administered 2019-04-21: 2 g via INTRAVENOUS
  Filled 2019-04-21: qty 50

## 2019-04-21 MED ORDER — SPIRONOLACTONE 25 MG PO TABS
12.5000 mg | ORAL_TABLET | Freq: Every day | ORAL | Status: DC
  Administered 2019-04-21 – 2019-04-24 (×4): 12.5 mg via ORAL
  Filled 2019-04-21 (×3): qty 0.5
  Filled 2019-04-21 (×2): qty 1
  Filled 2019-04-21: qty 0.5
  Filled 2019-04-21 (×2): qty 1
  Filled 2019-04-21: qty 0.5

## 2019-04-21 MED ORDER — CARVEDILOL 3.125 MG PO TABS
6.2500 mg | ORAL_TABLET | Freq: Two times a day (BID) | ORAL | Status: DC
  Administered 2019-04-21 – 2019-04-24 (×6): 6.25 mg via ORAL
  Filled 2019-04-21 (×6): qty 2

## 2019-04-21 NOTE — Consult Note (Addendum)
Cardiology Consultation:   Patient ID: Don [name] MRN: [id number]; DOB: 04/10/1946  Admit date: 04/18/2019 Date of Consult: 04/21/2019  Primary Care Provider: Oval Linsey, MD Primary Cardiologist: Nona Dell, MD  New Primary Electrophysiologist:  None     Patient Profile:   Don [name] is a 73 y.o. male with a hx of hypertension, HLD, hepatitis C who is being seen today for the evaluation of CHF at the request of Dr. Laural Benes.  History of Present Illness:   Don [name] is a 73 year old male patient with history of hypertension, hyperlipidemia and hepatitis C.  He complained of 2-week history of worsening shortness of breath and lower extremity edema.  BNP elevated at 1170 and 2D echo shows severe LV dysfunction ejection fraction 25% with diffuse hypokinesis.  Troponins are flat at 82.  CT of the abdomen showed ascites and moderate left pleural effusion.  EKG with prolonged QT interval potassium was 3.6 magnesium 1.7.  Today's potassium 3.4 magnesium 1.8.  Patient is poor historian. Says he had leg swelling, short of breath with activity and couldn't lay flat. Also chest pressure in left and right chest-no radiation, palpitations, dizziness or presyncope. Lives with his sister. 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 Quit 5 yrs ago. Remote smoker.  Heart Pathway Score:     Past Medical History:  Diagnosis Date   Gout    Hepatitis C    High cholesterol    Hypertension     History reviewed. No pertinent surgical history.   Home Medications:  Prior to Admission medications   Medication Sig Start Date End Date Taking? Authorizing Provider  albuterol (VENTOLIN HFA) 108 (90 Base) MCG/ACT inhaler Inhale 1-2 puffs into the lungs every 6 (six) hours as needed.  04/08/19  Yes [provider]  allopurinol (ZYLOPRIM) 300 MG tablet Take 300 mg by mouth daily.  03/26/19  Yes [provider]  labetalol (NORMODYNE) 200  MG tablet Take 200 mg by mouth 2 (two) times daily.  03/27/19  Yes [provider]  lisinopril (ZESTRIL) 20 MG tablet Take 20 mg by mouth daily.  03/26/19  Yes [provider]  naproxen (NAPROSYN) 500 MG tablet Take 500 mg by mouth 2 (two) times daily with a meal.  03/26/19  Yes [provider]  omeprazole (PRILOSEC) 20 MG capsule Take 20 mg by mouth daily.    Yes [provider]  oxycodone (OXY-IR) 5 MG capsule Take 5 mg by mouth every 4 (four) hours as needed for pain.    Yes [provider]  pravastatin (PRAVACHOL) 40 MG tablet Take 40 mg by mouth daily.   Yes [provider]    Inpatient Medications: Scheduled Meds:  allopurinol  300 mg Oral Daily   enoxaparin (LOVENOX) injection  40 mg Subcutaneous Q24H   furosemide  60 mg Intravenous Q12H   labetalol  200 mg Oral BID   lisinopril  20 mg Oral Daily   pantoprazole  40 mg Oral Daily   potassium chloride  40 mEq Oral Daily   pravastatin  40 mg Oral Daily   sodium chloride flush  3 mL Intravenous Once   sodium chloride flush  3 mL Intravenous Q12H   Continuous Infusions:  sodium chloride     PRN Meds: sodium chloride, acetaminophen **OR** acetaminophen, ondansetron **OR** ondansetron (ZOFRAN) IV, oxyCODONE, sodium chloride flush  Allergies:    Allergies  Allergen Reactions   Penicillins Swelling  Did it involve swelling of the face/tongue/throat, SOB, or low BP? Unknown Did it involve sudden or severe rash/hives, skin peeling, or any reaction on the inside of your mouth or nose? Unknown Did you need to seek medical attention at a hospital or doctor's office? Unknown When did it last happen? If all above answers are NO, may proceed with cephalosporin use.     Social History:   Social History   Socioeconomic History   Marital status: Single    Spouse name: Not on file   Number of children: Not on file   Years of education: Not on file   Highest  education level: Not on file  Occupational History   Not on file  Social Needs   Financial resource strain: Not on file   Food insecurity    Worry: Not on file    Inability: Not on file   Transportation needs    Medical: Not on file    Non-medical: Not on file  Tobacco Use   Smoking status: Former Smoker   Smokeless tobacco: Never Used   Tobacco comment: years ago  Substance and Sexual Activity   Alcohol use: No    Alcohol/week: 0.0 standard drinks    Comment: none in several years.    Drug use: Yes    Comment: stopped 2-3 yrs ago   Sexual activity: Not on file  Lifestyle   Physical activity    Days per week: Not on file    Minutes per session: Not on file   Stress: Not on file  Relationships   Social connections    Talks on phone: Not on file    Gets together: Not on file    Attends religious service: Not on file    Active member of club or organization: Not on file    Attends meetings of clubs or organizations: Not on file    Relationship status: Not on file   Intimate partner violence    Fear of current or ex partner: Not on file    Emotionally abused: Not on file    Physically abused: Not on file    Forced sexual activity: Not on file  Other Topics Concern   Not on file  Social History Narrative   Not on file    Family History:     History reviewed. No pertinent family history.   ROS:  Please see the history of present illness. Poor historian Review of Systems  Constitution: Negative.  HENT: Negative.   Cardiovascular: Positive for chest pain, dyspnea on exertion, leg swelling and orthopnea.  Respiratory: Negative.   Endocrine: Negative.   Hematologic/Lymphatic: Negative.   Musculoskeletal: Negative.   Gastrointestinal: Negative.   Genitourinary: Negative.   Neurological: Negative.     All other ROS reviewed and negative.     Physical Exam/Data:   Vitals:   04/20/19 0525 04/20/19 1502 04/20/19 2126 04/21/19 0635  BP: 127/87  120/78 119/84 112/77  Pulse: 79 75 77 72  Resp: 18 16 18 18   Temp: 97.9 F (36.6 C) 98.6 F (37 C) 98.4 F (36.9 C) 98.1 F (36.7 C)  TempSrc: Oral Oral Oral Oral  SpO2: 98% 100% 99% 100%  Weight:    82 kg  Height:        Intake/Output Summary (Last 24 hours) at 04/21/2019 0831 Last data filed at 04/21/2019 0814 Gross per 24 hour  Intake 483 ml  Output 3750 ml  Net -3267 ml   Last 3 Weights 04/21/2019 04/20/2019  04/19/2019  Weight (lbs) 180 lb 12.4 oz 188 lb 4.4 oz 196 lb 10.4 oz  Weight (kg) 82 kg 85.4 kg 89.2 kg     Body mass index is 27.49 kg/m.  General:  Well nourished, well developed, in no acute distress  HEENT: normal Lymph: no adenopathy Neck: increased JVD &  HJR Endocrine:  No thryomegaly Vascular: No carotid bruits; FA pulses 2+ bilaterally without bruits  Cardiac:  normal S1, S2; RRR; no murmur distant heart sounds Lungs:  Decreased breath sounds left lung base, clear elsewhere Abd: distended, ascites  Ext: plus 2 edema bilaterally Musculoskeletal:  No deformities, BUE and BLE strength normal and equal Skin: warm and dry lipoma upper back Neuro:  CNs 2-12 intact, no focal abnormalities noted Psych:  flat affect   EKG:  The EKG was personally reviewed and demonstrates: 04/18/2019 normal sinus rhythm with prolonged QT interval, today's EKG normal sinus rhythm with left anterior fascicular block, and proved QT interval Telemetry:  Telemetry was personally reviewed and demonstrates:  NSR with 3-4 beat WCT  Relevant CV Studies:    Echocardiogram 04/19/19 IMPRESSIONS   1. The left ventricle has severely reduced systolic function, with an ejection fraction of 20-25%. The cavity size was normal. There is mildly increased left ventricular wall thickness. Left ventricular diastolic Doppler parameters are indeterminate.  Left ventricular diffuse hypokinesis.  2. The right ventricle has moderately reduced systolic function. The cavity was moderately enlarged. There is no  increase in right ventricular wall thickness. Right ventricular systolic pressure is moderately elevated with an estimated pressure of 57.8  mmHg.  3. Left atrial size was mildly dilated.  4. Right atrial size was severely dilated.  5. Large pleural effusion in the left lateral region.  6. Small pericardial effusion with moderate collection posteriorly.  7. The aortic valve is tricuspid. Mild calcification of the aortic valve. Aortic valve regurgitation is trivial by color flow Doppler. Moderate aortic annular calcification noted.  8. The mitral valve is grossly normal. There is mild mitral regurgitation.  9. The tricuspid valve is grossly normal. Tricuspid valve regurgitation is moderate. 10. The aorta is normal in size and structure. 11. The inferior vena cava was dilated in size with <50% respiratory variability.    Laboratory Data:  High Sensitivity Troponin:   Recent Labs  Lab 04/18/19 2328 04/19/19 0117 04/19/19 0357 04/19/19 1006  TROPONINIHS 82* 80* 68* 74*     Cardiac EnzymesNo results for input(s): TROPONINI in the last 168 hours. No results for input(s): TROPIPOC in the last 168 hours.  Chemistry Recent Labs  Lab 04/19/19 0357 04/20/19 0744 04/21/19 0609  NA 142 141 141  K 3.6 3.4* 3.4*  CL 108 103 102  CO2 27 28 31   GLUCOSE 113* 89 90  BUN 25* 22 21  CREATININE 1.27* 1.40* 1.31*  CALCIUM 8.7* 8.8* 8.8*  GFRNONAA 56* 50* 54*  GFRAA >60 58* >60  ANIONGAP 7 10 8     Recent Labs  Lab 04/18/19 2328 04/19/19 0357  PROT 6.8 6.4*  ALBUMIN 3.3* 3.1*  AST 36 31  ALT 22 21  ALKPHOS 114 104  BILITOT 2.2* 2.1*   Hematology Recent Labs  Lab 04/19/19 0357 04/20/19 0744 04/21/19 0609  WBC 4.1 4.8 4.3  RBC 3.81* 3.66* 3.56*  HGB 12.4* 12.0* 11.5*  HCT 39.0 37.2* 36.1*  MCV 102.4* 101.6* 101.4*  MCH 32.5 32.8 32.3  MCHC 31.8 32.3 31.9  RDW 15.7* 15.5 15.4  PLT 146* 167 149*   BNP  Recent Labs  Lab 04/18/19 2329  BNP 1,170.0*    DDimer No results for  input(s): DDIMER in the last 168 hours.   Radiology/Studies:  Ct Abdomen Pelvis W Contrast  Result Date: 04/19/2019 CLINICAL DATA:  Abdominal distension EXAM: CT ABDOMEN AND PELVIS WITH CONTRAST TECHNIQUE: Multidetector CT imaging of the abdomen and pelvis was performed using the standard protocol following bolus administration of intravenous contrast. CONTRAST:  100mL OMNIPAQUE IOHEXOL 300 MG/ML  SOLN COMPARISON:  None. FINDINGS: Lower chest: There is a small right-sided pleural effusion there is a small to moderate-sized partially visualized left-sided pleural effusion.The heart is significantly enlarged. There is a small to moderate-sized pericardial effusion. There is significant reflux of contrast into the IVC. Hepatobiliary: The liver demonstrates a slightly nodular contour. Normal gallbladder.There is no biliary ductal dilation. Pancreas: Normal contours without ductal dilatation. No peripancreatic fluid collection. Spleen: No splenic laceration or hematoma. Adrenals/Urinary Tract: --Adrenal glands: No adrenal hemorrhage. --Right kidney/ureter: No hydronephrosis or perinephric hematoma. --Left kidney/ureter: No hydronephrosis or perinephric hematoma. --Urinary bladder: Unremarkable. Stomach/Bowel: --Stomach/Duodenum: No hiatal hernia or other gastric abnormality. Normal duodenal course and caliber. --Small bowel: No dilatation or inflammation. --Colon: There are scattered colonic diverticula without CT evidence of diverticulitis. --Appendix: Normal. Vascular/Lymphatic: Atherosclerotic calcification is present within the non-aneurysmal abdominal aorta, without hemodynamically significant stenosis. --No retroperitoneal lymphadenopathy. --No mesenteric lymphadenopathy. --No pelvic or inguinal lymphadenopathy. Reproductive: Unremarkable Other: There is a small volume of abdominal ascites. There is anasarca. Musculoskeletal. Multilevel degenerative disc disease and facet arthrosis. No bony spinal canal  stenosis. IMPRESSION: 1. Significant cardiomegaly with reflux of contrast into the IVC consistent with underlying cardiac dysfunction. 2. Small right and probable moderate partially visualized left-sided pleural effusions. 3. Small to moderate-sized pericardial effusion. 4. Small volume abdominal ascites. 5. Anasarca. 6. Slightly nodular contour of the liver which can be seen in patients with underlying cirrhosis. Electronically Signed   By: Katherine Mantlehristopher  Green M.D.   On: 04/19/2019 01:44   Dg Chest Portable 1 View  Result Date: 04/18/2019 CLINICAL DATA:  73 year old male with shortness of breath. EXAM: PORTABLE CHEST 1 VIEW COMPARISON:  Chest radiograph dated 07/08/2014 FINDINGS: Small to moderate left pleural effusion with associated left lung base atelectasis. Infiltrate is not excluded. Clinical correlation is recommended. The right lung is clear. There is no pneumothorax. There is apparent cardiomegaly. Evaluation of the heart is however limited as the left cardiac border is silhouetted by the left lung base density. No acute osseous pathology. IMPRESSION: 1. Left pleural effusion with left lung base atelectasis versus infiltrate. 2. Cardiomegaly. Electronically Signed   By: Elgie CollardArash  Radparvar M.D.   On: 04/18/2019 23:30    Assessment and Plan:   1. 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 No WMA on echo but did have chest tightness, history of heavy ETOH in past but none 5 yrs. Negative 6 L since admission, 2.6 L past 24 hrs.Still needs diuresis-continue current dose Lasix 60 mg IV BID. Not sure he's a candidate for R/L heart cath at this point.Consider entresto in future 2. Elevated but flat troponins, could do ischemic workup once CHF controlled. 3. Essential hypertension controlled on zestril 20 mg daily 4. Hyperlipidemia on pravachol 5. AKI-crt 1.8? On CKD 6. History of hepatitis C and cirrhosis with  ascites   For questions or updates, please contact [facility] Please consult [email or web address] for contact info under    Signed, Jacolyn ReedyMichele Lenze, PA-C  04/21/2019 8:31  AM   Attending note:  Patient seen and examined.  I reviewed his records and discussed the case with Ms. Geni BersLenze PA-C.  Don [name] presents with at least a 2-week history of reported shortness of breath and worsening leg and scrotal edema.  He is a very difficult historian, does not provide much detail on discussion today.  He was found to have evidence of fluid overload with echocardiogram demonstrating LVEF 20 to 25%, moderate right ventricular dysfunction with moderate pulmonary hypertension. He also have a history of hepatitis C and further documentation of ascites with left pleural effusion.  He has a previous history of fairly significant alcohol abuse, but not for the last 5 years.  On examination this morning he is in no distress.  He is afebrile, systolic is in the 100-120 range, heart rate is in the 70s in sinus rhythm by telemetry which I personally reviewed.  He did have a brief run of NSVT.  He has diuresed about 6 L so far.  Lungs exhibit decreased breath sounds without wheezing.  Cardiac exam reveals RRR with indistinct PMI, no S3, soft systolic murmur.  He has residual leg edema up to the thighs.  Lab work shows potassium 3.4, BUN 21, creatinine 1.31, hemoglobin 11.5, platelets 149, peak high-sensitivity troponin I of 82.  I personally reviewed his ECG which shows sinus rhythm with left anterior fascicular block.  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  Patient is a limited historian, does have a prior history of significant alcohol abuse but not for 5 years.  Cardiac enzymes do not suggest ACS.  He has had clinical improvement with diuresis.  At this point would recommend medical therapy without  aggressive invasive cardiac work-up.  Change labetalol to Coreg, continue IV Lasix at current dose with potassium supplements, continue Pravachol and [facility] appropriate washout.  We will also add low-dose Aldactone.  Follow-up BMET in a.m.  Jonelle SidleSamuel G. Aarush Stukey, M.D., F.A.C.C.

## 2019-04-21 NOTE — Progress Notes (Signed)
PROGRESS NOTE  Don [name] R Stout  ZOX:[id number]  DOB: 03/23/1946  DOA: 04/18/2019 PCP: Oval Linseyondiego, Richard, MD   Brief Admission Hx: 73 year old male with hypertension, hyperlipidemia, hepatitis C presented with increasing lower extremity edema and shortness of breath progressive over 2 weeks.  MDM/Assessment & Plan:   1. Acute on chronic systolic congestive heart failure-this is a new diagnosis as the patient does not have a known history of congestive heart failure however he has severely reduced left ventricular systolic function with an EF of 20 to 25%.  He has been started on IV Lasix and has been diuresing.  He has diuresed 6L since admission.  He is starting to feel better.   He denies chest pain.  He may be a candidate for entresto but has been on lisinopril.  With newly diagnosed cardiomyopathy, will ask for inpatient cardiology consult.  2. Essential hypertension - continue labetalol, lisinopril.   3. Left pleural effusion - continue diuresis with IV lasix.  4. H/o ETOH abuse / Hepatitis C / liver cirrhosis - Outpatient GI follow up.  5. Chest pain - resolved now.  Secondary to CHF exacerbation, troponin with findings of demand ischemia from CHF exacerbation.  6. CKD stage 3 - likely cardiorenal in setting of severely depressed EF.  Following.  7. Anemia in CKD - Hg stable. Follow.  8. Prolonged QTc - repleting electrolytes. Monitor telemetry.   9. Hypokalemia / Hypomagnesemia - IV mag replacement, oral potassium replacement.   DVT prophylaxis: lovenox  Code Status: Full  Family Communication: telephone update to sister Disposition Plan: inpatient for IV diuresis   Consultants:  Cardiology 7/27  Procedures:  Echocardiogram  IMPRESSIONS  1. The left ventricle has severely reduced systolic function, with an ejection fraction of 20-25%. The cavity size was normal. There is mildly increased left ventricular wall thickness. Left ventricular diastolic Doppler parameters are  indeterminate.  Left ventricular diffuse hypokinesis.  2. The right ventricle has moderately reduced systolic function. The cavity was moderately enlarged. There is no increase in right ventricular wall thickness. Right ventricular systolic pressure is moderately elevated with an estimated pressure of 57.8  mmHg.  3. Left atrial size was mildly dilated.  4. Right atrial size was severely dilated.  5. Large pleural effusion in the left lateral region.  6. Small pericardial effusion with moderate collection posteriorly.  7. The aortic valve is tricuspid. Mild calcification of the aortic valve. Aortic valve regurgitation is trivial by color flow Doppler. Moderate aortic annular calcification noted.  8. The mitral valve is grossly normal. There is mild mitral regurgitation.  9. The tricuspid valve is grossly normal. Tricuspid valve regurgitation is moderate. 10. The aorta is normal in size and structure. 11. The inferior vena cava was dilated in size with <50% respiratory variability.  Antimicrobials:     Subjective: Pt says he is starting to feel better. No more chest pain.    Objective: Vitals:   04/20/19 0525 04/20/19 1502 04/20/19 2126 04/21/19 0635  BP: 127/87 120/78 119/84 112/77  Pulse: 79 75 77 72  Resp: 18 16 18 18   Temp: 97.9 F (36.6 C) 98.6 F (37 C) 98.4 F (36.9 C) 98.1 F (36.7 C)  TempSrc: Oral Oral Oral Oral  SpO2: 98% 100% 99% 100%  Weight:    82 kg  Height:        Intake/Output Summary (Last 24 hours) at 04/21/2019 1004 Last data filed at 04/21/2019 0900 Gross per 24 hour  Intake 483 ml  Output 2850 ml  Net -2367 ml   Filed Weights   04/19/19 0407 04/20/19 0300 04/21/19 0635  Weight: 89.2 kg 85.4 kg 82 kg   REVIEW OF SYSTEMS  As per history otherwise all reviewed and reported negative  Exam:  General exam: chronically ill appearing male. NAD.  Respiratory system: Clear. No increased work of breathing. Cardiovascular system: S1 & S2 heard. Mild  JVD, no murmurs, gallops, clicks.  Bilateral pedal edema. Gastrointestinal system: Abdomen is nondistended, soft and nontender. Normal bowel sounds heard. Central nervous system: Alert and oriented. No focal neurological deficits. Extremities: 1-2+ pitting pretibial edema BLEs.  Data Reviewed: Basic Metabolic Panel: Recent Labs  Lab 04/18/19 2333 04/19/19 0117 04/19/19 0357 04/20/19 0744 04/21/19 0609  NA 141  --  142 141 141  K 3.6  --  3.6 3.4* 3.4*  CL 107  --  108 103 102  CO2 26  --  27 28 31   GLUCOSE 133*  --  113* 89 90  BUN 25*  --  25* 22 21  CREATININE 1.34*  --  1.27* 1.40* 1.31*  CALCIUM 8.9  --  8.7* 8.8* 8.8*  MG  --  1.7  --  1.6* 1.8   Liver Function Tests: Recent Labs  Lab 04/18/19 2328 04/19/19 0357  AST 36 31  ALT 22 21  ALKPHOS 114 104  BILITOT 2.2* 2.1*  PROT 6.8 6.4*  ALBUMIN 3.3* 3.1*   No results for input(s): LIPASE, AMYLASE in the last 168 hours. No results for input(s): AMMONIA in the last 168 hours. CBC: Recent Labs  Lab 04/18/19 2333 04/19/19 0357 04/20/19 0744 04/21/19 0609  WBC 3.9* 4.1 4.8 4.3  HGB 12.3* 12.4* 12.0* 11.5*  HCT 38.4* 39.0 37.2* 36.1*  MCV 102.1* 102.4* 101.6* 101.4*  PLT 140* 146* 167 149*   Cardiac Enzymes: No results for input(s): CKTOTAL, CKMB, CKMBINDEX, TROPONINI in the last 168 hours. CBG (last 3)  No results for input(s): GLUCAP in the last 72 hours. Recent Results (from the past 240 hour(s))  SARS Coronavirus 2 (CEPHEID - Performed in [facility] hospital lab), Hosp Order     Status: None   Collection Time: 04/18/19 11:46 PM   Specimen: Nasopharyngeal Swab  Result Value Ref Range Status   SARS Coronavirus 2 NEGATIVE NEGATIVE Final    Comment: (NOTE) If result is NEGATIVE SARS-CoV-2 target nucleic acids are NOT DETECTED. The SARS-CoV-2 RNA is generally detectable in upper and lower  respiratory specimens during the acute phase of infection. The lowest  concentration of SARS-CoV-2 viral copies this  assay can detect is 250  copies / mL. A negative result does not preclude SARS-CoV-2 infection  and should not be used as the sole basis for treatment or other  patient management decisions.  A negative result may occur with  improper specimen collection / handling, submission of specimen other  than nasopharyngeal swab, presence of viral mutation(s) within the  areas targeted by this assay, and inadequate number of viral copies  (<250 copies / mL). A negative result must be combined with clinical  observations, patient history, and epidemiological information. If result is POSITIVE SARS-CoV-2 target nucleic acids are DETECTED. The SARS-CoV-2 RNA is generally detectable in upper and lower  respiratory specimens dur ing the acute phase of infection.  Positive  results are indicative of active infection with SARS-CoV-2.  Clinical  correlation with patient history and other diagnostic information is  necessary to determine patient infection status.  Positive results do  not rule out bacterial  infection or co-infection with other viruses. If result is PRESUMPTIVE POSTIVE SARS-CoV-2 nucleic acids MAY BE PRESENT.   A presumptive positive result was obtained on the submitted specimen  and confirmed on repeat testing.  While 2019 novel coronavirus  (SARS-CoV-2) nucleic acids may be present in the submitted sample  additional confirmatory testing may be necessary for epidemiological  and / or clinical management purposes  to differentiate between  SARS-CoV-2 and other Sarbecovirus currently known to infect humans.  If clinically indicated additional testing with an alternate test  methodology [phone]) is advised. The SARS-CoV-2 RNA is generally  detectable in upper and lower respiratory sp ecimens during the acute  phase of infection. The expected result is Negative. Fact Sheet for Patients:  StrictlyIdeas.no Fact Sheet for Healthcare Providers:  BankingDealers.co.za This test is not yet approved or cleared by the [place] FDA and has been authorized for detection and/or diagnosis of SARS-CoV-2 by FDA under an Emergency Use Authorization (EUA).  This EUA will remain in effect (meaning this test can be used) for the duration of the COVID-19 declaration under Section 564(b)(1) of the Act, 21 U.S.C. section 360bbb-3(b)(1), unless the authorization is terminated or revoked sooner. Performed at [facility], [address] [place], [place] [address]      Studies: No results found. Scheduled Meds: . allopurinol  300 mg Oral Daily  . carvedilol  6.25 mg Oral BID WC  . enoxaparin (LOVENOX) injection  40 mg Subcutaneous Q24H  . furosemide  60 mg Intravenous Q12H  . pantoprazole  40 mg Oral Daily  . potassium chloride  40 mEq Oral Daily  . pravastatin  40 mg Oral Daily  . [START ON 04/22/2019] sacubitril-valsartan  1 tablet Oral BID  . sodium chloride flush  3 mL Intravenous Once  . sodium chloride flush  3 mL Intravenous Q12H  . spironolactone  12.5 mg Oral Daily   Continuous Infusions: . sodium chloride    . magnesium sulfate bolus IVPB      Active Problems:   High cholesterol   GERD (gastroesophageal reflux disease)   Gout   Hepatitis C   CHF (congestive heart failure) (HCC)   Pleural effusion   Hypertension  Time spent:   Irwin Brakeman, MD [facility] 04/21/2019, 10:04 AM    LOS: 2 days  How to contact the [facility] Attending or Consulting provider [place] or covering provider during after hours [place], for this patient?  1. Check the care team in [facility] and look for a) attending/consulting [facility] provider listed and b) the [facility] team listed 2. Log into [email or web address] and use [place]'s universal password to access. If you do not have the password, please contact the hospital operator. 3. Locate the [facility] provider you are looking for under [facility] and page to a number that you can  be directly reached. 4. If you still have difficulty reaching the provider, please page the [facility] (Director on Call) for the Hospitalists listed on amion for assistance.

## 2019-04-22 DIAGNOSIS — I10 Essential (primary) hypertension: Secondary | ICD-10-CM

## 2019-04-22 DIAGNOSIS — B1920 Unspecified viral hepatitis C without hepatic coma: Secondary | ICD-10-CM

## 2019-04-22 DIAGNOSIS — K219 Gastro-esophageal reflux disease without esophagitis: Secondary | ICD-10-CM

## 2019-04-22 LAB — MAGNESIUM: Magnesium: 1.8 mg/dL (ref 1.7–2.4)

## 2019-04-22 LAB — BASIC METABOLIC PANEL
Anion gap: 8 (ref 5–15)
BUN: 19 mg/dL (ref 8–23)
CO2: 29 mmol/L (ref 22–32)
Calcium: 8.3 mg/dL — ABNORMAL LOW (ref 8.9–10.3)
Chloride: 100 mmol/L (ref 98–111)
Creatinine, Ser: 1.24 mg/dL (ref 0.61–1.24)
GFR calc Af Amer: >60 mL/min (ref >60)
GFR calc non Af Amer: 58 mL/min — ABNORMAL LOW (ref >60)
Glucose, Bld: 102 mg/dL — ABNORMAL HIGH (ref 70–99)
Potassium: 3.2 mmol/L — ABNORMAL LOW (ref 3.5–5.1)
Sodium: 137 mmol/L (ref 135–145)

## 2019-04-22 MED ORDER — POTASSIUM CHLORIDE CRYS ER 20 MEQ PO TBCR
40.0000 meq | EXTENDED_RELEASE_TABLET | Freq: Two times a day (BID) | ORAL | Status: DC
  Administered 2019-04-22 – 2019-04-24 (×5): 40 meq via ORAL
  Filled 2019-04-22 (×6): qty 2

## 2019-04-22 MED ORDER — POTASSIUM CHLORIDE CRYS ER 20 MEQ PO TBCR
40.0000 meq | EXTENDED_RELEASE_TABLET | Freq: Once | ORAL | Status: AC
  Administered 2019-04-22: 40 meq via ORAL
  Filled 2019-04-22: qty 2

## 2019-04-22 MED ORDER — MAGNESIUM SULFATE 2 GM/50ML IV SOLN
2.0000 g | Freq: Once | INTRAVENOUS | Status: AC
  Administered 2019-04-22: 2 g via INTRAVENOUS
  Filled 2019-04-22: qty 50

## 2019-04-22 NOTE — Progress Notes (Addendum)
Progress Note  Patient Name: Don [name] Date of Encounter: 04/22/2019  Primary Cardiologist: Nona Dell, MD   Subjective   No chest pain or palpitations overnight or this AM. Orthopnea has improved but his breathing is still not back to baseline. Continues to have significant lower extremity edema.   Inpatient Medications    Scheduled Meds: . allopurinol  300 mg Oral Daily  . carvedilol  6.25 mg Oral BID WC  . enoxaparin (LOVENOX) injection  40 mg Subcutaneous Q24H  . furosemide  60 mg Intravenous Q12H  . pantoprazole  40 mg Oral Daily  . potassium chloride  40 mEq Oral Daily  . pravastatin  40 mg Oral Daily  . sacubitril-valsartan  1 tablet Oral BID  . sodium chloride flush  3 mL Intravenous Once  . sodium chloride flush  3 mL Intravenous Q12H  . spironolactone  12.5 mg Oral Daily   Continuous Infusions: . sodium chloride     PRN Meds: sodium chloride, acetaminophen **OR** acetaminophen, ondansetron **OR** ondansetron (ZOFRAN) IV, oxyCODONE, sodium chloride flush   Vital Signs    Vitals:   04/21/19 1312 04/21/19 2133 04/22/19 0500 04/22/19 0522  BP: 113/81 120/77  120/73  Pulse: 71 72  69  Resp: 16     Temp: 98.2 F (36.8 C) 98.1 F (36.7 C)  98.3 F (36.8 C)  TempSrc: Oral Oral  Oral  SpO2: 100% 100%  100%  Weight:   80.3 kg   Height:        Intake/Output Summary (Last 24 hours) at 04/22/2019 0744 Last data filed at 04/22/2019 0525 Gross per 24 hour  Intake 843 ml  Output 3350 ml  Net -2507 ml    Last 3 Weights 04/22/2019 04/21/2019 04/20/2019  Weight (lbs) 177 lb 0.5 oz 180 lb 12.4 oz 188 lb 4.4 oz  Weight (kg) 80.3 kg 82 kg 85.4 kg      Telemetry    NSR, HR in 60's to 70's with occasional PVC's. Brief episode of WCT overnight, most consistent with 6 beats NSVT - Personally Reviewed  ECG    NSR, HR 70, with LAFB - Personally Reviewed  Physical Exam   General: Well developed, well nourished, male appearing in no acute distress. Head:  Normocephalic, atraumatic.  Neck: Supple without bruits, JVD at 9 cm. Lungs:  Resp regular and unlabored, decreased along bases bilaterally. Heart: RRR, S1, S2, no S3, S4, or murmur; no rub. Abdomen: Soft, non-tender, non-distended with normoactive bowel sounds. No hepatomegaly. No rebound/guarding. No obvious abdominal masses. Extremities: No clubbing or cyanosis, 2+ pitting edema up to knees bilaterally. Distal pedal pulses are 2+ bilaterally. Neuro: Alert and oriented X 3. Moves all extremities spontaneously. Psych: Normal affect.  Labs    Chemistry Recent Labs  Lab 04/18/19 2328  04/19/19 0357 04/20/19 0744 04/21/19 0609 04/22/19 0319  NA  --    < > 142 141 141 137  K  --    < > 3.6 3.4* 3.4* 3.2*  CL  --    < > 108 103 102 100  CO2  --    < > 27 28 31 29   GLUCOSE  --    < > 113* 89 90 102*  BUN  --    < > 25* 22 21 19   CREATININE  --    < > 1.27* 1.40* 1.31* 1.24  CALCIUM  --    < > 8.7* 8.8* 8.8* 8.3*  PROT 6.8  --  6.4*  --   --   --  ALBUMIN 3.3*  --  3.1*  --   --   --   AST 36  --  31  --   --   --   ALT 22  --  21  --   --   --   ALKPHOS 114  --  104  --   --   --   BILITOT 2.2*  --  2.1*  --   --   --   GFRNONAA  --    < > 56* 50* 54* 58*  GFRAA  --    < > >60 58* >60 >60  ANIONGAP  --    < > 7 10 8 8    < > = values in this interval not displayed.     Hematology Recent Labs  Lab 04/19/19 0357 04/20/19 0744 04/21/19 0609  WBC 4.1 4.8 4.3  RBC 3.81* 3.66* 3.56*  HGB 12.4* 12.0* 11.5*  HCT 39.0 37.2* 36.1*  MCV 102.4* 101.6* 101.4*  MCH 32.5 32.8 32.3  MCHC 31.8 32.3 31.9  RDW 15.7* 15.5 15.4  PLT 146* 167 149*    Cardiac EnzymesNo results for input(s): TROPONINI in the last 168 hours. No results for input(s): TROPIPOC in the last 168 hours.   BNP Recent Labs  Lab 04/18/19 2329  BNP 1,170.0*     DDimer No results for input(s): DDIMER in the last 168 hours.   Radiology    No results found.  Cardiac Studies   Echocardiogram: 04/19/2019  IMPRESSIONS   1. The left ventricle has severely reduced systolic function, with an ejection fraction of 20-25%. The cavity size was normal. There is mildly increased left ventricular wall thickness. Left ventricular diastolic Doppler parameters are indeterminate.  Left ventricular diffuse hypokinesis.  2. The right ventricle has moderately reduced systolic function. The cavity was moderately enlarged. There is no increase in right ventricular wall thickness. Right ventricular systolic pressure is moderately elevated with an estimated pressure of 57.8  mmHg.  3. Left atrial size was mildly dilated.  4. Right atrial size was severely dilated.  5. Large pleural effusion in the left lateral region.  6. Small pericardial effusion with moderate collection posteriorly.  7. The aortic valve is tricuspid. Mild calcification of the aortic valve. Aortic valve regurgitation is trivial by color flow Doppler. Moderate aortic annular calcification noted.  8. The mitral valve is grossly normal. There is mild mitral regurgitation.  9. The tricuspid valve is grossly normal. Tricuspid valve regurgitation is moderate. 10. The aorta is normal in size and structure. 11. The inferior vena cava was dilated in size with <50% respiratory variability.  Patient Profile     73 y.o. male w/ PMH pf Hepatitis C, liver cirrhosis, HTN, HLD, Stage 3 CKD, and prior alcohol use who is currently admitted with a CHF exacerbation. Found to have a newly diagnosed cardiomyopathy with EF of 20-25%.   Assessment & Plan    1. Acute on Chronic Combined Systolic and Diastolic CHF - he presented with worsening dyspnea, abdominal distention and lower extremity edema. BNP elevated to 1170 and CXR consistent with CHF. Echo shows an EF of 20-25% as outlined above.  - he has been receiving IV Lasix 60mg  BID with a net output of -9.5L thus far (-2.7 L within 24 hours) with weight having declined by at least 11 lbs (variable at 196 lbs or 188  lbs on admission and now at 177 lbs). Continue with IV Lasix at current dosing as he is responding well.  Will likely require several more days of IV diuresis given his volume status. He is unsure of his baseline weight as he does not have scales at home.  - given his cardiomyopathy, he will require ischemic evaluation at some point. By review of the initial consult note, the plan was for medical therapy for now and reassessment of his EF following this. If he has recurrent pain, would recommend ischemic testing this admission. He has been started on Entresto (following 36-hour washout of ACE-I) along with Coreg and Spironolactone.    2. Elevated Cardiac Enzymes - he reports episodes of chest discomfort prior to admission when he was having difficulty breathing but denies any recurrent symptoms since admission. No recent exertional symptoms. Enzymes have been flat at 82, 80, 68, and 74. Most consistent with demand ischemia in the setting of his CHF and not consistent with ACS.  - unclear why not on ASA. Would consider initiation of this until ischemic testing is performed. Continue BB and statin therapy.   3. NSVT - up to 6 beats NSVT noted on telemetry. AM labs show Mg at 1.8 and K+ 3.2. Supplementation already ordered by the admitting team.  - continue Coreg 6.25mg  BID.   4. HTN - he was on Labetalol and Lisinopril PTA with these having been stopped and switched to Coreg, Spironolactone, and Entresto in the setting of his cardiomyopathy. Follow BP closely as this has been at 113/73 - 120/81 within the past 24 hours.    5. HLD - no FLP on file. Will check with AM labs. Remains on PTA Pravastatin 40mg  daily.   6. Stage 3 CKD - unknown baseline. At 1.34 on admission and improved to 1.24 today. Continue to follow closely given initiation of Spironolactone and Entresto.    For questions or updates, please contact [facility] Please consult [email or web address] for contact info under Cardiology/STEMI.    Lorri FrederickSigned, Brittany M Strader , PA-C 7:44 AM 04/22/2019 Pager: [phone]  Attending note  Patient seen and discussed with PA Iran OuchStrader, I agree with her documentation above. 73 yo male with acute systolic heart failure, new diagnosis this admission. Echo this admit LVEF 20-25%, moderate RV dysfunction, PASP 58. Neg 2.7L yesterday, negative 8.6L since admission, he is on lasix 60mg  IV bid. REnal function is stable, continue IV diuresis. From initial consult note no plans for ischemic testing at this time, can be considered as outpatient with f/u with Dr Diona BrownerMcDowell.   He is on coreg 6.25mg  bid, entresto 24/26mg  bid, aldactone 12.5mg  daily. Titrate further as tolerated as outpatient.  Keep K at 4 and Mg at 2 in setting of low LVEF and NSVT. Fairly mild ventricular ectopy burden by tele, may titrate coreg further if becomes more prominent.  Remains volume overloaded, continue IV diuresis today.   Dina RichJonathan  MD

## 2019-04-22 NOTE — Progress Notes (Signed)
Wide QRS complexes noted. Dr. Darrick Meigs notified orders received.

## 2019-04-22 NOTE — Progress Notes (Addendum)
PROGRESS NOTE  Don [name]  TIW:[id number]  DOB: January 03, 1946  DOA: 04/18/2019 PCP: Lucia Gaskins, MD   Brief Admission Hx: 73 year old male with hypertension, hyperlipidemia, hepatitis C presented with increasing lower extremity edema and shortness of breath progressive over 2 weeks.  MDM/Assessment & Plan:   1. Acute on chronic systolic congestive heart failure-this is a new diagnosis as the patient does not have a known history of congestive heart failure however he has severely reduced left ventricular systolic function with an EF of 20 to 25%.  He has been started on IV Lasix and has been diuresing.  He has diuresed 8L since admission.  He is starting to feel better.   He denies chest pain.  He has been started on entresto, aldactone and coreg by cardiology and tolerating it.  Continue to diurese with IV lasix today per cardiology recommendations.  Careful with diuresis given history of gout.   2. Essential hypertension - blood pressures are much better controlled.  Continue to follow.     3. Left pleural effusion - continue diuresis with IV lasix. Repeat CXR in AM.  4. H/o ETOH abuse / Hepatitis C / liver cirrhosis - Outpatient GI follow up.  5. Chest pain - resolved now.  Secondary to CHF exacerbation, troponin with findings of demand ischemia from CHF exacerbation.  6. CKD stage 3 - likely cardiorenal in setting of severely depressed EF.  Following.  7. Anemia in CKD - Hg stable. Follow.  8. Prolonged QTc - repleting electrolytes. Monitor telemetry.   9. Hypokalemia / Hypomagnesemia - IV mag replacement, oral potassium replacement.   I ordered additional 2 gm Magnesium IV today.  10. Thrombocytopenia - Platelets down to 149 after starting enoxaparin.  Recheck CBC in AM.    DVT prophylaxis: lovenox  Code Status: Full  Family Communication: telephone update to sister Disposition Plan: inpatient for IV diuresis  Consultants:  Cardiology 7/27  Procedures:  Echocardiogram   IMPRESSIONS  1. The left ventricle has severely reduced systolic function, with an ejection fraction of 20-25%. The cavity size was normal. There is mildly increased left ventricular wall thickness. Left ventricular diastolic Doppler parameters are indeterminate.  Left ventricular diffuse hypokinesis.  2. The right ventricle has moderately reduced systolic function. The cavity was moderately enlarged. There is no increase in right ventricular wall thickness. Right ventricular systolic pressure is moderately elevated with an estimated pressure of 57.8  mmHg.  3. Left atrial size was mildly dilated.  4. Right atrial size was severely dilated.  5. Large pleural effusion in the left lateral region.  6. Small pericardial effusion with moderate collection posteriorly.  7. The aortic valve is tricuspid. Mild calcification of the aortic valve. Aortic valve regurgitation is trivial by color flow Doppler. Moderate aortic annular calcification noted.  8. The mitral valve is grossly normal. There is mild mitral regurgitation.  9. The tricuspid valve is grossly normal. Tricuspid valve regurgitation is moderate. 10. The aorta is normal in size and structure. 11. The inferior vena cava was dilated in size with <50% respiratory variability.  Antimicrobials:     Subjective: Pt says he is starting to feel better. No more chest pain.    Objective: Vitals:   04/21/19 1312 04/21/19 2133 04/22/19 0500 04/22/19 0522  BP: 113/81 120/77  120/73  Pulse: 71 72  69  Resp: 16     Temp: 98.2 F (36.8 C) 98.1 F (36.7 C)  98.3 F (36.8 C)  TempSrc: Oral Oral  Oral  SpO2: 100% 100%  100%  Weight:   80.3 kg   Height:        Intake/Output Summary (Last 24 hours) at 04/22/2019 1106 Last data filed at 04/22/2019 3403 Gross per 24 hour  Intake 1743 ml  Output 3350 ml  Net -1607 ml   Filed Weights   04/20/19 0300 04/21/19 0635 04/22/19 0500  Weight: 85.4 kg 82 kg 80.3 kg   REVIEW OF SYSTEMS  As per  history otherwise all reviewed and reported negative  Exam:  General exam: chronically ill appearing male. NAD.  Respiratory system: Clear. No increased work of breathing. Cardiovascular system: S1 & S2 heard. Mild JVD, no murmurs, gallops, clicks.  Bilateral pedal edema. Gastrointestinal system: Abdomen is nondistended, soft and nontender. Normal bowel sounds heard. Central nervous system: Alert and oriented. No focal neurological deficits. Extremities: 1-2+ pitting pretibial edema BLEs.  Data Reviewed: Basic Metabolic Panel: Recent Labs  Lab 04/18/19 2333 04/19/19 0117 04/19/19 0357 04/20/19 0744 04/21/19 0609 04/22/19 0319  NA 141  --  142 141 141 137  K 3.6  --  3.6 3.4* 3.4* 3.2*  CL 107  --  108 103 102 100  CO2 26  --  27 28 31 29   GLUCOSE 133*  --  113* 89 90 102*  BUN 25*  --  25* 22 21 19   CREATININE 1.34*  --  1.27* 1.40* 1.31* 1.24  CALCIUM 8.9  --  8.7* 8.8* 8.8* 8.3*  MG  --  1.7  --  1.6* 1.8 1.8   Liver Function Tests: Recent Labs  Lab 04/18/19 2328 04/19/19 0357  AST 36 31  ALT 22 21  ALKPHOS 114 104  BILITOT 2.2* 2.1*  PROT 6.8 6.4*  ALBUMIN 3.3* 3.1*   No results for input(s): LIPASE, AMYLASE in the last 168 hours. No results for input(s): AMMONIA in the last 168 hours. CBC: Recent Labs  Lab 04/18/19 2333 04/19/19 0357 04/20/19 0744 04/21/19 0609  WBC 3.9* 4.1 4.8 4.3  HGB 12.3* 12.4* 12.0* 11.5*  HCT 38.4* 39.0 37.2* 36.1*  MCV 102.1* 102.4* 101.6* 101.4*  PLT 140* 146* 167 149*   Cardiac Enzymes: No results for input(s): CKTOTAL, CKMB, CKMBINDEX, TROPONINI in the last 168 hours. CBG (last 3)  No results for input(s): GLUCAP in the last 72 hours. Recent Results (from the past 240 hour(s))  SARS Coronavirus 2 (CEPHEID - Performed in [facility] hospital lab), Hosp Order     Status: None   Collection Time: 04/18/19 11:46 PM   Specimen: Nasopharyngeal Swab  Result Value Ref Range Status   SARS Coronavirus 2 NEGATIVE NEGATIVE Final     Comment: (NOTE) If result is NEGATIVE SARS-CoV-2 target nucleic acids are NOT DETECTED. The SARS-CoV-2 RNA is generally detectable in upper and lower  respiratory specimens during the acute phase of infection. The lowest  concentration of SARS-CoV-2 viral copies this assay can detect is 250  copies / mL. A negative result does not preclude SARS-CoV-2 infection  and should not be used as the sole basis for treatment or other  patient management decisions.  A negative result may occur with  improper specimen collection / handling, submission of specimen other  than nasopharyngeal swab, presence of viral mutation(s) within the  areas targeted by this assay, and inadequate number of viral copies  (<250 copies / mL). A negative result must be combined with clinical  observations, patient history, and epidemiological information. If result is POSITIVE SARS-CoV-2 target nucleic acids are DETECTED.  The SARS-CoV-2 RNA is generally detectable in upper and lower  respiratory specimens dur ing the acute phase of infection.  Positive  results are indicative of active infection with SARS-CoV-2.  Clinical  correlation with patient history and other diagnostic information is  necessary to determine patient infection status.  Positive results do  not rule out bacterial infection or co-infection with other viruses. If result is PRESUMPTIVE POSTIVE SARS-CoV-2 nucleic acids MAY BE PRESENT.   A presumptive positive result was obtained on the submitted specimen  and confirmed on repeat testing.  While 2019 novel coronavirus  (SARS-CoV-2) nucleic acids may be present in the submitted sample  additional confirmatory testing may be necessary for epidemiological  and / or clinical management purposes  to differentiate between  SARS-CoV-2 and other Sarbecovirus currently known to infect humans.  If clinically indicated additional testing with an alternate test  methodology [id number]) is advised. The SARS-CoV-2  RNA is generally  detectable in upper and lower respiratory sp ecimens during the acute  phase of infection. The expected result is Negative. Fact Sheet for Patients:  BoilerBrush.com.cyhttps://www.fda.gov/media/136312/download Fact Sheet for Healthcare Providers: https://pope.com/https://www.fda.gov/media/136313/download This test is not yet approved or cleared by the [place] FDA and has been authorized for detection and/or diagnosis of SARS-CoV-2 by FDA under an Emergency Use Authorization (EUA).  This EUA will remain in effect (meaning this test can be used) for the duration of the COVID-19 declaration under Section 564(b)(1) of the Act, 21 U.S.C. section 360bbb-3(b)(1), unless the authorization is terminated or revoked sooner. Performed at [facility], [address] [place], [place] [address]      Studies: No results found. Scheduled Meds: . allopurinol  300 mg Oral Daily  . carvedilol  6.25 mg Oral BID WC  . enoxaparin (LOVENOX) injection  40 mg Subcutaneous Q24H  . furosemide  60 mg Intravenous Q12H  . pantoprazole  40 mg Oral Daily  . potassium chloride  40 mEq Oral BID  . pravastatin  40 mg Oral Daily  . sacubitril-valsartan  1 tablet Oral BID  . sodium chloride flush  3 mL Intravenous Once  . sodium chloride flush  3 mL Intravenous Q12H  . spironolactone  12.5 mg Oral Daily   Continuous Infusions: . sodium chloride      Active Problems:   High cholesterol   GERD (gastroesophageal reflux disease)   Gout   Hepatitis C   CHF (congestive heart failure) (HCC)   Pleural effusion   Hypertension  Time spent:   Standley Dakinslanford , MD [facility] 04/22/2019, 11:06 AM    LOS: 3 days  How to contact the [facility] Attending or Consulting provider 7A - 7P or covering provider during after hours 7P -7A, for this patient?  1. Check the care team in [facility] and look for a) attending/consulting TR[facility] provider listed and b) the [facility] team listed 2. Log into [email or web address] and use [place] universal  password to access. If you do not have the password, please contact the hospital operator. 3. Locate the [facility] provider you are looking for under [facility] and page to a number that you can be directly reached. 4. If you still have difficulty reaching the provider, please page the [facility] (Director on Call) for the Hospitalists listed on amion for assistance.

## 2019-04-23 ENCOUNTER — Inpatient Hospital Stay (HOSPITAL_COMMUNITY): Payer: Medicare Other

## 2019-04-23 DIAGNOSIS — M1000 Idiopathic gout, unspecified site: Secondary | ICD-10-CM

## 2019-04-23 LAB — BASIC METABOLIC PANEL
Anion gap: 8 (ref 5–15)
BUN: 15 mg/dL (ref 8–23)
CO2: 33 mmol/L — ABNORMAL HIGH (ref 22–32)
Calcium: 8.7 mg/dL — ABNORMAL LOW (ref 8.9–10.3)
Chloride: 97 mmol/L — ABNORMAL LOW (ref 98–111)
Creatinine, Ser: 1.22 mg/dL (ref 0.61–1.24)
GFR calc Af Amer: >60 mL/min (ref >60)
GFR calc non Af Amer: 59 mL/min — ABNORMAL LOW (ref >60)
Glucose, Bld: 103 mg/dL — ABNORMAL HIGH (ref 70–99)
Potassium: 3.9 mmol/L (ref 3.5–5.1)
Sodium: 138 mmol/L (ref 135–145)

## 2019-04-23 LAB — CBC
HCT: 37.7 % — ABNORMAL LOW (ref 39.0–52.0)
Hemoglobin: 12.1 g/dL — ABNORMAL LOW (ref 13.0–17.0)
MCH: 32.7 pg (ref 26.0–34.0)
MCHC: 32.1 g/dL (ref 30.0–36.0)
MCV: 101.9 fL — ABNORMAL HIGH (ref 80.0–100.0)
Platelets: 174 10*3/uL (ref 150–400)
RBC: 3.7 MIL/uL — ABNORMAL LOW (ref 4.22–5.81)
RDW: 14.5 % (ref 11.5–15.5)
WBC: 5.5 10*3/uL (ref 4.0–10.5)
nRBC: 0 % (ref 0.0–0.2)

## 2019-04-23 LAB — MAGNESIUM: Magnesium: 1.8 mg/dL (ref 1.7–2.4)

## 2019-04-23 LAB — LIPID PANEL
Cholesterol: 113 mg/dL (ref 0–200)
HDL: 37 mg/dL — ABNORMAL LOW (ref >40)
LDL Cholesterol: 60 mg/dL (ref 0–99)
Total CHOL/HDL Ratio: 3.1 RATIO
Triglycerides: 81 mg/dL (ref <150)
VLDL: 16 mg/dL (ref 0–40)

## 2019-04-23 LAB — TSH: TSH: 6.777 u[IU]/mL — ABNORMAL HIGH (ref 0.350–4.500)

## 2019-04-23 LAB — T4, FREE: Free T4: 0.87 ng/dL (ref 0.61–1.12)

## 2019-04-23 MED ORDER — MAGNESIUM SULFATE 2 GM/50ML IV SOLN
2.0000 g | Freq: Once | INTRAVENOUS | Status: AC
  Administered 2019-04-23: 2 g via INTRAVENOUS
  Filled 2019-04-23: qty 50

## 2019-04-23 MED ORDER — POLYETHYLENE GLYCOL 3350 17 G PO PACK: 17.0000 g | PACK | Freq: Every day | ORAL | Status: DC | PRN

## 2019-04-23 MED ORDER — SENNOSIDES-DOCUSATE SODIUM 8.6-50 MG PO TABS: 2.0000 | ORAL_TABLET | Freq: Every evening | ORAL | Status: DC | PRN

## 2019-04-23 NOTE — TOC Initial Note (Signed)
Transition of Care [facility]) - Initial/Assessment Note    Patient Details  Name: Don [name] MRN: [id number] Date of Birth: 1946/05/30  [facility]) CM/SW Contact:    Leitha Bleak, RN Phone Number: 04/23/2019, 3:52 PM  Clinical Narrative:   Patient admitted for CHF, PT recommending SNF.  Patient is refusing, wanting to go home. Gave permission to call his sister- Don [name].  She states patient live at home with her and her daughter is there to assist. Patient and family agreeing to Home Health. They had not prefrence when reviewed list.  Referral given and accepted by Alroy Bailiff with [facility] for HHPT.  Updated MD and ask for orders. CM consult for medication help, CM will give patient Entreso card.                 Expected Discharge Plan: Home w Home Health Services Barriers to Discharge: Continued Medical Work up   Patient Goals and CMS Choice Patient states their goals for this hospitalization and ongoing recovery are:: to go home. CMS Medicare.gov Compare Post Acute Care list provided to:: Patient Choice offered to / list presented to : Patient(Sister)  Expected Discharge Plan and Services Expected Discharge Plan: Home w Home Health Services   Discharge Planning Services: CM Consult Post Acute Care Choice: Home Health Living arrangements for the past 2 months: Single Family Home                           HH Arranged: PT HH Agency: [facility] Date HH Agency Contacted: 04/23/19 Time HH Agency Contacted: 1551 Representative spoke with at [facility] Agency: Alroy Bailiff  Prior Living Arrangements/Services Living arrangements for the past 2 months: Single Family Home Lives with:: Siblings Patient language and need for interpreter reviewed:: Yes Do you feel safe going back to the place where you live?: Yes      Need for Family Participation in Patient Care: Yes (Comment) Care giver support system in place?: Yes (comment)    Criminal Activity/Legal Involvement Pertinent to Current Situation/Hospitalization: No - Comment as needed  Activities of Daily Living Home Assistive Devices/Equipment: None ADL Screening (condition at time of admission) Patient's cognitive ability adequate to safely complete daily activities?: Yes Is the patient deaf or have difficulty hearing?: No Does the patient have difficulty seeing, even when wearing glasses/contacts?: No Does the patient have difficulty concentrating, remembering, or making decisions?: Yes Patient able to express need for assistance with ADLs?: Yes Does the patient have difficulty dressing or bathing?: No Independently performs ADLs?: Yes (appropriate for developmental age) Does the patient have difficulty walking or climbing stairs?: No Weakness of Legs: Both Weakness of Arms/Hands: None  Permission Sought/Granted Permission sought to share information with : Case Manager Permission granted to share information with : Yes, Verbal Permission Granted  Share Information with NAME: Alroy Bailiff  Permission granted to share info w AGENCY: [facility]  Permission granted to share info w Relationship: Sister- Don [name]     Emotional Assessment     Affect (typically observed): Accepting Orientation: : Oriented to Self, Oriented to Place, Oriented to  Time, Oriented to Situation   Psych Involvement: No (comment)  Admission diagnosis:  Pleural effusion [J90] Patient Active Problem List   Diagnosis Date Noted  . Hypertension   . CHF (congestive heart failure) (HCC) 04/19/2019  . Pleural effusion   . High cholesterol 03/19/2015  . GERD (gastroesophageal reflux disease) 03/19/2015  .  Gout 03/19/2015  . Hepatitis C 03/19/2015   PCP:  Lucia Gaskins, MD Pharmacy:   [facility], [address] [address] Phone: [phone] Fax: [phone]         Readmission Risk Interventions No  flowsheet data found.

## 2019-04-23 NOTE — Evaluation (Signed)
Physical Therapy Evaluation Patient Details Name: Don [name] MRN: [id number] DOB: 1946/09/14 Today's Date: 04/23/2019   History of Present Illness  Don [name]  is a 73 y.o. male, with history of hypertension, hepatitis C, hyperlipidemia who came to ED for chief complaint of worsening swelling of the lower extremities and shortness of breath for past 2 weeks.  Patient states that he saw PCP today who prescribed fluid pills and he took 1 pill with no significant improvement.  Patient denies fever or chills.  Also complains of chest pain.  No nausea vomiting or diarrhea.  Patient is a poor historian so history is somewhat limited.Denies abdominal pain or dysuria.Patient complains of dyspnea on exertion    Clinical Impression  Patient unsteady without using an AD with near loss of balance, staggering left/right, had to use a RW for safety and able to ambulate with slow labored cadence with occasional scissoring of legs.  Patient c/o mild dizziness while up and tolerated sitting in chair after therapy - RN aware.  Patient on room air during ambulation with SpO2 between 89-92%, left on room air while in chair with SpO2 at 94%.  Patient will benefit from continued physical therapy in hospital and recommended venue below to increase strength, balance, endurance for safe ADLs and gait.    Follow Up Recommendations SNF;Supervision for mobility/OOB;Supervision - Intermittent    Equipment Recommendations  Rolling walker with 5" wheels    Recommendations for Other Services       Precautions / Restrictions Precautions Precautions: Fall Restrictions Weight Bearing Restrictions: No      Mobility  Bed Mobility Overal bed mobility: Modified Independent             General bed mobility comments: increased time, labored movement  Transfers Overall transfer level: Needs assistance Equipment used: Rolling walker (2 wheeled) Transfers: Sit to/from [facility] Sit to Stand:  Min guard;Min assist Stand pivot transfers: Min guard;Min assist       General transfer comment: unsteady on feet with near loss of balance, required RW for safety  Ambulation/Gait Ambulation/Gait assistance: Min assist Gait Distance (Feet): 65 Feet Assistive device: Rolling walker (2 wheeled) Gait Pattern/deviations: Decreased step length - right;Decreased step length - left;Decreased stride length;Staggering left;Staggering right;Scissoring Gait velocity: decreased   General Gait Details: very unsteady with staggering left/right without use of an AD, required use of RW demonstrating slow unsteady cadence with occasional scissoring of legs  Stairs            Wheelchair Mobility    Modified Rankin (Stroke Patients Only)       Balance Overall balance assessment: Needs assistance Sitting-balance support: Feet supported;No upper extremity supported Sitting balance-Leahy Scale: Good     Standing balance support: During functional activity;No upper extremity supported Standing balance-Leahy Scale: Poor Standing balance comment: fair using RW                             Pertinent Vitals/Pain Pain Assessment: No/denies pain    Home Living Family/patient expects to be discharged to:: Private residence Living Arrangements: Other relatives Available Help at Discharge: Family;Available 24 hours/day Type of Home: House Home Access: Stairs to enter Entrance Stairs-Rails: None Entrance Stairs-Number of Steps: 4 Home Layout: Two level;Bed/bath upstairs;Other (Comment)(Patient stays upstairs) Home Equipment: Cane - single point      Prior Function Level of Independence: Independent         Comments: Hydrographic surveyor, drives  Hand Dominance        Extremity/Trunk Assessment   Upper Extremity Assessment Upper Extremity Assessment: Generalized weakness    Lower Extremity Assessment Lower Extremity Assessment: Generalized weakness     Cervical / Trunk Assessment Cervical / Trunk Assessment: Normal  Communication   Communication: No difficulties  Cognition Arousal/Alertness: Awake/alert Behavior During Therapy: WFL for tasks assessed/performed Overall Cognitive Status: Within Functional Limits for tasks assessed                                        General Comments      Exercises     Assessment/Plan    PT Assessment Patient needs continued PT services  PT Problem List Decreased strength;Decreased activity tolerance;Decreased balance;Decreased mobility       PT Treatment Interventions Gait training;Stair training;Balance training;Functional mobility training;Therapeutic activities;Therapeutic exercise;Patient/family education    PT Goals (Current goals can be found in the Care Plan section)  Acute Rehab PT Goals Patient Stated Goal: return home with family to assist PT Goal Formulation: With patient Time For Goal Achievement: 05/07/19 Potential to Achieve Goals: Good    Frequency Min 3X/week   Barriers to discharge        Co-evaluation               AM-PAC PT "6 Clicks" Mobility  Outcome Measure Help needed turning from your back to your side while in a flat bed without using bedrails?: None Help needed moving from lying on your back to sitting on the side of a flat bed without using bedrails?: None Help needed moving to and from a bed to a chair (including a wheelchair)?: A Little Help needed standing up from a chair using your arms (e.g., wheelchair or bedside chair)?: A Little Help needed to walk in hospital room?: A Little Help needed climbing 3-5 steps with a railing? : A Lot 6 Click Score: 19    End of Session   Activity Tolerance: Patient tolerated treatment well;Patient limited by fatigue Patient left: in chair;with call bell/phone within reach;with chair alarm set Nurse Communication: Mobility status PT Visit Diagnosis: Unsteadiness on feet (R26.81);Other  abnormalities of gait and mobility (R26.89);Muscle weakness (generalized) (M62.81)    Time: 2458-0998 PT Time Calculation (min) (ACUTE ONLY): 27 min   Charges:   PT Evaluation $PT Eval Moderate Complexity: 1 Mod PT Treatments $Therapeutic Activity: 23-37 mins        4:06 PM, 04/23/19 Ocie Bob, MPT Physical Therapist with [facility] [phone] office [phone] mobile phone

## 2019-04-23 NOTE — Progress Notes (Signed)
PROGRESS NOTE    Don [name]  GBT:[id number] DOB: Feb 18, 1946 DOA: 04/18/2019 PCP: Lucia Gaskins, MD   Brief Narrative:  73 year old with history of essential hypertension, hepatitis C, hyperlipidemia, alcohol use with hepatitis C, liver cirrhosis, CKD stage III, anemia of chronic disease, thrombocytopenia came to the hospital with progressive shortness of breath and lower extremity edema over the course the past couple of days.  Patient was started on IV diuretics.   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  Plan on continuing IV Lasix for at least another 24 hours. -Cardiology team is following. -Fluid restriction, daily weight. -Closely monitor ins and outs. -Plans for possible outpatient ischemic work-up  Left-sided pleural effusion -This is secondary to his congestive heart failure.  Continue IV Lasix at this time.  Essential hypertension - On Coreg, Entresto, Aldactone.  Also getting diuretics.  History of alcohol use/hepatitis C with cirrhosis, compensated -Supportive care.  No evidence of hepatic encephalopathy.  CKD stage III -From baseline of 1.2.  Seems to have slightly improved with diuresis.  Anemia of chronic disease -Baseline around 11   Thrombocytopenia -Suspect secondary to underlying cirrhosis.  Without any evidence of obvious bleeding.  Some ambulatory dysfunction -We will get PT/OT to see him  DVT prophylaxis: Lovenox Code Status: Full code Family Communication: None at bedside Disposition Plan: Continue hospital stay for IV diuretics.  On Lasix 60 mg IV.  Consultants:   Cardiology   Procedures:   None  Antimicrobials:   None   Subjective: Feels a little better but still has exertional shortness of breath.  Having difficulty time getting out of bed.  Review of Systems Otherwise negative except as per HPI, including: General: Denies fever, chills, night sweats or unintended weight loss. Resp: Denies cough, wheezing, Cardiac: Denies chest pain, palpitations, orthopnea, paroxysmal nocturnal dyspnea. GI: Denies abdominal pain, nausea, vomiting, diarrhea or constipation GU: Denies dysuria, frequency, hesitancy or incontinence MS: Denies muscle aches, joint pain or swelling Neuro: Denies headache, neurologic deficits (focal weakness, numbness, tingling), abnormal gait Psych: Denies anxiety, depression, SI/HI/AVH Skin: Denies new rashes or lesions ID: Denies sick contacts, exotic exposures, travel  Objective: Vitals:   04/22/19 1941 04/22/19 2111 04/23/19 0500 04/23/19 0850  BP:  122/73  101/68  Pulse:  73  70  Resp:  17    Temp:  98.1 F (36.7 C)    TempSrc:  Oral    SpO2: 100% 100%    Weight:   76.9 kg   Height:        Intake/Output Summary (Last 24 hours) at 04/23/2019 1348 Last data filed at 04/23/2019 1000 Gross per 24 hour  Intake 570 ml  Output 3700 ml  Net -3130 ml   Filed Weights   04/21/19 0635 04/22/19 0500 04/23/19 0500  Weight: 82 kg 80.3 kg 76.9 kg    Examination:  General exam: Appears calm and comfortable  Respiratory system: Bibasilar crackles more on left than right Cardiovascular system: S1 & S2 heard, RRR. No JVD, murmurs, rubs, gallops or clicks.  1+ bilateral lower extremity pitting edema Gastrointestinal system: Abdomen is nondistended, soft and nontender. No organomegaly or masses felt. Normal bowel  sounds heard. Central nervous system: Alert and oriented. No focal neurological deficits. Extremities: Symmetric 5 x 5 power. Skin: No rashes, lesions or ulcers Psychiatry: Judgement and insight appear normal. Mood & affect  appropriate.     Data Reviewed:   CBC: Recent Labs  Lab 04/18/19 2333 04/19/19 0357 04/20/19 0744 04/21/19 0609 04/23/19 0411  WBC 3.9* 4.1 4.8 4.3 5.5  HGB 12.3* 12.4* 12.0* 11.5* 12.1*  HCT 38.4* 39.0 37.2* 36.1* 37.7*  MCV 102.1* 102.4* 101.6* 101.4* 101.9*  PLT 140* 146* 167 149* 174   Basic Metabolic Panel: Recent Labs  Lab 04/19/19 0117 04/19/19 0357 04/20/19 0744 04/21/19 0609 04/22/19 0319 04/23/19 0411  NA  --  142 141 141 137 138  K  --  3.6 3.4* 3.4* 3.2* 3.9  CL  --  108 103 102 100 97*  CO2  --  27 28 31 29  33*  GLUCOSE  --  113* 89 90 102* 103*  BUN  --  25* 22 21 19 15   CREATININE  --  1.27* 1.40* 1.31* 1.24 1.22  CALCIUM  --  8.7* 8.8* 8.8* 8.3* 8.7*  MG 1.7  --  1.6* 1.8 1.8 1.8   GFR: Estimated Creatinine Clearance: 53 mL/min (by C-G formula based on SCr of 1.22 mg/dL). Liver Function Tests: Recent Labs  Lab 04/18/19 2328 04/19/19 0357  AST 36 31  ALT 22 21  ALKPHOS 114 104  BILITOT 2.2* 2.1*  PROT 6.8 6.4*  ALBUMIN 3.3* 3.1*   No results for input(s): LIPASE, AMYLASE in the last 168 hours. No results for input(s): AMMONIA in the last 168 hours. Coagulation Profile: Recent Labs  Lab 04/18/19 2328  INR 1.4*   Cardiac Enzymes: No results for input(s): CKTOTAL, CKMB, CKMBINDEX, TROPONINI in the last 168 hours. BNP (last 3 results) No results for input(s): PROBNP in the last 8760 hours. HbA1C: No results for input(s): HGBA1C in the last 72 hours. CBG: No results for input(s): GLUCAP in the last 168 hours. Lipid Profile: Recent Labs    04/23/19 0411  CHOL 113  HDL 37*  LDLCALC 60  TRIG 81  CHOLHDL 3.1   Thyroid Function Tests: Recent Labs    04/23/19 0411  TSH 6.777*  FREET4 0.87   Anemia Panel: No results for input(s): VITAMINB12, FOLATE, FERRITIN, TIBC, IRON, RETICCTPCT in the last 72 hours. Sepsis Labs: No results for input(s): PROCALCITON, LATICACIDVEN in the last 168 hours.  Recent Results (from the past 240  hour(s))  SARS Coronavirus 2 (CEPHEID - Performed in [facility] hospital lab), Hosp Order     Status: None   Collection Time: 04/18/19 11:46 PM   Specimen: Nasopharyngeal Swab  Result Value Ref Range Status   SARS Coronavirus 2 NEGATIVE NEGATIVE Final    Comment: (NOTE) If result is NEGATIVE SARS-CoV-2 target nucleic acids are NOT DETECTED. The SARS-CoV-2 RNA is generally detectable in upper and lower  respiratory specimens during the acute phase of infection. The lowest  concentration of SARS-CoV-2 viral copies this assay can detect is 250  copies / mL. A negative result does not preclude SARS-CoV-2 infection  and should not be used as the sole basis for treatment or other  patient management decisions.  A negative result may occur with  improper specimen collection / handling, submission of specimen other  than nasopharyngeal swab, presence of viral mutation(s) within the  areas targeted by this assay, and inadequate number of viral copies  (<250 copies / mL). A negative result must be  combined with clinical  observations, patient history, and epidemiological information. If result is POSITIVE SARS-CoV-2 target nucleic acids are DETECTED. The SARS-CoV-2 RNA is generally detectable in upper and lower  respiratory specimens dur ing the acute phase of infection.  Positive  results are indicative of active infection with SARS-CoV-2.  Clinical  correlation with patient history and other diagnostic information is  necessary to determine patient infection status.  Positive results do  not rule out bacterial infection or co-infection with other viruses. If result is PRESUMPTIVE POSTIVE SARS-CoV-2 nucleic acids MAY BE PRESENT.   A presumptive positive result was obtained on the submitted specimen  and confirmed on repeat testing.  While 2019 novel coronavirus  (SARS-CoV-2) nucleic acids may be present in the submitted sample  additional confirmatory testing may be necessary for  epidemiological  and / or clinical management purposes  to differentiate between  SARS-CoV-2 and other Sarbecovirus currently known to infect humans.  If clinically indicated additional testing with an alternate test  methodology [phone]) is advised. The SARS-CoV-2 RNA is generally  detectable in upper and lower respiratory sp ecimens during the acute  phase of infection. The expected result is Negative. Fact Sheet for Patients:  BoilerBrush.com.cyhttps://www.fda.gov/media/136312/download Fact Sheet for Healthcare Providers: https://pope.com/https://www.fda.gov/media/136313/download This test is not yet approved or cleared by the [place] FDA and has been authorized for detection and/or diagnosis of SARS-CoV-2 by FDA under an Emergency Use Authorization (EUA).  This EUA will remain in effect (meaning this test can be used) for the duration of the COVID-19 declaration under Section 564(b)(1) of the Act, 21 U.S.C. section 360bbb-3(b)(1), unless the authorization is terminated or revoked sooner. Performed at [facility], [address] [place], [place] [address]          Radiology Studies: [facility]  Result Date: 04/23/2019 CLINICAL DATA:  Follow-up pleural effusion. EXAM: PORTABLE CHEST 1 VIEW COMPARISON:  04/18/2019 FINDINGS: The heart is enlarged. Recent abdominal CT scan showed four-chamber enlargement. Moderate central vascular congestion new mild interstitial pulmonary edema. Persistent left pleural effusion. The bony thorax is intact. IMPRESSION: Cardiac enlargement with CHF and left pleural effusion. Electronically Signed   By: Rudie MeyerP.  Gallerani M.D.   On: 04/23/2019 08:11        Scheduled Meds: . allopurinol  300 mg Oral Daily  . carvedilol  6.25 mg Oral BID WC  . enoxaparin (LOVENOX) injection  40 mg Subcutaneous Q24H  . furosemide  60 mg Intravenous Q12H  . pantoprazole  40 mg Oral Daily  . potassium chloride  40 mEq Oral BID  . pravastatin  40 mg Oral Daily  . sacubitril-valsartan  1  tablet Oral BID  . sodium chloride flush  3 mL Intravenous Once  . sodium chloride flush  3 mL Intravenous Q12H  . spironolactone  12.5 mg Oral Daily   Continuous Infusions: . sodium chloride       LOS: 4 days   Time spent= 35 mins     Joline Maxcyhirag , MD [facility]  If 7PM-7AM, please contact night-coverage [email or web address] 04/23/2019, 1:48 PM

## 2019-04-23 NOTE — Care Management (Signed)
  SECONDARY INS: MEDICAID [place] ACCESS  EFF-DATE: 11-23-2017  CO-PAY- $3.90 FOR EACH PRESCRIPTION    # 1. S/[facility] RX # [phone]    1. ENTRESTO 24-26 MG BID  COVER- YES  CO-PAY- 25 % OF TOTAL COST / Q/L TWO PILL PER DAY  TIER-3 DRUG  PRIOR APPROVAL- NO    2. ENTRESTO 49-51 MG BID  COVER- YES  CO-PAY 25 % OF TOTAL COST / Q/L TWO PILL PER DAY  TIER- 3 DRUG  PRIOR APPROVAL- NO    3. ENTRESTO 97 MG BID  COVER- YES  CO-PAY- 25 % OF TOTAL COST / Q/L TWO PILL PER DAY  TIER- 3 DRUG  PRIOR APPROVAL- NO    PREFERRED PHARMACY : YES  [facility] M/O   SECONDARY INS: MEDICAID [place] ACCESS  EFF-DATE: 11-23-2017  CO-PAY- $3.90 FOR EACH PRESCRIPTION

## 2019-04-23 NOTE — Progress Notes (Addendum)
Progress Note  Patient Name: Don [name] Date of Encounter: 04/23/2019  Primary Cardiologist: Nona DellSamuel McDowell, MD   Subjective   Breathing improved. No chest pain or palpitations. Has been ambulating back and forth to the restroom. Still with pitting edema.   Inpatient Medications    Scheduled Meds: . allopurinol  300 mg Oral Daily  . carvedilol  6.25 mg Oral BID WC  . enoxaparin (LOVENOX) injection  40 mg Subcutaneous Q24H  . furosemide  60 mg Intravenous Q12H  . pantoprazole  40 mg Oral Daily  . potassium chloride  40 mEq Oral BID  . pravastatin  40 mg Oral Daily  . sacubitril-valsartan  1 tablet Oral BID  . sodium chloride flush  3 mL Intravenous Once  . sodium chloride flush  3 mL Intravenous Q12H  . spironolactone  12.5 mg Oral Daily   Continuous Infusions: . sodium chloride    . magnesium sulfate bolus IVPB     PRN Meds: sodium chloride, acetaminophen **OR** acetaminophen, ondansetron **OR** ondansetron (ZOFRAN) IV, oxyCODONE, sodium chloride flush   Vital Signs    Vitals:   04/22/19 1343 04/22/19 1941 04/22/19 2111 04/23/19 0500  BP: 118/83  122/73   Pulse: 73  73   Resp: 16  17   Temp: 98.4 F (36.9 C)  98.1 F (36.7 C)   TempSrc: Oral  Oral   SpO2: 100% 100% 100%   Weight:    76.9 kg  Height:        Intake/Output Summary (Last 24 hours) at 04/23/2019 0812 Last data filed at 04/23/2019 0600 Gross per 24 hour  Intake 1713 ml  Output 3300 ml  Net -1587 ml    Last 3 Weights 04/23/2019 04/22/2019 04/21/2019  Weight (lbs) 169 lb 8.5 oz 177 lb 0.5 oz 180 lb 12.4 oz  Weight (kg) 76.9 kg 80.3 kg 82 kg      Telemetry    NSR, HR in 70's to 80's. Frequent PVC's and occasional episodes of ventricular bigeminy. - Personally Reviewed  ECG    No new tracings.   Physical Exam   General: Well developed, well nourished, male appearing in no acute distress. Head: Normocephalic, atraumatic.  Neck: Supple without bruits, JVD at 8 cm. Lungs:  Resp  regular and unlabored, mild rales along bases bilaterally. Heart: RRR, S1, S2, no S3, S4, or murmur; no rub. Abdomen: Soft, non-tender, non-distended with normoactive bowel sounds. No hepatomegaly. No rebound/guarding. No obvious abdominal masses. Extremities: No clubbing or cyanosis, 1+ pitting edema up to knees bilaterally. Distal pedal pulses are 2+ bilaterally. Neuro: Alert and oriented X 3. Moves all extremities spontaneously. Psych: Normal affect.  Labs    Chemistry Recent Labs  Lab 04/18/19 2328  04/19/19 0357  04/21/19 0609 04/22/19 0319 04/23/19 0411  NA  --    < > 142   < > 141 137 138  K  --    < > 3.6   < > 3.4* 3.2* 3.9  CL  --    < > 108   < > 102 100 97*  CO2  --    < > 27   < > 31 29 33*  GLUCOSE  --    < > 113*   < > 90 102* 103*  BUN  --    < > 25*   < > 21 19 15   CREATININE  --    < > 1.27*   < > 1.31* 1.24 1.22  CALCIUM  --    < >  8.7*   < > 8.8* 8.3* 8.7*  PROT 6.8  --  6.4*  --   --   --   --   ALBUMIN 3.3*  --  3.1*  --   --   --   --   AST 36  --  31  --   --   --   --   ALT 22  --  21  --   --   --   --   ALKPHOS 114  --  104  --   --   --   --   BILITOT 2.2*  --  2.1*  --   --   --   --   GFRNONAA  --    < > 56*   < > 54* 58* 59*  GFRAA  --    < > >60   < > >60 >60 >60  ANIONGAP  --    < > 7   < > 8 8 8    < > = values in this interval not displayed.     Hematology Recent Labs  Lab 04/20/19 0744 04/21/19 0609 04/23/19 0411  WBC 4.8 4.3 5.5  RBC 3.66* 3.56* 3.70*  HGB 12.0* 11.5* 12.1*  HCT 37.2* 36.1* 37.7*  MCV 101.6* 101.4* 101.9*  MCH 32.8 32.3 32.7  MCHC 32.3 31.9 32.1  RDW 15.5 15.4 14.5  PLT 167 149* 174    Cardiac EnzymesNo results for input(s): TROPONINI in the last 168 hours. No results for input(s): TROPIPOC in the last 168 hours.   BNP Recent Labs  Lab 04/18/19 2329  BNP 1,170.0*     DDimer No results for input(s): DDIMER in the last 168 hours.   Radiology    No results found.  Cardiac Studies   Echocardiogram:  04/19/2019 IMPRESSIONS   1. The left ventricle has severely reduced systolic function, with an ejection fraction of 20-25%. The cavity size was normal. There is mildly increased left ventricular wall thickness. Left ventricular diastolic Doppler parameters are indeterminate.  Left ventricular diffuse hypokinesis.  2. The right ventricle has moderately reduced systolic function. The cavity was moderately enlarged. There is no increase in right ventricular wall thickness. Right ventricular systolic pressure is moderately elevated with an estimated pressure of 57.8  mmHg.  3. Left atrial size was mildly dilated.  4. Right atrial size was severely dilated.  5. Large pleural effusion in the left lateral region.  6. Small pericardial effusion with moderate collection posteriorly.  7. The aortic valve is tricuspid. Mild calcification of the aortic valve. Aortic valve regurgitation is trivial by color flow Doppler. Moderate aortic annular calcification noted.  8. The mitral valve is grossly normal. There is mild mitral regurgitation.  9. The tricuspid valve is grossly normal. Tricuspid valve regurgitation is moderate. 10. The aorta is normal in size and structure. 11. The inferior vena cava was dilated in size with <50% respiratory variability.  Patient Profile     73 y.o. male w/ PMH of Hepatitis C, liver cirrhosis, HTN, HLD, Stage 3 CKD, and prior alcohol use who is currently admitted with a CHF exacerbation. Found to have a newly diagnosed cardiomyopathy with EF of 20-25%.   Assessment & Plan    1. Acute on Chronic Combined Systolic and Diastolic CHF - he presented with worsening dyspnea, abdominal distention and lower extremity edema. BNP elevated to 1170 and CXR consistent with CHF. Echo shows an EF of 20-25% as outlined above.  - he has  been receiving IV Lasix 60mg  BID with a net output of -11.1L thus far (-2.4 L net output within 24 hours) with weight having declined from 188 lbs on  admission to 169 lbs today. Unknown baseline. Edema improved on examination but still 1+ bilaterally. Would continue IV dosing for at least another 24 hours.  - given his cardiomyopathy, he will require ischemic evaluation at some point. By review of the initial consult, medical therapy has been recommended for now with plans for reassessment of EF following this and possible ischemic testing at that time. He has been started on Coreg, Spironolactone, and Entresto. Will ask Case Management to check co-pay for [facility] and provide 30-day card.   2. Elevated Cardiac Enzymes - he reports episodes of chest discomfort prior to admission when he was having difficulty breathing but denies any recurrent symptoms since admission. No recent exertional symptoms. Enzymes have been flat at 82, 80, 68, and 74. Most consistent with demand ischemia in the setting of his CHF and not consistent with ACS.  - plan is for medical management at this time and consideration of ischemic testing as an outpatient.   3. NSVT/ Ventricular Bigeminy - AM labs show Mg remains low at 1.8 and K+ 3.9. Will try to keep Mg at 2.0 and K+ at 4.0 given frequent ectopy and episodes of ventricular bigeminy on telemetry. TSH 6.777. Will check Free T4. Will order additional Mg.  - continue Coreg 6.25mg  BID. Once IV Lasix has been transitioned to PO Lasix, hopefully BP will allow for further titration of Coreg given his soft BP.   4. HTN - BP has been stable at 118/73 - 122/83 within the past 24 hours. Continue Coreg, Spironolactone, and Entresto.  5. HLD - FLP shows total cholesterol of 113, HDL 37, and LDL 60. Continue PTA Pravastatin 40mg  daily.   6. Stage 3 CKD - unknown baseline. At 1.34 on admission and stable at 1.22 today.   For questions or updates, please contact [place] Please consult [email or web address] for contact info under Cardiology/STEMI.   Arna Medici , PA-C 8:12 AM 04/23/2019 Pager:  [phone]  Attending note Patient seen and disucssed with PA Ahmed Prima, I agree with her docuemntation above. 74 yo male with acute systolic heart failure, new diagnosis this admission. Echo this admit LVEF 20-25%, moderate RV dysfunction, PASP 58. Neg 2.5L yesterday, negative 11.7L since admission, he is on lasix 60mg  IV bid. REnal function is stable, continue IV diuresis. From initial consult note no plans for ischemic testing at this time, can be considered as outpatient with f/u with Dr Domenic Polite.   He is on coreg 6.25mg  bid, entresto 24/26mg  bid, aldactone 12.5mg  daily. Titrate further as tolerated as outpatient.   Remains volume overloaded, we will continue IV diuresis today. He has good daily net negative output    Carlyle Dolly MD

## 2019-04-23 NOTE — Plan of Care (Signed)
  Problem: Acute Rehab PT Goals(only PT should resolve) Goal: Pt Will Go Supine/Side To Sit Outcome: Progressing Flowsheets (Taken 04/23/2019 1607) Pt will go Supine/Side to Sit:  Independently  with modified independence Goal: Patient Will Transfer Sit To/From Stand Outcome: Progressing Flowsheets (Taken 04/23/2019 1607) Patient will transfer sit to/from stand: with supervision Goal: Pt Will Transfer Bed To Chair/Chair To Bed Outcome: Progressing Flowsheets (Taken 04/23/2019 1607) Pt will Transfer Bed to Chair/Chair to Bed: with supervision Goal: Pt Will Ambulate Outcome: Progressing Flowsheets (Taken 04/23/2019 1607) Pt will Ambulate:  100 feet  with supervision  with rolling walker   4:08 PM, 04/23/19 Lonell Grandchild, MPT Physical Therapist with [facility] [phone] office [phone] mobile phone

## 2019-04-24 LAB — BASIC METABOLIC PANEL
Anion gap: 9 (ref 5–15)
BUN: 16 mg/dL (ref 8–23)
CO2: 32 mmol/L (ref 22–32)
Calcium: 8.7 mg/dL — ABNORMAL LOW (ref 8.9–10.3)
Chloride: 95 mmol/L — ABNORMAL LOW (ref 98–111)
Creatinine, Ser: 1.21 mg/dL (ref 0.61–1.24)
GFR calc Af Amer: >60 mL/min (ref >60)
GFR calc non Af Amer: 59 mL/min — ABNORMAL LOW (ref >60)
Glucose, Bld: 100 mg/dL — ABNORMAL HIGH (ref 70–99)
Potassium: 4.3 mmol/L (ref 3.5–5.1)
Sodium: 136 mmol/L (ref 135–145)

## 2019-04-24 LAB — CBC
HCT: 39.9 % (ref 39.0–52.0)
Hemoglobin: 12.8 g/dL — ABNORMAL LOW (ref 13.0–17.0)
MCH: 32.4 pg (ref 26.0–34.0)
MCHC: 32.1 g/dL (ref 30.0–36.0)
MCV: 101 fL — ABNORMAL HIGH (ref 80.0–100.0)
Platelets: 187 10*3/uL (ref 150–400)
RBC: 3.95 MIL/uL — ABNORMAL LOW (ref 4.22–5.81)
RDW: 14.4 % (ref 11.5–15.5)
WBC: 6.1 10*3/uL (ref 4.0–10.5)
nRBC: 0 % (ref 0.0–0.2)

## 2019-04-24 LAB — MAGNESIUM: Magnesium: 1.8 mg/dL (ref 1.7–2.4)

## 2019-04-24 MED ORDER — SPIRONOLACTONE 25 MG PO TABS: 12.5000 mg | ORAL_TABLET | Freq: Every day | ORAL | 1 refills | Status: DC

## 2019-04-24 MED ORDER — SACUBITRIL-VALSARTAN 24-26 MG PO TABS: 1.0000 | ORAL_TABLET | Freq: Two times a day (BID) | ORAL | 0 refills | Status: DC

## 2019-04-24 MED ORDER — FUROSEMIDE 40 MG PO TABS: 60.0000 mg | ORAL_TABLET | Freq: Two times a day (BID) | ORAL | Status: DC

## 2019-04-24 MED ORDER — CARVEDILOL 6.25 MG PO TABS: 6.2500 mg | ORAL_TABLET | Freq: Two times a day (BID) | ORAL | 0 refills | Status: DC

## 2019-04-24 MED ORDER — FUROSEMIDE 20 MG PO TABS: 60.0000 mg | ORAL_TABLET | Freq: Two times a day (BID) | ORAL | 0 refills | Status: DC

## 2019-04-24 NOTE — Plan of Care (Signed)

## 2019-04-24 NOTE — Progress Notes (Signed)
OT Cancellation Note  Patient Details Name: Don [name] MRN: [id number] DOB: [birth year]   Cancelled Treatment:    Reason Eval/Treat Not Completed: Patient at procedure or test/ unavailable . Pt eating breakfast on OT arrival, requested to return later.  Will attempt to see pt at a later time.   [name], OTR/L  [phone] 04/24/2019, 8:28 AM

## 2019-04-24 NOTE — Discharge Summary (Signed)
Physician Discharge Summary  DAION GINSBERG BJY:[id number] DOB: April 04, 1946 DOA: 04/18/2019  PCP: Oval Linsey, MD  Admit date: 04/18/2019 Discharge date: 04/24/2019  Admitted From: Home Disposition: Home with home health  Recommendations for Outpatient Follow-up:  1. Follow up with PCP in 1-2 weeks 2. Please obtain BMP/CBC in one week your next doctors visit.  3. Discontinue lisinopril, labetalol.  Start patient on Coreg, Entresto, Aldactone.  Lasix 60 mg twice daily.  Home Health: To be arranged by case manager Equipment/Devices: None Discharge Condition: Stable CODE STATUS: Full code Diet recommendation: Heart healthy  Brief/Interim Summary: 73 year old with history of essential hypertension, hepatitis C, hyperlipidemia, alcohol use with hepatitis C, liver cirrhosis, CKD stage III, anemia of chronic disease, thrombocytopenia came to the hospital with progressive shortness of breath and lower extremity edema over the course the past couple of days.  Patient was started on IV diuretics.  Over the course of several days of diuresis patient felt much better.  He opted for outpatient ischemic evaluation.  Cardiology discontinued labetalol and lisinopril, added Coreg and Entresto.  Aldactone was also added.  Lasix was adjusted to 60 mg orally twice daily. Stable for discharge today.   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  Will transition to Lasix 60 mg orally twice daily.  Follow-up outpatient closely.  Patient deferred outpatient for ischemic evaluation.  Left-sided pleural effusion -This is secondary to his congestive  heart failure.  Currently not symptomatic from this.  Feeling better.  We will transition to oral Lasix.  Essential hypertension - On Coreg, Entresto, Aldactone.  Also getting diuretics. Discontinued home lisinopril and labetalol.  History of alcohol use/hepatitis C with cirrhosis, compensated -Supportive care.  No evidence of hepatic encephalopathy.  CKD stage III -Creatinine is stable around 1.2  Anemia of chronic disease -Baseline around 11   Thrombocytopenia -Suspect secondary to underlying cirrhosis.  Without any evidence of obvious bleeding.  Some ambulatory dysfunction -PT recommended home health.  Arrangements made.  Consultations:  Cardiology  Subjective: Feels okay, no complaints.  Wishes to go home.  Discharge Exam: Vitals:   04/23/19 2106 04/24/19 0511  BP: 129/80 100/61  Pulse: 77 70  Resp: 17 17  Temp: 98.5 F (36.9 C) 98.6 F (37 C)  SpO2: 97% 99%   Vitals:   04/23/19 1952 04/23/19 2106 04/24/19 0317 04/24/19 0511  BP:  129/80  100/61  Pulse:  77  70  Resp:  17  17  Temp:  98.5 F (36.9 C)  98.6 F (37 C)  TempSrc:  Oral  Oral  SpO2: 98% 97%  99%  Weight:   74.7 kg   Height:        General: Pt is alert, awake, not in acute distress Cardiovascular: RRR, S1/S2 +, no rubs, no gallops Respiratory: Minimal bibasilar crackles Abdominal: Soft, NT, ND, bowel sounds + Extremities: no edema, no cyanosis  Discharge Instructions  Discharge Instructions    AMB referral to CHF clinic   Complete by: As directed    Call MD for:  difficulty breathing, headache or visual disturbances   Complete by: As directed    Call MD for:  persistant dizziness or light-headedness   Complete by: As directed    Call MD for:  persistant nausea and vomiting   Complete by: As directed    Diet - low sodium heart healthy   Complete by: As directed    Discharge instructions   Complete by: As directed    You were cared for by a hospitalist during your hospital  stay. If you have any questions about your discharge medications or the care you received while you were in the hospital after you are discharged, you can call the unit and asked to speak with the hospitalist on call if the hospitalist that took care of you is not available. Once you are discharged, your primary care physician will handle any further medical issues. Please note that NO REFILLS for any discharge medications will be authorized once you are discharged, as it is imperative that you return to your primary care physician (or establish a relationship with a primary care physician if you do not have one) for your aftercare needs so that they can reassess your need for medications and monitor your lab values.  Please request your Prim.MD to go over all Hospital Tests and Procedure/Radiological results at the follow up, please get all Hospital records sent to your Prim MD by signing hospital release before you go home.  Get CBC, CMP, 2 view Chest X ray checked  by Primary MD during your next visit or SNF MD in 5-7 days ( we routinely change or add medications that can affect your baseline labs and fluid status, therefore we recommend that you get the mentioned basic workup next visit with your PCP, your PCP may decide not to get them or add new tests based on their clinical decision)  On your next visit with your primary care physician please Get Medicines reviewed and adjusted.  If you experience worsening of your admission symptoms, develop shortness of breath, life threatening emergency, suicidal or homicidal thoughts you must seek medical attention immediately by calling 911 or calling your MD immediately  if symptoms less severe.  You Must read complete instructions/literature along with all the possible adverse reactions/side effects for all the Medicines you take and that have been prescribed to you. Take any new Medicines after you have completely understood and accpet all the possible  adverse reactions/side effects.   Do not drive, operate heavy machinery, perform activities at heights, swimming or participation in water activities or provide baby sitting services if your were admitted for syncope or siezures until you have seen by Primary MD or a Neurologist and advised to do so again.  Do not drive when taking Pain medications.   Increase activity slowly   Complete by: As directed      Allergies as of 04/24/2019      Reactions   Penicillins Swelling   Did it involve swelling of the face/tongue/throat, SOB, or low BP? Unknown Did it involve sudden or severe rash/hives, skin peeling, or any reaction on the inside of your mouth or nose? Unknown Did you need to seek medical attention at a hospital or doctor's office? Unknown When did it last happen? If all above answers are "NO", may proceed with cephalosporin use.      Medication List    STOP taking these medications   labetalol 200 MG tablet Commonly known as: NORMODYNE   lisinopril 20 MG tablet Commonly known as: ZESTRIL     TAKE these medications   albuterol 108 (90 Base) MCG/ACT inhaler Commonly  known as: VENTOLIN HFA Inhale 1-2 puffs into the lungs every 6 (six) hours as needed.   allopurinol 300 MG tablet Commonly known as: ZYLOPRIM Take 300 mg by mouth daily.   carvedilol 6.25 MG tablet Commonly known as: COREG Take 1 tablet (6.25 mg total) by mouth 2 (two) times daily with a meal.   furosemide 20 MG tablet Commonly known as: LASIX Take 3 tablets (60 mg total) by mouth 2 (two) times daily.   naproxen 500 MG tablet Commonly known as: NAPROSYN Take 500 mg by mouth 2 (two) times daily with a meal.   omeprazole 20 MG capsule Commonly known as: PRILOSEC Take 20 mg by mouth daily.   oxycodone 5 MG capsule Commonly known as: OXY-IR Take 5 mg by mouth every 4 (four) hours as needed for pain.   pravastatin 40 MG tablet Commonly known as: PRAVACHOL Take 40 mg by mouth daily.    sacubitril-valsartan 24-26 MG Commonly known as: ENTRESTO Take 1 tablet by mouth 2 (two) times daily.   spironolactone 25 MG tablet Commonly known as: ALDACTONE Take 0.5 tablets (12.5 mg total) by mouth daily. Start taking on: April 25, 2019      Follow-up Information    [facility].   Specialty: Home Health Services Why: Home Health PT       [place], [place], [place] Follow up on 05/13/2019.   Specialties: Physician Assistant, Cardiology Why: Cardiology Hospital Follow-Up on 05/13/2019 at 2:00.  Contact information: [address] [place] [address] [phone]          Allergies  Allergen Reactions  . Penicillins Swelling    Did it involve swelling of the face/tongue/throat, SOB, or low BP? Unknown Did it involve sudden or severe rash/hives, skin peeling, or any reaction on the inside of your mouth or nose? Unknown Did you need to seek medical attention at a hospital or doctor's office? Unknown When did it last happen? If all above answers are "NO", may proceed with cephalosporin use.     You were cared for by a hospitalist during your hospital stay. If you have any questions about your discharge medications or the care you received while you were in the hospital after you are discharged, you can call the unit and asked to speak with the hospitalist on call if the hospitalist that took care of you is not available. Once you are discharged, your primary care physician will handle any further medical issues. Please note that no refills for any discharge medications will be authorized once you are discharged, as it is imperative that you return to your primary care physician (or establish a relationship with a primary care physician if you do not have one) for your aftercare needs so that they can reassess your need for medications and monitor your lab values.   Procedures/Studies: Ct Abdomen Pelvis W Contrast  Result Date:  04/19/2019 CLINICAL DATA:  Abdominal distension EXAM: CT ABDOMEN AND PELVIS WITH CONTRAST TECHNIQUE: Multidetector CT imaging of the abdomen and pelvis was performed using the standard protocol following bolus administration of intravenous contrast. CONTRAST:  100mL OMNIPAQUE IOHEXOL 300 MG/ML  SOLN COMPARISON:  None. FINDINGS: Lower chest: There is a small right-sided pleural effusion there is a small to moderate-sized partially visualized left-sided pleural effusion.The heart is significantly enlarged. There is a small to moderate-sized pericardial effusion. There is significant reflux of contrast into the IVC. Hepatobiliary: The liver demonstrates a slightly nodular contour. Normal gallbladder.There is no biliary  ductal dilation. Pancreas: Normal contours without ductal dilatation. No peripancreatic fluid collection. Spleen: No splenic laceration or hematoma. Adrenals/Urinary Tract: --Adrenal glands: No adrenal hemorrhage. --Right kidney/ureter: No hydronephrosis or perinephric hematoma. --Left kidney/ureter: No hydronephrosis or perinephric hematoma. --Urinary bladder: Unremarkable. Stomach/Bowel: --Stomach/Duodenum: No hiatal hernia or other gastric abnormality. Normal duodenal course and caliber. --Small bowel: No dilatation or inflammation. --Colon: There are scattered colonic diverticula without CT evidence of diverticulitis. --Appendix: Normal. Vascular/Lymphatic: Atherosclerotic calcification is present within the non-aneurysmal abdominal aorta, without hemodynamically significant stenosis. --No retroperitoneal lymphadenopathy. --No mesenteric lymphadenopathy. --No pelvic or inguinal lymphadenopathy. Reproductive: Unremarkable Other: There is a small volume of abdominal ascites. There is anasarca. Musculoskeletal. Multilevel degenerative disc disease and facet arthrosis. No bony spinal canal stenosis. IMPRESSION: 1. Significant cardiomegaly with reflux of contrast into the IVC consistent with underlying  cardiac dysfunction. 2. Small right and probable moderate partially visualized left-sided pleural effusions. 3. Small to moderate-sized pericardial effusion. 4. Small volume abdominal ascites. 5. Anasarca. 6. Slightly nodular contour of the liver which can be seen in patients with underlying cirrhosis. Electronically Signed   By: Constance Holster M.D.   On: 04/19/2019 01:44   Dg Chest Port 1 View  Result Date: 04/23/2019 CLINICAL DATA:  Follow-up pleural effusion. EXAM: PORTABLE CHEST 1 VIEW COMPARISON:  04/18/2019 FINDINGS: The heart is enlarged. Recent abdominal CT scan showed four-chamber enlargement. Moderate central vascular congestion new mild interstitial pulmonary edema. Persistent left pleural effusion. The bony thorax is intact. IMPRESSION: Cardiac enlargement with CHF and left pleural effusion. Electronically Signed   By: Marijo Sanes M.D.   On: 04/23/2019 08:11   Dg Chest Portable 1 View  Result Date: 04/18/2019 CLINICAL DATA:  73 year old male with shortness of breath. EXAM: PORTABLE CHEST 1 VIEW COMPARISON:  Chest radiograph dated 07/08/2014 FINDINGS: Small to moderate left pleural effusion with associated left lung base atelectasis. Infiltrate is not excluded. Clinical correlation is recommended. The right lung is clear. There is no pneumothorax. There is apparent cardiomegaly. Evaluation of the heart is however limited as the left cardiac border is silhouetted by the left lung base density. No acute osseous pathology. IMPRESSION: 1. Left pleural effusion with left lung base atelectasis versus infiltrate. 2. Cardiomegaly. Electronically Signed   By: Anner Crete M.D.   On: 04/18/2019 23:30      The results of significant diagnostics from this hospitalization (including imaging, microbiology, ancillary and laboratory) are listed below for reference.     Microbiology: Recent Results (from the past 240 hour(s))  SARS Coronavirus 2 (CEPHEID - Performed in [place] hospital  lab), Hosp Order     Status: None   Collection Time: 04/18/19 11:46 PM   Specimen: Nasopharyngeal Swab  Result Value Ref Range Status   SARS Coronavirus 2 NEGATIVE NEGATIVE Final    Comment: (NOTE) If result is NEGATIVE SARS-CoV-2 target nucleic acids are NOT DETECTED. The SARS-CoV-2 RNA is generally detectable in upper and lower  respiratory specimens during the acute phase of infection. The lowest  concentration of SARS-CoV-2 viral copies this assay can detect is 250  copies / mL. A negative result does not preclude SARS-CoV-2 infection  and should not be used as the sole basis for treatment or other  patient management decisions.  A negative result may occur with  improper specimen collection / handling, submission of specimen other  than nasopharyngeal swab, presence of viral mutation(s) within the  areas targeted by this assay, and inadequate number of viral copies  (<250 copies / mL). A  negative result must be combined with clinical  observations, patient history, and epidemiological information. If result is POSITIVE SARS-CoV-2 target nucleic acids are DETECTED. The SARS-CoV-2 RNA is generally detectable in upper and lower  respiratory specimens dur ing the acute phase of infection.  Positive  results are indicative of active infection with SARS-CoV-2.  Clinical  correlation with patient history and other diagnostic information is  necessary to determine patient infection status.  Positive results do  not rule out bacterial infection or co-infection with other viruses. If result is PRESUMPTIVE POSTIVE SARS-CoV-2 nucleic acids MAY BE PRESENT.   A presumptive positive result was obtained on the submitted specimen  and confirmed on repeat testing.  While 2019 novel coronavirus  (SARS-CoV-2) nucleic acids may be present in the submitted sample  additional confirmatory testing may be necessary for epidemiological  and / or clinical management purposes  to differentiate between   SARS-CoV-2 and other Sarbecovirus currently known to infect humans.  If clinically indicated additional testing with an alternate test  methodology [phone]) is advised. The SARS-CoV-2 RNA is generally  detectable in upper and lower respiratory sp ecimens during the acute  phase of infection. The expected result is Negative. Fact Sheet for Patients:  BoilerBrush.com.cy Fact Sheet for Healthcare Providers: https://pope.com/ This test is not yet approved or cleared by the [place] FDA and has been authorized for detection and/or diagnosis of SARS-CoV-2 by FDA under an Emergency Use Authorization (EUA).  This EUA will remain in effect (meaning this test can be used) for the duration of the COVID-19 declaration under Section 564(b)(1) of the Act, 21 U.S.C. section 360bbb-3(b)(1), unless the authorization is terminated or revoked sooner. Performed at [facility], [address] [place], [place] [address]      Labs: BNP (last 3 results) Recent Labs    04/18/19 2329  BNP 1,170.0*   Basic Metabolic Panel: Recent Labs  Lab 04/20/19 0744 04/21/19 0609 04/22/19 0319 04/23/19 0411 04/24/19 0557  NA 141 141 137 138 136  K 3.4* 3.4* 3.2* 3.9 4.3  CL 103 102 100 97* 95*  CO2 28 31 29  33* 32  GLUCOSE 89 90 102* 103* 100*  BUN 22 21 19 15 16   CREATININE 1.40* 1.31* 1.24 1.22 1.21  CALCIUM 8.8* 8.8* 8.3* 8.7* 8.7*  MG 1.6* 1.8 1.8 1.8 1.8   Liver Function Tests: Recent Labs  Lab 04/18/19 2328 04/19/19 0357  AST 36 31  ALT 22 21  ALKPHOS 114 104  BILITOT 2.2* 2.1*  PROT 6.8 6.4*  ALBUMIN 3.3* 3.1*   No results for input(s): LIPASE, AMYLASE in the last 168 hours. No results for input(s): AMMONIA in the last 168 hours. CBC: Recent Labs  Lab 04/19/19 0357 04/20/19 0744 04/21/19 0609 04/23/19 0411 04/24/19 0557  WBC 4.1 4.8 4.3 5.5 6.1  HGB 12.4* 12.0* 11.5* 12.1* 12.8*  HCT 39.0 37.2* 36.1* 37.7* 39.9  MCV 102.4*  101.6* 101.4* 101.9* 101.0*  PLT 146* 167 149* 174 187   Cardiac Enzymes: No results for input(s): CKTOTAL, CKMB, CKMBINDEX, TROPONINI in the last 168 hours. BNP: Invalid input(s): POCBNP CBG: No results for input(s): GLUCAP in the last 168 hours. D-Dimer No results for input(s): DDIMER in the last 72 hours. Hgb A1c No results for input(s): HGBA1C in the last 72 hours. Lipid Profile Recent Labs    04/23/19 0411  CHOL 113  HDL 37*  LDLCALC 60  TRIG 81  CHOLHDL 3.1   Thyroid function studies Recent Labs  04/23/19 0411  TSH 6.777*   Anemia work up No results for input(s): VITAMINB12, FOLATE, FERRITIN, TIBC, IRON, RETICCTPCT in the last 72 hours. Urinalysis    Component Value Date/Time   COLORURINE AMBER (A) 02/01/2012 1300   APPEARANCEUR CLEAR 02/01/2012 1300   LABSPEC 1.015 02/01/2012 1300   PHURINE 6.5 02/01/2012 1300   GLUCOSEU NEGATIVE 02/01/2012 1300   HGBUR NEGATIVE 02/01/2012 1300   BILIRUBINUR SMALL (A) 02/01/2012 1300   KETONESUR NEGATIVE 02/01/2012 1300   PROTEINUR NEGATIVE 02/01/2012 1300   UROBILINOGEN 1.0 02/01/2012 1300   NITRITE NEGATIVE 02/01/2012 1300   LEUKOCYTESUR NEGATIVE 02/01/2012 1300   Sepsis Labs Invalid input(s): PROCALCITONIN,  WBC,  LACTICIDVEN Microbiology Recent Results (from the past 240 hour(s))  SARS Coronavirus 2 ([facility]HEID - [facility]), Hosp Order     Status: None   Collection Time: 04/18/19 11:46 PM   Specimen: Nasopharyngeal Swab  Result Value Ref Range Status   SARS Coronavirus 2 NEGATIVE NEGATIVE Final    Comment: (NOTE) If result is NEGATIVE SARS-CoV-2 target nucleic acids are NOT DETECTED. The SARS-CoV-2 RNA is generally detectable in upper and lower  respiratory specimens during the acute phase of infection. The lowest  concentration of SARS-CoV-2 viral copies this assay can detect is 250  copies / mL. A negative result does not preclude SARS-CoV-2 infection  and should not be used as the  sole basis for treatment or other  patient management decisions.  A negative result may occur with  improper specimen collection / handling, submission of specimen other  than nasopharyngeal swab, presence of viral mutation(s) within the  areas targeted by this assay, and inadequate number of viral copies  (<250 copies / mL). A negative result must be combined with clinical  observations, patient history, and epidemiological information. If result is POSITIVE SARS-CoV-2 target nucleic acids are DETECTED. The SARS-CoV-2 RNA is generally detectable in upper and lower  respiratory specimens dur ing the acute phase of infection.  Positive  results are indicative of active infection with SARS-CoV-2.  Clinical  correlation with patient history and other diagnostic information is  necessary to determine patient infection status.  Positive results do  not rule out bacterial infection or co-infection with other viruses. If result is PRESUMPTIVE POSTIVE SARS-CoV-2 nucleic acids MAY BE PRESENT.   A presumptive positive result was obtained on the submitted specimen  and confirmed on repeat testing.  While 2019 novel coronavirus  (SARS-CoV-2) nucleic acids may be present in the submitted sample  additional confirmatory testing may be necessary for epidemiological  and / or clinical management purposes  to differentiate between  SARS-CoV-2 and other Sarbecovirus currently known to infect humans.  If clinically indicated additional testing with an alternate test  methodology [phone]([id number]) is advised. The SARS-CoV-2 RNA is generally  detectable in upper and lower respiratory sp ecimens during the acute  phase of infection. The expected result is Negative. Fact Sheet for Patients:  BoilerBrush.com.cyhttps://www.fda.gov/media/136312/download Fact Sheet for Healthcare Providers: https://pope.com/https://www.fda.gov/media/136313/download This test is not yet approved or cleared by the [place] FDA and has been authorized for detection  and/or diagnosis of SARS-CoV-2 by FDA under an Emergency Use Authorization (EUA).  This EUA will remain in effect (meaning this test can be used) for the duration of the COVID-19 declaration under Section 564(b)(1) of the Act, 21 U.S.C. section 360bbb-3(b)(1), unless the authorization is terminated or revoked sooner. Performed at [facility], [address] [place], [place] [address]      Time  coordinating discharge:  I have spent 35 minutes face to face with the patient and on the ward discussing the patients care, assessment, plan and disposition with other care givers. >50% of the time was devoted counseling the patient about the risks and benefits of treatment/Discharge disposition and coordinating care.   SIGNED:   Dimple Nanas, MD  [facility] 04/24/2019, 11:13 AM   If 7PM-7AM, please contact night-coverage [email or web address]

## 2019-04-24 NOTE — Progress Notes (Signed)
Progress Note  Patient Name: Don [name] Date of Encounter: 04/24/2019  Primary Cardiologist: Nona Dell, MD   Subjective   Denies any SOB  Inpatient Medications    Scheduled Meds:  allopurinol  300 mg Oral Daily   carvedilol  6.25 mg Oral BID WC   enoxaparin (LOVENOX) injection  40 mg Subcutaneous Q24H   furosemide  60 mg Intravenous Q12H   pantoprazole  40 mg Oral Daily   potassium chloride  40 mEq Oral BID   pravastatin  40 mg Oral Daily   sacubitril-valsartan  1 tablet Oral BID   sodium chloride flush  3 mL Intravenous Once   sodium chloride flush  3 mL Intravenous Q12H   spironolactone  12.5 mg Oral Daily   Continuous Infusions:  sodium chloride     PRN Meds: sodium chloride, acetaminophen **OR** acetaminophen, ondansetron **OR** ondansetron (ZOFRAN) IV, oxyCODONE, polyethylene glycol, senna-docusate, sodium chloride flush   Vital Signs    Vitals:   04/23/19 1952 04/23/19 2106 04/24/19 0317 04/24/19 0511  BP:  129/80  100/61  Pulse:  77  70  Resp:  17  17  Temp:  98.5 F (36.9 C)  98.6 F (37 C)  TempSrc:  Oral  Oral  SpO2: 98% 97%  99%  Weight:   74.7 kg   Height:        Intake/Output Summary (Last 24 hours) at 04/24/2019 0859 Last data filed at 04/24/2019 0500 Gross per 24 hour  Intake 840 ml  Output 4450 ml  Net -3610 ml   Last 3 Weights 04/24/2019 04/23/2019 04/22/2019  Weight (lbs) 164 lb 10.9 oz 169 lb 8.5 oz 177 lb 0.5 oz  Weight (kg) 74.7 kg 76.9 kg 80.3 kg      Telemetry    SR- Personally Reviewed  ECG    n/a - Personally Reviewed  Physical Exam   GEN: No acute distress.   Neck: mildly elevated JVD Cardiac: RRR, no murmurs, rubs, or gallops.  Respiratory: Clear to auscultation bilaterally. GI: Soft, nontender, non-distended  MS: Trace bialteral edema; No deformity. Neuro:  Nonfocal  Psych: Normal affect   Labs    High Sensitivity Troponin:   Recent Labs  Lab 04/18/19 2328 04/19/19 0117 04/19/19 0357  04/19/19 1006  TROPONINIHS 82* 80* 68* 74*      Cardiac EnzymesNo results for input(s): TROPONINI in the last 168 hours. No results for input(s): TROPIPOC in the last 168 hours.   Chemistry Recent Labs  Lab 04/18/19 2328  04/19/19 0357  04/22/19 0319 04/23/19 0411 04/24/19 0557  NA  --    < > 142   < > 137 138 136  K  --    < > 3.6   < > 3.2* 3.9 4.3  CL  --    < > 108   < > 100 97* 95*  CO2  --    < > 27   < > 29 33* 32  GLUCOSE  --    < > 113*   < > 102* 103* 100*  BUN  --    < > 25*   < > 19 15 16   CREATININE  --    < > 1.27*   < > 1.24 1.22 1.21  CALCIUM  --    < > 8.7*   < > 8.3* 8.7* 8.7*  PROT 6.8  --  6.4*  --   --   --   --   ALBUMIN 3.3*  --  3.1*  --   --   --   --   AST 36  --  31  --   --   --   --   ALT 22  --  21  --   --   --   --   ALKPHOS 114  --  104  --   --   --   --   BILITOT 2.2*  --  2.1*  --   --   --   --   GFRNONAA  --    < > 56*   < > 58* 59* 59*  GFRAA  --    < > >60   < > >60 >60 >60  ANIONGAP  --    < > 7   < > 8 8 9    < > = values in this interval not displayed.     Hematology Recent Labs  Lab 04/21/19 0609 04/23/19 0411 04/24/19 0557  WBC 4.3 5.5 6.1  RBC 3.56* 3.70* 3.95*  HGB 11.5* 12.1* 12.8*  HCT 36.1* 37.7* 39.9  MCV 101.4* 101.9* 101.0*  MCH 32.3 32.7 32.4  MCHC 31.9 32.1 32.1  RDW 15.4 14.5 14.4  PLT 149* 174 187    BNP Recent Labs  Lab 04/18/19 2329  BNP 1,170.0*     DDimer No results for input(s): DDIMER in the last 168 hours.   Radiology    Dg Chest Port 1 View  Result Date: 04/23/2019 CLINICAL DATA:  Follow-up pleural effusion. EXAM: PORTABLE CHEST 1 VIEW COMPARISON:  04/18/2019 FINDINGS: The heart is enlarged. Recent abdominal CT scan showed four-chamber enlargement. Moderate central vascular congestion new mild interstitial pulmonary edema. Persistent left pleural effusion. The bony thorax is intact. IMPRESSION: Cardiac enlargement with CHF and left pleural effusion. Electronically Signed   By: Marijo Sanes M.D.   On: 04/23/2019 08:11    Cardiac Studies     Patient Profile        73 y.o. male w/ PMH of Hepatitis C, liver cirrhosis, HTN, HLD, Stage 3 CKD, and prior alcohol use who is currently admitted with a CHF exacerbation. Found to have a newly diagnosed cardiomyopathy with EF of 20-25%.  Assessment & Plan    1. Acute sysotlic HF - new diagnosis this admission - Echo this admit LVEF 20-25%, moderate RV dysfunction, PASP 58.  - From initial consult note no plans for ischemic testing at this time, can be considered as outpatient with f/u with Dr Domenic Polite  - negative 2.4 L, negative 14.7L this admission. He is on lasix 60mg  bid IV, renal function is stable. - He is on coreg 6.25mg  bid, entresto 24/26mg  bid, aldactone 12.5mg  daily. Titrate further as tolerated as outpatient.  - from PT note ambulated on room air, sats 89-92%. Recs for SNF at discharge.   - still some mild volume overload, significant diuresis this admission. I think reasonable to change to oral diuretic and continue diuresis as outpatient. D/C IV lasix, start lasix oral 60mg  bid. May be able to downtitrate at follow up, will need labs at f/u.   [facility] will sign off.   Medication Recommendations:  Lasix oral 60mg  bid Other recommendations (labs, testing, etc):  n/a Follow up as an outpatient:  We will arrange outpatient f/u 2 weeks.    For questions or updates, please contact [place] Please consult [email or web address] for contact info under        Signed, Carlyle Dolly, MD  04/24/2019, 8:59 AM

## 2019-05-02 ENCOUNTER — Ambulatory Visit: Payer: Medicare Other | Admitting: Family

## 2019-05-13 ENCOUNTER — Ambulatory Visit (INDEPENDENT_AMBULATORY_CARE_PROVIDER_SITE_OTHER): Payer: Medicare Other | Admitting: Student

## 2019-05-13 ENCOUNTER — Other Ambulatory Visit: Payer: Self-pay

## 2019-05-13 ENCOUNTER — Other Ambulatory Visit (HOSPITAL_COMMUNITY)
Admission: RE | Admit: 2019-05-13 | Discharge: 2019-05-13 | Disposition: A | Discharge status: Home / Self Care | Payer: Medicare Other | Source: Ambulatory Visit | Attending: Student | Admitting: Student

## 2019-05-13 ENCOUNTER — Encounter: Payer: Self-pay | Admitting: Student

## 2019-05-13 VITALS — BP 120/70 | Temp 97.7°F | Ht 70.0 in | Wt 167.0 lb

## 2019-05-13 DIAGNOSIS — I5042 Chronic combined systolic (congestive) and diastolic (congestive) heart failure: Secondary | ICD-10-CM

## 2019-05-13 DIAGNOSIS — I10 Essential (primary) hypertension: Secondary | ICD-10-CM | POA: Diagnosis not present

## 2019-05-13 DIAGNOSIS — E782 Mixed hyperlipidemia: Secondary | ICD-10-CM | POA: Diagnosis not present

## 2019-05-13 DIAGNOSIS — N183 Chronic kidney disease, stage 3 unspecified: Secondary | ICD-10-CM

## 2019-05-13 DIAGNOSIS — Z79899 Other long term (current) drug therapy: Secondary | ICD-10-CM | POA: Insufficient documentation

## 2019-05-13 LAB — BASIC METABOLIC PANEL
Anion gap: 8 (ref 5–15)
BUN: 22 mg/dL (ref 8–23)
CO2: 30 mmol/L (ref 22–32)
Calcium: 9.1 mg/dL (ref 8.9–10.3)
Chloride: 101 mmol/L (ref 98–111)
Creatinine, Ser: 1.07 mg/dL (ref 0.61–1.24)
GFR calc Af Amer: >60 mL/min (ref >60)
GFR calc non Af Amer: >60 mL/min (ref >60)
Glucose, Bld: 104 mg/dL — ABNORMAL HIGH (ref 70–99)
Potassium: 3.6 mmol/L (ref 3.5–5.1)
Sodium: 139 mmol/L (ref 135–145)

## 2019-05-13 MED ORDER — SACUBITRIL-VALSARTAN 24-26 MG PO TABS: 1.0000 | ORAL_TABLET | Freq: Two times a day (BID) | ORAL | 3 refills | Status: AC

## 2019-05-13 MED ORDER — CARVEDILOL 6.25 MG PO TABS: 6.2500 mg | ORAL_TABLET | Freq: Two times a day (BID) | ORAL | 3 refills | Status: DC

## 2019-05-13 MED ORDER — SPIRONOLACTONE 25 MG PO TABS: 12.5000 mg | ORAL_TABLET | Freq: Every day | ORAL | 3 refills | Status: DC

## 2019-05-13 MED ORDER — FUROSEMIDE 20 MG PO TABS: 60.0000 mg | ORAL_TABLET | Freq: Two times a day (BID) | ORAL | 3 refills | Status: DC

## 2019-05-13 NOTE — Progress Notes (Signed)
Cardiology Office Note    Date:  05/13/2019   ID:  Don JarredLewis R Holloran, DOB 05/13/1946, MRN [id number]  PCP:  Oval Linseyondiego, Richard, MD  Cardiologist: Nona DellSamuel McDowell, MD    Chief Complaint  Patient presents with  . Hospitalization Follow-up    History of Present Illness:    Don [name] is a 73 y.o. male with past medical history of Hepatitis C, liver cirrhosis, HTN, HLD, Stage III CKD, and prior alcohol use who presents to the office today for hospital follow-up.  He was most recently [facility] from 7/24 - 04/24/2019 for evaluation of worsening dyspnea and lower extremity edema. BNP was elevated to 1170 and echocardiogram showed a reduced EF of 20 to 25%. Given his multiple medical issues, medical therapy was recommended at that time with reassessment of his EF in 3 months with anticipation of ischemic evaluation if EF had not improved. He responded well to IV Lasix during admission and was overall -14.8L. Was discharged on Coreg 6.25mg  BID, Entresto 24-26mg  BID, and Aldactone 12.5mg  daily along with Lasix 60mg  BID. Weight was 169 lbs at discharge with creatinine stable at 1.21.  In talking with the patient today, he reports overall doing well since hospital discharge.  Says that breathing has been at baseline and he denies any recent dyspnea on exertion, orthopnea, PND, or lower extremity edema. Does not weigh himself daily. No recent chest pain or palpitations.   He reports good compliance with his medication regimen and his sister has been helping with this. She comes with him to his office visit today and is able to confirm he has been compliant with medications.   Past Medical History:  Diagnosis Date  . Chronic combined systolic (congestive) and diastolic (congestive) heart failure (HCC)    a. EF 20 to 25% by echo in 03/2019.  Marland Kitchen. Gout   . Hepatitis C   . High cholesterol   . Hypertension     History reviewed. No pertinent surgical history.  Current Medications:  Outpatient Medications Prior to Visit  Medication Sig Dispense Refill  . allopurinol (ZYLOPRIM) 300 MG tablet Take 300 mg by mouth daily.     . naproxen (NAPROSYN) 500 MG tablet Take 500 mg by mouth 2 (two) times daily with a meal.     . omeprazole (PRILOSEC) 20 MG capsule Take 20 mg by mouth daily.     Marland Kitchen. oxycodone (OXY-IR) 5 MG capsule Take 5 mg by mouth every 4 (four) hours as needed for pain.     . pravastatin (PRAVACHOL) 40 MG tablet Take 40 mg by mouth daily.    . carvedilol (COREG) 6.25 MG tablet Take 1 tablet (6.25 mg total) by mouth 2 (two) times daily with a meal. 120 tablet 0  . furosemide (LASIX) 20 MG tablet Take 3 tablets (60 mg total) by mouth 2 (two) times daily. 360 tablet 0  . sacubitril-valsartan (ENTRESTO) 24-26 MG Take 1 tablet by mouth 2 (two) times daily. 120 tablet 0  . spironolactone (ALDACTONE) 25 MG tablet Take 0.5 tablets (12.5 mg total) by mouth daily. 15 tablet 1  . albuterol (VENTOLIN HFA) 108 (90 Base) MCG/ACT inhaler Inhale 1-2 puffs into the lungs every 6 (six) hours as needed.      No facility-administered medications prior to visit.      Allergies:   Penicillins   Social History   Socioeconomic History  . Marital status: Single    Spouse name: Not on file  . Number of  children: Not on file  . Years of education: Not on file  . Highest education level: Not on file  Occupational History  . Not on file  Social Needs  . Financial resource strain: Not on file  . Food insecurity    Worry: Not on file    Inability: Not on file  . Transportation needs    Medical: Not on file    Non-medical: Not on file  Tobacco Use  . Smoking status: Former Games developermoker  . Smokeless tobacco: Never Used  . Tobacco comment: years ago  Substance and Sexual Activity  . Alcohol use: No    Alcohol/week: 0.0 standard drinks    Comment: none in several years.   . Drug use: Yes    Comment: stopped 2-3 yrs ago  . Sexual activity: Not on file  Lifestyle  . Physical activity     Days per week: Not on file    Minutes per session: Not on file  . Stress: Not on file  Relationships  . Social Musicianconnections    Talks on phone: Not on file    Gets together: Not on file    Attends religious service: Not on file    Active member of club or organization: Not on file    Attends meetings of clubs or organizations: Not on file    Relationship status: Not on file  Other Topics Concern  . Not on file  Social History Narrative  . Not on file     Family History:  The patient's family history includes Hypertension in his sister.   Review of Systems:   Please see the history of present illness.     General:  No chills, fever, night sweats or weight changes.  Cardiovascular:  No chest pain, dyspnea on exertion, edema, orthopnea, palpitations, paroxysmal nocturnal dyspnea. Dermatological: No rash, lesions/masses Respiratory: No cough, dyspnea Urologic: No hematuria, dysuria Abdominal:   No nausea, vomiting, diarrhea, bright red blood per rectum, melena, or hematemesis Neurologic:  No visual changes, wkns, changes in mental status.  He denies any of the above symptoms.   All other systems reviewed and are otherwise negative except as noted above.   Physical Exam:    VS:  BP 120/70   Temp 97.7 F (36.5 C)   Ht 5\' 10"  (1.778 m)   Wt 167 lb (75.8 kg)   BMI 23.96 kg/m    General: Well developed, well nourished,male appearing in no acute distress. Head: Normocephalic, atraumatic, sclera non-icteric, no xanthomas, nares are without discharge.  Neck: No carotid bruits. JVD not elevated.  Lungs: Respirations regular and unlabored, without wheezes or rales.  Heart: Regular rate and rhythm. No S3 or S4.  No murmur, no rubs, or gallops appreciated. Abdomen: Soft, non-tender, non-distended with normoactive bowel sounds. No hepatomegaly. No rebound/guarding. No obvious abdominal masses. Msk:  Strength and tone appear normal for age. No joint deformities or effusions.  Extremities: No clubbing or cyanosis. No lower extremity edema.  Distal pedal pulses are 2+ bilaterally. Neuro: Alert and oriented X 3. Moves all extremities spontaneously. No focal deficits noted. Psych:  Responds to questions appropriately with a normal affect. Skin: No rashes or lesions noted  Wt Readings from Last 3 Encounters:  05/13/19 167 lb (75.8 kg)  04/24/19 164 lb 10.9 oz (74.7 kg)  03/02/16 182 lb 4.8 oz (82.7 kg)     Studies/Labs Reviewed:   EKG:  EKG is not ordered today.   Recent Labs: 04/18/2019: B Natriuretic Peptide 1,170.0  04/19/2019: ALT 21 04/23/2019: TSH 6.777 04/24/2019: Hemoglobin 12.8; Magnesium 1.8; Platelets 187 05/13/2019: BUN 22; Creatinine, Ser 1.07; Potassium 3.6; Sodium 139   Lipid Panel    Component Value Date/Time   CHOL 113 04/23/2019 0411   TRIG 81 04/23/2019 0411   HDL 37 (L) 04/23/2019 0411   CHOLHDL 3.1 04/23/2019 0411   VLDL 16 04/23/2019 0411   LDLCALC 60 04/23/2019 0411    Additional studies/ records that were reviewed today include:   Echocardiogram: 04/19/2019 IMPRESSIONS    1. The left ventricle has severely reduced systolic function, with an ejection fraction of 20-25%. The cavity size was normal. There is mildly increased left ventricular wall thickness. Left ventricular diastolic Doppler parameters are indeterminate.  Left ventricular diffuse hypokinesis.  2. The right ventricle has moderately reduced systolic function. The cavity was moderately enlarged. There is no increase in right ventricular wall thickness. Right ventricular systolic pressure is moderately elevated with an estimated pressure of 57.8  mmHg.  3. Left atrial size was mildly dilated.  4. Right atrial size was severely dilated.  5. Large pleural effusion in the left lateral region.  6. Small pericardial effusion with moderate collection posteriorly.  7. The aortic valve is tricuspid. Mild calcification of the aortic valve. Aortic valve regurgitation is  trivial by color flow Doppler. Moderate aortic annular calcification noted.  8. The mitral valve is grossly normal. There is mild mitral regurgitation.  9. The tricuspid valve is grossly normal. Tricuspid valve regurgitation is moderate. 10. The aorta is normal in size and structure. 11. The inferior vena cava was dilated in size with <50% respiratory variability.  Assessment:    1. Chronic combined systolic and diastolic heart failure (HCC)   2. Medication management   3. Essential hypertension   4. Mixed hyperlipidemia   5. CKD (chronic kidney disease) stage 3, GFR 30-59 ml/min (HCC)      Plan:   In order of problems listed above:  1. Chronic Combined Systolic and Diastolic CHF - The patient was recently found to have a reduced EF of 20 to 25% by echocardiogram as outlined above with plans for medical therapy and reassessment of EF in 3 months and consideration of ischemic testing at that time. He is unsure if he would want to undergo a cardiac catheterization but would consider stress testing.  - He has overall done well since discharge and denies any recent symptoms.  Appears euvolemic by examination today. - Continue current regimen with Coreg 6.25mg  BID, Entresto 24-26mg  BID, Spironolactone 12.5mg  daily, and Lasix 60mg  BID. Sodium and fluid restriction reviewed. Will recheck BMET today. Pending results, consider further titration of Spironolactone. Not sure BP would allow for adjustment of Entresto and he does not have a BP cuff at home. Will plan for a repeat echo in 3 months then follow-up with Dr. Diona Browner afterwards.   2. HTN - BP is well controlled at 120/70 during today's visit.  Continue Coreg, Entresto, and Aldactone at current dosing.  3. HLD - Followed by PCP. He remains on Pravastatin 40 mg daily.  4. Stage 3 CKD - Creatinine was stable at 1.21 at the time of hospital discharge.  Will recheck BMET today.    Medication Adjustments/Labs and Tests Ordered: Current  medicines are reviewed at length with the patient today.  Concerns regarding medicines are outlined above.  Medication changes, Labs and Tests ordered today are listed in the Patient Instructions below. Patient Instructions  Medication Instructions:  Your physician recommends that you continue  on your current medications as directed. Please refer to the Current Medication list given to you today.  If you need a refill on your cardiac medications before your next appointment, please call your pharmacy.   Lab work: Your physician recommends that you return for lab work in: Today   If you have labs (blood work) drawn today and your tests are completely normal, you will receive your results only by: Marland Kitchen MyChart Message (if you have MyChart) OR . A paper copy in the mail If you have any lab test that is abnormal or we need to change your treatment, we will call you to review the results.  Testing/Procedures: Your physician has requested that you have an echocardiogram. Echocardiography is a painless test that uses sound waves to create images of your heart. It provides your doctor with information about the size and shape of your heart and how well your heart's chambers and valves are working. This procedure takes approximately one hour. There are no restrictions for this procedure.    Follow-Up: At [facility], you and your health needs are our priority.  As part of our continuing mission to provide you with exceptional heart care, we have created designated Provider Care Teams.  These Care Teams include your primary Cardiologist (physician) and Advanced Practice Providers (APPs -  Physician Assistants and Nurse Practitioners) who all work together to provide you with the care you need, when you need it. You will need a follow up appointment in 3 months.  Please call our office 2 months in advance to schedule this appointment.  You may see Rozann Lesches, MD or one of the following Advanced  Practice Providers on your designated Care Team:   Bernerd Pho, PA-C [facility]) . Ermalinda Barrios, PA-C ([place])  Any Other Special Instructions Will Be Listed Below (If Applicable). Thank you for choosing [facility]!    Signed, Erma Heritage, PA-C  05/13/2019 5:17 PM    [facility] [address] [place], [place] [address] Phone: [phone] Fax: [phone]

## 2019-05-13 NOTE — Patient Instructions (Signed)
Medication Instructions:  Your physician recommends that you continue on your current medications as directed. Please refer to the Current Medication list given to you today.  If you need a refill on your cardiac medications before your next appointment, please call your pharmacy.   Lab work: Your physician recommends that you return for lab work in: Today   If you have labs (blood work) drawn today and your tests are completely normal, you will receive your results only by: Marland Kitchen MyChart Message (if you have MyChart) OR . A paper copy in the mail If you have any lab test that is abnormal or we need to change your treatment, we will call you to review the results.  Testing/Procedures: Your physician has requested that you have an echocardiogram. Echocardiography is a painless test that uses sound waves to create images of your heart. It provides your doctor with information about the size and shape of your heart and how well your heart's chambers and valves are working. This procedure takes approximately one hour. There are no restrictions for this procedure.    Follow-Up: At [facility], you and your health needs are our priority.  As part of our continuing mission to provide you with exceptional heart care, we have created designated Provider Care Teams.  These Care Teams include your primary Cardiologist (physician) and Advanced Practice Providers (APPs -  Physician Assistants and Nurse Practitioners) who all work together to provide you with the care you need, when you need it. You will need a follow up appointment in 3 months.  Please call our office 2 months in advance to schedule this appointment.  You may see Rozann Lesches, MD or one of the following Advanced Practice Providers on your designated Care Team:   Bernerd Pho, PA-C [facility]) . Ermalinda Barrios, PA-C ([place])  Any Other Special Instructions Will Be Listed Below (If Applicable). Thank you for  choosing [facility]!

## 2019-05-15 ENCOUNTER — Telehealth: Payer: Self-pay | Admitting: *Deleted

## 2019-05-15 DIAGNOSIS — Z79899 Other long term (current) drug therapy: Secondary | ICD-10-CM

## 2019-05-15 MED ORDER — SPIRONOLACTONE 25 MG PO TABS: 25.0000 mg | ORAL_TABLET | Freq: Every day | ORAL | 3 refills | Status: DC

## 2019-05-15 NOTE — Telephone Encounter (Signed)
-----   Message from Erma Heritage, [place] sent at 05/13/2019  5:11 PM EDT ----- Sorry for the miscommunication. In reviewing his current medication regimen and recent labs, would recommend titrating Spironolactone to 25 mg daily given his cardiomyopathy. Recheck BMET in 3-4 weeks.

## 2019-06-10 LAB — BASIC METABOLIC PANEL
BUN/Creatinine Ratio: 14 (calc) (ref 6–22)
BUN: 19 mg/dL (ref 7–25)
CO2: 33 mmol/L — ABNORMAL HIGH (ref 20–32)
Calcium: 9.5 mg/dL (ref 8.6–10.3)
Chloride: 100 mmol/L (ref 98–110)
Creat: 1.4 mg/dL — ABNORMAL HIGH (ref 0.70–1.18)
Glucose, Bld: 100 mg/dL — ABNORMAL HIGH (ref 65–99)
Potassium: 3.8 mmol/L (ref 3.5–5.3)
Sodium: 140 mmol/L (ref 135–146)

## 2019-06-16 ENCOUNTER — Telehealth: Payer: Self-pay | Admitting: *Deleted

## 2019-06-16 ENCOUNTER — Telehealth: Payer: Self-pay | Admitting: Licensed Clinical Social Worker

## 2019-06-16 DIAGNOSIS — Z79899 Other long term (current) drug therapy: Secondary | ICD-10-CM

## 2019-06-16 MED ORDER — FUROSEMIDE 20 MG PO TABS: 40.0000 mg | ORAL_TABLET | Freq: Two times a day (BID) | ORAL | 11 refills | Status: DC

## 2019-06-16 NOTE — Telephone Encounter (Signed)
CSW attempted to reach patient to ship a scale although unable to leave a message. Patient's address in [place] will not work as an acceptable address for [employer] delivery. CSW sent message back to [facility]. Raquel Sarna, [place], [place]

## 2019-06-16 NOTE — Telephone Encounter (Signed)
-----   Message from Erma Heritage, [place] sent at 06/11/2019 11:02 AM EDT ----- Please let the patient know his electrolytes remain within a normal range. His kidney function has slightly worsened and I suspect this is likely due to the Lasix or recent titration of Spironolactone. If weight has been stable, would recommend reducing Lasix from 60mg  BID to 40mg  BID. He should make [place] aware if weight starts to increase with this adjustment. Repeat BMET again in 3-4 weeks to recheck his kidneys. Please forward a copy of results to Lucia Gaskins, MD.

## 2019-06-16 NOTE — Telephone Encounter (Signed)
Sister caregiver notified

## 2019-06-17 ENCOUNTER — Telehealth: Payer: Self-pay | Admitting: Licensed Clinical Social Worker

## 2019-06-17 NOTE — Telephone Encounter (Signed)
CSW referred to assist patient with obtaining a scale. CSW contacted patient's sister to inform scale will be delivered to home. Patient's sister grateful for support and assistance. CSW available as needed. Raquel Sarna, [place], [place]

## 2019-07-24 ENCOUNTER — Other Ambulatory Visit (HOSPITAL_COMMUNITY)
Admission: RE | Admit: 2019-07-24 | Discharge: 2019-07-24 | Disposition: A | Discharge status: Home / Self Care | Payer: Medicare Other | Source: Ambulatory Visit | Attending: Student | Admitting: Student

## 2019-07-24 ENCOUNTER — Telehealth: Payer: Self-pay | Admitting: *Deleted

## 2019-07-24 DIAGNOSIS — Z79899 Other long term (current) drug therapy: Secondary | ICD-10-CM | POA: Insufficient documentation

## 2019-07-24 LAB — BASIC METABOLIC PANEL
Anion gap: 8 (ref 5–15)
BUN: 37 mg/dL — ABNORMAL HIGH (ref 8–23)
CO2: 28 mmol/L (ref 22–32)
Calcium: 9.4 mg/dL (ref 8.9–10.3)
Chloride: 103 mmol/L (ref 98–111)
Creatinine, Ser: 1.31 mg/dL — ABNORMAL HIGH (ref 0.61–1.24)
GFR calc Af Amer: >60 mL/min (ref >60)
GFR calc non Af Amer: 54 mL/min — ABNORMAL LOW (ref >60)
Glucose, Bld: 108 mg/dL — ABNORMAL HIGH (ref 70–99)
Potassium: 4.1 mmol/L (ref 3.5–5.1)
Sodium: 139 mmol/L (ref 135–145)

## 2019-07-24 NOTE — Telephone Encounter (Signed)
Called patient with test results. No answer. No Voicemail.

## 2019-07-24 NOTE — Telephone Encounter (Signed)
-----   Message from Erma Heritage, [place] sent at 07/24/2019  9:35 AM EDT ----- Please let the patient know that his electrolytes remain stable and renal function has improved as creatinine was previously elevated at 1.40 and is now improving to 1.31. Continue with current medication regimen at this time. Please forward a copy of results to Lucia Gaskins, MD.

## 2019-08-13 ENCOUNTER — Other Ambulatory Visit: Payer: Self-pay

## 2019-08-13 ENCOUNTER — Ambulatory Visit (HOSPITAL_COMMUNITY)
Admission: RE | Admit: 2019-08-13 | Discharge: 2019-08-13 | Disposition: A | Discharge status: Home / Self Care | Payer: Medicare Other | Source: Ambulatory Visit | Attending: Student | Admitting: Student

## 2019-08-13 DIAGNOSIS — I5042 Chronic combined systolic (congestive) and diastolic (congestive) heart failure: Secondary | ICD-10-CM | POA: Diagnosis present

## 2019-08-13 NOTE — Progress Notes (Signed)
*  PRELIMINARY RESULTS* Echocardiogram 2D Echocardiogram has been performed.  Don [name] 08/13/2019, 10:20 AM

## 2019-08-27 ENCOUNTER — Ambulatory Visit: Payer: Medicare Other | Admitting: Cardiology

## 2019-09-30 ENCOUNTER — Encounter: Payer: Self-pay | Admitting: Cardiology

## 2019-09-30 ENCOUNTER — Ambulatory Visit (INDEPENDENT_AMBULATORY_CARE_PROVIDER_SITE_OTHER): Payer: Medicare Other | Admitting: Cardiology

## 2019-09-30 ENCOUNTER — Other Ambulatory Visit: Payer: Self-pay

## 2019-09-30 VITALS — BP 118/64 | HR 70 | Ht 70.0 in | Wt 183.0 lb

## 2019-09-30 DIAGNOSIS — I429 Cardiomyopathy, unspecified: Secondary | ICD-10-CM | POA: Diagnosis not present

## 2019-09-30 DIAGNOSIS — N1832 Chronic kidney disease, stage 3b: Secondary | ICD-10-CM

## 2019-09-30 DIAGNOSIS — I10 Essential (primary) hypertension: Secondary | ICD-10-CM | POA: Diagnosis not present

## 2019-09-30 DIAGNOSIS — E782 Mixed hyperlipidemia: Secondary | ICD-10-CM | POA: Diagnosis not present

## 2019-09-30 NOTE — Progress Notes (Signed)
Cardiology Office Note  Date: 09/30/2019   ID: Don [name], DOB [birth year], MRN [id number]  PCP:  Lucia Gaskins, MD  Cardiologist:  Rozann Lesches, MD Electrophysiologist:  None   Chief Complaint  Patient presents with  . Cardiac follow-up    History of Present Illness: Don [name] is a 74 y.o. male last seen in August by Ms. Strader PA-C.  He is here today with his wife for a follow-up visit.  Overall doing well, reports NYHA class II dyspnea, enjoys walking for exercise.  He does not describe any exertional chest pain or palpitations.  Follow-up echocardiogram in November 2020 showed improvement in LVEF to approximately 45% with mild diastolic dysfunction and mildly reduced RV contraction.  We went over his medications which are outlined below.  He reports compliance, his wife helps get his medications straight for him.  He does have occasional orthostatic lightheadedness, no syncope.  Past Medical History:  Diagnosis Date  . Cardiomyopathy ([place])    a. EF 20 to 25% by echo in 03/2019.  Marland Kitchen Essential hypertension   . Gout   . Hepatitis C   . Hyperlipidemia     Past Surgical History:  Procedure Laterality Date  . HERNIA REPAIR Left     Current Outpatient Medications  Medication Sig Dispense Refill  . albuterol (VENTOLIN HFA) 108 (90 Base) MCG/ACT inhaler Inhale 1-2 puffs into the lungs every 6 (six) hours as needed.     Marland Kitchen allopurinol (ZYLOPRIM) 300 MG tablet Take 300 mg by mouth daily.     Marland Kitchen aspirin EC 81 MG tablet Take 81 mg by mouth daily.    . carvedilol (COREG) 6.25 MG tablet Take 1 tablet (6.25 mg total) by mouth 2 (two) times daily with a meal. 180 tablet 3  . furosemide (LASIX) 20 MG tablet Take 2 tablets (40 mg total) by mouth 2 (two) times daily. 120 tablet 11  . naproxen (NAPROSYN) 500 MG tablet Take 500 mg by mouth 2 (two) times daily with a meal.     . omeprazole (PRILOSEC) 20 MG capsule Take 20 mg by mouth daily.     Marland Kitchen oxycodone (OXY-IR) 5 MG  capsule Take 5 mg by mouth every 4 (four) hours as needed for pain.     . pravastatin (PRAVACHOL) 40 MG tablet Take 40 mg by mouth daily.    Marland Kitchen spironolactone (ALDACTONE) 25 MG tablet Take 1 tablet (25 mg total) by mouth daily. 90 tablet 3   No current facility-administered medications for this visit.   Allergies:  Penicillins   Social History: The patient  reports that he quit smoking about 11 years ago. He has never used smokeless tobacco. He reports current drug use. He reports that he does not drink alcohol.   ROS:  Please see the history of present illness. Otherwise, complete review of systems is positive for none.  All other systems are reviewed and negative.   Physical Exam: VS:  BP 118/64   Pulse 70   Ht 5\' 10"  (1.778 m)   Wt 183 lb (83 kg)   SpO2 97%   BMI 26.26 kg/m , BMI Body mass index is 26.26 kg/m.  Wt Readings from Last 3 Encounters:  09/30/19 183 lb (83 kg)  05/13/19 167 lb (75.8 kg)  04/24/19 164 lb 10.9 oz (74.7 kg)    General: Patient appears comfortable at rest. HEENT: Conjunctiva and lids normal, wearing a mask. Neck: Supple, no elevated JVP or carotid bruits, no thyromegaly. Lungs:  Clear to auscultation, nonlabored breathing at rest. Cardiac: Regular rate and rhythm, no S3 or significant systolic murmur. Abdomen: Soft, nontender, bowel sounds present. Extremities: No pitting edema, distal pulses 2+. Skin: Warm and dry. Musculoskeletal: No kyphosis. Neuropsychiatric: Alert and oriented x3, affect grossly appropriate.  ECG:  An ECG dated 04/18/2019 was personally reviewed today and demonstrated:  Sinus rhythm with low voltage and nonspecific T wave changes.  Recent Labwork: 04/18/2019: B Natriuretic Peptide 1,170.0 04/19/2019: ALT 21; AST 31 04/23/2019: TSH 6.777 04/24/2019: Hemoglobin 12.8; Magnesium 1.8; Platelets 187 07/24/2019: BUN 37; Creatinine, Ser 1.31; Potassium 4.1; Sodium 139     Component Value Date/Time   CHOL 113 04/23/2019 0411   TRIG 81  04/23/2019 0411   HDL 37 (L) 04/23/2019 0411   CHOLHDL 3.1 04/23/2019 0411   VLDL 16 04/23/2019 0411   LDLCALC 60 04/23/2019 0411    Other Studies Reviewed Today:  Echocardiogram 08/13/2019:  1. Left ventricular ejection fraction, by visual estimation, is 45%. The left ventricle has mildly decreased function. There is moderately increased left ventricular hypertrophy.  2. Left ventricular diastolic parameters are consistent with Grade I diastolic dysfunction (impaired relaxation).  3. The left ventricle demonstrates global hypokinesis.  4. Global right ventricle has mildly reduced systolic function.The right ventricular size is mildly enlarged. No increase in right ventricular wall thickness.  5. Left atrial size was mildly dilated.  6. Right atrial size was normal.  7. Mild aortic valve annular calcification.  8. Mild mitral annular calcification.  9. The mitral valve is grossly normal. No evidence of mitral valve regurgitation. 10. The tricuspid valve is grossly normal. Tricuspid valve regurgitation is mild. 11. The aortic valve is tricuspid. Aortic valve regurgitation is not visualized. No evidence of aortic valve sclerosis or stenosis. 12. The pulmonic valve was grossly normal. Pulmonic valve regurgitation is not visualized. 13. Normal pulmonary artery systolic pressure. 14. The inferior vena cava is normal in size with greater than 50% respiratory variability, suggesting right atrial pressure of 3 mmHg.  Assessment and Plan:  1.  Secondary cardiomyopathy, likely nonischemic with improvement in LVEF to approximately 45% on medical therapy.  He is symptomatically stable at this time.  Continue Coreg, Aldactone, and Lasix.  Follow-up BMET.  2.  Essential hypertension, systolic blood pressure normal today.  No changes made.  Reports occasional mild orthostatic dizziness.  Would not further advance heart failure regimen at this time.  3.  Mixed hyperlipidemia, continues on Pravachol,  last LDL 70.  4.  CKD stage III, last creatinine 1.31.  Medication Adjustments/Labs and Tests Ordered: Current medicines are reviewed at length with the patient today.  Concerns regarding medicines are outlined above.   Tests Ordered: Orders Placed This Encounter  Procedures  . Basic Metabolic Panel (BMET)    Medication Changes: No orders of the defined types were placed in this encounter.   Disposition:  Follow up 6 months in the [place] office.  Signed, Jonelle Sidle, MD, [facility] 09/30/2019 1:41 PM    [facility] at [facility] [address], [place], [place] [address] Phone: [phone]; Fax: [phone]

## 2019-09-30 NOTE — Patient Instructions (Addendum)
Medication Instructions:  Your physician recommends that you continue on your current medications as directed. Please refer to the Current Medication list given to you today.   Labwork: Bmet - soon   Testing/Procedures: none  Follow-Up: Your physician wants you to follow-up in: 6 months.  You will receive a reminder letter in the mail two months in advance. If you don't receive a letter, please call our office to schedule the follow-up appointment.   Any Other Special Instructions Will Be Listed Below (If Applicable).     If you need a refill on your cardiac medications before your next appointment, please call your pharmacy.

## 2019-10-08 ENCOUNTER — Other Ambulatory Visit: Payer: Self-pay

## 2019-10-08 ENCOUNTER — Other Ambulatory Visit (HOSPITAL_COMMUNITY)
Admission: RE | Admit: 2019-10-08 | Discharge: 2019-10-08 | Disposition: A | Discharge status: Home / Self Care | Payer: Medicare Other | Source: Ambulatory Visit | Attending: Cardiology | Admitting: Cardiology

## 2019-10-08 DIAGNOSIS — I429 Cardiomyopathy, unspecified: Secondary | ICD-10-CM | POA: Diagnosis present

## 2019-10-08 LAB — BASIC METABOLIC PANEL
Anion gap: 9 (ref 5–15)
BUN: 30 mg/dL — ABNORMAL HIGH (ref 8–23)
CO2: 29 mmol/L (ref 22–32)
Calcium: 9.5 mg/dL (ref 8.9–10.3)
Chloride: 103 mmol/L (ref 98–111)
Creatinine, Ser: 1.17 mg/dL (ref 0.61–1.24)
GFR calc Af Amer: >60 mL/min (ref >60)
GFR calc non Af Amer: >60 mL/min (ref >60)
Glucose, Bld: 113 mg/dL — ABNORMAL HIGH (ref 70–99)
Potassium: 4.2 mmol/L (ref 3.5–5.1)
Sodium: 141 mmol/L (ref 135–145)

## 2020-02-17 ENCOUNTER — Other Ambulatory Visit: Payer: Self-pay | Admitting: Student

## 2020-05-17 ENCOUNTER — Other Ambulatory Visit: Payer: Self-pay | Admitting: Student

## 2020-07-09 ENCOUNTER — Other Ambulatory Visit: Payer: Self-pay | Admitting: Student

## 2020-11-13 ENCOUNTER — Other Ambulatory Visit: Payer: Self-pay | Admitting: Student

## 2020-11-15 NOTE — Telephone Encounter (Signed)
This is a [place] pt.  °

## 2020-12-16 ENCOUNTER — Other Ambulatory Visit: Payer: Self-pay | Admitting: Student

## 2020-12-16 NOTE — Telephone Encounter (Signed)
This is a [place] pt, Dr. McDowell 

## 2020-12-31 ENCOUNTER — Other Ambulatory Visit: Payer: Self-pay | Admitting: Cardiology

## 2021-01-04 ENCOUNTER — Other Ambulatory Visit: Payer: Self-pay | Admitting: Cardiology

## 2021-01-04 ENCOUNTER — Telehealth: Payer: Self-pay

## 2021-01-04 NOTE — Telephone Encounter (Signed)
No answer no vm - tried to reach patient for past due appointment so that he can get refills.

## 2021-01-04 NOTE — Telephone Encounter (Signed)
-----   Message from Kerney Elbe, LPN sent at 8/32/9191  9:33 AM EDT ----- Regarding: overdue for appt Pt is over due for follow up and requesting refills on meds

## 2021-01-11 NOTE — Telephone Encounter (Signed)
No answer no vm - tried to reach patient for appt to get refills

## 2021-01-14 ENCOUNTER — Other Ambulatory Visit: Payer: Self-pay | Admitting: Student

## 2021-01-14 ENCOUNTER — Other Ambulatory Visit: Payer: Self-pay | Admitting: Cardiology

## 2021-01-14 NOTE — Telephone Encounter (Signed)
This is a [place] pt, Dr. McDowell 

## 2021-01-18 ENCOUNTER — Other Ambulatory Visit: Payer: Self-pay | Admitting: Cardiology

## 2021-01-25 ENCOUNTER — Other Ambulatory Visit: Payer: Self-pay

## 2021-01-25 ENCOUNTER — Other Ambulatory Visit: Payer: Self-pay | Admitting: Student

## 2021-01-25 MED ORDER — FUROSEMIDE 20 MG PO TABS: 40.0000 mg | ORAL_TABLET | Freq: Two times a day (BID) | ORAL | 6 refills | Status: DC

## 2021-01-25 NOTE — Telephone Encounter (Signed)
Refilled lasix 40 mg BID to [facility]. He has f/u this month with Dr.McDowell

## 2021-02-03 ENCOUNTER — Encounter: Payer: Self-pay | Admitting: Cardiology

## 2021-02-03 ENCOUNTER — Encounter (INDEPENDENT_AMBULATORY_CARE_PROVIDER_SITE_OTHER): Payer: Self-pay

## 2021-02-03 ENCOUNTER — Other Ambulatory Visit: Payer: Self-pay

## 2021-02-03 ENCOUNTER — Ambulatory Visit (INDEPENDENT_AMBULATORY_CARE_PROVIDER_SITE_OTHER): Payer: Medicare Other | Admitting: Cardiology

## 2021-02-03 VITALS — BP 138/78 | HR 81 | Ht 73.0 in | Wt 181.0 lb

## 2021-02-03 DIAGNOSIS — I428 Other cardiomyopathies: Secondary | ICD-10-CM

## 2021-02-03 DIAGNOSIS — N1832 Chronic kidney disease, stage 3b: Secondary | ICD-10-CM | POA: Diagnosis not present

## 2021-02-03 DIAGNOSIS — Z79899 Other long term (current) drug therapy: Secondary | ICD-10-CM

## 2021-02-03 DIAGNOSIS — I10 Essential (primary) hypertension: Secondary | ICD-10-CM

## 2021-02-03 MED ORDER — SPIRONOLACTONE 25 MG PO TABS: 1.0000 | ORAL_TABLET | Freq: Every day | ORAL | 3 refills | Status: DC

## 2021-02-03 MED ORDER — CARVEDILOL 6.25 MG PO TABS: 6.2500 mg | ORAL_TABLET | Freq: Two times a day (BID) | ORAL | 3 refills | Status: DC

## 2021-02-03 NOTE — Patient Instructions (Signed)
Medication Instructions:  Your physician recommends that you continue on your current medications as directed. Please refer to the Current Medication list given to you today.  *If you need a refill on your cardiac medications before your next appointment, please call your pharmacy*   Lab Work: BMET in 2 weeks  ( 5/26)   If you have labs (blood work) drawn today and your tests are completely normal, you will receive your results only by: Marland Kitchen MyChart Message (if you have MyChart) OR . A paper copy in the mail If you have any lab test that is abnormal or we need to change your treatment, we will call you to review the results.   Testing/Procedures: Your physician has requested that you have an echocardiogram. Echocardiography is a painless test that uses sound waves to create images of your heart. It provides your doctor with information about the size and shape of your heart and how well your heart's chambers and valves are working. This procedure takes approximately one hour. There are no restrictions for this procedure.     Follow-Up: At [facility], you and your health needs are our priority.  As part of our continuing mission to provide you with exceptional heart care, we have created designated Provider Care Teams.  These Care Teams include your primary Cardiologist (physician) and Advanced Practice Providers (APPs -  Physician Assistants and Nurse Practitioners) who all work together to provide you with the care you need, when you need it.  We recommend signing up for the patient portal called "MyChart".  Sign up information is provided on this After Visit Summary.  MyChart is used to connect with patients for Virtual Visits (Telemedicine).  Patients are able to view lab/test results, encounter notes, upcoming appointments, etc.  Non-urgent messages can be sent to your provider as well.   To learn more about what you can do with MyChart, go to [email or web address].    Your next  appointment:   6 month(s)  The format for your next appointment:   In Person  Provider:   Nona Dell, MD   Other Instructions None

## 2021-02-03 NOTE — Progress Notes (Signed)
Cardiology Office Note  Date: 02/03/2021   ID: Don [name], DOB [birth year], MRN [id number]  PCP:  Oval Linsey, MD  Cardiologist:  Nona Dell, MD Electrophysiologist:  None   Chief Complaint  Patient presents with  . Cardiac follow-up    History of Present Illness: Don [name] is a 75 y.o. male last seen in January 2021.  He is here today for a follow-up visit.  Reports NYHA class II dyspnea, no angina symptoms.  No palpitations or syncope.  We went over his medications.  He ran out of Aldactone and Coreg recently, otherwise states that he has been compliant with therapy.  He does not report any leg swelling on current diuretic dose, no weight gain over the last year.  I personally reviewed his ECG today which shows sinus rhythm with PAC, nonspecific T wave changes.  His last echocardiogram was in November 2020 at which point LVEF was 45% with mild RV dysfunction.  We discussed getting an updated study.  Past Medical History:  Diagnosis Date  . Cardiomyopathy ([facility])    a. EF 20 to 25% by echo in 03/2019.  Marland Kitchen Essential hypertension   . Gout   . Hepatitis C   . Hyperlipidemia     Past Surgical History:  Procedure Laterality Date  . HERNIA REPAIR Left     Current Outpatient Medications  Medication Sig Dispense Refill  . albuterol (VENTOLIN HFA) 108 (90 Base) MCG/ACT inhaler Inhale 1-2 puffs into the lungs every 6 (six) hours as needed.     Marland Kitchen allopurinol (ZYLOPRIM) 300 MG tablet Take 300 mg by mouth daily.     Marland Kitchen aspirin EC 81 MG tablet Take 81 mg by mouth daily.    Marland Kitchen ENTRESTO 24-26 MG TAKE ONE TABLET BY MOUTH TWICE A DAY 180 tablet 3  . furosemide (LASIX) 20 MG tablet Take 2 tablets (40 mg total) by mouth 2 (two) times daily. 120 tablet 6  . naproxen (NAPROSYN) 500 MG tablet Take 500 mg by mouth 2 (two) times daily with a meal.     . omeprazole (PRILOSEC) 20 MG capsule Take 20 mg by mouth daily.    Marland Kitchen oxyCODONE (OXY IR/ROXICODONE) 5 MG immediate release  tablet Take 5 mg by mouth 3 (three) times daily.    . pravastatin (PRAVACHOL) 40 MG tablet Take 40 mg by mouth daily.    Marland Kitchen spironolactone (ALDACTONE) 25 MG tablet TAKE 1 TABLET BY MOUTH DAILY. 15 tablet 0  . carvedilol (COREG) 6.25 MG tablet Take 1 tablet (6.25 mg total) by mouth 2 (two) times daily with a meal. 180 tablet 3   No current facility-administered medications for this visit.   Allergies:  Penicillins   ROS: No palpitations or syncope.  Physical Exam: VS:  BP 138/78   Pulse 81   Ht 6\' 1"  (1.854 m)   Wt 181 lb (82.1 kg)   SpO2 98%   BMI 23.88 kg/m , BMI Body mass index is 23.88 kg/m.  Wt Readings from Last 3 Encounters:  02/03/21 181 lb (82.1 kg)  09/30/19 183 lb (83 kg)  05/13/19 167 lb (75.8 kg)    General: Patient appears comfortable at rest. HEENT: Conjunctiva and lids normal, wearing a mask. Neck: Supple, no elevated JVP or carotid bruits, no thyromegaly. Lungs: Clear to auscultation, nonlabored breathing at rest. Cardiac: Regular rate and rhythm, no S3 or significant systolic murmur, no pericardial rub. Extremities: No pitting edema.  ECG:  An ECG dated 04/18/2019 was personally  reviewed today and demonstrated:  Sinus rhythm with low voltage and nonspecific T wave changes.  Recent Labwork:    Component Value Date/Time   CHOL 113 04/23/2019 0411   TRIG 81 04/23/2019 0411   HDL 37 (L) 04/23/2019 0411   CHOLHDL 3.1 04/23/2019 0411   VLDL 16 04/23/2019 0411   LDLCALC 60 04/23/2019 0411  January 2021: Potassium 4.2, BUN 30, creatinine 1.17  Other Studies Reviewed Today:  Echocardiogram 08/13/2019: 1. Left ventricular ejection fraction, by visual estimation, is 45%. The left ventricle has mildly decreased function. There is moderately increased left ventricular hypertrophy. 2. Left ventricular diastolic parameters are consistent with Grade I diastolic dysfunction (impaired relaxation). 3. The left ventricle demonstrates global hypokinesis. 4. Global  right ventricle has mildly reduced systolic function.The right ventricular size is mildly enlarged. No increase in right ventricular wall thickness. 5. Left atrial size was mildly dilated. 6. Right atrial size was normal. 7. Mild aortic valve annular calcification. 8. Mild mitral annular calcification. 9. The mitral valve is grossly normal. No evidence of mitral valve regurgitation. 10. The tricuspid valve is grossly normal. Tricuspid valve regurgitation is mild. 11. The aortic valve is tricuspid. Aortic valve regurgitation is not visualized. No evidence of aortic valve sclerosis or stenosis. 12. The pulmonic valve was grossly normal. Pulmonic valve regurgitation is not visualized. 13. Normal pulmonary artery systolic pressure. 14. The inferior vena cava is normal in size with greater than 50% respiratory variability, suggesting right atrial pressure of 3 mmHg.  Assessment and Plan:  1.  Nonischemic cardiomyopathy, LVEF approximately 45% as of November 2020.  Plan to obtain a follow-up echocardiogram for reevaluation.  Refills provided for Aldactone and Coreg.  Otherwise continue Entresto and Lasix.  Check BMET in the next 7 to 10 days.  2.  Essential hypertension, systolic in the 130s today.  Resuming both Coreg and Aldactone, no changes in present dosing.  3.  CKD stage IIIb by history, creatinine 1.17 last year.  Medication Adjustments/Labs and Tests Ordered: Current medicines are reviewed at length with the patient today.  Concerns regarding medicines are outlined above.   Tests Ordered: Orders Placed This Encounter  Procedures  . Basic metabolic panel  . EKG 12-Lead  . ECHOCARDIOGRAM COMPLETE    Medication Changes: Meds ordered this encounter  Medications  . carvedilol (COREG) 6.25 MG tablet    Sig: Take 1 tablet (6.25 mg total) by mouth 2 (two) times daily with a meal.    Dispense:  180 tablet    Refill:  3    Disposition:  Follow up 6 months.  Signed, Jonelle Sidle, MD, [facility] 02/03/2021 10:34 AM    [facility] at [facility] [address] [place], [place] [address] Phone: [phone]; Fax: [phone]

## 2021-02-14 ENCOUNTER — Ambulatory Visit (HOSPITAL_COMMUNITY)
Admission: RE | Admit: 2021-02-14 | Discharge: 2021-02-14 | Disposition: A | Discharge status: Home / Self Care | Payer: Medicare Other | Source: Ambulatory Visit | Attending: Cardiology | Admitting: Cardiology

## 2021-02-14 ENCOUNTER — Other Ambulatory Visit: Payer: Self-pay

## 2021-02-14 DIAGNOSIS — I428 Other cardiomyopathies: Secondary | ICD-10-CM | POA: Insufficient documentation

## 2021-02-14 LAB — ECHOCARDIOGRAM COMPLETE
Area-P 1/2: 1.62 cm2
Calc EF: 48.2 %
S' Lateral: 3.8 cm
Single Plane A2C EF: 52.4 %
Single Plane A4C EF: 50.1 %

## 2021-02-14 NOTE — Progress Notes (Signed)
*  PRELIMINARY RESULTS* Echocardiogram 2D Echocardiogram has been performed.  Stacey Drain 02/14/2021, 11:13 AM

## 2021-04-22 ENCOUNTER — Ambulatory Visit: Payer: Medicare Other | Admitting: Cardiology

## 2021-07-11 ENCOUNTER — Telehealth: Payer: Self-pay | Admitting: Cardiology

## 2021-07-11 NOTE — Telephone Encounter (Signed)
NP Royann Shivers called requesting to speak with Dr Diona Browner nurse about this patient

## 2021-07-11 NOTE — Telephone Encounter (Signed)
Returned call to Rachel Hyler, NP. No answer/no voicemail 

## 2021-07-13 NOTE — Telephone Encounter (Signed)
Returned call to Royann Shivers, NP. No answer/no voicemail

## 2021-07-15 ENCOUNTER — Other Ambulatory Visit: Payer: Self-pay | Admitting: Cardiology

## 2021-07-15 NOTE — Telephone Encounter (Signed)
Returned call to Rachel Hyler, NP. No answer/no voicemail 

## 2021-08-29 ENCOUNTER — Other Ambulatory Visit (HOSPITAL_COMMUNITY)
Admission: RE | Admit: 2021-08-29 | Discharge: 2021-08-29 | Disposition: A | Discharge status: Home / Self Care | Payer: Medicare Other | Source: Ambulatory Visit | Attending: Internal Medicine | Admitting: Internal Medicine

## 2021-08-29 DIAGNOSIS — I10 Essential (primary) hypertension: Secondary | ICD-10-CM | POA: Insufficient documentation

## 2021-08-29 DIAGNOSIS — Z125 Encounter for screening for malignant neoplasm of prostate: Secondary | ICD-10-CM | POA: Diagnosis present

## 2021-08-29 DIAGNOSIS — E559 Vitamin D deficiency, unspecified: Secondary | ICD-10-CM | POA: Diagnosis not present

## 2021-08-29 DIAGNOSIS — I429 Cardiomyopathy, unspecified: Secondary | ICD-10-CM | POA: Diagnosis not present

## 2021-08-29 DIAGNOSIS — Z131 Encounter for screening for diabetes mellitus: Secondary | ICD-10-CM | POA: Insufficient documentation

## 2021-08-29 DIAGNOSIS — E785 Hyperlipidemia, unspecified: Secondary | ICD-10-CM | POA: Insufficient documentation

## 2021-08-29 LAB — CBC WITH DIFFERENTIAL/PLATELET
Abs Immature Granulocytes: 0.01 10*3/uL (ref 0.00–0.07)
Basophils Absolute: 0 10*3/uL (ref 0.0–0.1)
Basophils Relative: 1 %
Eosinophils Absolute: 0.5 10*3/uL (ref 0.0–0.5)
Eosinophils Relative: 9 %
HCT: 39 % (ref 39.0–52.0)
Hemoglobin: 13.5 g/dL (ref 13.0–17.0)
Immature Granulocytes: 0 %
Lymphocytes Relative: 30 %
Lymphs Abs: 1.6 10*3/uL (ref 0.7–4.0)
MCH: 33.8 pg (ref 26.0–34.0)
MCHC: 34.6 g/dL (ref 30.0–36.0)
MCV: 97.7 fL (ref 80.0–100.0)
Monocytes Absolute: 0.4 10*3/uL (ref 0.1–1.0)
Monocytes Relative: 7 %
Neutro Abs: 3 10*3/uL (ref 1.7–7.7)
Neutrophils Relative %: 53 %
Platelets: 204 10*3/uL (ref 150–400)
RBC: 3.99 MIL/uL — ABNORMAL LOW (ref 4.22–5.81)
RDW: 12.5 % (ref 11.5–15.5)
WBC: 5.5 10*3/uL (ref 4.0–10.5)
nRBC: 0 % (ref 0.0–0.2)

## 2021-08-29 LAB — COMPREHENSIVE METABOLIC PANEL
ALT: 22 U/L (ref 0–44)
AST: 24 U/L (ref 15–41)
Albumin: 3.9 g/dL (ref 3.5–5.0)
Alkaline Phosphatase: 59 U/L (ref 38–126)
Anion gap: 5 (ref 5–15)
BUN: 16 mg/dL (ref 8–23)
CO2: 28 mmol/L (ref 22–32)
Calcium: 9 mg/dL (ref 8.9–10.3)
Chloride: 105 mmol/L (ref 98–111)
Creatinine, Ser: 1.09 mg/dL (ref 0.61–1.24)
GFR, Estimated: >60 mL/min (ref >60)
Glucose, Bld: 105 mg/dL — ABNORMAL HIGH (ref 70–99)
Potassium: 3.9 mmol/L (ref 3.5–5.1)
Sodium: 138 mmol/L (ref 135–145)
Total Bilirubin: 1.2 mg/dL (ref 0.3–1.2)
Total Protein: 7.1 g/dL (ref 6.5–8.1)

## 2021-08-29 LAB — LIPID PANEL
Cholesterol: 169 mg/dL (ref 0–200)
HDL: 47 mg/dL (ref >40)
LDL Cholesterol: 104 mg/dL — ABNORMAL HIGH (ref 0–99)
Total CHOL/HDL Ratio: 3.6 RATIO
Triglycerides: 91 mg/dL (ref <150)
VLDL: 18 mg/dL (ref 0–40)

## 2021-08-29 LAB — VITAMIN D 25 HYDROXY (VIT D DEFICIENCY, FRACTURES): Vit D, 25-Hydroxy: 44.48 ng/mL (ref 30–100)

## 2021-08-29 LAB — HEMOGLOBIN A1C
Hgb A1c MFr Bld: 5.8 % — ABNORMAL HIGH (ref 4.8–5.6)
Mean Plasma Glucose: 119.76 mg/dL

## 2021-08-29 LAB — PSA: Prostatic Specific Antigen: 0.46 ng/mL (ref 0.00–4.00)

## 2021-08-29 LAB — TSH: TSH: 2.151 u[IU]/mL (ref 0.350–4.500)

## 2021-09-13 ENCOUNTER — Other Ambulatory Visit: Payer: Self-pay | Admitting: Cardiology

## 2021-10-04 ENCOUNTER — Other Ambulatory Visit (HOSPITAL_COMMUNITY)
Admission: RE | Admit: 2021-10-04 | Discharge: 2021-10-04 | Disposition: A | Discharge status: Home / Self Care | Payer: Medicare Other | Source: Ambulatory Visit | Attending: Nurse Practitioner | Admitting: Nurse Practitioner

## 2021-10-04 ENCOUNTER — Other Ambulatory Visit: Payer: Self-pay

## 2021-10-04 DIAGNOSIS — Z1329 Encounter for screening for other suspected endocrine disorder: Secondary | ICD-10-CM | POA: Insufficient documentation

## 2021-10-04 DIAGNOSIS — E786 Lipoprotein deficiency: Secondary | ICD-10-CM | POA: Diagnosis present

## 2021-10-04 LAB — LIPID PANEL
Cholesterol: 156 mg/dL (ref 0–200)
HDL: 40 mg/dL — ABNORMAL LOW (ref >40)
LDL Cholesterol: 97 mg/dL (ref 0–99)
Total CHOL/HDL Ratio: 3.9 RATIO
Triglycerides: 95 mg/dL (ref <150)
VLDL: 19 mg/dL (ref 0–40)

## 2021-10-04 LAB — TSH: TSH: 3.526 u[IU]/mL (ref 0.350–4.500)

## 2021-11-28 ENCOUNTER — Other Ambulatory Visit: Payer: Self-pay | Admitting: Cardiology

## 2022-01-24 ENCOUNTER — Other Ambulatory Visit: Payer: Self-pay | Admitting: Cardiology

## 2022-02-19 ENCOUNTER — Emergency Department (HOSPITAL_COMMUNITY)
Admission: EM | Admit: 2022-02-19 | Discharge: 2022-02-19 | Disposition: A | Discharge status: Home / Self Care | Payer: Medicare Other | Attending: Emergency Medicine | Admitting: Emergency Medicine

## 2022-02-19 ENCOUNTER — Emergency Department (HOSPITAL_COMMUNITY): Payer: Medicare Other

## 2022-02-19 ENCOUNTER — Other Ambulatory Visit: Payer: Self-pay

## 2022-02-19 ENCOUNTER — Encounter (HOSPITAL_COMMUNITY): Payer: Self-pay

## 2022-02-19 DIAGNOSIS — I509 Heart failure, unspecified: Secondary | ICD-10-CM | POA: Insufficient documentation

## 2022-02-19 DIAGNOSIS — R5383 Other fatigue: Secondary | ICD-10-CM | POA: Diagnosis not present

## 2022-02-19 DIAGNOSIS — I110 Hypertensive heart disease with heart failure: Secondary | ICD-10-CM | POA: Insufficient documentation

## 2022-02-19 DIAGNOSIS — Z79899 Other long term (current) drug therapy: Secondary | ICD-10-CM | POA: Insufficient documentation

## 2022-02-19 DIAGNOSIS — R519 Headache, unspecified: Secondary | ICD-10-CM | POA: Diagnosis not present

## 2022-02-19 DIAGNOSIS — Z7982 Long term (current) use of aspirin: Secondary | ICD-10-CM | POA: Diagnosis not present

## 2022-02-19 DIAGNOSIS — M25512 Pain in left shoulder: Secondary | ICD-10-CM | POA: Insufficient documentation

## 2022-02-19 DIAGNOSIS — M542 Cervicalgia: Secondary | ICD-10-CM | POA: Insufficient documentation

## 2022-02-19 DIAGNOSIS — R079 Chest pain, unspecified: Secondary | ICD-10-CM | POA: Insufficient documentation

## 2022-02-19 LAB — COMPREHENSIVE METABOLIC PANEL
ALT: 16 U/L (ref 0–44)
AST: 21 U/L (ref 15–41)
Albumin: 3.8 g/dL (ref 3.5–5.0)
Alkaline Phosphatase: 65 U/L (ref 38–126)
Anion gap: 6 (ref 5–15)
BUN: 19 mg/dL (ref 8–23)
CO2: 29 mmol/L (ref 22–32)
Calcium: 9.2 mg/dL (ref 8.9–10.3)
Chloride: 105 mmol/L (ref 98–111)
Creatinine, Ser: 1.29 mg/dL — ABNORMAL HIGH (ref 0.61–1.24)
GFR, Estimated: 58 mL/min — ABNORMAL LOW (ref >60)
Glucose, Bld: 122 mg/dL — ABNORMAL HIGH (ref 70–99)
Potassium: 4 mmol/L (ref 3.5–5.1)
Sodium: 140 mmol/L (ref 135–145)
Total Bilirubin: 1.1 mg/dL (ref 0.3–1.2)
Total Protein: 7.6 g/dL (ref 6.5–8.1)

## 2022-02-19 LAB — CBC WITH DIFFERENTIAL/PLATELET
Abs Immature Granulocytes: 0.03 10*3/uL (ref 0.00–0.07)
Basophils Absolute: 0 10*3/uL (ref 0.0–0.1)
Basophils Relative: 0 %
Eosinophils Absolute: 0.2 10*3/uL (ref 0.0–0.5)
Eosinophils Relative: 2 %
HCT: 39.4 % (ref 39.0–52.0)
Hemoglobin: 13.6 g/dL (ref 13.0–17.0)
Immature Granulocytes: 0 %
Lymphocytes Relative: 11 %
Lymphs Abs: 1.1 10*3/uL (ref 0.7–4.0)
MCH: 32.5 pg (ref 26.0–34.0)
MCHC: 34.5 g/dL (ref 30.0–36.0)
MCV: 94.3 fL (ref 80.0–100.0)
Monocytes Absolute: 0.6 10*3/uL (ref 0.1–1.0)
Monocytes Relative: 6 %
Neutro Abs: 7.5 10*3/uL (ref 1.7–7.7)
Neutrophils Relative %: 81 %
Platelets: 226 10*3/uL (ref 150–400)
RBC: 4.18 MIL/uL — ABNORMAL LOW (ref 4.22–5.81)
RDW: 12.1 % (ref 11.5–15.5)
WBC: 9.5 10*3/uL (ref 4.0–10.5)
nRBC: 0 % (ref 0.0–0.2)

## 2022-02-19 LAB — MAGNESIUM: Magnesium: 1.9 mg/dL (ref 1.7–2.4)

## 2022-02-19 LAB — TROPONIN I (HIGH SENSITIVITY)
Troponin I (High Sensitivity): 12 ng/L (ref <18)
Troponin I (High Sensitivity): 13 ng/L (ref <18)

## 2022-02-19 LAB — PROTIME-INR
INR: 1.1 (ref 0.8–1.2)
Prothrombin Time: 14.2 seconds (ref 11.4–15.2)

## 2022-02-19 MED ORDER — METHOCARBAMOL 500 MG PO TABS: 500.0000 mg | ORAL_TABLET | Freq: Two times a day (BID) | ORAL | 0 refills | Status: DC | PRN

## 2022-02-19 MED ORDER — ASPIRIN 81 MG PO CHEW
324.0000 mg | CHEWABLE_TABLET | Freq: Once | ORAL | Status: AC
  Administered 2022-02-19: 324 mg via ORAL
  Filled 2022-02-19: qty 4

## 2022-02-19 MED ORDER — LACTATED RINGERS IV BOLUS
500.0000 mL | Freq: Once | INTRAVENOUS | Status: AC
Start: 2022-02-19 — End: 2022-02-19
  Administered 2022-02-19: 500 mL via INTRAVENOUS

## 2022-02-19 MED ORDER — FENTANYL CITRATE PF 50 MCG/ML IJ SOSY
50.0000 ug | PREFILLED_SYRINGE | Freq: Once | INTRAMUSCULAR | Status: AC
  Administered 2022-02-19: 50 ug via INTRAVENOUS
  Filled 2022-02-19: qty 1

## 2022-02-19 MED ORDER — IOHEXOL 350 MG/ML SOLN
100.0000 mL | Freq: Once | INTRAVENOUS | Status: AC | PRN
  Administered 2022-02-19: 75 mL via INTRAVENOUS

## 2022-02-19 MED ORDER — KETOROLAC TROMETHAMINE 15 MG/ML IJ SOLN
15.0000 mg | Freq: Once | INTRAMUSCULAR | Status: AC
  Administered 2022-02-19: 15 mg via INTRAVENOUS
  Filled 2022-02-19: qty 1

## 2022-02-19 MED ORDER — METHOCARBAMOL 500 MG PO TABS
500.0000 mg | ORAL_TABLET | Freq: Once | ORAL | Status: AC
  Administered 2022-02-19: 500 mg via ORAL
  Filled 2022-02-19: qty 1

## 2022-02-19 NOTE — Discharge Instructions (Signed)
Take ibuprofen and Tylenol as needed for continued pain.  Additionally, there was a prescription for a muscle relaxer that was sent to your pharmacy.  Take this with caution as it can cause lightheadedness and drowsiness.  Return to the emergency department at any time for any new or worsening symptoms of concern

## 2022-02-19 NOTE — ED Notes (Signed)
Pt in CT.

## 2022-02-19 NOTE — ED Triage Notes (Signed)
Patient reports left neck and left shoulder pain that started last night. States that he has overall feeling of not feeling well. Denies chest pain.

## 2022-02-19 NOTE — ED Provider Notes (Signed)
[facility] EMERGENCY DEPARTMENT Provider Note   CSN: [id number] Arrival date & time: 02/19/22  6712     History  Chief Complaint  Patient presents with   Neck Pain    Don [name] is a 76 y.o. male.   Neck Pain Patient presenting for left neck and left shoulder pain.  Onset was last night.  Symptoms have persisted throughout the night and this morning.  He has not had pain in the area of his chest.  He has not had shortness of breath.  He has felt fatigued.  His medical history includes gout, HTN, HLD, GERD, CHF, cardiomyopathy.  Currently, patient lives with his sister.    Home Medications Prior to Admission medications   Medication Sig Start Date End Date Taking? Authorizing Provider  methocarbamol (ROBAXIN) 500 MG tablet Take 1 tablet (500 mg total) by mouth every 12 (twelve) hours as needed for muscle spasms. 02/19/22  Yes Gloris Manchester, MD  albuterol (VENTOLIN HFA) 108 (90 Base) MCG/ACT inhaler Inhale 1-2 puffs into the lungs every 6 (six) hours as needed.  04/08/19   [provider]  allopurinol (ZYLOPRIM) 300 MG tablet Take 300 mg by mouth daily.  03/26/19   [provider]  aspirin EC 81 MG tablet Take 81 mg by mouth daily.    [provider]  carvedilol (COREG) 6.25 MG tablet Take 1 tablet (6.25 mg total) by mouth 2 (two) times daily with a meal. 02/03/21 04/04/21  Jonelle Sidle, MD  ENTRESTO 24-26 MG TAKE (1) TABLET BY MOUTH TWICE DAILY. 09/13/21   Jonelle Sidle, MD  furosemide (LASIX) 20 MG tablet TAKE 2 TABLETS BY MOUTH TWICE A DAY. 11/28/21   Jonelle Sidle, MD  naproxen (NAPROSYN) 500 MG tablet Take 500 mg by mouth 2 (two) times daily with a meal.  03/26/19   [provider]  omeprazole (PRILOSEC) 20 MG capsule Take 20 mg by mouth daily.    [provider]  oxyCODONE (OXY IR/ROXICODONE) 5 MG immediate release tablet Take 5 mg by mouth 3 (three) times daily. 01/12/21   [provider]  pravastatin (PRAVACHOL)  40 MG tablet Take 40 mg by mouth daily.    [provider]  spironolactone (ALDACTONE) 25 MG tablet TAKE 1 TABLET BY MOUTH DAILY. 01/24/22   Jonelle Sidle, MD      Allergies    Penicillins    Review of Systems   Review of Systems  Constitutional:  Positive for fatigue.  Musculoskeletal:  Positive for arthralgias and neck pain.  All other systems reviewed and are negative.  Physical Exam Updated Vital Signs BP (!) 166/80   Pulse 60   Temp 98.1 F (36.7 C) (Oral)   Resp 16   Ht 6\' 1"  (1.854 m)   Wt 79.4 kg   SpO2 99%   BMI 23.09 kg/m  Physical Exam Vitals and nursing note reviewed.  Constitutional:      General: He is not in acute distress.    Appearance: Normal appearance. He is well-developed and normal weight. He is not ill-appearing, toxic-appearing or diaphoretic.  HENT:     Head: Normocephalic and atraumatic.     Right Ear: External ear normal.     Left Ear: External ear normal.     Nose: Nose normal.  Eyes:     Extraocular Movements: Extraocular movements intact.     Conjunctiva/sclera: Conjunctivae normal.  Cardiovascular:     Rate and Rhythm: Normal rate and regular rhythm.  Heart sounds: No murmur heard. Pulmonary:     Effort: Pulmonary effort is normal. No respiratory distress.     Breath sounds: Normal breath sounds. No wheezing or rales.  Chest:     Chest wall: No tenderness.  Abdominal:     Palpations: Abdomen is soft.     Tenderness: There is no abdominal tenderness.  Musculoskeletal:        General: No swelling. Normal range of motion.     Cervical back: Tenderness (Bilateral musculature, greater on the left) present.     Right lower leg: No edema.     Left lower leg: No edema.  Skin:    General: Skin is warm and dry.     Capillary Refill: Capillary refill takes less than 2 seconds.     Coloration: Skin is not jaundiced or pale.  Neurological:     General: No focal deficit present.     Mental Status: He is alert and oriented to  person, place, and time.     Cranial Nerves: No cranial nerve deficit.     Sensory: No sensory deficit.     Motor: No weakness.     Coordination: Coordination normal.  Psychiatric:        Mood and Affect: Mood normal.        Behavior: Behavior normal.    ED Results / Procedures / Treatments   Labs (all labs ordered are listed, but only abnormal results are displayed) Labs Reviewed  COMPREHENSIVE METABOLIC PANEL - Abnormal; Notable for the following components:      Result Value   Glucose, Bld 122 (*)    Creatinine, Ser 1.29 (*)    GFR, Estimated 58 (*)    All other components within normal limits  CBC WITH DIFFERENTIAL/PLATELET - Abnormal; Notable for the following components:   RBC 4.18 (*)    All other components within normal limits  MAGNESIUM  PROTIME-INR  TROPONIN I (HIGH SENSITIVITY)  TROPONIN I (HIGH SENSITIVITY)    EKG EKG Interpretation  Date/Time:  Sunday Feb 19 2022 07:34:15 EDT Ventricular Rate:  57 PR Interval:  184 QRS Duration: 145 QT Interval:  422 QTC Calculation: 411 R Axis:   57 Text Interpretation: Sinus rhythm Supraventricular bigeminy Nonspecific intraventricular conduction delay Confirmed by Gloris Manchester ([id number]) on 02/19/2022 7:51:21 AM  Radiology CT ANGIO HEAD NECK W WO CM  Result Date: 02/19/2022 CLINICAL DATA:  Left-sided neck pain. Acute neuro deficit with stroke suspected EXAM: CT ANGIOGRAPHY HEAD AND NECK TECHNIQUE: Multidetector CT imaging of the head and neck was performed using the standard protocol during bolus administration of intravenous contrast. Multiplanar CT image reconstructions and MIPs were obtained to evaluate the vascular anatomy. Carotid stenosis measurements (when applicable) are obtained utilizing NASCET criteria, using the distal internal carotid diameter as the denominator. RADIATION DOSE REDUCTION: This exam was performed according to the departmental dose-optimization program which includes automated exposure control,  adjustment of the mA and/or kV according to patient size and/or use of iterative reconstruction technique. CONTRAST:  75mL OMNIPAQUE IOHEXOL 350 MG/ML SOLN COMPARISON:  Brain MRI 02/01/2012 FINDINGS: CT HEAD FINDINGS Brain: No evidence of acute infarction, hemorrhage, hydrocephalus, extra-axial collection or mass lesion/mass effect. Cerebral volume loss in keeping with age Vascular: See below Skull: Normal. Negative for fracture or focal lesion. Sinuses: Imaged portions are clear. Orbits: No acute finding. Review of the MIP images confirms the above findings CTA NECK FINDINGS Aortic arch: 3 vessel branching Right carotid system: Diffuse atheromatous wall thickening of the common  carotid and proximal ICA. No flow reducing stenosis or ulceration. Left carotid system: Diffuse atheromatous plaque affecting the common carotid and proximal ICA. No flow reducing stenosis, ulceration, or beading. Vertebral arteries: Proximal subclavian atherosclerosis without flow limiting stenosis. At least 80% stenosis at the right vertebral origin. Mild narrowing at the left vertebral origin. Skeleton: Ordinary degeneration.  No acute or aggressive finding. Other neck: No evidence of mass or adenopathy. Upper chest: Negative Review of the MIP images confirms the above findings CTA HEAD FINDINGS Anterior circulation: Extensive atheromatous plaque along the carotid siphons. The right ICA is smaller than the left in the setting of aplastic right A1 segment. Paraclinoid right ICA stenosis estimated at 50%, quantification limited by calcified plaque blooming. No branch occlusion, beading, or aneurysm. Posterior circulation: Moderate right V4 segment stenosis, proximally 50%. Atheromatous irregularity of bilateral posterior cerebral arteries. No major branch occlusion, beading, or aneurysm. Venous sinuses: Diffusely patent Anatomic variants: As above Review of the MIP images confirms the above findings IMPRESSION: 1. No emergent finding. 2.  80% atheromatous stenosis at the right vertebral origin. 3. ~ 50% narrowing of the paraclinoid right ICA and right V4 segment. Electronically Signed   By: Tiburcio Pea M.D.   On: 02/19/2022 10:00   CT Chest Wo Contrast  Result Date: 02/19/2022 CLINICAL DATA:  Respiratory illness with nondiagnostic x-ray EXAM: CT CHEST WITHOUT CONTRAST TECHNIQUE: Multidetector CT imaging of the chest was performed following the standard protocol without IV contrast. RADIATION DOSE REDUCTION: This exam was performed according to the departmental dose-optimization program which includes automated exposure control, adjustment of the mA and/or kV according to patient size and/or use of iterative reconstruction technique. COMPARISON:  None Available. FINDINGS: Cardiovascular: Cardiomegaly. No pericardial effusion. Gas in the right atrium and pulmonary arteries main pulmonary artery is usually from IV access. No acute aortic finding. Atheromatous calcification of the aorta and coronaries which is extensive. Mediastinum/Nodes: Negative for mass or adenopathy Lungs/Pleura: Bands of opacity in the lower lungs. No air bronchogram , edema, effusion, or pneumothorax Upper Abdomen: Large caudate lobe and fissures with possible surface lobulation. Musculoskeletal: No acute finding IMPRESSION: 1. Mild bilateral atelectasis. 2. Cardiomegaly and extensive atherosclerosis. 3. Probable cirrhosis. Electronically Signed   By: Tiburcio Pea M.D.   On: 02/19/2022 10:18   CT C-SPINE NO CHARGE  Result Date: 02/19/2022 CLINICAL DATA:  Left-sided neck pain, chronic. EXAM: CT CERVICAL SPINE WITHOUT CONTRAST TECHNIQUE: Multidetector CT imaging of the cervical spine was performed without additional intravenous contrast. Intravenous contrast was utilized for the current CT angiogram of the head and neck, and is present on the cervical spine images. Multiplanar CT image reconstructions were also generated. RADIATION DOSE REDUCTION: This exam was  performed according to the departmental dose-optimization program which includes automated exposure control, adjustment of the mA and/or kV according to patient size and/or use of iterative reconstruction technique. COMPARISON:  None Available. FINDINGS: Alignment: Straightened cervical lordosis.  No spondylolisthesis. Skull base and vertebrae: No acute fracture. No primary bone lesion or focal pathologic process. Soft tissues and spinal canal: No prevertebral fluid or swelling. No visible canal hematoma. Disc levels: Moderate loss of disc height at C4-C5 with moderate to marked loss of disc height at C5-C6 and C6-C7. Spondylotic disc bulging with endplate spurring is noted at C5-C6 and C6-C7. There are facet degenerative changes at are greatest on the right at C6-C7. No convincing disc herniation. Upper chest: No acute findings. Other: None. IMPRESSION: 1. No fracture or acute finding.  No spondylolisthesis. 2.  Mid to lower cervical spine disc and facet degenerative changes. Electronically Signed   By: Amie Portland M.D.   On: 02/19/2022 10:13   DG Chest Portable 1 View  Result Date: 02/19/2022 CLINICAL DATA:  Left neck and shoulder pain beginning last night. EXAM: PORTABLE CHEST 1 VIEW COMPARISON:  One-view chest x-ray 04/23/2019 FINDINGS: Heart is enlarged. Small left pleural effusion and associated airspace disease is present. Lungs are otherwise clear. Lung volumes are. IMPRESSION: Small left pleural effusion and associated airspace disease. While this likely reflects atelectasis, infection is not excluded. Electronically Signed   By: Marin Roberts M.D.   On: 02/19/2022 08:07    Procedures Procedures    Medications Ordered in ED Medications  aspirin chewable tablet 324 mg (324 mg Oral Given 02/19/22 0814)  fentaNYL (SUBLIMAZE) injection 50 mcg (50 mcg Intravenous Given 02/19/22 0815)  methocarbamol (ROBAXIN) tablet 500 mg (500 mg Oral Given 02/19/22 0813)  lactated ringers bolus 500 mL (0 mLs  Intravenous Stopped 02/19/22 1052)  ketorolac (TORADOL) 15 MG/ML injection 15 mg (15 mg Intravenous Given 02/19/22 0941)  iohexol (OMNIPAQUE) 350 MG/ML injection 100 mL (75 mLs Intravenous Contrast Given 02/19/22 0935)    ED Course/ Medical Decision Making/ A&P                           Medical Decision Making Amount and/or Complexity of Data Reviewed Labs: ordered. Radiology: ordered.  Risk OTC drugs. Prescription drug management.   This patient presents to the ED for concern of neck pain, this involves an extensive number of treatment options, and is a complaint that carries with it a high risk of complications and morbidity.  The differential diagnosis includes muscle tightness/inflammation, cervical stenosis, occult injury, neoplasm   Co morbidities that complicate the patient evaluation  gout, HTN, HLD, GERD, CHF, cardiomyopathy   Additional history obtained:  Additional history obtained from patient's niece External records from outside source obtained and reviewed including EMR   Lab Tests:  I Ordered, and personally interpreted labs.  The pertinent results include: Normal globin, no leukocytosis, baseline CKD, normal electrolytes, normal troponin   Imaging Studies ordered:  I ordered imaging studies including chest x-ray, CT of chest and cervical spine, CTA of head and neck I independently visualized and interpreted imaging which showed no acute findings I agree with the radiologist interpretation   Cardiac Monitoring: / EKG:  The patient was maintained on a cardiac monitor.  I personally viewed and interpreted the cardiac monitored which showed an underlying rhythm of: Sinus rhythm  Problem List / ED Course / Critical interventions / Medication management  Patient is a 76 year old male presenting from home for neck pain.  He woke up feeling pain and stiffness in his neck.  He also endorses recent fatigue.  Patient is a poor historian.  On arrival, vital signs  are notable for hypertension.  He has no focal neurologic deficits.  He does have some tenderness throughout the musculature of his neck without midline tenderness.  Lungs are clear to auscultation.  Patient was treated with multimodal pain control.  Robaxin and fentanyl were given.  ASA was given for concern of ACS.  Given his age, nonspecific complaints, and poor history, patient did undergo an extensive work-up.  EKG and troponins were ordered for possible referred pain from ACS.  Laboratory results are reassuring.  He underwent imaging of chest, C-spine, and CTA of head and neck.  On his imaging studies, there were  no acute findings.  Patient was given IV fluids and Toradol for continued analgesia.  While in the ED, his niece arrived and joined him at bedside.  Patient's niece confirmed that he is in his current normal state of health.  She was also informed of reassuring work-up results.  Patient and these do feel comfortable with discharge home at this time.  Patient to follow-up with his primary care doctor and to return to the ED if he does experience any worsening of symptoms.  He was discharged in good condition. I ordered medication including Robaxin, fentanyl, Toradol for analgesia, ASA for possible ACS, IVF for hydration and nephro protection given use of contrast in the ED Reevaluation of the patient after these medicines showed that the patient improved I have reviewed the patients home medicines and have made adjustments as needed   Social Determinants of Health:  Has access to outpatient care         Final Clinical Impression(s) / ED Diagnoses Final diagnoses:  Neck pain    Rx / DC Orders ED Discharge Orders          Ordered    methocarbamol (ROBAXIN) 500 MG tablet  Every 12 hours PRN        02/19/22 1513              Gloris Manchester, MD 02/20/22 [phone]

## 2022-02-24 ENCOUNTER — Other Ambulatory Visit: Payer: Self-pay | Admitting: Cardiology

## 2022-03-13 ENCOUNTER — Other Ambulatory Visit: Payer: Self-pay | Admitting: Cardiology

## 2022-03-16 ENCOUNTER — Other Ambulatory Visit: Payer: Self-pay | Admitting: Cardiology

## 2022-03-16 ENCOUNTER — Telehealth: Payer: Self-pay | Admitting: Cardiology

## 2022-03-16 NOTE — Telephone Encounter (Signed)
*  STAT* If patient is at the pharmacy, call can be transferred to refill team.   1. Which medications need to be refilled? (please list name of each medication and dose if known) spironolactone (ALDACTONE) 25 MG tablet  2. Which pharmacy/location (including street and city if local pharmacy) is medication to be sent to? [facility] - [place], [place] - [address]  3. Do they need a 30 day or 90 day supply? 90

## 2022-03-16 NOTE — Telephone Encounter (Signed)
Refill complete 

## 2022-03-20 ENCOUNTER — Telehealth: Payer: Self-pay | Admitting: Cardiology

## 2022-03-20 ENCOUNTER — Other Ambulatory Visit: Payer: Self-pay

## 2022-03-20 MED ORDER — SPIRONOLACTONE 25 MG PO TABS: 25.0000 mg | ORAL_TABLET | Freq: Every day | ORAL | 0 refills | Status: DC

## 2022-03-20 NOTE — Telephone Encounter (Signed)
Refilled aldactone 25 mg qd ,#90, apt made for pt

## 2022-03-29 ENCOUNTER — Other Ambulatory Visit: Payer: Self-pay | Admitting: Cardiology

## 2022-05-30 ENCOUNTER — Encounter: Payer: Self-pay | Admitting: Physician Assistant

## 2022-05-30 NOTE — Progress Notes (Deleted)
Cardiology Office Note    Date:  05/30/2022   ID:  Don [name], DOB October 07, 1945, MRN [id number]  PCP:  Oval Linsey, MD (Inactive)  Cardiologist:  Nona Dell, MD  Electrophysiologist:  None   Chief Complaint: ***  History of Present Illness:   Don [name] is a 76 y.o. 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  He was originally admitted to [facility] in 2020 for dyspnea/LEE and found to have EF 20-25%. Given his multiple medical issues, medical therapy was recommended at that time with reassessment of his EF in 3 months with anticipation of ischemic evaluation if EF had not improved. Last echo 01/2021 showed EF 50% with basal inferior hypokinesis, G1DD, frequent PVCs through study, normal RV, moderate LAE. Dr. Diona Browner has suspected his cardiomyopathy is nonischemic and has not recommended further ischemic testing.   H/o orthostatic lightheaedeenss  Chronic HFrEF/suspected NICM Essential HTN Hyperlipidemia, aortic atherosclerosis, cerebrovascular disease CKD stage 3a  Labwork independently reviewed: 01/2022 trop neg, Hgb 13.6, Plt 226, Mg 1.9, K 4.0, Cr 1.29 09/2021 TSH wnl, LDL 97, trig 95 - lipids managed by PCP  Cardiology Studies:   Studies reviewed are outlined and summarized above. Reports included below if pertinent.   Echo 01/2021   1. Frequent PVCs throughout study.   2. Basal inferior hypokinesis. . Left ventricular ejection fraction, by  estimation, is 50%. The left ventricle has low normal function. There is  mild left ventricular hypertrophy. Left ventricular diastolic parameters  are consistent with Grade I  diastolic dysfunction (impaired relaxation).   3. Right ventricular systolic function is normal. The right  ventricular  size is normal. There is normal pulmonary artery systolic pressure.   4. Left atrial size was moderately dilated.   5. The mitral valve is normal in structure. Trivial mitral valve  regurgitation.   6. The aortic valve is tricuspid. Aortic valve regurgitation is trivial.   Comparison(s): The left ventricular function is unchanged.     Past Medical History:  Diagnosis Date   Cardiomyopathy (HCC)    a. EF 20 to 25% by echo in 03/2019.   Essential hypertension    Gout    Hepatitis C    Hyperlipidemia     Past Surgical History:  Procedure Laterality Date   HERNIA REPAIR Left     Current Medications: No outpatient medications have been marked as taking for the 06/01/22 encounter (Appointment) with Laurann Montana, PA-C.   ***   Allergies:   Penicillins   Social History   Socioeconomic History   Marital status: Single    Spouse name: Not on file   Number of children: Not on file   Years of education: Not on file   Highest education level: Not on file  Occupational History   Not on file  Tobacco Use   Smoking status: Former    Types: Cigarettes    Quit date: 09/29/2008    Years since quitting: 13.6   Smokeless tobacco: Never   Tobacco comments:    years ago  Vaping Use   Vaping Use: Never used  Substance and Sexual Activity   Alcohol use: No    Alcohol/week: 0.0 standard drinks of alcohol    Comment: none in several years.    Drug use: Not Currently  Comment: stopped 2-3 yrs ago   Sexual activity: Not on file  Other Topics Concern   Not on file  Social History Narrative   Not on file   Social Determinants of Health   Financial Resource Strain: Not on file  Food Insecurity: Not on file  Transportation Needs: Not on file  Physical Activity: Not on file  Stress: Not on file  Social Connections: Not on file     Family History:  The patient's ***family history includes Hypertension in his sister.  ROS:   Please see the history of present  illness. Otherwise, review of systems is positive for ***.  All other systems are reviewed and otherwise negative.    EKG(s)/Additional Labs   EKG:  EKG is ordered today, personally reviewed, demonstrating ***  Recent Labs: 10/04/2021: TSH 3.526 02/19/2022: ALT 16; BUN 19; Creatinine, Ser 1.29; Hemoglobin 13.6; Magnesium 1.9; Platelets 226; Potassium 4.0; Sodium 140  Recent Lipid Panel    Component Value Date/Time   CHOL 156 10/04/2021 0936   TRIG 95 10/04/2021 0936   HDL 40 (L) 10/04/2021 0936   CHOLHDL 3.9 10/04/2021 0936   VLDL 19 10/04/2021 0936   LDLCALC 97 10/04/2021 0936    PHYSICAL EXAM:    VS:  There were no vitals taken for this visit.  BMI: There is no height or weight on file to calculate BMI.  GEN: Well nourished, well developed male in no acute distress HEENT: normocephalic, atraumatic Neck: no JVD, carotid bruits, or masses Cardiac: ***RRR; no murmurs, rubs, or gallops, no edema  Respiratory:  clear to auscultation bilaterally, normal work of breathing GI: soft, nontender, nondistended, + BS MS: no deformity or atrophy Skin: warm and dry, no [name] Neuro:  Alert and Oriented x 3, Strength and sensation are intact, follows commands Psych: euthymic mood, full affect  Wt Readings from Last 3 Encounters:  02/19/22 175 lb (79.4 kg)  02/03/21 181 lb (82.1 kg)  09/30/19 183 lb (83 kg)     ASSESSMENT & PLAN:   ***     Disposition: F/u with ***   Medication Adjustments/Labs and Tests Ordered: Current medicines are reviewed at length with the patient today.  Concerns regarding medicines are outlined above. Medication changes, Labs and Tests ordered today are summarized above and listed in the Patient Instructions accessible in Encounters.    Signed, Laurann Montana, PA-C  05/30/2022 10:09 AM    [facility] - [place] Location in [facility] [address] [place], [place] [address] Ph: [phone]; Fax [phone]

## 2022-06-01 ENCOUNTER — Ambulatory Visit: Payer: Medicare Other | Admitting: Physician Assistant

## 2022-06-01 DIAGNOSIS — I700 Atherosclerosis of aorta: Secondary | ICD-10-CM

## 2022-06-01 DIAGNOSIS — I10 Essential (primary) hypertension: Secondary | ICD-10-CM

## 2022-06-01 DIAGNOSIS — I428 Other cardiomyopathies: Secondary | ICD-10-CM

## 2022-06-01 DIAGNOSIS — E785 Hyperlipidemia, unspecified: Secondary | ICD-10-CM

## 2022-06-01 DIAGNOSIS — I5022 Chronic systolic (congestive) heart failure: Secondary | ICD-10-CM

## 2022-06-01 DIAGNOSIS — N1831 Chronic kidney disease, stage 3a: Secondary | ICD-10-CM

## 2022-06-19 ENCOUNTER — Other Ambulatory Visit: Payer: Self-pay | Admitting: Cardiology

## 2022-06-27 ENCOUNTER — Other Ambulatory Visit: Payer: Self-pay | Admitting: Cardiology

## 2022-07-04 ENCOUNTER — Other Ambulatory Visit: Payer: Self-pay | Admitting: Cardiology

## 2022-07-19 ENCOUNTER — Other Ambulatory Visit: Payer: Self-pay

## 2022-07-19 ENCOUNTER — Encounter (HOSPITAL_COMMUNITY): Payer: Self-pay | Admitting: *Deleted

## 2022-07-19 ENCOUNTER — Other Ambulatory Visit: Payer: Self-pay | Admitting: Cardiology

## 2022-07-19 ENCOUNTER — Emergency Department (HOSPITAL_COMMUNITY)
Admission: EM | Admit: 2022-07-19 | Discharge: 2022-07-19 | Disposition: A | Discharge status: Home / Self Care | Payer: Medicare Other | Attending: Emergency Medicine | Admitting: Emergency Medicine

## 2022-07-19 ENCOUNTER — Emergency Department (HOSPITAL_COMMUNITY): Payer: Medicare Other

## 2022-07-19 DIAGNOSIS — R944 Abnormal results of kidney function studies: Secondary | ICD-10-CM | POA: Diagnosis not present

## 2022-07-19 DIAGNOSIS — I509 Heart failure, unspecified: Secondary | ICD-10-CM | POA: Diagnosis not present

## 2022-07-19 DIAGNOSIS — R531 Weakness: Secondary | ICD-10-CM | POA: Insufficient documentation

## 2022-07-19 DIAGNOSIS — Z7982 Long term (current) use of aspirin: Secondary | ICD-10-CM | POA: Insufficient documentation

## 2022-07-19 DIAGNOSIS — Z79899 Other long term (current) drug therapy: Secondary | ICD-10-CM | POA: Insufficient documentation

## 2022-07-19 DIAGNOSIS — I130 Hypertensive heart and chronic kidney disease with heart failure and stage 1 through stage 4 chronic kidney disease, or unspecified chronic kidney disease: Secondary | ICD-10-CM | POA: Diagnosis not present

## 2022-07-19 DIAGNOSIS — R42 Dizziness and giddiness: Secondary | ICD-10-CM | POA: Insufficient documentation

## 2022-07-19 DIAGNOSIS — N183 Chronic kidney disease, stage 3 unspecified: Secondary | ICD-10-CM | POA: Diagnosis not present

## 2022-07-19 DIAGNOSIS — R001 Bradycardia, unspecified: Secondary | ICD-10-CM | POA: Insufficient documentation

## 2022-07-19 DIAGNOSIS — R799 Abnormal finding of blood chemistry, unspecified: Secondary | ICD-10-CM | POA: Diagnosis not present

## 2022-07-19 LAB — BASIC METABOLIC PANEL
Anion gap: 9 (ref 5–15)
BUN: 38 mg/dL — ABNORMAL HIGH (ref 8–23)
CO2: 26 mmol/L (ref 22–32)
Calcium: 9.7 mg/dL (ref 8.9–10.3)
Chloride: 105 mmol/L (ref 98–111)
Creatinine, Ser: 1.71 mg/dL — ABNORMAL HIGH (ref 0.61–1.24)
GFR, Estimated: 41 mL/min — ABNORMAL LOW (ref >60)
Glucose, Bld: 131 mg/dL — ABNORMAL HIGH (ref 70–99)
Potassium: 4.8 mmol/L (ref 3.5–5.1)
Sodium: 140 mmol/L (ref 135–145)

## 2022-07-19 LAB — CBC WITH DIFFERENTIAL/PLATELET
Abs Immature Granulocytes: 0.04 10*3/uL (ref 0.00–0.07)
Basophils Absolute: 0 10*3/uL (ref 0.0–0.1)
Basophils Relative: 1 %
Eosinophils Absolute: 0.1 10*3/uL (ref 0.0–0.5)
Eosinophils Relative: 2 %
HCT: 40.3 % (ref 39.0–52.0)
Hemoglobin: 13.8 g/dL (ref 13.0–17.0)
Immature Granulocytes: 1 %
Lymphocytes Relative: 15 %
Lymphs Abs: 1 10*3/uL (ref 0.7–4.0)
MCH: 32.4 pg (ref 26.0–34.0)
MCHC: 34.2 g/dL (ref 30.0–36.0)
MCV: 94.6 fL (ref 80.0–100.0)
Monocytes Absolute: 0.3 10*3/uL (ref 0.1–1.0)
Monocytes Relative: 5 %
Neutro Abs: 5 10*3/uL (ref 1.7–7.7)
Neutrophils Relative %: 76 %
Platelets: 210 10*3/uL (ref 150–400)
RBC: 4.26 MIL/uL (ref 4.22–5.81)
RDW: 12.5 % (ref 11.5–15.5)
WBC: 6.6 10*3/uL (ref 4.0–10.5)
nRBC: 0 % (ref 0.0–0.2)

## 2022-07-19 LAB — TROPONIN I (HIGH SENSITIVITY)
Troponin I (High Sensitivity): 18 ng/L — ABNORMAL HIGH (ref <18)
Troponin I (High Sensitivity): 21 ng/L — ABNORMAL HIGH (ref <18)

## 2022-07-19 LAB — URINALYSIS, ROUTINE W REFLEX MICROSCOPIC
Bilirubin Urine: NEGATIVE
Glucose, UA: NEGATIVE mg/dL
Hgb urine dipstick: NEGATIVE
Ketones, ur: NEGATIVE mg/dL
Leukocytes,Ua: NEGATIVE
Nitrite: NEGATIVE
Protein, ur: NEGATIVE mg/dL
Specific Gravity, Urine: 1.011 (ref 1.005–1.030)
pH: 6 (ref 5.0–8.0)

## 2022-07-19 MED ORDER — SODIUM CHLORIDE 0.9 % IV BOLUS
1000.0000 mL | Freq: Once | INTRAVENOUS | Status: AC
Start: 2022-07-19 — End: 2022-07-19
  Administered 2022-07-19: 1000 mL via INTRAVENOUS

## 2022-07-19 NOTE — ED Notes (Signed)
During orthostatic vitals patient states lying flat, sitting, and standing " I feel really dizzy." Patient was able to complete the orthostatic vitals sign but states the only position he does not feel dizzy in is laying in the bed with the [facility] elevated 20 degrees. PA notified.

## 2022-07-19 NOTE — ED Notes (Signed)
Called sister and informed her that patient has been discharged and will need transportation. Discharge instructions reviewed with sister. States that she will call her daughter to pick up patient and she will be to pick him up after she gets off work at 5 pm.

## 2022-07-19 NOTE — ED Notes (Signed)
Pt's pulse ox is not picking up very well. Pt's hands are very cold gave warm blankest and a warming pack on left hand where pulse is located.

## 2022-07-19 NOTE — ED Provider Notes (Signed)
[facility] EMERGENCY DEPARTMENT Provider Note   CSN: [id number] Arrival date & time: 07/19/22  1007     History  Chief Complaint  Patient presents with   Dizziness    Don [name] is a 76 y.o. male with a history including hypertension, cardiomyopathy hypercholesterolemia, history of CHF and chronic kidney disease stage III, also history of hep C, GERD presenting with lightheadedness and generalized weakness since waking this morning.  When he first woke up and try to get out of bed he became very dizzy and felt like he was going to faint.  He denies headache, vision changes, chest pain, nausea or vomiting and abdominal pain.  He does endorse having episodes of being "winded" but not currently and is typically triggered with exertion.  He was brought in by EMS and his initial blood pressure with them was 215/160.  He endorses taking his morning blood pressure medications prior to arrival.  He is on Coreg which he did take this morning.  Upon first arrival his pulse rate was 35.  He denies any recent significant illnesses, nausea, vomiting and denies risk factors for dehydration.  HPI     Home Medications Prior to Admission medications   Medication Sig Start Date End Date Taking? Authorizing Provider  albuterol (VENTOLIN HFA) 108 (90 Base) MCG/ACT inhaler Inhale 1-2 puffs into the lungs every 6 (six) hours as needed for shortness of breath. 04/08/19  Yes [provider]  aspirin EC 81 MG tablet Take 81 mg by mouth daily.   Yes [provider]  diclofenac (CATAFLAM) 50 MG tablet Take 50 mg by mouth 3 (three) times daily. 05/17/22  Yes [provider]  ENTRESTO 24-26 MG TAKE (1) TABLET BY MOUTH TWICE DAILY. 06/27/22  Yes Satira Sark, MD  furosemide (LASIX) 20 MG tablet TAKE 2 TABLETS BY MOUTH TWICE A DAY. 07/04/22  Yes Satira Sark, MD  meloxicam (MOBIC) 15 MG tablet Take 15 mg by mouth daily as needed. 05/26/22  Yes [provider]   methocarbamol (ROBAXIN) 500 MG tablet Take 1 tablet (500 mg total) by mouth every 12 (twelve) hours as needed for muscle spasms. 02/19/22  Yes Godfrey Pick, MD  omeprazole (PRILOSEC) 20 MG capsule Take 20 mg by mouth daily.   Yes [provider]  pravastatin (PRAVACHOL) 40 MG tablet Take 40 mg by mouth daily.   Yes [provider]  spironolactone (ALDACTONE) 25 MG tablet TAKE 1 TABLET BY MOUTH DAILY. 06/19/22  Yes Satira Sark, MD      Allergies    Penicillins    Review of Systems   Review of Systems  Constitutional:  Positive for fatigue. Negative for fever.  HENT:  Negative for congestion and sore throat.   Eyes: Negative.   Respiratory:  Negative for chest tightness and shortness of breath.   Cardiovascular:  Negative for chest pain, palpitations and leg swelling.  Gastrointestinal:  Negative for abdominal pain, diarrhea, nausea and vomiting.  Genitourinary: Negative.   Musculoskeletal:  Negative for arthralgias, joint swelling and neck pain.  Skin: Negative.  Negative for rash and wound.  Neurological:  Positive for light-headedness. Negative for dizziness, weakness, numbness and headaches.  Psychiatric/Behavioral: Negative.    All other systems reviewed and are negative.   Physical Exam Updated Vital Signs BP (!) 163/83   Pulse (!) 43   Temp 97.9 F (36.6 C) (Oral)   Resp 14   SpO2 100%  Physical Exam Vitals and nursing note reviewed.  Constitutional:      Appearance: He is well-developed.  HENT:     Head: Normocephalic and atraumatic.  Eyes:     Conjunctiva/sclera: Conjunctivae normal.  Cardiovascular:     Rate and Rhythm: Normal rate and regular rhythm.     Heart sounds: Normal heart sounds.  Pulmonary:     Effort: Pulmonary effort is normal.     Breath sounds: Normal breath sounds. No wheezing.  Abdominal:     General: Bowel sounds are normal.     Palpations: Abdomen is soft.     Tenderness: There is no abdominal tenderness.   Musculoskeletal:        General: Normal range of motion.     Cervical back: Normal range of motion.  Skin:    General: Skin is warm and dry.  Neurological:     Mental Status: He is alert.     ED Results / Procedures / Treatments   Labs (all labs ordered are listed, but only abnormal results are displayed) Labs Reviewed  BASIC METABOLIC PANEL - Abnormal; Notable for the following components:      Result Value   Glucose, Bld 131 (*)    BUN 38 (*)    Creatinine, Ser 1.71 (*)    GFR, Estimated 41 (*)    All other components within normal limits  TROPONIN I (HIGH SENSITIVITY) - Abnormal; Notable for the following components:   Troponin I (High Sensitivity) 18 (*)    All other components within normal limits  CBC WITH DIFFERENTIAL/PLATELET  URINALYSIS, ROUTINE W REFLEX MICROSCOPIC  TROPONIN I (HIGH SENSITIVITY)    EKG EKG Interpretation  Date/Time:  Wednesday July 19 2022 10:14:00 EDT Ventricular Rate:  46 PR Interval:  163 QRS Duration: 127 QT Interval:  471 QTC Calculation: 412 R Axis:   49 Text Interpretation: Sinus rhythm Atrial premature complexes Nonspecific intraventricular conduction delay Confirmed by Tretha Sciara [phone]) on 07/19/2022 12:52:19 PM  Radiology DG Chest Portable 1 View  Result Date: 07/19/2022 CLINICAL DATA:  Dizziness upon awakening. Bradycardia and elevated blood pressure. EXAM: PORTABLE CHEST 1 VIEW COMPARISON:  Radiographs 02/19/2022 and 04/23/2019.  CT 02/19/2022. FINDINGS: 1125 hours. The heart size and mediastinal contours are stable. Unchanged probable pleuroparenchymal scarring at the left lung base. The right lung is clear. No pneumothorax or significant pleural effusion identified. The bones appear unremarkable.  Telemetry leads overlie the chest. IMPRESSION: No evidence of acute cardiopulmonary process. Probable pleuroparenchymal scarring at the left lung base. Electronically Signed   By: Richardean Sale M.D.   On: 07/19/2022 11:47     Procedures Procedures    Medications Ordered in ED Medications  sodium chloride 0.9 % bolus 1,000 mL (0 mLs Intravenous Stopped 07/19/22 1413)    ED Course/ Medical Decision Making/ A&P Clinical Course as of 07/19/22 1425  Wed Jul 19, 2022  1048 Stable 55 YOM with near syncope hypertensive with EMS and now resolved. No headache. No syncope.    [CC]  1050 n2c [CC]  4403  Likely symptomatic bradycardia Secondary to carvedilol use.  Patient will likely require admission for medication washout and evaluation for potential need for pacemaker placement if not improving. [CC]  1414 On reevaluation, patient is grossly symptomatically improved.  We will plan for outpatient care management hold Coreg in the outpatient setting. [CC]    Clinical Course User Index [CC] Tretha Sciara, MD  Medical Decision Making Patient presenting with lightheadedness and near syncope this morning upon waking trying to get out of bed.  Initially he was very hypertensive per EMS, blood pressures are now improved.  He has maintained fairly low pulse rate here although with orthostatic maneuvers he had an appropriate pulse response up to 76.  He has received a liter of IV fluids and reports he feels much better at this time.  He did not have lightheadedness with the orthostatic maneuvers.  Amount and/or Complexity of Data Reviewed Labs: ordered.    Details: Labs are reassuring, he does have a BUN and creatinine elevation suggesting acute dehydration with a BUN of 38 and a creatinine of 1.71 so there may have been a dehydration component to today's symptoms as well. Radiology: ordered and independent interpretation performed.    Details: No acute cardiopulmonary process. ECG/medicine tests: ordered and independent interpretation performed.    Details: Bradycardia with PACs. Discussion of management or test interpretation with external provider(s): Patient discussed with Dr. Marlou Porch of  cardiology including patient's symptoms, medications and vital signs here including appropriate orthostatic vital signs.  He has advised patient to hold his Coreg at this time and assuming he is feeling improved he can be discharged home with close office follow-up, he will have his office contact this patient for an office visit.  Risk Prescription drug management. Decision regarding hospitalization.           Final Clinical Impression(s) / ED Diagnoses Final diagnoses:  Symptomatic bradycardia  Lightheadedness    Rx / DC Orders ED Discharge Orders     None         Landis Martins 07/19/22 1425    Tretha Sciara, MD 07/22/22 [phone]

## 2022-07-19 NOTE — Discharge Instructions (Addendum)
See the appointment above with cardiology.  In the interim you are advised to not take your carvedilol as this may be at least part of the reason for your symptoms this morning as it is dropping your pulse rate too low.   Make sure you are taking your other blood pressure medications however.  Also make sure you are drinking plenty of fluids as today's labs suggest that you may also have some mild dehydration which can also make you feel poorly.  In there interim,  if you have any return or worsening of symptoms, return here for a recheck.

## 2022-07-19 NOTE — ED Triage Notes (Signed)
Pt brought in by [facility] for c/o dizziness when he woke up this am  BP with ems was 215/160, pt states he has been taking his BP meds as prescribed  CBG 118  Pt denies any pain, n/v

## 2022-07-25 NOTE — Progress Notes (Signed)
Cardiology Office Note:    Date:  07/31/2022   ID:  Don [name], DOB August 21, 1946, MRN [id number]  PCP:  Lucia Gaskins, MD (Inactive)  [facility] Cardiologist:  Rozann Lesches, MD     Referring MD: No ref. provider found   Chief Complaint:  No chief complaint on file.     History of Present Illness:   Don [name] is a 76 y.o. male with  history of NICM, HTN, CKD stage 3b. Patient last saw Dr. Domenic Polite 01/2021 and repeat echo LVEF 50%(previously 45%).  Was in the ED 06/2022 with symptomatic bradycardia and HR improved with stopping metoprolol.    Patient comes in for f/u. He denies dizziness, presyncope or weakness, shortness of breath, edema. He thinks it was from eating 4 bags of potato chips that day. Can't walk because of leg/calf pain. Former heavy smoker-quit 3 yrs ago.      Past Medical History:  Diagnosis Date   Aortic atherosclerosis ([place])    Cardiomyopathy ([place])    a. EF 20 to 25% by echo in 03/2019.   Cerebrovascular disease    Chronic HFrEF (heart failure with reduced ejection fraction) ([facility])    Chronic kidney disease, stage 3a ([facility])    Cirrhosis ([facility])    Essential hypertension    Gout    Hepatitis C    Hyperlipidemia    Current Medications: Current Meds  Medication Sig   albuterol (VENTOLIN HFA) 108 (90 Base) MCG/ACT inhaler Inhale 1-2 puffs into the lungs every 6 (six) hours as needed for shortness of breath.   aspirin EC 81 MG tablet Take 81 mg by mouth daily.   carvedilol (COREG) 6.25 MG tablet Take 6.25 mg by mouth 2 (two) times daily.   Cholecalciferol (VITAMIN D) 50 MCG (2000 UT) CAPS Take 2,000 Units by mouth daily.   diclofenac (CATAFLAM) 50 MG tablet Take 50 mg by mouth 3 (three) times daily.   ENTRESTO 24-26 MG TAKE (1) TABLET BY MOUTH TWICE DAILY.   furosemide (LASIX) 20 MG tablet Take 2 tablets (40 mg total) by mouth daily.   meloxicam (MOBIC) 15 MG tablet Take 15 mg by mouth daily as needed.   omeprazole  (PRILOSEC) 20 MG capsule Take 20 mg by mouth daily.   pravastatin (PRAVACHOL) 40 MG tablet Take 40 mg by mouth daily.   spironolactone (ALDACTONE) 25 MG tablet TAKE 1 TABLET BY MOUTH DAILY.   [DISCONTINUED] furosemide (LASIX) 20 MG tablet TAKE 2 TABLETS BY MOUTH TWICE A DAY.    Allergies:   Penicillins   Social History   Tobacco Use   Smoking status: Former    Types: Cigarettes    Quit date: 09/29/2008    Years since quitting: 13.8   Smokeless tobacco: Never   Tobacco comments:    years ago  Vaping Use   Vaping Use: Never used  Substance Use Topics   Alcohol use: No    Alcohol/week: 0.0 standard drinks of alcohol    Comment: none in several years.    Drug use: Not Currently    Comment: stopped 2-3 yrs ago    Family Hx: The patient's family history includes Hypertension in his sister.  ROS     Physical Exam:    VS:  BP 114/62   Pulse 95   Ht 5\' 6"  (1.676 m)   Wt 169 lb (76.7 kg)   SpO2 96%   BMI 27.28 kg/m     Wt Readings from Last 3 Encounters:  07/31/22 169 lb (76.7 kg)  02/19/22 175 lb (79.4 kg)  02/03/21 181 lb (82.1 kg)    Physical Exam  GEN: Well nourished, well developed, in no acute distress  Neck: no JVD, carotid bruits, or masses Cardiac:RRR; no murmurs, rubs, or gallops  Respiratory:  clear to auscultation bilaterally, normal work of breathing GI: soft, nontender, nondistended, + BS Ext: without cyanosis, clubbing, or edema, can't palpate distal pulses bilaterally Neuro:  Alert and Oriented x 3,  Psych: euthymic mood, full affect        EKGs/Labs/Other Test Reviewed:    EKG:  EKG is  not ordered today.     Recent Labs: 10/04/2021: TSH 3.526 02/19/2022: ALT 16; Magnesium 1.9 07/19/2022: BUN 38; Creatinine, Ser 1.71; Hemoglobin 13.8; Platelets 210; Potassium 4.8; Sodium 140   Recent Lipid Panel Recent Labs    10/04/21 0936  CHOL 156  TRIG 95  HDL 40*  VLDL 19  LDLCALC 97     Prior CV Studies:   Echo 01/2021 throughout study. 2.  Basal inferior hypokinesis. . Left ventricular ejection fraction, by estimation, is 50%. The left ventricle has low normal function. There is mild left ventricular hypertrophy. Left ventricular diastolic parameters are consistent with Grade I diastolic dysfunction (impaired relaxation). 3. Right ventricular systolic function is normal. The right ventricular size is normal. There is normal pulmonary artery systolic pressure. 4. Left atrial size was moderately dilated. 5. The mitral valve is normal in structure. Trivial mitral valve regurgitation. 6. The aortic valve is tricuspid. Aortic valve regurgitation is trivial. Comparison(s): The left ventricular function is unchanged. Left Ventricle: Basal inferior hypokinesis. Left ventricular ejection fraction, by estimation, is 50%. The left ventricle has low normal function. The left ventricular internal cavity size was normal in size. There is mild left ventricular hypertrophy. Left ventricular diastolic parameters are consistent with Grade I diastolic dysfunction (impaired relaxation). Right Ventricle: The right ventricular size is normal. Right vetricular wall thickness was not assessed. Right ventricular systolic function is normal. There is normal pulmonary artery systolic pressure. The tricuspid regurgitant velocity is 2.45 m/s, and with an assumed right atrial pressure of 3 mmHg, the estimated right ventricular systolic pressure is 27.0 mmHg. Left Atrium: Left atrial size was moderately dilated. Right Atrium: Right atrial size was normal in size.  Echocardiogram 08/13/2019:  1. Left ventricular ejection fraction, by visual estimation, is 45%. The left ventricle has mildly decreased function. There is moderately increased left ventricular hypertrophy.  2. Left ventricular diastolic parameters are consistent with Grade I diastolic dysfunction (impaired relaxation).  3. The left ventricle demonstrates global hypokinesis.  4. Global right  ventricle has mildly reduced systolic function.The right ventricular size is mildly enlarged. No increase in right ventricular wall thickness.  5. Left atrial size was mildly dilated.  6. Right atrial size was normal.  7. Mild aortic valve annular calcification.  8. Mild mitral annular calcification.  9. The mitral valve is grossly normal. No evidence of mitral valve regurgitation. 10. The tricuspid valve is grossly normal. Tricuspid valve regurgitation is mild. 11. The aortic valve is tricuspid. Aortic valve regurgitation is not visualized. No evidence of aortic valve sclerosis or stenosis. 12. The pulmonic valve was grossly normal. Pulmonic valve regurgitation is not visualized. 13. Normal pulmonary artery systolic pressure. 14. The inferior vena cava is normal in size with greater than 50% respiratory variability, suggesting right atrial pressure of 3 mmHg. Risk Assessment/Calculations/Metrics:              ASSESSMENT &  PLAN:   No problem-specific Assessment & Plan notes found for this encounter.   Symptomatic Bradycardia in ED 06/2022 resolved with metoprolol washout. No further symptoms of dizziness and HR stable today. No changes.  NICM LVEF 45% on echo 07/2019 repeat 01/2021 LVEF 50%. No CHF on exam and Crt 1.7. decrease lasix 20 mg 2 tablets once daily. Repeat bmet in 1-2 weeks. 2 gm sodium diet. Call if edema, or shortness of breath. Continue aldactone, entresto,   HTN BP controlled  CKD stage 3B Crt up 1.71 07/19/22-try to reduce lasix  Claudication symptoms with nonpalpable distal pulses. Former smoker. Check dopplers and refer to vascular if needed.            Dispo:  No follow-ups on file.   Medication Adjustments/Labs and Tests Ordered: Current medicines are reviewed at length with the patient today.  Concerns regarding medicines are outlined above.  Tests Ordered: Orders Placed This Encounter  Procedures   US ARTERIAL LOWER EXTREMITY DUPLEX BILATERAL   Basic  metabolic panel   Medication Changes: Meds ordered this encounter  Medications   furosemide (LASIX) 20 MG tablet    Sig: Take 2 tablets (40 mg total) by mouth daily.    Dispense:  60 tablet    Refill:  [address], Jacolyn Reedy, PA-C  07/31/2022 11:47 AM    [facility] [address] [place], [place], [place]  [address] Phone: [phone]; Fax: [phone]

## 2022-07-31 ENCOUNTER — Ambulatory Visit: Payer: Medicare Other | Attending: Physician Assistant | Admitting: Physician Assistant

## 2022-07-31 ENCOUNTER — Encounter: Payer: Self-pay | Admitting: Physician Assistant

## 2022-07-31 VITALS — BP 114/62 | HR 95 | Ht 66.0 in | Wt 169.0 lb

## 2022-07-31 DIAGNOSIS — I428 Other cardiomyopathies: Secondary | ICD-10-CM

## 2022-07-31 DIAGNOSIS — R001 Bradycardia, unspecified: Secondary | ICD-10-CM

## 2022-07-31 DIAGNOSIS — I10 Essential (primary) hypertension: Secondary | ICD-10-CM

## 2022-07-31 DIAGNOSIS — I739 Peripheral vascular disease, unspecified: Secondary | ICD-10-CM

## 2022-07-31 DIAGNOSIS — N1832 Chronic kidney disease, stage 3b: Secondary | ICD-10-CM | POA: Diagnosis not present

## 2022-07-31 MED ORDER — FUROSEMIDE 20 MG PO TABS: 40.0000 mg | ORAL_TABLET | Freq: Every day | ORAL | 11 refills | Status: DC

## 2022-07-31 NOTE — Patient Instructions (Signed)
Medication Instructions:   Decrease Lasix to 40 mg ( 2 Tablets) one time daily   *If you need a refill on your cardiac medications before your next appointment, please call your pharmacy*   Lab Work: Your physician recommends that you return for lab work in: 2 Weeks or the day you have your leg [place].   If you have labs (blood work) drawn today and your tests are completely normal, you will receive your results only by: MyChart Message (if you have MyChart) OR A paper copy in the mail If you have any lab test that is abnormal or we need to change your treatment, we will call you to review the results.   Testing/Procedures: Your physician has requested that you have a lower or upper extremity arterial duplex. This test is an ultrasound of the arteries in the legs or arms. It looks at arterial blood flow in the legs and arms. Allow one hour for Lower and Upper Arterial scans. There are no restrictions or special instructions    Follow-Up: At [facility], you and your health needs are our priority.  As part of our continuing mission to provide you with exceptional heart care, we have created designated Provider Care Teams.  These Care Teams include your primary Cardiologist (physician) and Advanced Practice Providers (APPs -  Physician Assistants and Nurse Practitioners) who all work together to provide you with the care you need, when you need it.  We recommend signing up for the patient portal called "MyChart".  Sign up information is provided on this After Visit Summary.  MyChart is used to connect with patients for Virtual Visits (Telemedicine).  Patients are able to view lab/test results, encounter notes, upcoming appointments, etc.  Non-urgent messages can be sent to your provider as well.   To learn more about what you can do with MyChart, go to [email or web address].    Your next appointment:   6 month(s)  The format for your next appointment:   In Person  Provider:    Nona Dell, MD    Other Instructions Thank you for choosing [facility]!   Two Gram Sodium Diet 2000 mg  What is Sodium? Sodium is a mineral found naturally in many foods. The most significant source of sodium in the diet is table salt, which is about 40% sodium.  Processed, convenience, and preserved foods also contain a large amount of sodium.  The body needs only 500 mg of sodium daily to function,  A normal diet provides more than enough sodium even if you do not use salt.  Why Limit Sodium? A build up of sodium in the body can cause thirst, increased blood pressure, shortness of breath, and water retention.  Decreasing sodium in the diet can reduce edema and risk of heart attack or stroke associated with high blood pressure.  Keep in mind that there are many other factors involved in these health problems.  Heredity, obesity, lack of exercise, cigarette smoking, stress and what you eat all play a role.  General Guidelines: Do not add salt at the table or in cooking.  One teaspoon of salt contains over 2 grams of sodium. Read food labels Avoid processed and convenience foods Ask your dietitian before eating any foods not dicussed in the menu planning guidelines Consult your physician if you wish to use a salt substitute or a sodium containing medication such as antacids.  Limit milk and milk products to 16 oz (2 cups) per day.  Shopping Hints:  READ LABELS!! "Dietetic" does not necessarily mean low sodium. Salt and other sodium ingredients are often added to foods during processing.    Menu Planning Guidelines Food Group Choose More Often Avoid  Beverages (see also the milk group All fruit juices, low-sodium, salt-free vegetables juices, low-sodium carbonated beverages Regular vegetable or tomato juices, commercially softened water used for drinking or cooking  Breads and Cereals Enriched white, wheat, rye and pumpernickel bread, hard rolls and dinner rolls; muffins,  cornbread and waffles; most dry cereals, cooked cereal without added salt; unsalted crackers and breadsticks; low sodium or homemade bread crumbs Bread, rolls and crackers with salted tops; quick breads; instant hot cereals; pancakes; commercial bread stuffing; self-rising flower and biscuit mixes; regular bread crumbs or cracker crumbs  Desserts and Sweets Desserts and sweets mad with mild should be within allowance Instant pudding mixes and cake mixes  Fats Butter or margarine; vegetable oils; unsalted salad dressings, regular salad dressings limited to 1 Tbs; light, sour and heavy cream Regular salad dressings containing bacon fat, bacon bits, and salt pork; snack dips made with instant soup mixes or processed cheese; salted nuts  Fruits Most fresh, frozen and canned fruits Fruits processed with salt or sodium-containing ingredient (some dried fruits are processed with sodium sulfites        Vegetables Fresh, frozen vegetables and low- sodium canned vegetables Regular canned vegetables, sauerkraut, pickled vegetables, and others prepared in brine; frozen vegetables in sauces; vegetables seasoned with ham, bacon or salt pork  Condiments, Sauces, Miscellaneous  Salt substitute with physician's approval; pepper, herbs, spices; vinegar, lemon or lime juice; hot pepper sauce; garlic powder, onion powder, low sodium soy sauce (1 Tbs.); low sodium condiments (ketchup, chili sauce, mustard) in limited amounts (1 tsp.) fresh ground horseradish; unsalted tortilla chips, pretzels, potato chips, popcorn, salsa (1/4 cup) Any seasoning made with salt including garlic salt, celery salt, onion salt, and seasoned salt; sea salt, rock salt, kosher salt; meat tenderizers; monosodium glutamate; mustard, regular soy sauce, barbecue, sauce, chili sauce, teriyaki sauce, steak sauce, Worcestershire sauce, and most flavored vinegars; canned gravy and mixes; regular condiments; salted snack foods, olives, picles, relish,  horseradish sauce, catsup   Food preparation: Try these seasonings Meats:    Pork Sage, onion Serve with applesauce  Chicken Poultry seasoning, thyme, parsley Serve with cranberry sauce  Lamb Curry powder, rosemary, garlic, thyme Serve with mint sauce or jelly  Veal Marjoram, basil Serve with current jelly, cranberry sauce  Beef Pepper, bay leaf Serve with dry mustard, unsalted chive butter  Fish [place] leaf, dill Serve with unsalted lemon butter, unsalted parsley butter  Vegetables:    Asparagus Lemon juice   Broccoli Lemon juice   Carrots Mustard dressing parsley, mint, nutmeg, glazed with unsalted butter and sugar   Green beans Marjoram, lemon juice, nutmeg,dill seed   Tomatoes Basil, marjoram, onion   Spice /blend for [employer]" 4 tsp ground thyme 1 tsp ground sage 3 tsp ground rosemary 4 tsp ground marjoram   Test your knowledge A product that says "Salt Free" may still contain sodium. True or False Garlic Powder and Hot Pepper Sauce an be used as alternative seasonings.True or False Processed foods have more sodium than fresh foods.  True or False Canned Vegetables have less sodium than froze True or False   WAYS TO DECREASE YOUR SODIUM INTAKE Avoid the use of added salt in cooking and at the table.  Table salt (and other prepared seasonings which contain salt) is probably one of the  greatest sources of sodium in the diet.  Unsalted foods can gain flavor from the sweet, sour, and butter taste sensations of herbs and spices.  Instead of using salt for seasoning, try the following seasonings with the foods listed.  Remember: how you use them to enhance natural food flavors is limited only by your creativity... Allspice-Meat, fish, eggs, fruit, peas, red and yellow vegetables Almond Extract-Fruit baked goods Anise Seed-Sweet breads, fruit, carrots, beets, cottage cheese, cookies (tastes like licorice) Basil-Meat, fish, eggs, vegetables, rice, vegetables salads, soups, sauces Bay  Leaf-Meat, fish, stews, poultry Burnet-Salad, vegetables (cucumber-like flavor) Caraway Seed-Bread, cookies, cottage cheese, meat, vegetables, cheese, rice Cardamon-Baked goods, fruit, soups Celery Powder or seed-Salads, salad dressings, sauces, meatloaf, soup, bread.Do not use  celery salt Chervil-Meats, salads, fish, eggs, vegetables, cottage cheese (parsley-like flavor) Chili Power-Meatloaf, chicken cheese, corn, eggplant, egg dishes Chives-Salads cottage cheese, egg dishes, soups, vegetables, sauces Cilantro-Salsa, casseroles Cinnamon-Baked goods, fruit, pork, lamb, chicken, carrots Cloves-Fruit, baked goods, fish, pot roast, green beans, beets, carrots Coriander-Pastry, cookies, meat, salads, cheese (lemon-orange flavor) Cumin-Meatloaf, fish,cheese, eggs, cabbage,fruit pie (caraway flavor) [employer], fruit, eggs, fish, poultry, cottage cheese, vegetables Dill Seed-Meat, cottage cheese, poultry, vegetables, fish, salads, bread Fennel Seed-Bread, cookies, apples, pork, eggs, fish, beets, cabbage, cheese, Licorice-like flavor Garlic-(buds or powder) Salads, meat, poultry, fish, bread, butter, vegetables, potatoes.Do not  use garlic salt Ginger-Fruit, vegetables, baked goods, meat, fish, poultry Horseradish Root-Meet, vegetables, butter Lemon Juice or Extract-Vegetables, fruit, tea, baked goods, fish salads Mace-Baked goods fruit, vegetables, fish, poultry (taste like nutmeg) Maple Extract-Syrups Marjoram-Meat, chicken, fish, vegetables, breads, green salads (taste like Sage) Mint-Tea, lamb, sherbet, vegetables, desserts, carrots, cabbage Mustard, Dry or Seed-Cheese, eggs, meats, vegetables, poultry Nutmeg-Baked goods, fruit, chicken, eggs, vegetables, desserts Onion Powder-Meat, fish, poultry, vegetables, cheese, eggs, bread, rice salads (Do not use   Onion salt) Orange Extract-Desserts, baked goods Oregano-Pasta, eggs, cheese, onions, pork, lamb, fish, chicken, vegetables,  green salads Paprika-Meat, fish, poultry, eggs, cheese, vegetables Parsley Flakes-Butter, vegetables, meat fish, poultry, eggs, bread, salads (certain forms may   Contain sodium Pepper-Meat fish, poultry, vegetables, eggs Peppermint Extract-Desserts, baked goods Poppy Seed-Eggs, bread, cheese, fruit dressings, baked goods, noodles, vegetables, cottage  [employer], poultry, meat, fish, cauliflower, turnips,eggs bread Saffron-Rice, bread, veal, chicken, fish, eggs Sage-Meat, fish, poultry, onions, eggplant, tomateos, pork, stews Savory-Eggs, salads, poultry, meat, rice, vegetables, soups, pork Tarragon-Meat, poultry, fish, eggs, butter, vegetables (licorice-like flavor)  Thyme-Meat, poultry, fish, eggs, vegetables, (clover-like flavor), sauces, soups Tumeric-Salads, butter, eggs, fish, rice, vegetables (saffron-like flavor) Vanilla Extract-Baked goods, candy Vinegar-Salads, vegetables, meat marinades Walnut Extract-baked goods, candy   2. Choose your Foods Wisely   The following is a list of foods to avoid which are high in sodium:  Meats-Avoid all smoked, canned, salt cured, dried and kosher meat and fish as well as Anchovies   [employer] meats:Bologna, Liverwurst, Pastrami Canned meat or fish  Marinated herring Caviar    Pepperoni Corned Beef   Pizza Dried chipped beef  Salami Frozen breaded fish or meat Salt pork Frankfurters or hot dogs  Sardines Gefilte fish   Sausage Ham (boiled ham, Proscuitto Smoked butt    spiced ham)   Spam      TV Dinners Vegetables Canned vegetables (Regular) Relish Canned mushrooms  Sauerkraut Olives    Tomato juice Pickles  Bakery and Dessert Products Canned puddings  Cream pies Cheesecake   Decorated cakes Cookies  Beverages/Juices Tomato juice, regular  Gatorade   V-8 vegetable juice, regular  Breads and Cereals Biscuit mixes   Salted potato chips, corn chips, pretzels Bread  stuffing mixes  Salted crackers and rolls Pancake and waffle mixes Self-rising flour  Seasonings Accent    Meat sauces Barbecue sauce  Meat tenderizer Catsup    Monosodium glutamate (MSG) Celery salt   Onion salt Chili sauce   Prepared mustard Garlic salt   Salt, seasoned salt, sea salt Gravy mixes   Soy sauce Horseradish   Steak sauce Ketchup   Tartar sauce Lite salt    Teriyaki sauce Marinade mixes   Worcestershire sauce  Others Baking powder   Cocoa and cocoa mixes Baking soda   Commercial casserole mixes Candy-caramels, chocolate  Dehydrated soups    Bars, fudge,nougats  Instant rice and pasta mixes Canned broth or soup  Maraschino cherries Cheese, aged and processed cheese and cheese spreads  Learning Assessment Quiz  Indicated T (for True) or F (for False) for each of the following statements:  _____ Fresh fruits and vegetables and unprocessed grains are generally low in sodium _____ Water may contain a considerable amount of sodium, depending on the source _____ You can always tell if a food is high in sodium by tasting it _____ Certain laxatives my be high in sodium and should be avoided unless prescribed   by a physician or pharmacist _____ Salt substitutes may be used freely by anyone on a sodium restricted diet _____ Sodium is present in table salt, food additives and as a natural component of   most foods _____ Table salt is approximately 90% sodium _____ Limiting sodium intake may help prevent excess fluid accumulation in the body _____ On a sodium-restricted diet, seasonings such as bouillon soy sauce, and    cooking wine should be used in place of table salt _____ On an ingredient list, a product which lists monosodium glutamate as the first   ingredient is an appropriate food to include on a low sodium diet  Circle the best answer(s) to the following statements (Hint: there may be more than one correct answer)  11. On a low-sodium diet, some acceptable snack  items are:    A. Olives  F. Bean dip   K. Grapefruit juice    B. Salted Pretzels G. Commercial Popcorn   L. Canned peaches    C. Carrot Sticks  H. Bouillon   M. Unsalted nuts   D. [place] fries  I. Peanut butter crackers N. Salami   E. Sweet pickles J. Tomato Juice   O. Pizza  12.  Seasonings that may be used freely on a reduced - sodium diet include   A. Lemon wedges F.Monosodium glutamate K. Celery seed    B.Soysauce   G. Pepper   L. Mustard powder   C. Sea salt  H. Cooking wine  M. Onion flakes   D. Vinegar  E. Prepared horseradish N. Salsa   E. Sage   J. Worcestershire sauce  O. Chutney   Important Information About Sugar

## 2022-08-11 ENCOUNTER — Ambulatory Visit (HOSPITAL_COMMUNITY): Admission: RE | Admit: 2022-08-11 | Payer: Medicare Other | Source: Ambulatory Visit

## 2022-09-22 ENCOUNTER — Other Ambulatory Visit: Payer: Self-pay | Admitting: Cardiology

## 2022-11-27 NOTE — Progress Notes (Deleted)
Cardiology Office Note:    Date:  11/27/2022   ID:  Don [name], DOB 1946-03-08, MRN [id number]  PCP:  Lucia Gaskins, MD (Inactive)  [facility] Providers Cardiologist:  Rozann Lesches, MD { Click to update primary MD,subspecialty MD or APP then REFRESH:1}  *** Referring MD: No ref. provider found   Chief Complaint:  No chief complaint on file. {Click here for Visit Info    :1}    History of Present Illness:   Don [name] is a 77 y.o. male with history of NICM, HTN, CKD stage 3b. Patient last saw Dr. Domenic Polite 01/2021 and repeat echo LVEF 50%(previously 45%).   Was in the ED 06/2022 with symptomatic bradycardia and HR improved with stopping metoprolol.       I saw the patient 07/2022 and doing well.except for claudicaiton symptoms. I ordered dopplers but don't see they were done.     Past Medical History:  Diagnosis Date   Aortic atherosclerosis ([place])    Cardiomyopathy ([place])    a. EF 20 to 25% by echo in 03/2019.   Cerebrovascular disease    Chronic HFrEF (heart failure with reduced ejection fraction) (H[facility])    Chronic kidney disease, stage 3a ([place])    Cirrhosis ([place])    Essential hypertension    Gout    Hepatitis C    Hyperlipidemia    Current Medications: No outpatient medications have been marked as taking for the 12/11/22 encounter (Appointment) with [name], PA-C.    Allergies:   Penicillins   Social History   Tobacco Use   Smoking status: Former    Types: Cigarettes    Quit date: 09/29/2008    Years since quitting: 14.1   Smokeless tobacco: Never   Tobacco comments:    years ago  Vaping Use   Vaping Use: Never used  Substance Use Topics   Alcohol use: No    Alcohol/week: 0.0 standard drinks of alcohol    Comment: none in several years.    Drug use: Not Currently    Comment: stopped 2-3 yrs ago    Family Hx: The patient's family history includes Hypertension in his sister.  ROS     Physical Exam:    VS:  There were no  vitals taken for this visit.    Wt Readings from Last 3 Encounters:  07/31/22 169 lb (76.7 kg)  02/19/22 175 lb (79.4 kg)  02/03/21 181 lb (82.1 kg)    Physical Exam  GEN: Well nourished, well developed, in no acute distress  HEENT: normal  Neck: no JVD, carotid bruits, or masses Cardiac:RRR; no murmurs, rubs, or gallops  Respiratory:  clear to auscultation bilaterally, normal work of breathing GI: soft, nontender, nondistended, + BS Ext: without cyanosis, clubbing, or edema, Good distal pulses bilaterally MS: no deformity or atrophy  Skin: warm and dry, no rash Neuro:  Alert and Oriented x 3, Strength and sensation are intact Psych: euthymic mood, full affect        EKGs/Labs/Other Test Reviewed:    EKG:  EKG is *** ordered today.  The ekg ordered today demonstrates ***  Recent Labs: 02/19/2022: ALT 16; Magnesium 1.9 07/19/2022: BUN 38; Creatinine, Ser 1.71; Hemoglobin 13.8; Platelets 210; Potassium 4.8; Sodium 140   Recent Lipid Panel No results for input(s): "CHOL", "TRIG", "HDL", "VLDL", "LDLCALC", "LDLDIRECT" in the last 8760 hours.   Prior CV Studies: {Select studies to display:26339}  Echo 01/2021 throughout study. 2. Basal inferior hypokinesis. . Left ventricular  ejection fraction, by estimation, is 50%. The left ventricle has low normal function. There is mild left ventricular hypertrophy. Left ventricular diastolic parameters are consistent with Grade I diastolic dysfunction (impaired relaxation). 3. Right ventricular systolic function is normal. The right ventricular size is normal. There is normal pulmonary artery systolic pressure. 4. Left atrial size was moderately dilated. 5. The mitral valve is normal in structure. Trivial mitral valve regurgitation. 6. The aortic valve is tricuspid. Aortic valve regurgitation is trivial. Comparison(s): The left ventricular function is unchanged. Left Ventricle: Basal inferior hypokinesis. Left ventricular ejection  fraction, by estimation, is 50%. The left ventricle has low normal function. The left ventricular internal cavity size was normal in size. There is mild left ventricular hypertrophy. Left ventricular diastolic parameters are consistent with Grade I diastolic dysfunction (impaired relaxation). Right Ventricle: The right ventricular size is normal. Right vetricular wall thickness was not assessed. Right ventricular systolic function is normal. There is normal pulmonary artery systolic pressure. The tricuspid regurgitant velocity is 2.45 m/s, and with an assumed right atrial pressure of 3 mmHg, the estimated right ventricular systolic pressure is 123456 mmHg. Left Atrium: Left atrial size was moderately dilated. Right Atrium: Right atrial size was normal in size.   Echocardiogram 08/13/2019:  1. Left ventricular ejection fraction, by visual estimation, is 45%. The left ventricle has mildly decreased function. There is moderately increased left ventricular hypertrophy.  2. Left ventricular diastolic parameters are consistent with Grade I diastolic dysfunction (impaired relaxation).  3. The left ventricle demonstrates global hypokinesis.  4. Global right ventricle has mildly reduced systolic function.The right ventricular size is mildly enlarged. No increase in right ventricular wall thickness.  5. Left atrial size was mildly dilated.  6. Right atrial size was normal.  7. Mild aortic valve annular calcification.  8. Mild mitral annular calcification.  9. The mitral valve is grossly normal. No evidence of mitral valve regurgitation. 10. The tricuspid valve is grossly normal. Tricuspid valve regurgitation is mild. 11. The aortic valve is tricuspid. Aortic valve regurgitation is not visualized. No evidence of aortic valve sclerosis or stenosis. 12. The pulmonic valve was grossly normal. Pulmonic valve regurgitation is not visualized. 13. Normal pulmonary artery systolic pressure. 14. The inferior vena  cava is normal in size with greater than 50% respiratory variability, suggesting right atrial pressure of 3 mmHg.   Risk Assessment/Calculations/Metrics:   {Does this patient have ATRIAL FIBRILLATION?:[id number]}     No BP recorded.  {Refresh Note OR Click here to enter BP  :1}***    ASSESSMENT & PLAN:   No problem-specific Assessment & Plan notes found for this encounter.   Symptomatic Bradycardia in ED 06/2022 resolved with metoprolol washout. No further symptoms of dizziness and HR stable today. No changes.   NICM LVEF 45% on echo 07/2019 repeat 01/2021 LVEF 50%. No CHF on exam and Crt 1.7. decrease lasix 20 mg 2 tablets once daily. Repeat bmet in 1-2 weeks. 2 gm sodium diet. Call if edema, or shortness of breath. Continue aldactone, entresto,    HTN BP controlled   CKD stage 3B Crt up 1.71 07/19/22-try to reduce lasix   Claudication symptoms with nonpalpable distal pulses. Former smoker. Check dopplers and refer to vascular if needed.          {Are you ordering a CV Procedure (e.g. stress test, cath, DCCV, TEE, etc)?   Press F2        :[id number]   Dispo:  No follow-ups on file.   Medication Adjustments/Labs and  Tests Ordered: Current medicines are reviewed at length with the patient today.  Concerns regarding medicines are outlined above.  Tests Ordered: No orders of the defined types were placed in this encounter.  Medication Changes: No orders of the defined types were placed in this encounter.  Signed, Ermalinda Barrios, PA-C  11/27/2022 2:33 PM    [address] [place], [place]  [address] Phone: [phone]; Fax: [phone]

## 2022-12-11 ENCOUNTER — Ambulatory Visit: Payer: 59 | Attending: Physician Assistant | Admitting: Physician Assistant

## 2022-12-12 ENCOUNTER — Encounter: Payer: Self-pay | Admitting: Physician Assistant

## 2023-02-20 NOTE — Progress Notes (Signed)
Cardiology Office Note:    Date:  02/26/2023   ID:  Don [name], DOB 1945-12-16, MRN [id number]  PCP:  Oval Linsey, MD (Inactive)  [facility] Providers Cardiologist:  Nona Dell, MD     Referring MD: No ref. provider found   Chief Complaint:  Follow-up     History of Present Illness:   Don [name] is a 77 y.o. male with  history of NICM, HTN, CKD stage 3b. Patient last saw Dr. Diona Browner 01/2021 and repeat echo LVEF 50%(previously 45%).   Was in the ED 06/2022 with symptomatic bradycardia and HR improved with stopping metoprolol.    I saw the patient 07/2022 and having claudication symptoms.   I ordered arterial dopplers but never done.  Patient comes in for f/u. Denies chest pain, dyspnea, edema, dizziness or presyncope. Has arthritis in his hands. Stocks beer in coolers for work. No regular exercise. Not complaining of claudications symptoms anymore.      Past Medical History:  Diagnosis Date   Aortic atherosclerosis (HCC)    Cardiomyopathy (HCC)    a. EF 20 to 25% by echo in 03/2019.   Cerebrovascular disease    Chronic HFrEF (heart failure with reduced ejection fraction) (HCC)    Chronic kidney disease, stage 3a (HCC)    Cirrhosis (HCC)    Essential hypertension    Gout    Hepatitis C    Hyperlipidemia    Current Medications: Current Meds  Medication Sig   aspirin EC 81 MG tablet Take 81 mg by mouth daily.   carvedilol (COREG) 6.25 MG tablet Take 6.25 mg by mouth 2 (two) times daily.   Cholecalciferol (VITAMIN D) 50 MCG (2000 UT) CAPS Take 2,000 Units by mouth daily.   diclofenac (CATAFLAM) 50 MG tablet Take 50 mg by mouth 3 (three) times daily.   ENTRESTO 24-26 MG TAKE (1) TABLET BY MOUTH TWICE DAILY.   furosemide (LASIX) 20 MG tablet Take 2 tablets (40 mg total) by mouth daily.   meloxicam (MOBIC) 15 MG tablet Take 15 mg by mouth daily as needed.   omeprazole (PRILOSEC) 20 MG capsule Take 20 mg by mouth daily.   pravastatin (PRAVACHOL)  40 MG tablet Take 40 mg by mouth daily.   spironolactone (ALDACTONE) 25 MG tablet TAKE 1 TABLET BY MOUTH DAILY.    Allergies:   Penicillins   Social History   Tobacco Use   Smoking status: Former    Types: Cigarettes    Quit date: 09/29/2008    Years since quitting: 14.4   Smokeless tobacco: Never   Tobacco comments:    years ago  Vaping Use   Vaping Use: Never used  Substance Use Topics   Alcohol use: No    Alcohol/week: 0.0 standard drinks of alcohol    Comment: none in several years.    Drug use: Not Currently    Comment: stopped 2-3 yrs ago    Family Hx: The patient's family history includes Hypertension in his sister.  ROS     Physical Exam:    VS:  BP 130/72   Pulse 66   Ht 5\' 6"  (1.676 m)   Wt 172 lb (78 kg)   SpO2 98%   BMI 27.76 kg/m     Wt Readings from Last 3 Encounters:  02/26/23 172 lb (78 kg)  07/31/22 169 lb (76.7 kg)  02/19/22 175 lb (79.4 kg)    Physical Exam  GEN: Thin, no acute distress  Neck: no JVD, carotid  bruits, or masses Cardiac:RRR; no murmurs, rubs, or gallops  Respiratory:  clear to auscultation bilaterally, normal work of breathing GI: soft, nontender, nondistended, + BS Ext: without cyanosis, clubbing, or edema, decreased distal pulses bilaterally Neuro:  Alert and Oriented x 3 Psych: euthymic mood, full affect        EKGs/Labs/Other Test Reviewed:    EKG:  EKG is  ordered today.  The ekg ordered today demonstrates NSR with PAC's nonspecific ST changes unchanged from prior tracing.  Recent Labs: 07/19/2022: BUN 38; Creatinine, Ser 1.71; Hemoglobin 13.8; Platelets 210; Potassium 4.8; Sodium 140   Recent Lipid Panel No results for input(s): "CHOL", "TRIG", "HDL", "VLDL", "LDLCALC", "LDLDIRECT" in the last 8760 hours.   Prior CV Studies:    Echo 01/2021 throughout study. 2. Basal inferior hypokinesis. . Left ventricular ejection fraction, by estimation, is 50%. The left ventricle has low normal function. There is mild  left ventricular hypertrophy. Left ventricular diastolic parameters are consistent with Grade I diastolic dysfunction (impaired relaxation). 3. Right ventricular systolic function is normal. The right ventricular size is normal. There is normal pulmonary artery systolic pressure. 4. Left atrial size was moderately dilated. 5. The mitral valve is normal in structure. Trivial mitral valve regurgitation. 6. The aortic valve is tricuspid. Aortic valve regurgitation is trivial. Comparison(s): The left ventricular function is unchanged. Left Ventricle: Basal inferior hypokinesis. Left ventricular ejection fraction, by estimation, is 50%. The left ventricle has low normal function. The left ventricular internal cavity size was normal in size. There is mild left ventricular hypertrophy. Left ventricular diastolic parameters are consistent with Grade I diastolic dysfunction (impaired relaxation). Right Ventricle: The right ventricular size is normal. Right vetricular wall thickness was not assessed. Right ventricular systolic function is normal. There is normal pulmonary artery systolic pressure. The tricuspid regurgitant velocity is 2.45 m/s, and with an assumed right atrial pressure of 3 mmHg, the estimated right ventricular systolic pressure is 27.0 mmHg. Left Atrium: Left atrial size was moderately dilated. Right Atrium: Right atrial size was normal in size.   Echocardiogram 08/13/2019:  1. Left ventricular ejection fraction, by visual estimation, is 45%. The left ventricle has mildly decreased function. There is moderately increased left ventricular hypertrophy.  2. Left ventricular diastolic parameters are consistent with Grade I diastolic dysfunction (impaired relaxation).  3. The left ventricle demonstrates global hypokinesis.  4. Global right ventricle has mildly reduced systolic function.The right ventricular size is mildly enlarged. No increase in right ventricular wall thickness.  5. Left  atrial size was mildly dilated.  6. Right atrial size was normal.  7. Mild aortic valve annular calcification.  8. Mild mitral annular calcification.  9. The mitral valve is grossly normal. No evidence of mitral valve regurgitation. 10. The tricuspid valve is grossly normal. Tricuspid valve regurgitation is mild. 11. The aortic valve is tricuspid. Aortic valve regurgitation is not visualized. No evidence of aortic valve sclerosis or stenosis. 12. The pulmonic valve was grossly normal. Pulmonic valve regurgitation is not visualized. 13. Normal pulmonary artery systolic pressure. 14. The inferior vena cava is normal in size with greater than 50% respiratory variability, suggesting right atrial pressure of 3 mmHg.   Risk Assessment/Calculations/Metrics:              ASSESSMENT & PLAN:   No problem-specific Assessment & Plan notes found for this encounter.   Symptomatic Bradycardia in ED 06/2022 resolved with metoprolol washout. No further symptoms of dizziness and HR stable today-NSR with PAC's. No changes.  NICM LVEF 45% on echo 07/2019 repeat 01/2021 LVEF 50%. No CHF on exam and Crt 1.7 06/2022. I recommended he decrease lasix 20 mg 2 tablets once daily and Repeat bmet in 1-2 weeks but never done. Will check bmet and cbc today. . 2 gm sodium diet. Call if edema, or shortness of breath. Continue aldactone, entresto,    HTN BP controlled   CKD stage 3B Crt up 1.71 07/19/22-repeat todya   Claudication symptoms LOV but no complaints today.  Doesn't want to have duplex.            Dispo:  No follow-ups on file.   Medication Adjustments/Labs and Tests Ordered: Current medicines are reviewed at length with the patient today.  Concerns regarding medicines are outlined above.  Tests Ordered: Orders Placed This Encounter  Procedures   CBC   Basic metabolic panel   EKG 12-Lead   Medication Changes: No orders of the defined types were placed in this encounter.  Elson Clan, PA-C  02/26/2023 11:11 AM    [facility] [address] [place], [place], [place]  [address] Phone: [phone]; Fax: [phone]

## 2023-02-26 ENCOUNTER — Encounter: Payer: Self-pay | Admitting: Physician Assistant

## 2023-02-26 ENCOUNTER — Ambulatory Visit: Payer: 59 | Attending: Physician Assistant | Admitting: Physician Assistant

## 2023-02-26 ENCOUNTER — Other Ambulatory Visit (HOSPITAL_COMMUNITY)
Admission: RE | Admit: 2023-02-26 | Discharge: 2023-02-26 | Disposition: A | Discharge status: Home / Self Care | Payer: 59 | Source: Ambulatory Visit | Attending: Physician Assistant | Admitting: Physician Assistant

## 2023-02-26 ENCOUNTER — Telehealth: Payer: Self-pay

## 2023-02-26 VITALS — BP 130/72 | HR 66 | Ht 66.0 in | Wt 172.0 lb

## 2023-02-26 DIAGNOSIS — N1832 Chronic kidney disease, stage 3b: Secondary | ICD-10-CM

## 2023-02-26 DIAGNOSIS — I739 Peripheral vascular disease, unspecified: Secondary | ICD-10-CM | POA: Diagnosis not present

## 2023-02-26 DIAGNOSIS — I10 Essential (primary) hypertension: Secondary | ICD-10-CM

## 2023-02-26 DIAGNOSIS — R001 Bradycardia, unspecified: Secondary | ICD-10-CM | POA: Diagnosis not present

## 2023-02-26 DIAGNOSIS — I428 Other cardiomyopathies: Secondary | ICD-10-CM

## 2023-02-26 LAB — BASIC METABOLIC PANEL
Anion gap: 9 (ref 5–15)
BUN: 30 mg/dL — ABNORMAL HIGH (ref 8–23)
CO2: 25 mmol/L (ref 22–32)
Calcium: 8.7 mg/dL — ABNORMAL LOW (ref 8.9–10.3)
Chloride: 101 mmol/L (ref 98–111)
Creatinine, Ser: 1.86 mg/dL — ABNORMAL HIGH (ref 0.61–1.24)
GFR, Estimated: 37 mL/min — ABNORMAL LOW (ref >60)
Glucose, Bld: 103 mg/dL — ABNORMAL HIGH (ref 70–99)
Potassium: 4.4 mmol/L (ref 3.5–5.1)
Sodium: 135 mmol/L (ref 135–145)

## 2023-02-26 LAB — CBC
HCT: 32.8 % — ABNORMAL LOW (ref 39.0–52.0)
Hemoglobin: 11.1 g/dL — ABNORMAL LOW (ref 13.0–17.0)
MCH: 32.7 pg (ref 26.0–34.0)
MCHC: 33.8 g/dL (ref 30.0–36.0)
MCV: 96.8 fL (ref 80.0–100.0)
Platelets: 230 10*3/uL (ref 150–400)
RBC: 3.39 MIL/uL — ABNORMAL LOW (ref 4.22–5.81)
RDW: 11.9 % (ref 11.5–15.5)
WBC: 6.5 10*3/uL (ref 4.0–10.5)
nRBC: 0 % (ref 0.0–0.2)

## 2023-02-26 MED ORDER — FUROSEMIDE 20 MG PO TABS: ORAL_TABLET | ORAL | 3 refills | Status: DC

## 2023-02-26 NOTE — Telephone Encounter (Signed)
I spoke with patients sister as his phone has been disconnected. Also pcp is no longer [facility] but [facility].  He will decrease Lasix to 20 mg daily (from 40 mg) and take an additional 20 mg for 2-3 pound weight gain overnight.   Sister says patient has not complained of blood in stool or black BM. I told her they need to f/u with [facility] clinic and that I would fax labs to them.

## 2023-02-26 NOTE — Telephone Encounter (Signed)
-----   Message from Dyann Kief, [place] sent at 02/26/2023 12:08 PM EDT ----- Kidney function up to 1.86. try to decrease lasix 20 mg once daily-can take extra 20 mg for edema, weight gain 2-3 lbs overnight. He's mildly anemic. Ask him if any blood in stool or black BM. Should f/u with PCP for anemia. thanks

## 2023-02-26 NOTE — Patient Instructions (Signed)
Medication Instructions:  Your physician recommends that you continue on your current medications as directed. Please refer to the Current Medication list given to you today.   Labwork: CBC,BMET today  Testing/Procedures: None today  Follow-Up: 6 months Dr.McDowell  Any Other Special Instructions Will Be Listed Below (If Applicable).  If you need a refill on your cardiac medications before your next appointment, please call your pharmacy.

## 2023-03-26 ENCOUNTER — Telehealth: Payer: Self-pay | Admitting: Physician Assistant

## 2023-03-26 MED ORDER — CARVEDILOL 6.25 MG PO TABS: 6.2500 mg | ORAL_TABLET | Freq: Two times a day (BID) | ORAL | 1 refills | Status: DC

## 2023-03-26 NOTE — Telephone Encounter (Signed)
*  STAT* If patient is at the pharmacy, call can be transferred to refill team.   1. Which medications need to be refilled? (please list name of each medication and dose if known) Carvedilol  2. Which pharmacy/location (including street and city if local pharmacy) is medication to be sent to? [facility]  3. Do they need a 30 day or 90 day supply? 90 days and refills-please call in today-- out of medicine for 3 days

## 2023-03-26 NOTE — Telephone Encounter (Signed)
Refilled as requested,seen in clinic in June

## 2023-07-19 ENCOUNTER — Telehealth: Payer: Self-pay | Admitting: Cardiology

## 2023-07-19 MED ORDER — ENTRESTO 24-26 MG PO TABS: 1.0000 | ORAL_TABLET | Freq: Two times a day (BID) | ORAL | 3 refills | Status: DC

## 2023-07-19 NOTE — Telephone Encounter (Signed)
*  STAT* If patient is at the pharmacy, call can be transferred to refill team.   1. Which medications need to be refilled? (please list name of each medication and dose if known)   ENTRESTO 24-26 MG     4. Which pharmacy/location (including street and city if local pharmacy) is medication to be sent to? [facility] - [place], [place] - [address]     5. Do they need a 30 day or 90 day supply? 90    Pt completely out

## 2023-07-19 NOTE — Telephone Encounter (Signed)
Medication refill request completed.  

## 2023-07-24 ENCOUNTER — Emergency Department (HOSPITAL_COMMUNITY): Payer: 59

## 2023-07-24 ENCOUNTER — Emergency Department (HOSPITAL_COMMUNITY)
Admission: EM | Admit: 2023-07-24 | Discharge: 2023-07-24 | Disposition: A | Discharge status: Home / Self Care | Payer: 59 | Attending: Emergency Medicine | Admitting: Emergency Medicine

## 2023-07-24 ENCOUNTER — Other Ambulatory Visit: Payer: Self-pay

## 2023-07-24 DIAGNOSIS — I110 Hypertensive heart disease with heart failure: Secondary | ICD-10-CM | POA: Insufficient documentation

## 2023-07-24 DIAGNOSIS — M7989 Other specified soft tissue disorders: Secondary | ICD-10-CM | POA: Insufficient documentation

## 2023-07-24 DIAGNOSIS — K5730 Diverticulosis of large intestine without perforation or abscess without bleeding: Secondary | ICD-10-CM | POA: Diagnosis not present

## 2023-07-24 DIAGNOSIS — S0990XA Unspecified injury of head, initial encounter: Secondary | ICD-10-CM | POA: Diagnosis not present

## 2023-07-24 DIAGNOSIS — M79644 Pain in right finger(s): Secondary | ICD-10-CM | POA: Insufficient documentation

## 2023-07-24 DIAGNOSIS — I509 Heart failure, unspecified: Secondary | ICD-10-CM | POA: Insufficient documentation

## 2023-07-24 DIAGNOSIS — W06XXXA Fall from bed, initial encounter: Secondary | ICD-10-CM | POA: Diagnosis not present

## 2023-07-24 DIAGNOSIS — R112 Nausea with vomiting, unspecified: Secondary | ICD-10-CM | POA: Insufficient documentation

## 2023-07-24 DIAGNOSIS — I700 Atherosclerosis of aorta: Secondary | ICD-10-CM | POA: Diagnosis not present

## 2023-07-24 DIAGNOSIS — Z7982 Long term (current) use of aspirin: Secondary | ICD-10-CM | POA: Diagnosis not present

## 2023-07-24 LAB — URINALYSIS, ROUTINE W REFLEX MICROSCOPIC
Bacteria, UA: NONE SEEN
Bilirubin Urine: NEGATIVE
Glucose, UA: NEGATIVE mg/dL
Ketones, ur: NEGATIVE mg/dL
Leukocytes,Ua: NEGATIVE
Nitrite: NEGATIVE
Protein, ur: NEGATIVE mg/dL
Specific Gravity, Urine: 1.01 (ref 1.005–1.030)
pH: 7 (ref 5.0–8.0)

## 2023-07-24 LAB — CBC WITH DIFFERENTIAL/PLATELET
Abs Immature Granulocytes: 0.04 10*3/uL (ref 0.00–0.07)
Basophils Absolute: 0 10*3/uL (ref 0.0–0.1)
Basophils Relative: 0 %
Eosinophils Absolute: 0.2 10*3/uL (ref 0.0–0.5)
Eosinophils Relative: 2 %
HCT: 33.7 % — ABNORMAL LOW (ref 39.0–52.0)
Hemoglobin: 11.4 g/dL — ABNORMAL LOW (ref 13.0–17.0)
Immature Granulocytes: 1 %
Lymphocytes Relative: 9 %
Lymphs Abs: 0.8 10*3/uL (ref 0.7–4.0)
MCH: 32.5 pg (ref 26.0–34.0)
MCHC: 33.8 g/dL (ref 30.0–36.0)
MCV: 96 fL (ref 80.0–100.0)
Monocytes Absolute: 0.5 10*3/uL (ref 0.1–1.0)
Monocytes Relative: 6 %
Neutro Abs: 7.1 10*3/uL (ref 1.7–7.7)
Neutrophils Relative %: 82 %
Platelets: 241 10*3/uL (ref 150–400)
RBC: 3.51 MIL/uL — ABNORMAL LOW (ref 4.22–5.81)
RDW: 12.3 % (ref 11.5–15.5)
WBC: 8.7 10*3/uL (ref 4.0–10.5)
nRBC: 0 % (ref 0.0–0.2)

## 2023-07-24 LAB — COMPREHENSIVE METABOLIC PANEL
ALT: 17 U/L (ref 0–44)
AST: 22 U/L (ref 15–41)
Albumin: 3.6 g/dL (ref 3.5–5.0)
Alkaline Phosphatase: 55 U/L (ref 38–126)
Anion gap: 10 (ref 5–15)
BUN: 19 mg/dL (ref 8–23)
CO2: 25 mmol/L (ref 22–32)
Calcium: 9.4 mg/dL (ref 8.9–10.3)
Chloride: 101 mmol/L (ref 98–111)
Creatinine, Ser: 1.5 mg/dL — ABNORMAL HIGH (ref 0.61–1.24)
GFR, Estimated: 48 mL/min — ABNORMAL LOW (ref >60)
Glucose, Bld: 129 mg/dL — ABNORMAL HIGH (ref 70–99)
Potassium: 4.3 mmol/L (ref 3.5–5.1)
Sodium: 136 mmol/L (ref 135–145)
Total Bilirubin: 0.8 mg/dL (ref 0.3–1.2)
Total Protein: 7.4 g/dL (ref 6.5–8.1)

## 2023-07-24 LAB — LIPASE, BLOOD: Lipase: 36 U/L (ref 11–51)

## 2023-07-24 MED ORDER — DEXAMETHASONE SODIUM PHOSPHATE 10 MG/ML IJ SOLN: 10.0000 mg | Freq: Once | INTRAMUSCULAR | Status: DC

## 2023-07-24 MED ORDER — ONDANSETRON HCL 4 MG/2ML IJ SOLN
4.0000 mg | Freq: Once | INTRAMUSCULAR | Status: AC
  Administered 2023-07-24: 4 mg via INTRAVENOUS
  Filled 2023-07-24: qty 2

## 2023-07-24 MED ORDER — ONDANSETRON HCL 4 MG PO TABS: 4.0000 mg | ORAL_TABLET | Freq: Four times a day (QID) | ORAL | 0 refills | Status: DC

## 2023-07-24 MED ORDER — METHYLPREDNISOLONE 4 MG PO TBPK: ORAL_TABLET | Freq: Once | ORAL | 0 refills | Status: AC

## 2023-07-24 MED ORDER — ACETAMINOPHEN 325 MG PO TABS: 650.0000 mg | ORAL_TABLET | Freq: Once | ORAL | Status: DC

## 2023-07-24 MED ORDER — IOHEXOL 300 MG/ML  SOLN
100.0000 mL | Freq: Once | INTRAMUSCULAR | Status: AC | PRN
  Administered 2023-07-24: 80 mL via INTRAVENOUS

## 2023-07-24 NOTE — ED Provider Notes (Signed)
[place] EMERGENCY DEPARTMENT AT [facility] Provider Note   CSN: [id number] Arrival date & time: 07/24/23  1254     History {Add pertinent medical, surgical, social history, OB history to HPI:1} Chief Complaint  Patient presents with   Nausea    Don [name] is a 77 y.o. male.  He has PMH of CHF, GERD, hypertension and gout.  He presents to the ER for nausea and vomiting today, states he was unable to stop throwing up, denies diarrhea or abdominal pain.  No chest pain or shortness of breath, no dizziness or syncope.  States he slipped 2 to 3 days ago trying to get out of bed and fell on his right side having hand pain and swelling since then, denies LOC or syncope or dizziness.  HPI     Home Medications Prior to Admission medications   Medication Sig Start Date End Date Taking? Authorizing Provider  albuterol (VENTOLIN HFA) 108 (90 Base) MCG/ACT inhaler Inhale 1-2 puffs into the lungs every 6 (six) hours as needed for shortness of breath. 04/08/19   [provider]  aspirin EC 81 MG tablet Take 81 mg by mouth daily.    [provider]  carvedilol (COREG) 6.25 MG tablet Take 1 tablet (6.25 mg total) by mouth 2 (two) times daily. 03/26/23   Jonelle Sidle, MD  Cholecalciferol (VITAMIN D) 50 MCG (2000 UT) CAPS Take 2,000 Units by mouth daily.    [provider]  diclofenac (CATAFLAM) 50 MG tablet Take 50 mg by mouth 3 (three) times daily. 05/17/22   [provider]  furosemide (LASIX) 20 MG tablet Take 20 mg daily, Can take extra 20 mg for swelling,weight gain of 2-3 lbs overnight 02/26/23   Dyann Kief, PA-C  meloxicam (MOBIC) 15 MG tablet Take 15 mg by mouth daily as needed. 05/26/22   [provider]  methocarbamol (ROBAXIN) 500 MG tablet Take 1 tablet (500 mg total) by mouth every 12 (twelve) hours as needed for muscle spasms. Patient not taking: Reported on 07/31/2022 02/19/22   Gloris Manchester, MD  omeprazole (PRILOSEC) 20  MG capsule Take 20 mg by mouth daily.    [provider]  pravastatin (PRAVACHOL) 40 MG tablet Take 40 mg by mouth daily.    [provider]  sacubitril-valsartan (ENTRESTO) 24-26 MG Take 1 tablet by mouth 2 (two) times daily. 07/19/23   Jonelle Sidle, MD  spironolactone (ALDACTONE) 25 MG tablet TAKE 1 TABLET BY MOUTH DAILY. 09/22/22   Jonelle Sidle, MD      Allergies    Penicillins    Review of Systems   Review of Systems  Physical Exam Updated Vital Signs BP (!) 158/66   Pulse (!) 50   Temp 97.8 F (36.6 C) (Oral)   Resp 20   SpO2 98%  Physical Exam Vitals and nursing note reviewed.  Constitutional:      General: He is not in acute distress.    Appearance: He is well-developed.  HENT:     Head: Normocephalic and atraumatic.     Mouth/Throat:     Mouth: Mucous membranes are moist.  Eyes:     Extraocular Movements: Extraocular movements intact.     Conjunctiva/sclera: Conjunctivae normal.     Pupils: Pupils are equal, round, and reactive to light.  Cardiovascular:     Rate and Rhythm: Normal rate and regular rhythm.     Heart sounds: No murmur heard. Pulmonary:     Effort:  Pulmonary effort is normal. No respiratory distress.     Breath sounds: Normal breath sounds.  Abdominal:     Palpations: Abdomen is soft.     Tenderness: There is no abdominal tenderness.  Musculoskeletal:        General: No swelling.     Cervical back: Neck supple.  Skin:    General: Skin is warm and dry.     Capillary Refill: Capillary refill takes less than 2 seconds.  Neurological:     General: No focal deficit present.     Mental Status: He is alert and oriented to person, place, and time.  Psychiatric:        Mood and Affect: Mood normal.     ED Results / Procedures / Treatments   Labs (all labs ordered are listed, but only abnormal results are displayed) Labs Reviewed  CBC WITH DIFFERENTIAL/PLATELET  COMPREHENSIVE METABOLIC PANEL  LIPASE, BLOOD   URINALYSIS, ROUTINE W REFLEX MICROSCOPIC    EKG None  Radiology No results found.  Procedures Procedures  {Document cardiac monitor, telemetry assessment procedure when appropriate:1}  Medications Ordered in ED Medications  ondansetron (ZOFRAN) injection 4 mg (has no administration in time range)    ED Course/ Medical Decision Making/ A&P   {   Click here for ABCD2, HEART and other calculatorsREFRESH Note before signing :1}                              Medical Decision Making This patient presents to the ED for concern of nausea and vomiting, also had a fall couple days ago and has a right hand pain, this involves an extensive number of treatment options, and is a complaint that carries with it a high risk of complications and morbidity.  The differential diagnosis includes gastroenteritis, gastritis, pancreatitis, bowel obstruction, PUD, concussion, intracranial hemorrhage, fracture, sprain, other   Co morbidities that complicate the patient evaluation  CHF, gout, hypertension, GERD   Additional history obtained:  Additional history obtained from EMR External records from outside source obtained and reviewed including notes, labs   Lab Tests:  I Ordered, and personally interpreted labs.  The pertinent results include: Patient has baseline renal function, no UTI, lipase is normal, hemoglobin at baseline   Imaging Studies ordered:  I ordered imaging studies including CT head which showed no acute traumatic findings, CT C-spine which showed no acute traumatic findings, x-ray right hand showed no acute traumatic findings and CT abdomen pelvis showed no acute findings I independently visualized and interpreted imaging, interpreted within the scope of finding emergent causes.  This is last I agree with the radiologist interpretation     Problem List / ED Course / Critical interventions / Medication management  *** I ordered medication including ***  for ***   Reevaluation of the patient after these medicines showed that the patient {resolved/improved/worsened:23923::"improved"} I have reviewed the patients home medicines and have made adjustments as needed    Test / Admission - Considered:  ***    Amount and/or Complexity of Data Reviewed Labs: ordered. Radiology: ordered.  Risk Prescription drug management.   ***  {Document critical care time when appropriate:1} {Document review of labs and clinical decision tools ie heart score, Chads2Vasc2 etc:1}  {Document your independent review of radiology images, and any outside records:1} {Document your discussion with family members, caretakers, and with consultants:1} {Document social determinants of health affecting pt's care:1} {Document your decision making why or why  not admission, treatments were needed:1} Final Clinical Impression(s) / ED Diagnoses Final diagnoses:  None    Rx / DC Orders ED Discharge Orders     None

## 2023-07-24 NOTE — ED Notes (Signed)
EMS gave 4mg  of zofran IV.

## 2023-07-24 NOTE — ED Triage Notes (Signed)
BIB [facility], Called out for patient having nausea and vomiting. Patient stated he had a fall yesterday. Right hand swollen from fall. Patient is from home with sister.

## 2023-07-24 NOTE — Discharge Instructions (Addendum)
It was a pleasure taking care of you today.  You were seen for nausea and vomiting.  Your blood work and CT scan were normal.  There were no signs of fracture in your hand where you had fallen.  The swelling may be due to the fall or gout.  Follow-up with your primary care doctor and/or orthopedics.  You can take over-the-counter medication as needed for any discomfort.  You were prescribed steroids for possible gout as this is what your thumb swelling is consistent with.

## 2023-08-02 ENCOUNTER — Inpatient Hospital Stay (HOSPITAL_COMMUNITY)
Admission: EM | Admit: 2023-08-02 | Discharge: 2023-08-04 | DRG: 683 | Disposition: A | Discharge status: Home / Self Care | Payer: 59 | Attending: Internal Medicine | Admitting: Internal Medicine

## 2023-08-02 ENCOUNTER — Encounter (HOSPITAL_COMMUNITY): Payer: Self-pay

## 2023-08-02 ENCOUNTER — Emergency Department (HOSPITAL_COMMUNITY): Payer: 59

## 2023-08-02 ENCOUNTER — Other Ambulatory Visit: Payer: Self-pay

## 2023-08-02 DIAGNOSIS — I10 Essential (primary) hypertension: Secondary | ICD-10-CM | POA: Diagnosis present

## 2023-08-02 DIAGNOSIS — E7800 Pure hypercholesterolemia, unspecified: Secondary | ICD-10-CM | POA: Diagnosis present

## 2023-08-02 DIAGNOSIS — I429 Cardiomyopathy, unspecified: Secondary | ICD-10-CM | POA: Diagnosis present

## 2023-08-02 DIAGNOSIS — Z7982 Long term (current) use of aspirin: Secondary | ICD-10-CM

## 2023-08-02 DIAGNOSIS — I5020 Unspecified systolic (congestive) heart failure: Secondary | ICD-10-CM

## 2023-08-02 DIAGNOSIS — Z79899 Other long term (current) drug therapy: Secondary | ICD-10-CM

## 2023-08-02 DIAGNOSIS — D72829 Elevated white blood cell count, unspecified: Secondary | ICD-10-CM | POA: Insufficient documentation

## 2023-08-02 DIAGNOSIS — Z87891 Personal history of nicotine dependence: Secondary | ICD-10-CM

## 2023-08-02 DIAGNOSIS — I509 Heart failure, unspecified: Secondary | ICD-10-CM

## 2023-08-02 DIAGNOSIS — Z8249 Family history of ischemic heart disease and other diseases of the circulatory system: Secondary | ICD-10-CM

## 2023-08-02 DIAGNOSIS — R296 Repeated falls: Secondary | ICD-10-CM | POA: Insufficient documentation

## 2023-08-02 DIAGNOSIS — I5042 Chronic combined systolic (congestive) and diastolic (congestive) heart failure: Secondary | ICD-10-CM | POA: Diagnosis present

## 2023-08-02 DIAGNOSIS — M79641 Pain in right hand: Secondary | ICD-10-CM

## 2023-08-02 DIAGNOSIS — N179 Acute kidney failure, unspecified: Secondary | ICD-10-CM | POA: Diagnosis not present

## 2023-08-02 DIAGNOSIS — Z1152 Encounter for screening for COVID-19: Secondary | ICD-10-CM

## 2023-08-02 DIAGNOSIS — R0789 Other chest pain: Secondary | ICD-10-CM | POA: Diagnosis not present

## 2023-08-02 DIAGNOSIS — K219 Gastro-esophageal reflux disease without esophagitis: Secondary | ICD-10-CM | POA: Diagnosis present

## 2023-08-02 DIAGNOSIS — Z8673 Personal history of transient ischemic attack (TIA), and cerebral infarction without residual deficits: Secondary | ICD-10-CM

## 2023-08-02 DIAGNOSIS — M109 Gout, unspecified: Secondary | ICD-10-CM | POA: Diagnosis present

## 2023-08-02 DIAGNOSIS — N1832 Chronic kidney disease, stage 3b: Secondary | ICD-10-CM | POA: Diagnosis present

## 2023-08-02 DIAGNOSIS — I130 Hypertensive heart and chronic kidney disease with heart failure and stage 1 through stage 4 chronic kidney disease, or unspecified chronic kidney disease: Secondary | ICD-10-CM | POA: Diagnosis present

## 2023-08-02 DIAGNOSIS — K7460 Unspecified cirrhosis of liver: Secondary | ICD-10-CM | POA: Diagnosis present

## 2023-08-02 DIAGNOSIS — I679 Cerebrovascular disease, unspecified: Secondary | ICD-10-CM | POA: Diagnosis present

## 2023-08-02 DIAGNOSIS — I5032 Chronic diastolic (congestive) heart failure: Secondary | ICD-10-CM

## 2023-08-02 DIAGNOSIS — E860 Dehydration: Secondary | ICD-10-CM | POA: Diagnosis present

## 2023-08-02 LAB — TROPONIN I (HIGH SENSITIVITY)
Troponin I (High Sensitivity): 21 ng/L — ABNORMAL HIGH (ref <18)
Troponin I (High Sensitivity): 18 ng/L — ABNORMAL HIGH (ref <18)

## 2023-08-02 LAB — CBC
HCT: 28.4 % — ABNORMAL LOW (ref 39.0–52.0)
Hemoglobin: 9.9 g/dL — ABNORMAL LOW (ref 13.0–17.0)
MCH: 33.1 pg (ref 26.0–34.0)
MCHC: 34.9 g/dL (ref 30.0–36.0)
MCV: 95 fL (ref 80.0–100.0)
Platelets: 264 10*3/uL (ref 150–400)
RBC: 2.99 MIL/uL — ABNORMAL LOW (ref 4.22–5.81)
RDW: 12.2 % (ref 11.5–15.5)
WBC: 12.3 10*3/uL — ABNORMAL HIGH (ref 4.0–10.5)
nRBC: 0 % (ref 0.0–0.2)

## 2023-08-02 LAB — BASIC METABOLIC PANEL
Anion gap: 11 (ref 5–15)
BUN: 51 mg/dL — ABNORMAL HIGH (ref 8–23)
CO2: 23 mmol/L (ref 22–32)
Calcium: 8.5 mg/dL — ABNORMAL LOW (ref 8.9–10.3)
Chloride: 99 mmol/L (ref 98–111)
Creatinine, Ser: 2.97 mg/dL — ABNORMAL HIGH (ref 0.61–1.24)
GFR, Estimated: 21 mL/min — ABNORMAL LOW (ref >60)
Glucose, Bld: 109 mg/dL — ABNORMAL HIGH (ref 70–99)
Potassium: 3.9 mmol/L (ref 3.5–5.1)
Sodium: 133 mmol/L — ABNORMAL LOW (ref 135–145)

## 2023-08-02 MED ORDER — ONDANSETRON HCL 4 MG/2ML IJ SOLN
4.0000 mg | Freq: Once | INTRAMUSCULAR | Status: AC
  Administered 2023-08-02: 4 mg via INTRAVENOUS
  Filled 2023-08-02: qty 2

## 2023-08-02 MED ORDER — SODIUM CHLORIDE 0.9 % IV BOLUS
1000.0000 mL | Freq: Once | INTRAVENOUS | Status: AC
Start: 2023-08-02 — End: 2023-08-03
  Administered 2023-08-02: 1000 mL via INTRAVENOUS

## 2023-08-02 NOTE — ED Triage Notes (Addendum)
BIB [facility] for chest pain for 2-3 days, right hand swollen from fall couple days ago, pt says he lives with sister and nephew, EMS reports picking pt up as he was walking from store back to his house. Pt says chest pain pain has improved, hurts "just a little now" pt also c/o not being able to sleep due to right arm/hand pain

## 2023-08-03 DIAGNOSIS — K7460 Unspecified cirrhosis of liver: Secondary | ICD-10-CM | POA: Diagnosis present

## 2023-08-03 DIAGNOSIS — R296 Repeated falls: Secondary | ICD-10-CM

## 2023-08-03 DIAGNOSIS — I5042 Chronic combined systolic (congestive) and diastolic (congestive) heart failure: Secondary | ICD-10-CM | POA: Diagnosis present

## 2023-08-03 DIAGNOSIS — Z8249 Family history of ischemic heart disease and other diseases of the circulatory system: Secondary | ICD-10-CM | POA: Diagnosis not present

## 2023-08-03 DIAGNOSIS — I5023 Acute on chronic systolic (congestive) heart failure: Secondary | ICD-10-CM

## 2023-08-03 DIAGNOSIS — E7800 Pure hypercholesterolemia, unspecified: Secondary | ICD-10-CM

## 2023-08-03 DIAGNOSIS — I10 Essential (primary) hypertension: Secondary | ICD-10-CM

## 2023-08-03 DIAGNOSIS — R0789 Other chest pain: Secondary | ICD-10-CM

## 2023-08-03 DIAGNOSIS — I5022 Chronic systolic (congestive) heart failure: Secondary | ICD-10-CM

## 2023-08-03 DIAGNOSIS — Z7982 Long term (current) use of aspirin: Secondary | ICD-10-CM | POA: Diagnosis not present

## 2023-08-03 DIAGNOSIS — I130 Hypertensive heart and chronic kidney disease with heart failure and stage 1 through stage 4 chronic kidney disease, or unspecified chronic kidney disease: Secondary | ICD-10-CM | POA: Diagnosis present

## 2023-08-03 DIAGNOSIS — K219 Gastro-esophageal reflux disease without esophagitis: Secondary | ICD-10-CM | POA: Diagnosis present

## 2023-08-03 DIAGNOSIS — Z1152 Encounter for screening for COVID-19: Secondary | ICD-10-CM | POA: Diagnosis not present

## 2023-08-03 DIAGNOSIS — Z87891 Personal history of nicotine dependence: Secondary | ICD-10-CM | POA: Diagnosis not present

## 2023-08-03 DIAGNOSIS — N179 Acute kidney failure, unspecified: Secondary | ICD-10-CM | POA: Diagnosis present

## 2023-08-03 DIAGNOSIS — M109 Gout, unspecified: Secondary | ICD-10-CM | POA: Diagnosis present

## 2023-08-03 DIAGNOSIS — I429 Cardiomyopathy, unspecified: Secondary | ICD-10-CM | POA: Diagnosis present

## 2023-08-03 DIAGNOSIS — D72829 Elevated white blood cell count, unspecified: Secondary | ICD-10-CM | POA: Insufficient documentation

## 2023-08-03 DIAGNOSIS — Z79899 Other long term (current) drug therapy: Secondary | ICD-10-CM | POA: Diagnosis not present

## 2023-08-03 DIAGNOSIS — I679 Cerebrovascular disease, unspecified: Secondary | ICD-10-CM | POA: Diagnosis present

## 2023-08-03 DIAGNOSIS — Z8673 Personal history of transient ischemic attack (TIA), and cerebral infarction without residual deficits: Secondary | ICD-10-CM | POA: Diagnosis not present

## 2023-08-03 DIAGNOSIS — E860 Dehydration: Secondary | ICD-10-CM | POA: Diagnosis present

## 2023-08-03 DIAGNOSIS — N1832 Chronic kidney disease, stage 3b: Secondary | ICD-10-CM | POA: Diagnosis present

## 2023-08-03 LAB — CBC WITH DIFFERENTIAL/PLATELET
Abs Immature Granulocytes: 0.09 10*3/uL — ABNORMAL HIGH (ref 0.00–0.07)
Basophils Absolute: 0 10*3/uL (ref 0.0–0.1)
Basophils Relative: 0 %
Eosinophils Absolute: 0.2 10*3/uL (ref 0.0–0.5)
Eosinophils Relative: 2 %
HCT: 30.8 % — ABNORMAL LOW (ref 39.0–52.0)
Hemoglobin: 10.4 g/dL — ABNORMAL LOW (ref 13.0–17.0)
Immature Granulocytes: 1 %
Lymphocytes Relative: 9 %
Lymphs Abs: 1.1 10*3/uL (ref 0.7–4.0)
MCH: 31.9 pg (ref 26.0–34.0)
MCHC: 33.8 g/dL (ref 30.0–36.0)
MCV: 94.5 fL (ref 80.0–100.0)
Monocytes Absolute: 0.8 10*3/uL (ref 0.1–1.0)
Monocytes Relative: 7 %
Neutro Abs: 9.2 10*3/uL — ABNORMAL HIGH (ref 1.7–7.7)
Neutrophils Relative %: 81 %
Platelets: 269 10*3/uL (ref 150–400)
RBC: 3.26 MIL/uL — ABNORMAL LOW (ref 4.22–5.81)
RDW: 12 % (ref 11.5–15.5)
WBC: 11.4 10*3/uL — ABNORMAL HIGH (ref 4.0–10.5)
nRBC: 0 % (ref 0.0–0.2)

## 2023-08-03 LAB — URINALYSIS, ROUTINE W REFLEX MICROSCOPIC
Bacteria, UA: NONE SEEN
Bilirubin Urine: NEGATIVE
Glucose, UA: NEGATIVE mg/dL
Ketones, ur: NEGATIVE mg/dL
Leukocytes,Ua: NEGATIVE
Nitrite: NEGATIVE
Protein, ur: NEGATIVE mg/dL
Specific Gravity, Urine: 1.013 (ref 1.005–1.030)
pH: 5 (ref 5.0–8.0)

## 2023-08-03 LAB — COMPREHENSIVE METABOLIC PANEL
ALT: 15 U/L (ref 0–44)
AST: 16 U/L (ref 15–41)
Albumin: 2.6 g/dL — ABNORMAL LOW (ref 3.5–5.0)
Alkaline Phosphatase: 51 U/L (ref 38–126)
Anion gap: 7 (ref 5–15)
BUN: 43 mg/dL — ABNORMAL HIGH (ref 8–23)
CO2: 24 mmol/L (ref 22–32)
Calcium: 8.5 mg/dL — ABNORMAL LOW (ref 8.9–10.3)
Chloride: 103 mmol/L (ref 98–111)
Creatinine, Ser: 2.13 mg/dL — ABNORMAL HIGH (ref 0.61–1.24)
GFR, Estimated: 31 mL/min — ABNORMAL LOW (ref >60)
Glucose, Bld: 101 mg/dL — ABNORMAL HIGH (ref 70–99)
Potassium: 4 mmol/L (ref 3.5–5.1)
Sodium: 134 mmol/L — ABNORMAL LOW (ref 135–145)
Total Bilirubin: 1.1 mg/dL (ref <1.2)
Total Protein: 6.1 g/dL — ABNORMAL LOW (ref 6.5–8.1)

## 2023-08-03 LAB — MAGNESIUM: Magnesium: 2 mg/dL (ref 1.7–2.4)

## 2023-08-03 LAB — PROCALCITONIN: Procalcitonin: <0.1 ng/mL

## 2023-08-03 MED ORDER — ACETAMINOPHEN 650 MG RE SUPP: 650.0000 mg | Freq: Four times a day (QID) | RECTAL | Status: DC | PRN

## 2023-08-03 MED ORDER — PANTOPRAZOLE SODIUM 40 MG PO TBEC
40.0000 mg | DELAYED_RELEASE_TABLET | Freq: Every day | ORAL | Status: DC
  Administered 2023-08-03 – 2023-08-04 (×2): 40 mg via ORAL
  Filled 2023-08-03 (×2): qty 1

## 2023-08-03 MED ORDER — CALCITRIOL 0.25 MCG PO CAPS
0.2500 ug | ORAL_CAPSULE | Freq: Every day | ORAL | Status: DC
  Administered 2023-08-03 – 2023-08-04 (×2): 0.25 ug via ORAL
  Filled 2023-08-03 (×2): qty 1

## 2023-08-03 MED ORDER — ONDANSETRON HCL 4 MG/2ML IJ SOLN: 4.0000 mg | Freq: Four times a day (QID) | INTRAMUSCULAR | Status: DC | PRN

## 2023-08-03 MED ORDER — PRAVASTATIN SODIUM 40 MG PO TABS
40.0000 mg | ORAL_TABLET | Freq: Every day | ORAL | Status: DC
  Administered 2023-08-03 – 2023-08-04 (×2): 40 mg via ORAL
  Filled 2023-08-03 (×2): qty 1

## 2023-08-03 MED ORDER — CARVEDILOL 3.125 MG PO TABS
6.2500 mg | ORAL_TABLET | Freq: Two times a day (BID) | ORAL | Status: DC
  Administered 2023-08-03 – 2023-08-04 (×3): 6.25 mg via ORAL
  Filled 2023-08-03 (×4): qty 2

## 2023-08-03 MED ORDER — ONDANSETRON HCL 4 MG PO TABS: 4.0000 mg | ORAL_TABLET | Freq: Four times a day (QID) | ORAL | Status: DC | PRN

## 2023-08-03 MED ORDER — SODIUM CHLORIDE 0.9 % IV SOLN
INTRAVENOUS | Status: AC
Start: 2023-08-03 — End: 2023-08-04

## 2023-08-03 MED ORDER — ASPIRIN 81 MG PO TBEC
81.0000 mg | DELAYED_RELEASE_TABLET | Freq: Every day | ORAL | Status: DC
  Administered 2023-08-03 – 2023-08-04 (×2): 81 mg via ORAL
  Filled 2023-08-03 (×2): qty 1

## 2023-08-03 MED ORDER — OXYCODONE HCL 5 MG PO TABS: 5.0000 mg | ORAL_TABLET | ORAL | Status: DC | PRN

## 2023-08-03 MED ORDER — ACETAMINOPHEN 325 MG PO TABS: 650.0000 mg | ORAL_TABLET | Freq: Four times a day (QID) | ORAL | Status: DC | PRN

## 2023-08-03 MED ORDER — HEPARIN SODIUM (PORCINE) 5000 UNIT/ML IJ SOLN
5000.0000 [IU] | Freq: Three times a day (TID) | INTRAMUSCULAR | Status: DC
  Administered 2023-08-03 – 2023-08-04 (×4): 5000 [IU] via SUBCUTANEOUS
  Filled 2023-08-03 (×5): qty 1

## 2023-08-03 NOTE — Assessment & Plan Note (Signed)
-   Holding Entresto, spironolactone in the setting of AKI - Continue Coreg - Continue to monitor

## 2023-08-03 NOTE — Assessment & Plan Note (Signed)
Reports more frequent falls at home - PT eval and treat

## 2023-08-03 NOTE — Assessment & Plan Note (Signed)
-   Continue aspirin, Coreg, pravastatin - Holding Entresto and spironolactone in the setting of AKI

## 2023-08-03 NOTE — ED Provider Notes (Signed)
[place] EMERGENCY DEPARTMENT AT [facility] Provider Note   CSN: [id number] Arrival date & time: 08/02/23  2012     History  Chief Complaint  Patient presents with   Chest Pain    X 2-3 days    Don [name] is a 77 y.o. male.  Pt is a 77 yo male with pmhx significant for cva, hld, cm, chf, cirrhosis, ckd, hepatitis c, and gout.  Pt said he's been falling multiple times and hurt his right hand.  He also has some cp and some vomiting.  Pt said cp is worse when he exerts himself.         Home Medications Prior to Admission medications   Medication Sig Start Date End Date Taking? Authorizing Provider  aspirin EC 81 MG tablet Take 81 mg by mouth daily.   Yes [provider]  calcitRIOL (ROCALTROL) 0.25 MCG capsule Take 0.25 mcg by mouth daily.   Yes [provider]  carvedilol (COREG) 6.25 MG tablet Take 1 tablet (6.25 mg total) by mouth 2 (two) times daily. 03/26/23  Yes Jonelle Sidle, MD  Cholecalciferol (VITAMIN D) 50 MCG (2000 UT) CAPS Take 2,000 Units by mouth daily.   Yes [provider]  furosemide (LASIX) 20 MG tablet Take 20 mg daily, Can take extra 20 mg for swelling,weight gain of 2-3 lbs overnight 02/26/23  Yes Dyann Kief, PA-C  omeprazole (PRILOSEC) 20 MG capsule Take 20 mg by mouth daily.   Yes [provider]  ondansetron (ZOFRAN) 4 MG tablet Take 1 tablet (4 mg total) by mouth every 6 (six) hours. 07/24/23  Yes Beatty, Celeste A, PA-C  pravastatin (PRAVACHOL) 40 MG tablet Take 40 mg by mouth daily.   Yes [provider]  sacubitril-valsartan (ENTRESTO) 24-26 MG Take 1 tablet by mouth 2 (two) times daily. 07/19/23  Yes Jonelle Sidle, MD  spironolactone (ALDACTONE) 25 MG tablet TAKE 1 TABLET BY MOUTH DAILY. 09/22/22  Yes Jonelle Sidle, MD      Allergies    Penicillins    Review of Systems   Review of Systems  Cardiovascular:  Positive for chest pain.  Musculoskeletal:        Right hand  pain  All other systems reviewed and are negative.   Physical Exam Updated Vital Signs BP 121/74   Pulse 60   Temp 98.4 F (36.9 C) (Oral)   Resp 18   Ht 5\' 6"  (1.676 m)   Wt 78 kg   SpO2 97%   BMI 27.75 kg/m  Physical Exam Vitals and nursing note reviewed.  Constitutional:      Appearance: He is well-developed.  HENT:     Head: Normocephalic and atraumatic.     Mouth/Throat:     Mouth: Mucous membranes are dry.  Eyes:     Extraocular Movements: Extraocular movements intact.     Pupils: Pupils are equal, round, and reactive to light.  Cardiovascular:     Rate and Rhythm: Normal rate and regular rhythm.     Heart sounds: Normal heart sounds.  Pulmonary:     Effort: Pulmonary effort is normal.     Breath sounds: Normal breath sounds.  Abdominal:     General: Bowel sounds are normal.     Palpations: Abdomen is soft.  Musculoskeletal:     Cervical back: Normal range of motion and neck supple.     Comments: Right hand swollen and painful with movement; 2 rings removed  Skin:  General: Skin is warm.     Capillary Refill: Capillary refill takes less than 2 seconds.  Neurological:     General: No focal deficit present.     Mental Status: He is alert and oriented to person, place, and time.  Psychiatric:        Mood and Affect: Mood normal.        Behavior: Behavior normal.     ED Results / Procedures / Treatments   Labs (all labs ordered are listed, but only abnormal results are displayed) Labs Reviewed  BASIC METABOLIC PANEL - Abnormal; Notable for the following components:      Result Value   Sodium 133 (*)    Glucose, Bld 109 (*)    BUN 51 (*)    Creatinine, Ser 2.97 (*)    Calcium 8.5 (*)    GFR, Estimated 21 (*)    All other components within normal limits  CBC - Abnormal; Notable for the following components:   WBC 12.3 (*)    RBC 2.99 (*)    Hemoglobin 9.9 (*)    HCT 28.4 (*)    All other components within normal limits  TROPONIN I (HIGH  SENSITIVITY) - Abnormal; Notable for the following components:   Troponin I (High Sensitivity) 21 (*)    All other components within normal limits  TROPONIN I (HIGH SENSITIVITY) - Abnormal; Notable for the following components:   Troponin I (High Sensitivity) 18 (*)    All other components within normal limits  PROCALCITONIN    EKG EKG Interpretation Date/Time:  Thursday August 02 2023 20:27:19 EST Ventricular Rate:  68 PR Interval:  169 QRS Duration:  107 QT Interval:  390 QTC Calculation: 415 R Axis:   34  Text Interpretation: Sinus rhythm Supraventricular bigeminy Borderline T abnormalities, inferior leads No significant change since last tracing Confirmed by Jacalyn Lefevre [phone]) on 08/02/2023 9:47:28 PM  Radiology DG Hand Complete Right  Result Date: 08/02/2023 CLINICAL DATA:  Right hand pain and swelling after falling in the tub. EXAM: RIGHT HAND - COMPLETE 3+ VIEW COMPARISON:  None Available. FINDINGS: Assessment of the digits is limited due to positioning. Allowing for this, no evidence of acute fracture. Prominent osteoarthritis of the thumb, second and third metacarpal phalangeal joints. Scattered degenerative carpal bone cysts. Chondrocalcinosis of the triangular fibrocartilage. Vascular calcifications. There is dorsal soft tissue edema overlying the metacarpals. IMPRESSION: 1. Dorsal soft tissue edema. No acute fracture. 2. Osteoarthritis of the thumb, second and third metacarpophalangeal joints. Electronically Signed   By: Narda Rutherford M.D.   On: 08/02/2023 22:19   DG Chest 2 View  Result Date: 08/02/2023 CLINICAL DATA:  Chest pain. EXAM: CHEST - 2 VIEW COMPARISON:  Radiograph 07/19/2022 FINDINGS: Elevated right hemidiaphragm. Trace fluid in the fissures without large subpulmonic effusion. Right perihilar atelectasis. No pneumothorax. No confluent consolidation. No acute osseous abnormalities. IMPRESSION: 1. Elevated right hemidiaphragm with right perihilar  atelectasis. 2. Trace fluid in the fissures. Electronically Signed   By: Narda Rutherford M.D.   On: 08/02/2023 22:18    Procedures Procedures    Medications Ordered in ED Medications  sodium chloride 0.9 % bolus 1,000 mL (1,000 mLs Intravenous New Bag/Given 08/02/23 2227)  ondansetron (ZOFRAN) injection 4 mg (4 mg Intravenous Given 08/02/23 2225)    ED Course/ Medical Decision Making/ A&P  Medical Decision Making Amount and/or Complexity of Data Reviewed Labs: ordered. Radiology: ordered.  Risk Prescription drug management. Decision regarding hospitalization.   This patient presents to the ED for concern of cp and weakness, this involves an extensive number of treatment options, and is a complaint that carries with it a high risk of complications and morbidity.  The differential diagnosis includes cardiac, pulm, gi   Co morbidities that complicate the patient evaluation   cva, hld, cm, chf, cirrhosis, ckd, hepatitis c, and gout.   Additional history obtained:  Additional history obtained from epic chart review External records from outside source obtained and reviewed including EMS report   Lab Tests:  I Ordered, and personally interpreted labs.  The pertinent results include:  cbc with hgb 9.9 (hg 11.4 on 10/2); bmp with bun 51 and cr 2.97 (cr 1.5 10 days ago), trop 18   Imaging Studies ordered:  I ordered imaging studies including cxr and right hand  I independently visualized and interpreted imaging which showed  CXR: Elevated right hemidiaphragm with right perihilar atelectasis.  2. Trace fluid in the fissures.  R hand:  Dorsal soft tissue edema. No acute fracture.  2. Osteoarthritis of the thumb, second and third metacarpophalangeal  joints.   I agree with the radiologist interpretation   Cardiac Monitoring:  The patient was maintained on a cardiac monitor.  I personally viewed and interpreted the cardiac monitored which  showed an underlying rhythm of: nsr   Medicines ordered and prescription drug management:  I ordered medication including ivfs/zofran  for sx  Reevaluation of the patient after these medicines showed that the patient improved I have reviewed the patients home medicines and have made adjustments as needed   Critical Interventions:  ivfs   Consultations Obtained:  I requested consultation with the hospitalist (Dr. Carren Rang),  and discussed lab and imaging findings as well as pertinent plan - she will admit   Problem List / ED Course:  CP:  atypical.  Trop ok.  Cp gone now AKI:  likely from n/v R hand pain:  neg for fx.  Rings removed.   Reevaluation:  After the interventions noted above, I reevaluated the patient and found that they have :improved   Social Determinants of Health:  Lives at home   Dispostion:  After consideration of the diagnostic results and the patients response to treatment, I feel that the patent would benefit from admission.          Final Clinical Impression(s) / ED Diagnoses Final diagnoses:  AKI (acute kidney injury) (HCC)  Atypical chest pain  Right hand pain    Rx / DC Orders ED Discharge Orders     None         Jacalyn Lefevre, MD 08/03/23 [phone]

## 2023-08-03 NOTE — Progress Notes (Signed)
Patient seen and examined; admitted after midnight secondary to chest pain and acute kidney injury.  Patient with history of CHF (chronic systolic with ejection fraction 20 to 25%).  Patient has been experiencing pain for 2 to 3 days prior to admission without any associated shortness of breath, productive cough or fever.  Troponin negative and EKG demonstrating no acute ischemic changes.  Patient found to be slightly dehydrated with acute kidney injury component and TRH contacted to assist with fluid resuscitation and close monitoring of his renal function.  At time of my examination patient was not complaining of chest pain.  Please refer to H&P written by Dr.Zierle-Ghosh for further info/details on admission.  Plan: -Continue judicious fluid resuscitation -Follow daily weights, strict I's and O's and low-sodium diet. -Repeat basic metabolic panel in a.m. -Minimize nephrotoxic agents, avoid the use of contrast and avoid hypotension. -Patient just discomfort most likely associated with GERD; will resume PPI.  Lifestyle changes discussed with patient. -follow clinical response.  Vassie Loll MD [phone]

## 2023-08-03 NOTE — Assessment & Plan Note (Signed)
-   Holding PPI in the setting of AKI 

## 2023-08-03 NOTE — Evaluation (Signed)
Physical Therapy Evaluation Patient Details Name: Don [name] MRN: [id number] DOB: 12-11-1945 Today's Date: 08/03/2023  History of Present Illness  Don [name] is a 77 y.o. male with medical history significant of cardiomyopathy, cerebrovascular disease, history of CHF, CKD, cirrhosis, hypertension, hyperlipidemia, history of hepatitis, and more presents to the ED with a chief complaint of chest pain.  Patient reports that the chest pain started day before yesterday.  Nothing made it worse and nothing made it better.  He describes it as sharp.  He has no associated shortness of breath.  He did have some nausea and reports a decreased appetite.  He is not sure how long the decreased appetite has been going on but he says "not long."  Patient reports he has had some weight loss, but he is not sure how much weight loss.  He denies any abdominal pain or dysuria.  From a cardiac standpoint, patient workup was pretty good.  He is being admitted because he had an AKI.  Patient is overall poor historian when asking the details of his presentation to the hospital today.  Clinical Impression  PT main complaint is that of his RT hand.  Therapist attempted to do fisting exercises but pt was not receptive.   Therapist explained that gentle motion will most likely decrease his pain, pt was not receptive.  Pt is mod I for bed mobility, able to stand after 3 attempts.  Ambulated slightly unsteady with no assistive device.  Improved  stability with a RW        If plan is discharge home, recommend the following: Help with stairs or ramp for entrance;Assist for transportation;Assistance with cooking/housework;A little help with bathing/dressing/bathroom   Can travel by private vehicle    yes    Equipment Recommendations Rolling walker (2 wheels)  Recommendations for Other Services  OT consult    Functional Status Assessment Patient has had a recent decline in their functional status and demonstrates the  ability to make significant improvements in function in a reasonable and predictable amount of time.     Precautions / Restrictions Precautions Precautions: Fall Restrictions Weight Bearing Restrictions: No      Mobility  Bed Mobility Overal bed mobility: Modified Independent                  Transfers Overall transfer level: Needs assistance Equipment used: Rolling walker (2 wheels) Transfers: Sit to/from Stand Sit to Stand: Supervision                Ambulation/Gait Ambulation/Gait assistance: Modified independent (Device/Increase time) Gait Distance (Feet): 70 Feet Assistive device: Rolling walker (2 wheels) Gait Pattern/deviations: Trunk flexed, Decreased step length - left, Decreased stance time - right   Gait velocity interpretation: 1.31 - 2.62 ft/sec, indicative of limited community ambulator           Pertinent Vitals/Pain Pain Assessment Pain Assessment: Faces Faces Pain Scale: Hurts even more Pain Location: Right hand, therapist attempts to get pt to open his hand but he will not due to pain Pain Descriptors / Indicators: Aching Pain Intervention(s): Limited activity within patient's tolerance    Home Living Family/patient expects to be discharged to:: Private residence Living Arrangements: Other relatives Available Help at Discharge: Available PRN/intermittently;Family Type of Home: House Home Access: Stairs to enter   [employer] of Steps: 4   Home Layout: Bed/bath upstairs Home Equipment: Gilmer Mor - single point      Prior Function Prior Level of Function : Independent/Modified Independent  Extremity/Trunk Assessment        Lower Extremity Assessment Lower Extremity Assessment: Generalized weakness       Communication   Communication Communication: No apparent difficulties Cueing Techniques: Verbal cues  Cognition Arousal: Alert Behavior During Therapy: WFL for tasks  assessed/performed Overall Cognitive Status: Within Functional Limits for tasks assessed                                                 Assessment/Plan    PT Assessment Patient needs continued PT services  PT Problem List Decreased strength;Decreased activity tolerance;Decreased balance;Pain       PT Treatment Interventions Therapeutic activities;Therapeutic exercise;Balance training    PT Goals (Current goals can be found in the Care Plan section)  Acute Rehab PT Goals Patient Stated Goal: To go home PT Goal Formulation: With patient Time For Goal Achievement: 08/06/23 Potential to Achieve Goals: Good    Frequency Min 2X/week        AM-PAC PT "6 Clicks" Mobility  Outcome Measure Help needed turning from your back to your side while in a flat bed without using bedrails?: None Help needed moving from lying on your back to sitting on the side of a flat bed without using bedrails?: None Help needed moving to and from a bed to a chair (including a wheelchair)?: A Little Help needed standing up from a chair using your arms (e.g., wheelchair or bedside chair)?: A Little Help needed to walk in hospital room?: A Little Help needed climbing 3-5 steps with a railing? : A Lot 6 Click Score: 19    End of Session Equipment Utilized During Treatment: Gait belt Activity Tolerance: Patient tolerated treatment well Patient left: in bed;with call bell/phone within reach   PT Visit Diagnosis: Unsteadiness on feet (R26.81)    Time: 1525-1540 PT Time Calculation (min) (ACUTE ONLY): 15 min   Charges:   PT Evaluation $PT Eval Low Complexity: 1 Low   PT General Charges $$ ACUTE PT VISIT: 1 Visit        Virgina Organ, PT CLT [phone]  08/03/2023, 3:55 PM

## 2023-08-03 NOTE — Assessment & Plan Note (Signed)
-   Creatinine at baseline 1.5, creatinine today nearly 3 - Patient reports no appetite and decreased p.o. intake at home - 1 L normal saline given in the ED - Continue maintenance fluids - Continue to monitor

## 2023-08-03 NOTE — Assessment & Plan Note (Signed)
Continue statin. 

## 2023-08-03 NOTE — H&P (Signed)
History and Physical    PatientJALENE Don [name] ZOX:[id number] DOB: 27-Sep-1945 DOA: 08/02/2023 DOS: the patient was seen and examined on 08/03/2023 PCP: [facility]  Patient coming from: Home  Chief Complaint:  Chief Complaint  Patient presents with   Chest Pain    X 2-3 days   HPI: Don Don [name] is a 77 y.o. male with medical history significant of cardiomyopathy, cerebrovascular disease, history of CHF, CKD, cirrhosis, hypertension, hyperlipidemia, history of hepatitis, and more presents to the ED with a chief complaint of chest pain.  Patient reports that the chest pain started day before yesterday.  Nothing made it worse and nothing made it better.  He describes it as sharp.  He has no associated shortness of breath.  He did have some nausea and reports a decreased appetite.  He is not sure how long the decreased appetite has been going on but he says "not long."  Patient reports he has had some weight loss, but he is not sure how much weight loss.  He denies any abdominal pain or dysuria.  From a cardiac standpoint, patient workup was pretty good.  He is being admitted because he had an AKI.  Patient is overall poor historian when asking the details of his presentation to the hospital today.  Patient no longer smokes.  He reports he does not drink anymore because drinking causes him to have green nausea and vomiting.  Patient is full code. Review of Systems: As mentioned in the history of present illness. All other systems reviewed and are negative. Past Medical History:  Diagnosis Date   Aortic atherosclerosis (HCC)    Cardiomyopathy (HCC)    a. EF 20 to 25% by echo in 03/2019.   Cerebrovascular disease    Chronic HFrEF (heart failure with reduced ejection fraction) (HCC)    Chronic kidney disease, stage 3a (HCC)    Cirrhosis (HCC)    Essential hypertension    Gout    Hepatitis C    Hyperlipidemia    Past Surgical History:  Procedure Laterality Date   HERNIA  REPAIR Left    Social History:  reports that he quit smoking about 14 years ago. His smoking use included cigarettes. He has never used smokeless tobacco. He reports that he does not currently use drugs. He reports that he does not drink alcohol.  Allergies  Allergen Reactions   Penicillins Swelling    Family History  Problem Relation Age of Onset   Hypertension Sister     Prior to Admission medications   Medication Sig Start Date End Date Taking? Authorizing Provider  aspirin EC 81 MG tablet Take 81 mg by mouth daily.   Yes [provider]  calcitRIOL (ROCALTROL) 0.25 MCG capsule Take 0.25 mcg by mouth daily.   Yes [provider]  carvedilol (COREG) 6.25 MG tablet Take 1 tablet (6.25 mg total) by mouth 2 (two) times daily. 03/26/23  Yes Jonelle Sidle, MD  Cholecalciferol (VITAMIN D) 50 MCG (2000 UT) CAPS Take 2,000 Units by mouth daily.   Yes [provider]  furosemide (LASIX) 20 MG tablet Take 20 mg daily, Can take extra 20 mg for swelling,weight gain of 2-3 lbs overnight 02/26/23  Yes Dyann Kief, PA-C  omeprazole (PRILOSEC) 20 MG capsule Take 20 mg by mouth daily.   Yes [provider]  ondansetron (ZOFRAN) 4 MG tablet Take 1 tablet (4 mg total) by mouth every 6 (six) hours. 07/24/23  Yes Carmel Sacramento  A, PA-C  pravastatin (PRAVACHOL) 40 MG tablet Take 40 mg by mouth daily.   Yes [provider]  sacubitril-valsartan (ENTRESTO) 24-26 MG Take 1 tablet by mouth 2 (two) times daily. 07/19/23  Yes Jonelle Sidle, MD  spironolactone (ALDACTONE) 25 MG tablet TAKE 1 TABLET BY MOUTH DAILY. 09/22/22  Yes Jonelle Sidle, MD    Physical Exam: Vitals:   08/02/23 2330 08/03/23 0030 08/03/23 0215 08/03/23 0500  BP: 121/74 120/73 120/61 126/61  Pulse: 60 61 60 (!) 57  Resp: 18 16 17 14   Temp:      TempSrc:      SpO2: 97% 95% 97% 96%  Weight:      Height:       1.  General: Patient lying supine in bed,  no acute distress    2. Psychiatric: Alert and oriented x 3, mood and behavior normal for situation, pleasant and cooperative with exam   3. Neurologic: Speech and language are normal, face is symmetric, moves all 4 extremities voluntarily, at baseline without acute deficits on limited exam   4. HEENMT:  Head is atraumatic, normocephalic, pupils reactive to light, neck is supple, trachea is midline, mucous membranes are moist   5. Respiratory : Lungs are clear to auscultation bilaterally without wheezing, rhonchi, rales, no cyanosis, no increase in work of breathing or accessory muscle use   6. Cardiovascular : Heart rate normal, rhythm is irregular, no murmurs, rubs or gallops, no peripheral edema, peripheral pulses palpated   7. Gastrointestinal:  Abdomen is soft, nondistended, nontender to palpation bowel sounds active, no masses or organomegaly palpated   8. Skin:  Skin is warm, dry and intact without rashes, acute lesions, or ulcers on limited exam   9.Musculoskeletal:  No acute deformities or trauma, no asymmetry in tone, no peripheral edema, peripheral pulses palpated, no tenderness to palpation in the extremities  Data Reviewed: In the ED Patient is afebrile, heart rate normal, respiratory rate normal, blood pressure normal Leukocytosis of 12.3 but procalcitonin is undetectable Chemistry reveals an elevated creatinine at 2.97 Troponin is first 21 and then 18 Chest x-ray shows elevated right hemidiaphragm with right perihilar atelectasis 1 L normal saline given in the ED Zofran given in the ED   Assessment and Plan: * AKI (acute kidney injury) (HCC) - Creatinine at baseline 1.5, creatinine today nearly 3 - Patient reports no appetite and decreased p.o. intake at home - 1 L normal saline given in the ED - Continue maintenance fluids - Continue to monitor  Falls Reports more frequent falls at home - PT eval and treat  Leukocytosis No infectious symptoms - Procalcitonin is  undetectable - UA is not indicative of UTI - Chest x-ray shows no evidence of consolidation - Continue to monitor  Hypertension - Holding Entresto, spironolactone in the setting of AKI - Continue Coreg - Continue to monitor  CHF (congestive heart failure) (HCC) - Continue aspirin, Coreg, pravastatin - Holding Entresto and spironolactone in the setting of AKI  GERD (gastroesophageal reflux disease) - Holding PPI in the setting of AKI  High cholesterol - Continue statin      Advance Care Planning:   Code Status: Full Code  Consults: None at this time   Family Communication: no family at bedside  Severity of Illness: The appropriate patient status for this patient is OBSERVATION. Observation status is judged to be reasonable and necessary in order to provide the required intensity of service to ensure the patient's safety. The patient's presenting  symptoms, physical exam findings, and initial radiographic and laboratory data in the context of their medical condition is felt to place them at decreased risk for further clinical deterioration. Furthermore, it is anticipated that the patient will be medically stable for discharge from the hospital within 2 midnights of admission.   Author: Lilyan Gilford, DO 08/03/2023 5:32 AM  For on call review [email or web address].

## 2023-08-03 NOTE — Plan of Care (Signed)
  Problem: Education: Goal: Knowledge of General Education information will improve Description: Including pain rating scale, medication(s)/side effects and non-pharmacologic comfort measures Outcome: Not Met (add Reason)   Problem: Health Behavior/Discharge Planning: Goal: Ability to manage health-related needs will improve Outcome: Not Met (add Reason)

## 2023-08-03 NOTE — Assessment & Plan Note (Signed)
No infectious symptoms - Procalcitonin is undetectable - UA is not indicative of UTI - Chest x-ray shows no evidence of consolidation - Continue to monitor

## 2023-08-03 NOTE — Progress Notes (Signed)
   08/03/23 1302  TOC Brief Assessment  Insurance and Status Reviewed  Patient has primary care physician Yes  Home environment has been reviewed Home alone  Prior level of function: Independent  Prior/Current Home Services No current home services  Social Determinants of Health Reivew SDOH reviewed no interventions necessary  Readmission risk has been reviewed Yes  Transition of care needs no transition of care needs at this time

## 2023-08-04 DIAGNOSIS — R0789 Other chest pain: Secondary | ICD-10-CM

## 2023-08-04 DIAGNOSIS — K219 Gastro-esophageal reflux disease without esophagitis: Secondary | ICD-10-CM | POA: Diagnosis not present

## 2023-08-04 DIAGNOSIS — I10 Essential (primary) hypertension: Secondary | ICD-10-CM | POA: Diagnosis not present

## 2023-08-04 DIAGNOSIS — N179 Acute kidney failure, unspecified: Secondary | ICD-10-CM | POA: Diagnosis not present

## 2023-08-04 DIAGNOSIS — M79641 Pain in right hand: Secondary | ICD-10-CM

## 2023-08-04 DIAGNOSIS — R296 Repeated falls: Secondary | ICD-10-CM | POA: Diagnosis not present

## 2023-08-04 LAB — BASIC METABOLIC PANEL
Anion gap: 5 (ref 5–15)
BUN: 30 mg/dL — ABNORMAL HIGH (ref 8–23)
CO2: 24 mmol/L (ref 22–32)
Calcium: 8.4 mg/dL — ABNORMAL LOW (ref 8.9–10.3)
Chloride: 107 mmol/L (ref 98–111)
Creatinine, Ser: 1.54 mg/dL — ABNORMAL HIGH (ref 0.61–1.24)
GFR, Estimated: 46 mL/min — ABNORMAL LOW (ref >60)
Glucose, Bld: 92 mg/dL (ref 70–99)
Potassium: 4.2 mmol/L (ref 3.5–5.1)
Sodium: 136 mmol/L (ref 135–145)

## 2023-08-04 MED ORDER — ALLOPURINOL 100 MG PO TABS: 150.0000 mg | ORAL_TABLET | Freq: Every day | ORAL | 2 refills | Status: AC

## 2023-08-04 MED ORDER — PANTOPRAZOLE SODIUM 40 MG PO TBEC: 40.0000 mg | DELAYED_RELEASE_TABLET | Freq: Every day | ORAL | 1 refills | Status: AC

## 2023-08-04 NOTE — TOC Transition Note (Addendum)
Transition of Care [facility]) - CM/SW Discharge Note   Patient Details  Name: Don [name] MRN: [id number] Date of Birth: [birth year]  Transition of [facility]) CM/SW Contact:  Catalina Gravel, LCSW Phone Number: 08/04/2023, 3:47 PM   Clinical Narrative:     During progression, MD clarified if the recommended walker was addressed/ordered.  CSW spoke with pt who stated he does not have a walker and will accept .  Pt also consented to CSW contacting sister.  Contacted sister who confirmed pt lives with her and does not have a walker.  CSW reached out to [employer] for walker, DME need. CSW will advise pt- if walker not covered it will be about $75.00 [employer] checking insurance- CSW retrieved walker from supply room to present TOC to follow.    Addendum: [employer] verified insurance to cover walker, CSW  delivered to pt. Delivery ticket will be completed per provider request.     Patient Goals and CMS Choice      Discharge Placement                         Discharge Plan and Services Additional resources added to the After Visit Summary for                                       Social Determinants of Health (SDOH) Interventions SDOH Screenings   Food Insecurity: No Food Insecurity (08/03/2023)  Housing: Low Risk  (08/03/2023)  Transportation Needs: No Transportation Needs (08/03/2023)  Utilities: Not At Risk (08/03/2023)  Tobacco Use: Medium Risk (08/02/2023)     Readmission Risk Interventions     No data to display

## 2023-08-04 NOTE — Progress Notes (Signed)
Nsg Discharge Note  Admit Date:  08/02/2023 Discharge date: 08/04/2023   Don [name] to be D/C'd Home per MD order.  AVS completed.   Patient/caregiver able to verbalize understanding.  Discharge Medication: Allergies as of 08/04/2023       Reactions   Penicillins Swelling        Medication List     STOP taking these medications    omeprazole 20 MG capsule Commonly known as: PRILOSEC   spironolactone 25 MG tablet Commonly known as: ALDACTONE       TAKE these medications    allopurinol 100 MG tablet Commonly known as: Zyloprim Take 1.5 tablets (150 mg total) by mouth daily.   aspirin EC 81 MG tablet Take 81 mg by mouth daily.   calcitRIOL 0.25 MCG capsule Commonly known as: ROCALTROL Take 0.25 mcg by mouth daily.   carvedilol 6.25 MG tablet Commonly known as: COREG Take 1 tablet (6.25 mg total) by mouth 2 (two) times daily.   Entresto 24-26 MG Generic drug: sacubitril-valsartan Take 1 tablet by mouth 2 (two) times daily.   furosemide 20 MG tablet Commonly known as: LASIX Take 20 mg daily, Can take extra 20 mg for swelling,weight gain of 2-3 lbs overnight   ondansetron 4 MG tablet Commonly known as: ZOFRAN Take 1 tablet (4 mg total) by mouth every 6 (six) hours.   pantoprazole 40 MG tablet Commonly known as: PROTONIX Take 1 tablet (40 mg total) by mouth daily. Start taking on: August 05, 2023   pravastatin 40 MG tablet Commonly known as: PRAVACHOL Take 40 mg by mouth daily.   Vitamin D 50 MCG (2000 UT) Caps Take 2,000 Units by mouth daily.               Durable Medical Equipment  (From admission, onward)           Start     Ordered   08/03/23 1601  For home use only DME Walker rolling  Once       Question Answer Comment  Walker: With 5 Inch Wheels   Patient needs a walker to treat with the following condition Unsteadiness on feet      08/03/23 1600            Discharge Assessment: Vitals:   08/04/23 0440 08/04/23  1531  BP: (!) 147/94 116/74  Pulse: 63   Resp: 16   Temp: 99.1 F (37.3 C)   SpO2: 100% 100%   Skin clean, dry and intact without evidence of skin break down, no evidence of skin tears noted. IV catheter discontinued intact. Site without signs and symptoms of complications - no redness or edema noted at insertion site, patient denies c/o pain - only slight tenderness at site.  Dressing with slight pressure applied.  D/c Instructions-Education: Discharge instructions given to patient/family with verbalized understanding. D/c education completed with patient/family including follow up instructions, medication list, d/c activities limitations if indicated, with other d/c instructions as indicated by MD - patient able to verbalize understanding, all questions fully answered. Patient instructed to return to ED, call 911, or call MD for any changes in condition.  Patient escorted via WC, and D/C home via private auto.  Laurena Spies, RN 08/04/2023 3:36 PM

## 2023-08-04 NOTE — Discharge Summary (Signed)
Physician Discharge Summary   Patient: Don [name] MRN: [id number] DOB: 10-Sep-1946  Admit date:     08/02/2023  Discharge date: 08/04/23  Discharge Physician: Vassie Loll   PCP: Ponciano Ort, [facility]   Recommendations at discharge:  Reassess blood pressure and adjust antihypertensive treatment as needed Repeat basic metabolic panel to follow lites renal function Check uric acid and adjust allopurinol dose as required. Repeat CBC to follow hemoglobin/WBCs trend/stability.  Discharge Diagnoses: Principal Problem:   AKI (acute kidney injury) (HCC) Active Problems:   High cholesterol   GERD (gastroesophageal reflux disease)   CHF (congestive heart failure) (HCC)   Hypertension   Leukocytosis   Falls   Right hand pain   Atypical chest pain  Brief Hospital admission course: As per H&P written by Dr. Carren Rang on 08/03/23 Don [name] is a 77 y.o. male with medical history significant of cardiomyopathy, cerebrovascular disease, history of CHF, CKD, cirrhosis, hypertension, hyperlipidemia, history of hepatitis, and more presents to the ED with a chief complaint of chest pain.  Patient reports that the chest pain started day before yesterday.  Nothing made it worse and nothing made it better.  He describes it as sharp.  He has no associated shortness of breath.  He did have some nausea and reports a decreased appetite.  He is not sure how long the decreased appetite has been going on but he says "not long."  Patient reports he has had some weight loss, but he is not sure how much weight loss.  He denies any abdominal pain or dysuria.  From a cardiac standpoint, patient workup was pretty good.  He is being admitted because he had an AKI.  Patient is overall poor historian when asking the details of his presentation to the hospital today.   Patient no longer smokes.  He reports he does not drink anymore because drinking causes him to have green nausea and vomiting.  Patient is full  code.  Assessment and Plan: * AKI (acute kidney injury) on chronic kidney disease stage IIIb (HCC) - Creatinine at baseline 1.5, creatinine nearly 3 at time of admission -Appears to be secondary to the use of nephrotoxic agents, presenting azotemia and dehydration. -Patient advised to maintain adequate hydration -After fluid resuscitation and holding nephrotoxic agents renal function back to baseline. -Per chart review patient with chronic kidney disease stage IIIb with a GFR of 46. - Patient reports no appetite and decreased p.o. intake at home - Repeat basic metabolic panel at follow-up visit.  Falls -Reports more frequent falls at home - Appreciate assistance and recommendation by PT -Rolling walker and home health PT has been arranged at discharge.  Leukocytosis -No infectious symptoms or process identified -Most likely stress demargination -Patient advised to maintain adequate hydration -Repeat CBC at follow-up visit.  Hypertension - Continue treatment with Entresto, carvedilol and Lasix -Spironolactone has been discontinued at discharge -Patient instructed to follow heart healthy/low-sodium diet and to maintain adequate hydration.  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 -Condition is stable and compensated -Continue management with Entresto, Lasix and beta-blocker. -Holding spironolactone while kidney function fully recovers.    GERD (gastroesophageal reflux disease) - Continue PPI.  High cholesterol - Continue statin -Heart healthy diet discussed with patient.  Right hand pain/swelling/gout -Patient has been started on allopurinol -Lifestyle modification discussed with patient -Continue to maintain adequate hydration -Repeat uric acid at follow-up visit. -No tophi appreciated.  Consultants: None Procedures performed: See below for x-ray reports. Disposition: Home  with home health services. Diet recommendation: Heart healthy/low-sodium diet.  DISCHARGE MEDICATION: Allergies as of 08/04/2023       Reactions   Penicillins Swelling        Medication List     STOP taking these medications    omeprazole 20 MG capsule Commonly known as: PRILOSEC   spironolactone 25 MG tablet Commonly known as: ALDACTONE       TAKE these medications    allopurinol 100 MG tablet Commonly known as: Zyloprim Take 1.5 tablets (150 mg total) by mouth daily.   aspirin EC 81 MG tablet Take 81 mg by mouth daily.   calcitRIOL 0.25 MCG capsule Commonly known as: ROCALTROL Take 0.25 mcg by mouth daily.   carvedilol 6.25 MG tablet Commonly known as: COREG Take 1 tablet (6.25 mg total) by mouth 2 (two) times daily.   Entresto 24-26 MG Generic drug: sacubitril-valsartan Take 1 tablet by mouth 2 (two) times daily.   furosemide 20 MG tablet Commonly known as: LASIX Take 20 mg daily, Can take extra 20 mg for swelling,weight gain of 2-3 lbs overnight   ondansetron 4 MG tablet Commonly known as: ZOFRAN Take 1 tablet (4 mg total) by mouth every 6 (six) hours.   pantoprazole 40 MG tablet Commonly known as: PROTONIX Take 1 tablet (40 mg total) by mouth daily. Start taking on: August 05, 2023   pravastatin 40 MG tablet Commonly known as: PRAVACHOL Take 40 mg by mouth daily.   Vitamin D 50 MCG (2000 UT) Caps Take 2,000 Units by mouth daily.               Durable Medical Equipment  (From admission, onward)           Start     Ordered   08/03/23 1601  For home use only DME Walker rolling  Once       Question Answer Comment  Walker: With 5 Inch Wheels   Patient needs a walker to treat with the following condition Unsteadiness on feet      08/03/23 1600            Follow-up Information     [facility], [facility]. Schedule an appointment as soon as possible for a visit in 10 day(s).   Contact information: [address] [place] [address] [phone]                Discharge Exam: Filed Weights   08/02/23 2019 08/04/23 0500  Weight: 78 kg 79.1 kg   General exam: Alert, awake, oriented x 3; no chest pain, no nausea, no vomiting.  Reporting some pain and swelling in his right hand, but feeling ready to go home. Respiratory system: Clear to auscultation. Respiratory effort normal.  Good saturation on room air. Cardiovascular system:RRR. No rubs or gallops. Gastrointestinal system: Abdomen is nondistended, soft and nontender. No organomegaly or masses felt. Normal bowel sounds heard. Central nervous system: Moving 4 limbs spontaneously.  No focal neurological deficits. Extremities: No cyanosis, clubbing or edema; right hand with swelling appreciated in his metacarpal joint and expressing some pain with extension and flexion. Skin: No petechiae. Psychiatry: Judgement and insight appear normal. Mood & affect appropriate.    Condition at discharge: Stable and improved.  The results of significant diagnostics from this hospitalization (including imaging, microbiology, ancillary and laboratory) are listed below for reference.   Imaging Studies: DG Hand Complete Right  Result  Date: 08/02/2023 CLINICAL DATA:  Right hand pain and swelling after falling in the tub. EXAM: RIGHT HAND - COMPLETE 3+ VIEW COMPARISON:  None Available. FINDINGS: Assessment of the digits is limited due to positioning. Allowing for this, no evidence of acute fracture. Prominent osteoarthritis of the thumb, second and third metacarpal phalangeal joints. Scattered degenerative carpal bone cysts. Chondrocalcinosis of the triangular fibrocartilage. Vascular calcifications. There is dorsal soft tissue edema overlying the metacarpals. IMPRESSION: 1. Dorsal soft tissue edema. No acute fracture. 2. Osteoarthritis of the thumb, second and third metacarpophalangeal joints. Electronically Signed   By: Narda Rutherford M.D.   On: 08/02/2023  22:19   DG Chest 2 View  Result Date: 08/02/2023 CLINICAL DATA:  Chest pain. EXAM: CHEST - 2 VIEW COMPARISON:  Radiograph 07/19/2022 FINDINGS: Elevated right hemidiaphragm. Trace fluid in the fissures without large subpulmonic effusion. Right perihilar atelectasis. No pneumothorax. No confluent consolidation. No acute osseous abnormalities. IMPRESSION: 1. Elevated right hemidiaphragm with right perihilar atelectasis. 2. Trace fluid in the fissures. Electronically Signed   By: Narda Rutherford M.D.   On: 08/02/2023 22:18   CT ABDOMEN PELVIS W CONTRAST  Result Date: 07/24/2023 CLINICAL DATA:  Acute abdominal pain EXAM: CT ABDOMEN AND PELVIS WITH CONTRAST TECHNIQUE: Multidetector CT imaging of the abdomen and pelvis was performed using the standard protocol following bolus administration of intravenous contrast. RADIATION DOSE REDUCTION: This exam was performed according to the departmental dose-optimization program which includes automated exposure control, adjustment of the mA and/or kV according to patient size and/or use of iterative reconstruction technique. CONTRAST:  80mL OMNIPAQUE IOHEXOL 300 MG/ML  SOLN COMPARISON:  CT abdomen and pelvis 04/19/2019 FINDINGS: Lower chest: There is atelectasis or scarring in the left lung base. The heart is enlarged. Hepatobiliary: No focal liver abnormality is seen. No gallstones, gallbladder wall thickening, or biliary dilatation. Pancreas: Unremarkable. No pancreatic ductal dilatation or surrounding inflammatory changes. Spleen: Normal in size without focal abnormality. Adrenals/Urinary Tract: There is a subcentimeter hypodensity in the right kidney which is too small to characterize, likely a cyst. Otherwise, the kidneys, adrenal glands, and bladder are within normal limits. Stomach/Bowel: Stomach is within normal limits. Appendix appears normal. No evidence of bowel wall thickening, distention, or inflammatory changes. Scattered colonic diverticula are present.  Vascular/Lymphatic: Aortic atherosclerosis. No enlarged abdominal or pelvic lymph nodes. Reproductive: Prostate is unremarkable. Other: No abdominal wall hernia or abnormality. No abdominopelvic ascites. Musculoskeletal: Multilevel degenerative changes affect the spine. 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 IMPRESSION: 1. No acute localizing process in the abdomen or pelvis. 2. Colonic diverticulosis without evidence for diverticulitis. 3. Cardiomegaly. Aortic Atherosclerosis (ICD10-I70.0). Electronically Signed   By: Darliss Cheney M.D.   On: 07/24/2023 21:19   CT Head Wo Contrast  Result Date: 07/24/2023 CLINICAL DATA:  Head trauma, minor (Age >= 65y); Neck trauma (Age >= 65y) EXAM: CT HEAD WITHOUT CONTRAST CT CERVICAL SPINE WITHOUT CONTRAST TECHNIQUE: Multidetector CT imaging of the head and cervical spine was performed following the standard protocol without intravenous contrast. Multiplanar CT image reconstructions of the cervical spine were also generated. RADIATION DOSE REDUCTION: This exam was performed according to the departmental dose-optimization program which includes automated exposure control, adjustment of the mA and/or kV according to patient size and/or use of iterative reconstruction technique. COMPARISON:  CTA head/neck 02/19/22 FINDINGS: CT HEAD FINDINGS Brain: No hemorrhage. No hydrocephalus. No extra-axial fluid collection. No CT evidence of an acute cortical infarct. No mass effect. No mass lesion.  Vascular: No hyperdense vessel or unexpected calcification. Skull: Normal. Negative for fracture or focal lesion. Sinuses/Orbits: Middle ear or mastoid effusion. Paranasal sinuses are notable for thickening in the left sphenoid sinus. Orbits are unremarkable. Other: None. CT CERVICAL SPINE FINDINGS Alignment: Straightening of the normal cervical lordosis. Trace retrolisthesis of C5 on C6. Skull base and vertebrae: No acute fracture. No primary bone  lesion or focal pathologic process. Soft tissues and spinal canal: No prevertebral fluid or swelling. No visible canal hematoma. Disc levels:  No evidence of high-grade spinal canal stenosis Upper chest: Negative. Other: None IMPRESSION: 1. No CT evidence of intracranial injury. 2. No acute fracture or traumatic subluxation of the cervical spine. Electronically Signed   By: Lorenza Cambridge M.D.   On: 07/24/2023 16:36   CT Cervical Spine Wo Contrast  Result Date: 07/24/2023 CLINICAL DATA:  Head trauma, minor (Age >= 65y); Neck trauma (Age >= 65y) EXAM: CT HEAD WITHOUT CONTRAST CT CERVICAL SPINE WITHOUT CONTRAST TECHNIQUE: Multidetector CT imaging of the head and cervical spine was performed following the standard protocol without intravenous contrast. Multiplanar CT image reconstructions of the cervical spine were also generated. RADIATION DOSE REDUCTION: This exam was performed according to the departmental dose-optimization program which includes automated exposure control, adjustment of the mA and/or kV according to patient size and/or use of iterative reconstruction technique. COMPARISON:  CTA head/neck 02/19/22 FINDINGS: CT HEAD FINDINGS Brain: No hemorrhage. No hydrocephalus. No extra-axial fluid collection. No CT evidence of an acute cortical infarct. No mass effect. No mass lesion. Vascular: No hyperdense vessel or unexpected calcification. Skull: Normal. Negative for fracture or focal lesion. Sinuses/Orbits: Middle ear or mastoid effusion. Paranasal sinuses are notable for thickening in the left sphenoid sinus. Orbits are unremarkable. Other: None. CT CERVICAL SPINE FINDINGS Alignment: Straightening of the normal cervical lordosis. Trace retrolisthesis of C5 on C6. Skull base and vertebrae: No acute fracture. No primary bone lesion or focal pathologic process. Soft tissues and spinal canal: No prevertebral fluid or swelling. No visible canal hematoma. Disc levels:  No evidence of high-grade spinal canal  stenosis Upper chest: Negative. Other: None IMPRESSION: 1. No CT evidence of intracranial injury. 2. No acute fracture or traumatic subluxation of the cervical spine. Electronically Signed   By: Lorenza Cambridge M.D.   On: 07/24/2023 16:36   DG Hand Complete Right  Result Date: 07/24/2023 CLINICAL DATA:  Fall yesterday. Pain. Right hand swelling. Pain in thumb area. EXAM: RIGHT HAND - COMPLETE 3+ VIEW COMPARISON:  None Available. FINDINGS: There is diffuse decreased bone mineralization. Rings obscure portions of the proximal phalanges of the third and fourth fingers and overlying structures on oblique and lateral view. Mild thumb carpometacarpal, mild thumb metacarpophalangeal, minimal thumb interphalangeal, moderate third DIP, mild second DIP, moderate to severe second metacarpophalangeal, and moderate third metacarpophalangeal joint space narrowing and peripheral osteophytosis. The third DIP joint is in mild flexion positioning with mild medial/ulnar apex angulation of the joint on all the provided views. This is age indeterminate but no acute fracture is seen and this may be chronic. Tiny subchondral cyst within the distal scaphoid at the triscaphe joint without significant triscaphe joint space narrowing. Punctate ossicle at the medial aspect of the index finger DIP joint. Moderate atherosclerotic calcifications. IMPRESSION: 1. No acute fracture is seen. 2. Mild-to-moderate osteoarthritis as above. 3. The third DIP joint is in mild flexion positioning with mild medial/ulnar apex angulation on all the provided views. This is age indeterminate but again no acute fracture is seen and  this may be chronic. Electronically Signed   By: Neita Garnet M.D.   On: 07/24/2023 16:15    Microbiology: Results for orders placed or performed during the hospital encounter of 04/18/19  SARS Coronavirus 2 (CEPHEID - Performed in [facility] hospital lab), Hosp Order     Status: None   Collection Time: 04/18/19 11:46 PM    Specimen: Nasopharyngeal Swab  Result Value Ref Range Status   SARS Coronavirus 2 NEGATIVE NEGATIVE Final    Comment: (NOTE) If result is NEGATIVE SARS-CoV-2 target nucleic acids are NOT DETECTED. The SARS-CoV-2 RNA is generally detectable in upper and lower  respiratory specimens during the acute phase of infection. The lowest  concentration of SARS-CoV-2 viral copies this assay can detect is 250  copies / mL. A negative result does not preclude SARS-CoV-2 infection  and should not be used as the sole basis for treatment or other  patient management decisions.  A negative result may occur with  improper specimen collection / handling, submission of specimen other  than nasopharyngeal swab, presence of viral mutation(s) within the  areas targeted by this assay, and inadequate number of viral copies  (<250 copies / mL). A negative result must be combined with clinical  observations, patient history, and epidemiological information. If result is POSITIVE SARS-CoV-2 target nucleic acids are DETECTED. The SARS-CoV-2 RNA is generally detectable in upper and lower  respiratory specimens dur ing the acute phase of infection.  Positive  results are indicative of active infection with SARS-CoV-2.  Clinical  correlation with patient history and other diagnostic information is  necessary to determine patient infection status.  Positive results do  not rule out bacterial infection or co-infection with other viruses. If result is PRESUMPTIVE POSTIVE SARS-CoV-2 nucleic acids MAY BE PRESENT.   A presumptive positive result was obtained on the submitted specimen  and confirmed on repeat testing.  While 2019 novel coronavirus  (SARS-CoV-2) nucleic acids may be present in the submitted sample  additional confirmatory testing may be necessary for epidemiological  and / or clinical management purposes  to differentiate between  SARS-CoV-2 and other Sarbecovirus currently known to infect humans.  If  clinically indicated additional testing with an alternate test  methodology [phone]) is advised. The SARS-CoV-2 RNA is generally  detectable in upper and lower respiratory sp ecimens during the acute  phase of infection. The expected result is Negative. Fact Sheet for Patients:  BoilerBrush.com.cy Fact Sheet for Healthcare Providers: https://pope.com/ This test is not yet approved or cleared by the [place] FDA and has been authorized for detection and/or diagnosis of SARS-CoV-2 by FDA under an Emergency Use Authorization (EUA).  This EUA will remain in effect (meaning this test can be used) for the duration of the COVID-19 declaration under Section 564(b)(1) of the Act, 21 U.S.C. section 360bbb-3(b)(1), unless the authorization is terminated or revoked sooner. Performed at [facility], [address] [place], [place] [address]     Labs: CBC: Recent Labs  Lab 08/02/23 2113 08/03/23 0600  WBC 12.3* 11.4*  NEUTROABS  --  9.2*  HGB 9.9* 10.4*  HCT 28.4* 30.8*  MCV 95.0 94.5  PLT 264 269   Basic Metabolic Panel: Recent Labs  Lab 08/02/23 2113 08/03/23 0600 08/04/23 0330  NA 133* 134* 136  K 3.9 4.0 4.2  CL 99 103 107  CO2 23 24 24   GLUCOSE 109* 101* 92  BUN 51* 43* 30*  CREATININE 2.97* 2.13* 1.54*  CALCIUM 8.5* 8.5* 8.4*  MG  --  2.0  --    Liver Function Tests: Recent Labs  Lab 08/03/23 0600  AST 16  ALT 15  ALKPHOS 51  BILITOT 1.1  PROT 6.1*  ALBUMIN 2.6*   CBG: No results for input(s): "GLUCAP" in the last 168 hours.  Discharge time spent: greater than 30 minutes.  Signed: Vassie Loll, MD [facility] 08/04/2023

## 2023-08-15 ENCOUNTER — Emergency Department (HOSPITAL_COMMUNITY)
Admission: EM | Admit: 2023-08-15 | Discharge: 2023-08-15 | Disposition: A | Discharge status: Home / Self Care | Payer: 59 | Attending: Emergency Medicine | Admitting: Emergency Medicine

## 2023-08-15 ENCOUNTER — Encounter (HOSPITAL_COMMUNITY): Payer: Self-pay

## 2023-08-15 ENCOUNTER — Emergency Department (HOSPITAL_COMMUNITY): Payer: 59

## 2023-08-15 ENCOUNTER — Other Ambulatory Visit: Payer: Self-pay

## 2023-08-15 DIAGNOSIS — Z7982 Long term (current) use of aspirin: Secondary | ICD-10-CM | POA: Insufficient documentation

## 2023-08-15 DIAGNOSIS — I509 Heart failure, unspecified: Secondary | ICD-10-CM | POA: Diagnosis not present

## 2023-08-15 DIAGNOSIS — M7989 Other specified soft tissue disorders: Secondary | ICD-10-CM | POA: Diagnosis present

## 2023-08-15 DIAGNOSIS — N189 Chronic kidney disease, unspecified: Secondary | ICD-10-CM | POA: Diagnosis not present

## 2023-08-15 DIAGNOSIS — G629 Polyneuropathy, unspecified: Secondary | ICD-10-CM | POA: Insufficient documentation

## 2023-08-15 DIAGNOSIS — M79609 Pain in unspecified limb: Secondary | ICD-10-CM

## 2023-08-15 LAB — CBC WITH DIFFERENTIAL/PLATELET
Abs Immature Granulocytes: 0.08 K/uL — ABNORMAL HIGH (ref 0.00–0.07)
Basophils Absolute: 0 K/uL (ref 0.0–0.1)
Basophils Relative: 0 %
Eosinophils Absolute: 0.1 K/uL (ref 0.0–0.5)
Eosinophils Relative: 1 %
HCT: 28.4 % — ABNORMAL LOW (ref 39.0–52.0)
Hemoglobin: 9.6 g/dL — ABNORMAL LOW (ref 13.0–17.0)
Immature Granulocytes: 1 %
Lymphocytes Relative: 9 %
Lymphs Abs: 1.1 K/uL (ref 0.7–4.0)
MCH: 32.1 pg (ref 26.0–34.0)
MCHC: 33.8 g/dL (ref 30.0–36.0)
MCV: 95 fL (ref 80.0–100.0)
Monocytes Absolute: 0.7 K/uL (ref 0.1–1.0)
Monocytes Relative: 6 %
Neutro Abs: 10.3 K/uL — ABNORMAL HIGH (ref 1.7–7.7)
Neutrophils Relative %: 83 %
Platelets: 437 K/uL — ABNORMAL HIGH (ref 150–400)
RBC: 2.99 MIL/uL — ABNORMAL LOW (ref 4.22–5.81)
RDW: 12 % (ref 11.5–15.5)
WBC: 12.3 K/uL — ABNORMAL HIGH (ref 4.0–10.5)
nRBC: 0 % (ref 0.0–0.2)

## 2023-08-15 LAB — COMPREHENSIVE METABOLIC PANEL
ALT: 28 U/L (ref 0–44)
AST: 26 U/L (ref 15–41)
Albumin: 2.6 g/dL — ABNORMAL LOW (ref 3.5–5.0)
Alkaline Phosphatase: 73 U/L (ref 38–126)
Anion gap: 9 (ref 5–15)
BUN: 38 mg/dL — ABNORMAL HIGH (ref 8–23)
CO2: 26 mmol/L (ref 22–32)
Calcium: 9.3 mg/dL (ref 8.9–10.3)
Chloride: 101 mmol/L (ref 98–111)
Creatinine, Ser: 1.65 mg/dL — ABNORMAL HIGH (ref 0.61–1.24)
GFR, Estimated: 43 mL/min — ABNORMAL LOW (ref >60)
Glucose, Bld: 100 mg/dL — ABNORMAL HIGH (ref 70–99)
Potassium: 3.7 mmol/L (ref 3.5–5.1)
Sodium: 136 mmol/L (ref 135–145)
Total Bilirubin: 0.4 mg/dL (ref <1.2)
Total Protein: 7.3 g/dL (ref 6.5–8.1)

## 2023-08-15 LAB — BRAIN NATRIURETIC PEPTIDE: B Natriuretic Peptide: 129 pg/mL — ABNORMAL HIGH (ref 0.0–100.0)

## 2023-08-15 MED ORDER — GABAPENTIN 100 MG PO CAPS: 100.0000 mg | ORAL_CAPSULE | Freq: Once | ORAL | Status: DC

## 2023-08-15 MED ORDER — GABAPENTIN 300 MG PO CAPS: 300.0000 mg | ORAL_CAPSULE | Freq: Every day | ORAL | 0 refills | Status: DC

## 2023-08-15 MED ORDER — ACETAMINOPHEN 500 MG PO TABS
1000.0000 mg | ORAL_TABLET | Freq: Once | ORAL | Status: AC
  Administered 2023-08-15: 1000 mg via ORAL
  Filled 2023-08-15: qty 2

## 2023-08-15 NOTE — Discharge Instructions (Addendum)
You were seen for in the emergency department for hand and foot pain in the emergency department.  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  At home, please take the gabapentin for your neuropathy.    Check your MyChart online for the results of any tests that had not resulted by the time you left the emergency department.   Follow-up with your primary doctor in 2-3 days regarding your visit.    Return immediately to the emergency department if you experience any of the following: Numbness or weakness, or any other concerning symptoms.    Thank you for visiting our Emergency Department. It was a pleasure taking care of you today.

## 2023-08-15 NOTE — ED Notes (Signed)
Sister called hospital for update on patient. She stated she could not come to pick him up herself due to not having transport. Informed her That transport called and stated patient would be picked up in 15 minutes, to be transport him back home.

## 2023-08-15 NOTE — ED Provider Notes (Signed)
[place] EMERGENCY DEPARTMENT AT [facility] Provider Note   CSN: [id number] Arrival date & time: 08/15/23  1036     History  Chief Complaint  Patient presents with   Extremity Pain   extremity swelling    Don [name] is a 77 y.o. male.  77 year old male with a history of CHF, CKD, hepatitis, cirrhosis, and hyperlipidemia who presents to the emergency department with hand and feet swelling.  Patient reports that for the past several weeks has been having swelling of his hands and feet.  Now having difficulty when trying to walk so he decided to come into the emergency department. Says that he has been compliant with his diuretic.  Has been intermittently short of breath.  Says that he is also having pain that feels like pins-and-needles of his hands and feet.       Home Medications Prior to Admission medications   Medication Sig Start Date End Date Taking? Authorizing Provider  gabapentin (NEURONTIN) 300 MG capsule Take 1 capsule (300 mg total) by mouth at bedtime. 08/15/23 09/14/23 Yes Rondel Baton, MD  allopurinol (ZYLOPRIM) 100 MG tablet Take 1.5 tablets (150 mg total) by mouth daily. 08/04/23 08/03/24  Vassie Loll, MD  aspirin EC 81 MG tablet Take 81 mg by mouth daily.    [provider]  calcitRIOL (ROCALTROL) 0.25 MCG capsule Take 0.25 mcg by mouth daily.    [provider]  carvedilol (COREG) 6.25 MG tablet Take 1 tablet (6.25 mg total) by mouth 2 (two) times daily. 03/26/23   Jonelle Sidle, MD  Cholecalciferol (VITAMIN D) 50 MCG (2000 UT) CAPS Take 2,000 Units by mouth daily.    [provider]  furosemide (LASIX) 20 MG tablet Take 20 mg daily, Can take extra 20 mg for swelling,weight gain of 2-3 lbs overnight 02/26/23   Dyann Kief, PA-C  ondansetron (ZOFRAN) 4 MG tablet Take 1 tablet (4 mg total) by mouth every 6 (six) hours. 07/24/23   Carmel Sacramento A, PA-C  pantoprazole (PROTONIX) 40 MG tablet Take 1 tablet (40  mg total) by mouth daily. 08/05/23   Vassie Loll, MD  pravastatin (PRAVACHOL) 40 MG tablet Take 40 mg by mouth daily.    [provider]  sacubitril-valsartan (ENTRESTO) 24-26 MG Take 1 tablet by mouth 2 (two) times daily. 07/19/23   Jonelle Sidle, MD      Allergies    Penicillins    Review of Systems   Review of Systems  Physical Exam Updated Vital Signs BP (!) 140/66   Pulse 63   Temp (!) 96.8 F (36 C) (Oral)   Resp 19   Ht 5\' 6"  (1.676 m)   SpO2 95%   BMI 28.15 kg/m  Physical Exam Vitals and nursing note reviewed.  Constitutional:      General: He is not in acute distress.    Appearance: He is well-developed.  HENT:     Head: Normocephalic and atraumatic.     Right Ear: External ear normal.     Left Ear: External ear normal.     Nose: Nose normal.  Eyes:     Extraocular Movements: Extraocular movements intact.     Conjunctiva/sclera: Conjunctivae normal.     Pupils: Pupils are equal, round, and reactive to light.  Cardiovascular:     Rate and Rhythm: Normal rate and regular rhythm.     Heart sounds: Normal heart sounds.  Pulmonary:     Effort: Pulmonary effort is normal.  No respiratory distress.     Breath sounds: Normal breath sounds.  Musculoskeletal:     Cervical back: Normal range of motion and neck supple.     Right lower leg: Edema (1+) present.     Left lower leg: Edema (1+) present.     Comments: 1+ swelling of bilateral hands  Skin:    General: Skin is warm and dry.  Neurological:     Mental Status: He is alert. Mental status is at baseline.  Psychiatric:        Mood and Affect: Mood normal.        Behavior: Behavior normal.     ED Results / Procedures / Treatments   Labs (all labs ordered are listed, but only abnormal results are displayed) Labs Reviewed  CBC WITH DIFFERENTIAL/PLATELET - Abnormal; Notable for the following components:      Result Value   WBC 12.3 (*)    RBC 2.99 (*)    Hemoglobin 9.6 (*)    HCT 28.4 (*)     Platelets 437 (*)    Neutro Abs 10.3 (*)    Abs Immature Granulocytes 0.08 (*)    All other components within normal limits  COMPREHENSIVE METABOLIC PANEL - Abnormal; Notable for the following components:   Glucose, Bld 100 (*)    BUN 38 (*)    Creatinine, Ser 1.65 (*)    Albumin 2.6 (*)    GFR, Estimated 43 (*)    All other components within normal limits  BRAIN NATRIURETIC PEPTIDE - Abnormal; Notable for the following components:   B Natriuretic Peptide 129.0 (*)    All other components within normal limits    EKG None  Radiology DG Chest Portable 1 View  Result Date: 08/15/2023 CLINICAL DATA:  Fluid overload. EXAM: PORTABLE CHEST 1 VIEW COMPARISON:  08/02/2023. FINDINGS: Low lung volume. There are probable atelectatic changes/scarring at the left lung base. Bilateral lung fields are otherwise clear. No acute consolidation. Note is made of elevated right hemidiaphragm. No frank pulmonary edema. Bilateral costophrenic angles are clear. Stable mildly enlarged cardio-mediastinal silhouette, which is accentuated by low lung volume. No acute osseous abnormalities. The soft tissues are within normal limits. IMPRESSION: *No active disease. Electronically Signed   By: Jules Schick M.D.   On: 08/15/2023 14:17    Procedures Procedures    Medications Ordered in ED Medications  acetaminophen (TYLENOL) tablet 1,000 mg (1,000 mg Oral Given 08/15/23 1251)    ED Course/ Medical Decision Making/ A&P Clinical Course as of 08/15/23 1913  Wed Aug 15, 2023  1202 Creatinine(!): 1.65 At baseline [RP]  1202 Hemoglobin(!): 9.6 At baseline [RP]    Clinical Course User Index [RP] Rondel Baton, MD                                 Medical Decision Making Amount and/or Complexity of Data Reviewed Labs: ordered. Decision-making details documented in ED Course. Radiology: ordered.  Risk OTC drugs. Prescription drug management.   Don [name] is a 77 y.o. male with  comorbidities that complicate the patient evaluation including CHF, CKD, hepatitis, cirrhosis, and hyperlipidemia who presents emergency department with an and foot swelling and tingling  Initial Ddx:  Edema, CHF, CKD, low albumin/liver disease, peripheral neuropathy  MDM/Course:  Patient presents emergency department with hand and foot swelling and tingling.  On exam does not appear to be grossly volume overloaded though does have some  edema in these areas.  Not hypoxic and lung sounds are clear to auscultation bilaterally.  Labs were sent and creatinine at baseline.  BNP marginally elevated at 129.  Chest x-ray without acute abnormality.  Upon re-evaluation after Tylenol patient was feeling better and was able to walk.  Suspect that he may have peripheral neuropathy.  Will start him on gabapentin and have him follow-up with his primary doctor.  This patient presents to the ED for concern of complaints listed in HPI, this involves an extensive number of treatment options, and is a complaint that carries with it a high risk of complications and morbidity. Disposition including potential need for admission considered.   Dispo: DC Home. Return precautions discussed including, but not limited to, those listed in the AVS. Allowed pt time to ask questions which were answered fully prior to dc.  Records reviewed Outpatient Clinic Notes The following labs were independently interpreted: Chemistry and show CKD I independently reviewed the following imaging with scope of interpretation limited to determining acute life threatening conditions related to emergency care: Chest x-ray and agree with the radiologist interpretation with the following exceptions: none I personally reviewed and interpreted the pt's EKG: see above for interpretation  I have reviewed the patients home medications and made adjustments as needed Social Determinants of health:  Elderly  Portions of this note were generated with Administrator, sports. Dictation errors may occur despite best attempts at proofreading.          Final Clinical Impression(s) / ED Diagnoses Final diagnoses:  Pain in extremity, unspecified extremity  Neuropathy    Rx / DC Orders ED Discharge Orders          Ordered    gabapentin (NEURONTIN) 300 MG capsule  Daily at bedtime        08/15/23 1411              Rondel Baton, MD 08/15/23 1913

## 2023-08-15 NOTE — ED Notes (Signed)
Spoke with pt about ambulating. Pt is unwilling, states "I don't feel like I can right now, I'm feeling too short of breath."

## 2023-08-15 NOTE — ED Notes (Signed)
Attempted to contact Sister, unable to reach at this time.

## 2023-08-15 NOTE — ED Triage Notes (Signed)
Pt BIB [facility], patient was at work, complaint of pain and swelling to hands and feet. Patient takes a fluid pill, last time he took fluid medication yesterday. Swelling has been going on for a couple of weeks.

## 2023-08-15 NOTE — ED Notes (Signed)
Urinal given to patient. Call bell within reach.

## 2023-08-21 ENCOUNTER — Other Ambulatory Visit: Payer: Self-pay | Admitting: Physician Assistant

## 2023-09-01 IMAGING — CT CT CERVICAL SPINE W/O CM
3 of 5 series · 11 of 34 positions shown, 13 images · IV contrast (Omnipaque or Isovue)
Comparison: None Available.

CLINICAL DATA: Left-sided neck pain, chronic.

EXAM:
CT CERVICAL SPINE WITHOUT CONTRAST
TECHNIQUE: Multidetector CT imaging of the cervical spine was performed without
additional intravenous contrast. Intravenous contrast was utilized
for the current CT angiogram of the head and neck, and is present on
the cervical spine images. Multiplanar CT image reconstructions were
also generated.
RADIATION DOSE REDUCTION: This exam was performed according to the
departmental dose-optimization program which includes automated
exposure control, adjustment of the mA and/or kV according to
patient size and/or use of iterative reconstruction technique.

[Series 12: cor c spine cor · coronal · 0.25mm/px · 3 of 71 slices shown]
[im 15/71  bone]
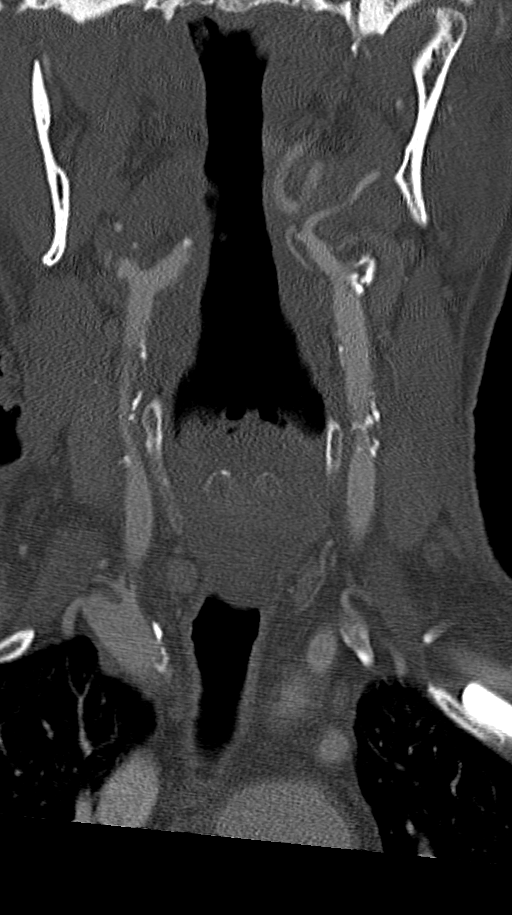
[im 29/71  bone]
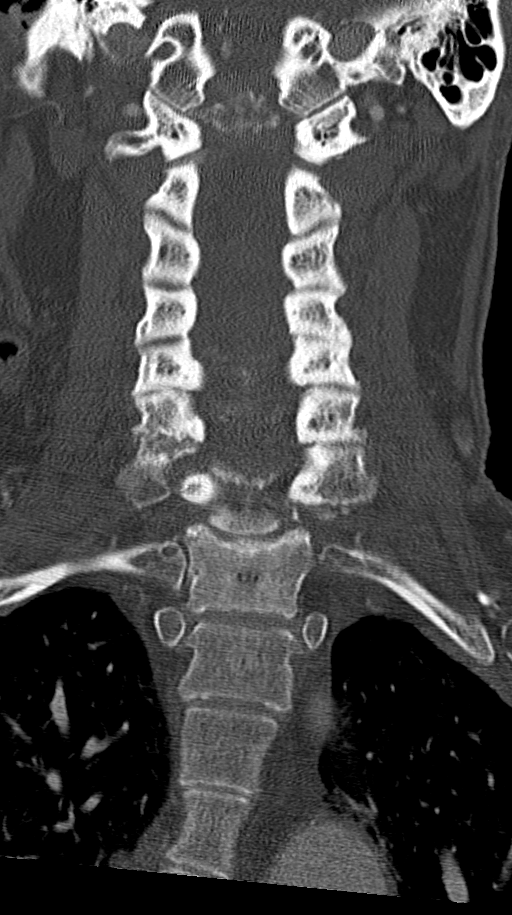
[im 43/71  bone]
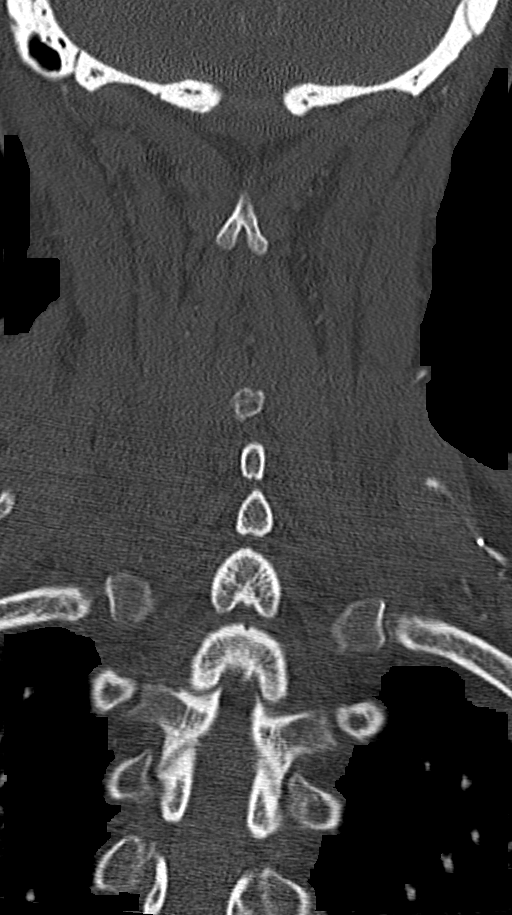

[Series 13: sag c spine sag · sagittal · 0.30mm/px · 5 of 68 slices shown, 6 images]
[im 23/68  bone]
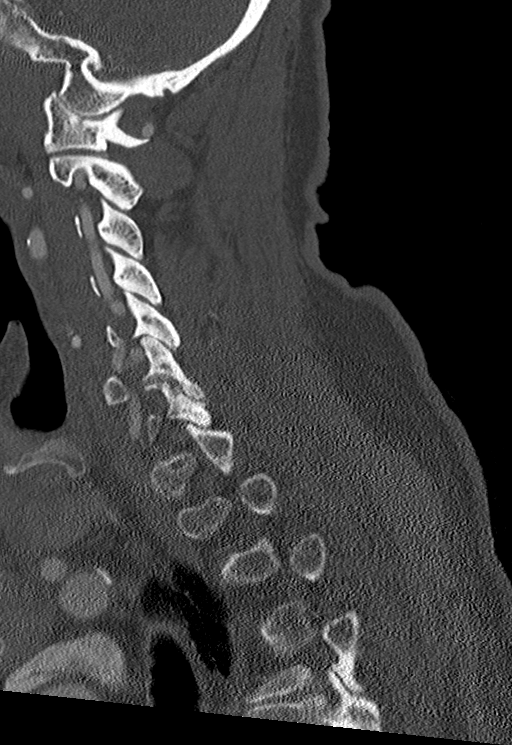
[im 28/68  bone]
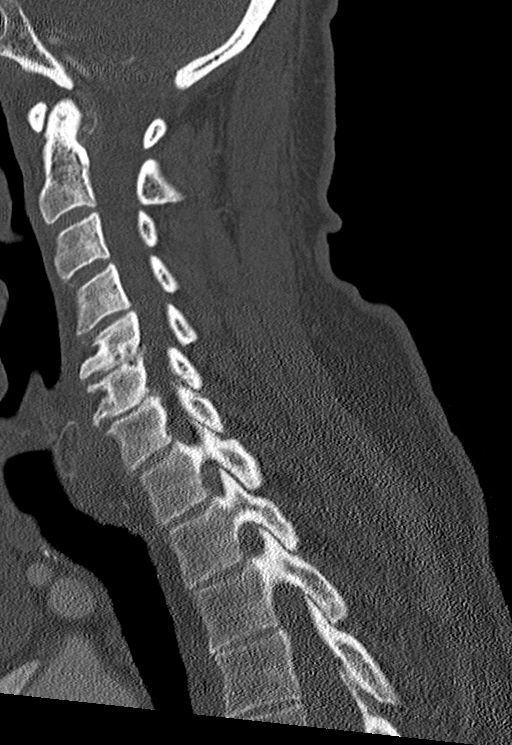
[im 34/68  soft-tissue]
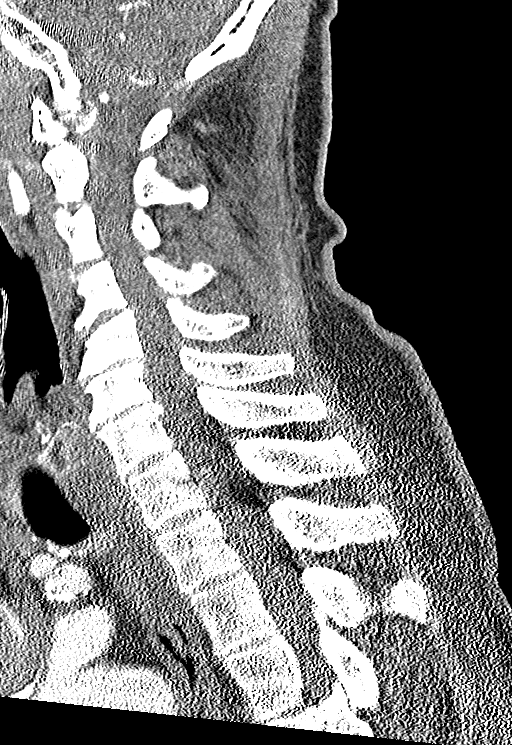
[im 34/68  bone]
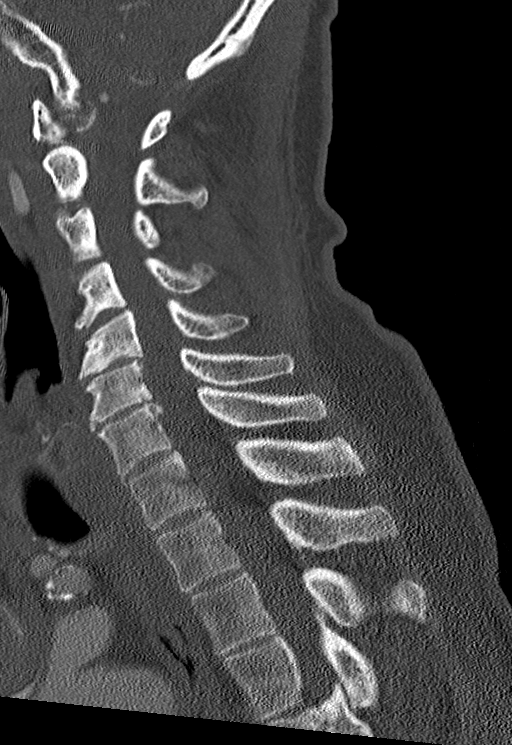
[im 40/68  bone]
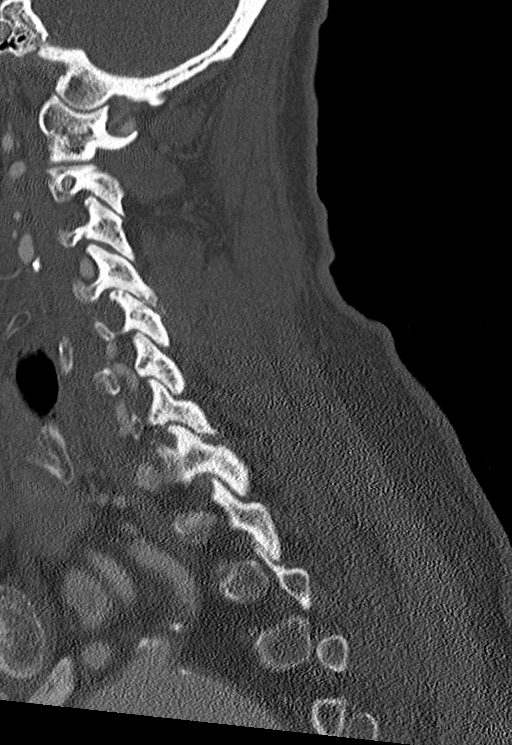
[im 45/68  bone]
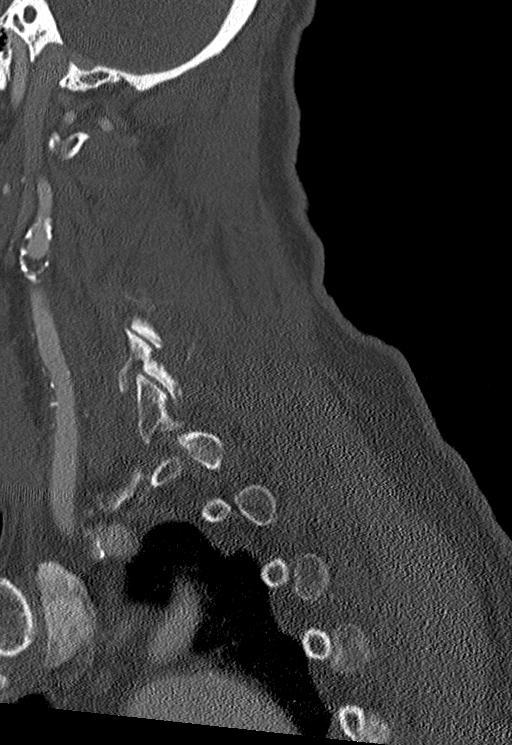

[Series 14: c spine st · axial · 0.34mm/px · z∈[-89,+11]mm · 3 of 100 slices shown, 4 images]
[im 25/100  soft-tissue]
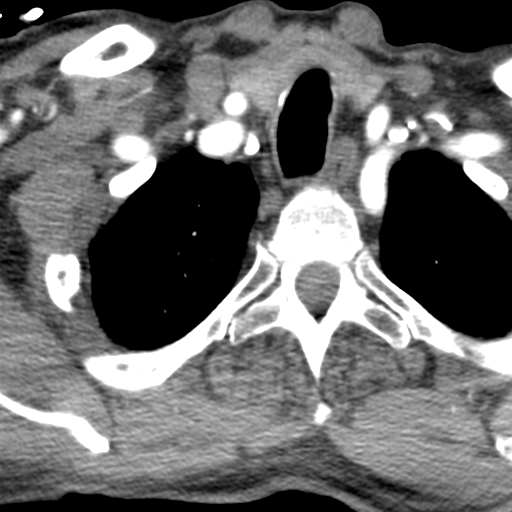
[im 25/100  bone]
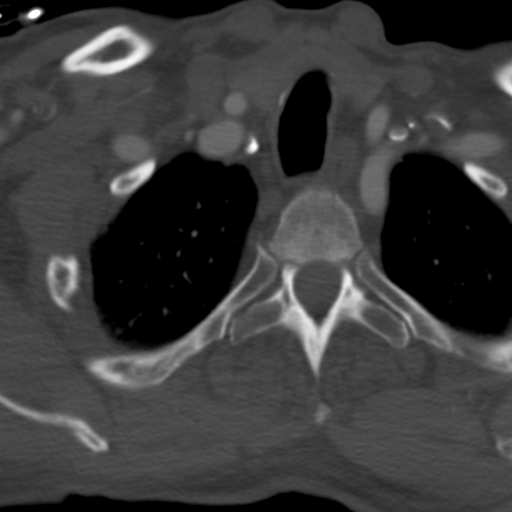
[im 50/100  bone]
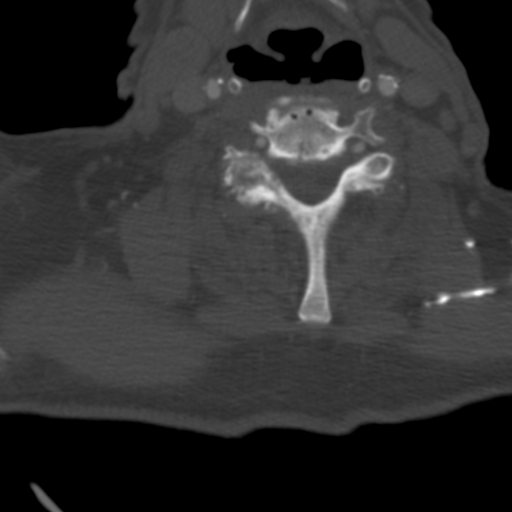
[im 75/100  bone]
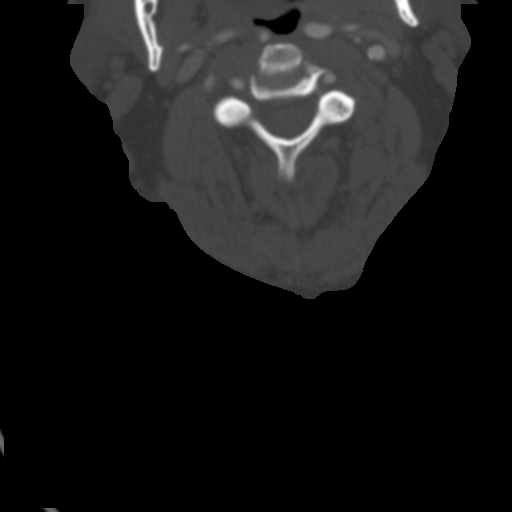

[11 of 34 positions shown; findings below may reference images not displayed]

FINDINGS: Alignment: Straightened cervical lordosis.  No spondylolisthesis.

Skull base and vertebrae: No acute fracture. No primary bone lesion
or focal pathologic process.

Soft tissues and spinal canal: No prevertebral fluid or swelling. No
visible canal hematoma.

Disc levels: Moderate loss of disc height at C4-C5 with moderate to
marked loss of disc height at C5-C6 and C6-C7. Spondylotic disc
bulging with endplate spurring is noted at C5-C6 and C6-C7. There
are facet degenerative changes at are greatest on the right at
C6-C7. No convincing disc herniation.

Upper chest: No acute findings.

Other: None.
IMPRESSION: 1. No fracture or acute finding.  No spondylolisthesis.
2. Mid to lower cervical spine disc and facet degenerative changes.

## 2023-09-03 ENCOUNTER — Emergency Department (HOSPITAL_COMMUNITY)
Admission: EM | Admit: 2023-09-03 | Discharge: 2023-09-03 | Disposition: A | Discharge status: Home / Self Care | Payer: 59 | Attending: Emergency Medicine | Admitting: Emergency Medicine

## 2023-09-03 ENCOUNTER — Other Ambulatory Visit: Payer: Self-pay

## 2023-09-03 ENCOUNTER — Ambulatory Visit: Payer: 59 | Attending: Cardiology | Admitting: Cardiology

## 2023-09-03 ENCOUNTER — Encounter (HOSPITAL_COMMUNITY): Payer: Self-pay | Admitting: Emergency Medicine

## 2023-09-03 ENCOUNTER — Emergency Department (HOSPITAL_COMMUNITY): Payer: 59

## 2023-09-03 DIAGNOSIS — I10 Essential (primary) hypertension: Secondary | ICD-10-CM | POA: Diagnosis not present

## 2023-09-03 DIAGNOSIS — M1029 Drug-induced gout, multiple sites: Secondary | ICD-10-CM | POA: Insufficient documentation

## 2023-09-03 DIAGNOSIS — Z79899 Other long term (current) drug therapy: Secondary | ICD-10-CM | POA: Diagnosis not present

## 2023-09-03 DIAGNOSIS — R531 Weakness: Secondary | ICD-10-CM | POA: Insufficient documentation

## 2023-09-03 DIAGNOSIS — Z7982 Long term (current) use of aspirin: Secondary | ICD-10-CM | POA: Diagnosis not present

## 2023-09-03 DIAGNOSIS — M7989 Other specified soft tissue disorders: Secondary | ICD-10-CM | POA: Diagnosis present

## 2023-09-03 LAB — CBC WITH DIFFERENTIAL/PLATELET
Abs Immature Granulocytes: 0.04 10*3/uL (ref 0.00–0.07)
Basophils Absolute: 0 10*3/uL (ref 0.0–0.1)
Basophils Relative: 0 %
Eosinophils Absolute: 0.1 10*3/uL (ref 0.0–0.5)
Eosinophils Relative: 1 %
HCT: 26.8 % — ABNORMAL LOW (ref 39.0–52.0)
Hemoglobin: 8.7 g/dL — ABNORMAL LOW (ref 13.0–17.0)
Immature Granulocytes: 1 %
Lymphocytes Relative: 14 %
Lymphs Abs: 1.2 10*3/uL (ref 0.7–4.0)
MCH: 31.9 pg (ref 26.0–34.0)
MCHC: 32.5 g/dL (ref 30.0–36.0)
MCV: 98.2 fL (ref 80.0–100.0)
Monocytes Absolute: 1 10*3/uL (ref 0.1–1.0)
Monocytes Relative: 12 %
Neutro Abs: 6.5 10*3/uL (ref 1.7–7.7)
Neutrophils Relative %: 72 %
Platelets: 246 10*3/uL (ref 150–400)
RBC: 2.73 MIL/uL — ABNORMAL LOW (ref 4.22–5.81)
RDW: 12.9 % (ref 11.5–15.5)
WBC: 8.9 10*3/uL (ref 4.0–10.5)
nRBC: 0 % (ref 0.0–0.2)

## 2023-09-03 LAB — BASIC METABOLIC PANEL
Anion gap: 10 (ref 5–15)
BUN: 24 mg/dL — ABNORMAL HIGH (ref 8–23)
CO2: 28 mmol/L (ref 22–32)
Calcium: 8.9 mg/dL (ref 8.9–10.3)
Chloride: 98 mmol/L (ref 98–111)
Creatinine, Ser: 1.92 mg/dL — ABNORMAL HIGH (ref 0.61–1.24)
GFR, Estimated: 35 mL/min — ABNORMAL LOW (ref >60)
Glucose, Bld: 102 mg/dL — ABNORMAL HIGH (ref 70–99)
Potassium: 4 mmol/L (ref 3.5–5.1)
Sodium: 136 mmol/L (ref 135–145)

## 2023-09-03 MED ORDER — PREDNISONE 10 MG PO TABS: 20.0000 mg | ORAL_TABLET | Freq: Every day | ORAL | 0 refills | Status: DC

## 2023-09-03 NOTE — Discharge Instructions (Signed)
Please follow-up with your primary care provider and cardiologist in regards to recent symptoms and ER visit.  Today your labs and imaging show the most likely of a gout exacerbation that could be caused by your Lasix and so you need to speak to them about possible medication changes for your heart failure.  If symptoms change or worsen please return to the ER.

## 2023-09-03 NOTE — ED Triage Notes (Signed)
Weakness, swelling to RT hand and RT Leg that started yesterday according to what family told ems.  Takes fluid medication.

## 2023-09-03 NOTE — ED Provider Notes (Signed)
[place] EMERGENCY DEPARTMENT AT [facility] Provider Note   CSN: [id number] Arrival date & time: 09/03/23  1245     History  Chief Complaint  Patient presents with   Weakness    Don [name] is a 77 y.o. male history of gout, cirrhosis, hypertension, hepatitis C, heart failure presented for right leg and right hand swelling.  Patient states that he slipped in the shower 2 days ago and caught himself with his right hand outstretched.  Patient's been removed however states this came progressively swollen.  When asked what medications he takes he takes patient responds with "something."  Patient states at the same time his right leg began to swell as well.  Patient denies any recent travel hospitalizations or surgeries or previous blood clots.  Patient dates he has been taking his fluid pills.  Patient denies chest pain, shortness of breath, fevers.  Patient states that he is unable to range his right ankle due to all the swelling.  Sensation is still intact in both extremities.  Patient denies hitting his head, head pain, neck pain, vision changes, dysuria.    Home Medications Prior to Admission medications   Medication Sig Start Date End Date Taking? Authorizing Provider  predniSONE (DELTASONE) 10 MG tablet Take 2 tablets (20 mg total) by mouth daily. 09/03/23  Yes Rosina Cressler, Beverly Gust, PA-C  allopurinol (ZYLOPRIM) 100 MG tablet Take 1.5 tablets (150 mg total) by mouth daily. 08/04/23 08/03/24  Vassie Loll, MD  aspirin EC 81 MG tablet Take 81 mg by mouth daily.    [provider]  calcitRIOL (ROCALTROL) 0.25 MCG capsule Take 0.25 mcg by mouth daily.    [provider]  carvedilol (COREG) 6.25 MG tablet Take 1 tablet (6.25 mg total) by mouth 2 (two) times daily. 03/26/23   Jonelle Sidle, MD  Cholecalciferol (VITAMIN D) 50 MCG (2000 UT) CAPS Take 2,000 Units by mouth daily.    [provider]  furosemide (LASIX) 20 MG tablet TAKE 1 TABLET BY MOUTH  ONCE DAILY, MAY TAKE EXTRA TABLET FOR SWELLING, WEIGHT GAIN OF 2-3 POUNDS OVERNIGHT. 08/22/23   Dyann Kief, PA-C  gabapentin (NEURONTIN) 300 MG capsule Take 1 capsule (300 mg total) by mouth at bedtime. 08/15/23 09/14/23  Rondel Baton, MD  ondansetron (ZOFRAN) 4 MG tablet Take 1 tablet (4 mg total) by mouth every 6 (six) hours. 07/24/23   Carmel Sacramento A, PA-C  pantoprazole (PROTONIX) 40 MG tablet Take 1 tablet (40 mg total) by mouth daily. 08/05/23   Vassie Loll, MD  pravastatin (PRAVACHOL) 40 MG tablet Take 40 mg by mouth daily.    [provider]  sacubitril-valsartan (ENTRESTO) 24-26 MG Take 1 tablet by mouth 2 (two) times daily. 07/19/23   Jonelle Sidle, MD      Allergies    Penicillins    Review of Systems   Review of Systems  Neurological:  Positive for weakness.    Physical Exam Updated Vital Signs BP 113/60 (BP Location: Left Arm)   Pulse 72   Temp 98.8 F (37.1 C) (Oral)   Resp 18   Ht 5\' 6"  (1.676 m)   Wt 79 kg   SpO2 98%   BMI 28.11 kg/m  Physical Exam Constitutional:      General: He is not in acute distress. Cardiovascular:     Rate and Rhythm: Normal rate and regular rhythm.     Pulses: Normal pulses.  Musculoskeletal:     Comments: Unable  to range right ankle due to swelling Minimal range of motion in the right hand due to swelling No bony tenderness or abnormalities palpated No crepitus noted Soft compartments  Skin:    General: Skin is warm and dry.     Capillary Refill: Capillary refill takes less than 2 seconds.     Findings: Erythema present.     Comments: Right hand and right ankle are both warm to palpation without fluctuance or drainage surgical wounds  Neurological:     Mental Status: He is alert and oriented to person, place, and time.  Psychiatric:        Mood and Affect: Mood normal.     ED Results / Procedures / Treatments   Labs (all labs ordered are listed, but only abnormal results are  displayed) Labs Reviewed  BASIC METABOLIC PANEL - Abnormal; Notable for the following components:      Result Value   Glucose, Bld 102 (*)    BUN 24 (*)    Creatinine, Ser 1.92 (*)    GFR, Estimated 35 (*)    All other components within normal limits  CBC WITH DIFFERENTIAL/PLATELET - Abnormal; Notable for the following components:   RBC 2.73 (*)    Hemoglobin 8.7 (*)    HCT 26.8 (*)    All other components within normal limits    EKG None  Radiology DG Ankle Complete Right  Result Date: 09/03/2023 CLINICAL DATA:  Pain and swelling right ankle EXAM: RIGHT ANKLE - COMPLETE 3+ VIEW COMPARISON:  None available FINDINGS: Mild soft tissue swelling overlying the lateral malleolus. Nonspecific soft tissue calcifications seen adjacent to the tip of the lateral malleolus. Atherosclerotic changes seen throughout visualized arterial segments. IMPRESSION: Mild soft tissue swelling overlying the lateral malleolus without underlying fracture or dislocation. Electronically Signed   By: Acquanetta Belling M.D.   On: 09/03/2023 14:52   DG Hand Complete Right  Result Date: 09/03/2023 CLINICAL DATA:  Pain and swelling EXAM: RIGHT HAND - COMPLETE 3+ VIEW COMPARISON:  None available FINDINGS: Calcifications noted in the triangular fibrocartilage. Diffuse atherosclerotic calcifications of the visualized arteries. Lucency along the dorsal aspect of the head of the third metacarpal with overlying soft tissue swelling is suspicious for an erosion. Possible erosion also noted in the distal tip of the middle phalanx of the third digit. IMPRESSION: 1. No fracture or dislocation. 2. Lucencies seen in the head of the third metacarpal and distal tip of the middle phalanx of the third digit are suspicious for erosions as the result of inflammatory arthropathy. Electronically Signed   By: Acquanetta Belling M.D.   On: 09/03/2023 14:50   US Venous Img Lower Right (DVT Study)  Result Date: 09/03/2023 CLINICAL DATA:  Right lower  extremity swelling for 1 day EXAM: RIGHT LOWER EXTREMITY VENOUS DOPPLER ULTRASOUND TECHNIQUE: Gray-scale sonography with compression, as well as color and duplex ultrasound, were performed to evaluate the deep venous system(s) from the level of the common femoral vein through the popliteal and proximal calf veins. COMPARISON:  None available FINDINGS: VENOUS Normal compressibility of the common femoral, superficial femoral, and popliteal veins, as well as the visualized calf veins. Visualized portions of profunda femoral vein and great saphenous vein unremarkable. No filling defects to suggest DVT on grayscale or color Doppler imaging. Doppler waveforms show normal direction of venous flow, normal respiratory plasticity and response to augmentation. Limited views of the contralateral common femoral vein are unremarkable. OTHER None. Limitations: none IMPRESSION: No right lower extremity DVT. Electronically  Signed   By: Acquanetta Belling M.D.   On: 09/03/2023 14:46    Procedures Procedures    Medications Ordered in ED Medications - No data to display  ED Course/ Medical Decision Making/ A&P                                 Medical Decision Making Amount and/or Complexity of Data Reviewed Labs: ordered. Radiology: ordered.   Hermenia Bers Giel 77 y.o. presented today for right hand and right leg swelling. Working DDx that I considered at this time includes, but not limited to, cellulitis, fracture, DVT, neurovascular compromise, gout.  R/o DDx: cellulitis, fracture, DVT, neurovascular compromise: These are considered less likely due to history of present illness, physical exam, labs/imaging findings  Review of prior external notes: 08/15/2023 ED provider  Unique Tests and My Interpretation:  CBC: Hemoglobin 8.7 down from 9.6 BMP: Around baseline Right hand x-ray: Erosions noted from inflammatory process Right ankle x-ray: Unremarkable Right lower extremity DVT study: No acute findings  Social  Determinants of Health: EtOH/Substance Abuse  Discussion with Independent Historian: None  Discussion of Management of Tests: None  Risk: Medium: prescription drug management  Risk Stratification Score: none  Staffed with Elayne Snare, DO  Plan: On exam patient was no acute distress with stable vitals.  Patient does have right lower leg nonpitting edema along with swelling in his right hand but is neurovascularly intact.  Skin both areas is warm with erythema indicative of possible cellulitis or gout exacerbation as patient does have history of same.  Will obtain basic labs along with imaging and ultrasound to rule out DVT in the right lower extremity however do suspect this is gout versus cellulitis.  Patient's ultrasound was negative for blood clot.  Pending the rest of patient's labs and imaging to plan on treating patient's symptoms with prednisone as patient is not having infectious symptoms that would indicate cellulitis at this time.  Labs and imaging were reassuring.  At this time do believe this is gout exacerbation most likely exacerbated by the Lasix as this is a new medication and we will recommend that he follows up with his cardiologist and primary care provider as he states he already has appointments with them shortly.  Will give prednisone in the meantime and advised him to avoid ibuprofen or naproxen as patient's kidney function is chronically elevated.  Patient's hemoglobin was slightly lower than previous however patient denies any hematemesis, melena, hematochezia or other bleeding at this time and since it is stable we will have him follow-up.  Patient was given return precautions. Patient stable for discharge at this time.  Patient verbalized understanding of plan.  This chart was dictated using voice recognition software.  Despite best efforts to proofread,  errors can occur which can change the documentation meaning.         Final Clinical Impression(s) / ED  Diagnoses Final diagnoses:  Acute drug-induced gout of multiple sites    Rx / DC Orders ED Discharge Orders          Ordered    predniSONE (DELTASONE) 10 MG tablet  Daily        09/03/23 1531              Netta Corrigan, PA-C 09/03/23 1534    Royanne Foots, DO 09/09/23 2017

## 2023-09-04 ENCOUNTER — Encounter: Payer: Self-pay | Admitting: Cardiology

## 2023-09-14 ENCOUNTER — Emergency Department (HOSPITAL_COMMUNITY)
Admission: EM | Admit: 2023-09-14 | Discharge: 2023-09-15 | Disposition: A | Discharge status: Home / Self Care | Payer: 59 | Attending: Emergency Medicine | Admitting: Emergency Medicine

## 2023-09-14 ENCOUNTER — Encounter (HOSPITAL_COMMUNITY): Payer: Self-pay | Admitting: *Deleted

## 2023-09-14 ENCOUNTER — Other Ambulatory Visit: Payer: Self-pay

## 2023-09-14 ENCOUNTER — Emergency Department (HOSPITAL_COMMUNITY): Payer: 59

## 2023-09-14 DIAGNOSIS — R55 Syncope and collapse: Secondary | ICD-10-CM | POA: Insufficient documentation

## 2023-09-14 DIAGNOSIS — I10 Essential (primary) hypertension: Secondary | ICD-10-CM | POA: Diagnosis not present

## 2023-09-14 DIAGNOSIS — R42 Dizziness and giddiness: Secondary | ICD-10-CM | POA: Insufficient documentation

## 2023-09-14 DIAGNOSIS — Z7982 Long term (current) use of aspirin: Secondary | ICD-10-CM | POA: Insufficient documentation

## 2023-09-14 DIAGNOSIS — Z79899 Other long term (current) drug therapy: Secondary | ICD-10-CM | POA: Insufficient documentation

## 2023-09-14 LAB — URINALYSIS, ROUTINE W REFLEX MICROSCOPIC
Bacteria, UA: NONE SEEN
Bilirubin Urine: NEGATIVE
Glucose, UA: >=500 mg/dL — AB
Hgb urine dipstick: NEGATIVE
Ketones, ur: NEGATIVE mg/dL
Leukocytes,Ua: NEGATIVE
Nitrite: NEGATIVE
Protein, ur: NEGATIVE mg/dL
Specific Gravity, Urine: 1.007 (ref 1.005–1.030)
pH: 6 (ref 5.0–8.0)

## 2023-09-14 LAB — CBC
HCT: 33.8 % — ABNORMAL LOW (ref 39.0–52.0)
Hemoglobin: 10.5 g/dL — ABNORMAL LOW (ref 13.0–17.0)
MCH: 30.5 pg (ref 26.0–34.0)
MCHC: 31.1 g/dL (ref 30.0–36.0)
MCV: 98.3 fL (ref 80.0–100.0)
Platelets: 415 10*3/uL — ABNORMAL HIGH (ref 150–400)
RBC: 3.44 MIL/uL — ABNORMAL LOW (ref 4.22–5.81)
RDW: 12.7 % (ref 11.5–15.5)
WBC: 15.8 10*3/uL — ABNORMAL HIGH (ref 4.0–10.5)
nRBC: 0 % (ref 0.0–0.2)

## 2023-09-14 LAB — TROPONIN I (HIGH SENSITIVITY)
Troponin I (High Sensitivity): 20 ng/L — ABNORMAL HIGH (ref <18)
Troponin I (High Sensitivity): 22 ng/L — ABNORMAL HIGH (ref <18)

## 2023-09-14 LAB — HEPATIC FUNCTION PANEL
ALT: 26 U/L (ref 0–44)
AST: 21 U/L (ref 15–41)
Albumin: 2.5 g/dL — ABNORMAL LOW (ref 3.5–5.0)
Alkaline Phosphatase: 105 U/L (ref 38–126)
Bilirubin, Direct: 0.2 mg/dL (ref 0.0–0.2)
Indirect Bilirubin: 0.5 mg/dL (ref 0.3–0.9)
Total Bilirubin: 0.7 mg/dL (ref <1.2)
Total Protein: 7.3 g/dL (ref 6.5–8.1)

## 2023-09-14 LAB — BASIC METABOLIC PANEL
Anion gap: 13 (ref 5–15)
BUN: 43 mg/dL — ABNORMAL HIGH (ref 8–23)
CO2: 27 mmol/L (ref 22–32)
Calcium: 9.1 mg/dL (ref 8.9–10.3)
Chloride: 97 mmol/L — ABNORMAL LOW (ref 98–111)
Creatinine, Ser: 2.18 mg/dL — ABNORMAL HIGH (ref 0.61–1.24)
GFR, Estimated: 30 mL/min — ABNORMAL LOW (ref >60)
Glucose, Bld: 163 mg/dL — ABNORMAL HIGH (ref 70–99)
Potassium: 4.1 mmol/L (ref 3.5–5.1)
Sodium: 137 mmol/L (ref 135–145)

## 2023-09-14 MED ORDER — SODIUM CHLORIDE 0.9 % IV BOLUS
500.0000 mL | Freq: Once | INTRAVENOUS | Status: AC
  Administered 2023-09-14: 500 mL via INTRAVENOUS

## 2023-09-14 NOTE — ED Notes (Signed)
Pt declines urinating at this time, pt states, "I haven't drink anything but a glass of water today. I want to wait a little while longer."

## 2023-09-14 NOTE — ED Provider Notes (Signed)
[place] EMERGENCY DEPARTMENT AT [facility] Provider Note   CSN: [id number] Arrival date & time: 09/14/23  1002     History  Chief Complaint  Patient presents with   Loss of Consciousness    Don [name] is a 77 y.o. male.  Patient has a history of hypertension and cardiomegaly and cirrhosis.  Patient was on the commode having a bowel movement and had a syncopal episode.  Patient has no complaints now  The history is provided by the patient and medical records. No language interpreter was used.  Loss of Consciousness Episode history:  Single Most recent episode:  Today Timing:  Unable to specify Progression:  Resolved Chronicity:  New Context: not blood draw   Witnessed: no   Relieved by:  Nothing Worsened by:  Nothing Ineffective treatments:  None tried Associated symptoms: dizziness   Associated symptoms: no anxiety, no chest pain, no headaches and no seizures        Home Medications Prior to Admission medications   Medication Sig Start Date End Date Taking? Authorizing Provider  allopurinol (ZYLOPRIM) 100 MG tablet Take 1.5 tablets (150 mg total) by mouth daily. 08/04/23 08/03/24  Vassie Loll, MD  aspirin EC 81 MG tablet Take 81 mg by mouth daily.    [provider]  calcitRIOL (ROCALTROL) 0.25 MCG capsule Take 0.25 mcg by mouth daily.    [provider]  carvedilol (COREG) 6.25 MG tablet Take 1 tablet (6.25 mg total) by mouth 2 (two) times daily. 03/26/23   Jonelle Sidle, MD  Cholecalciferol (VITAMIN D) 50 MCG (2000 UT) CAPS Take 2,000 Units by mouth daily.    [provider]  furosemide (LASIX) 20 MG tablet TAKE 1 TABLET BY MOUTH ONCE DAILY, MAY TAKE EXTRA TABLET FOR SWELLING, WEIGHT GAIN OF 2-3 POUNDS OVERNIGHT. 08/22/23   Dyann Kief, PA-C  gabapentin (NEURONTIN) 300 MG capsule Take 1 capsule (300 mg total) by mouth at bedtime. 08/15/23 09/14/23  Rondel Baton, MD  ondansetron (ZOFRAN) 4 MG tablet Take 1  tablet (4 mg total) by mouth every 6 (six) hours. 07/24/23   Carmel Sacramento A, PA-C  pantoprazole (PROTONIX) 40 MG tablet Take 1 tablet (40 mg total) by mouth daily. 08/05/23   Vassie Loll, MD  pravastatin (PRAVACHOL) 40 MG tablet Take 40 mg by mouth daily.    [provider]  predniSONE (DELTASONE) 10 MG tablet Take 2 tablets (20 mg total) by mouth daily. 09/03/23   Netta Corrigan, PA-C  sacubitril-valsartan (ENTRESTO) 24-26 MG Take 1 tablet by mouth 2 (two) times daily. 07/19/23   Jonelle Sidle, MD      Allergies    Penicillins    Review of Systems   Review of Systems  Constitutional:  Negative for appetite change and fatigue.  HENT:  Negative for congestion, ear discharge and sinus pressure.   Eyes:  Negative for discharge.  Respiratory:  Negative for cough.   Cardiovascular:  Positive for syncope. Negative for chest pain.  Gastrointestinal:  Negative for abdominal pain and diarrhea.  Genitourinary:  Negative for frequency and hematuria.  Musculoskeletal:  Negative for back pain.  Skin:  Negative for rash.  Neurological:  Positive for dizziness. Negative for seizures and headaches.  Psychiatric/Behavioral:  Negative for hallucinations.     Physical Exam Updated Vital Signs BP (!) 125/54   Pulse (!) 51   Temp 98.1 F (36.7 C) (Oral)   Resp 14   Ht 6' (1.829 m)  Wt 81.6 kg   SpO2 100%   BMI 24.41 kg/m  Physical Exam Vitals and nursing note reviewed.  Constitutional:      Appearance: He is well-developed.  HENT:     Head: Normocephalic.     Nose: Nose normal.  Eyes:     General: No scleral icterus.    Conjunctiva/sclera: Conjunctivae normal.  Neck:     Thyroid: No thyromegaly.  Cardiovascular:     Rate and Rhythm: Normal rate and regular rhythm.     Heart sounds: No murmur heard.    No friction rub. No gallop.  Pulmonary:     Breath sounds: No stridor. No wheezing or rales.  Chest:     Chest wall: No tenderness.  Abdominal:     General:  There is no distension.     Tenderness: There is no abdominal tenderness. There is no rebound.  Musculoskeletal:        General: Normal range of motion.     Cervical back: Neck supple.  Lymphadenopathy:     Cervical: No cervical adenopathy.  Skin:    Findings: No erythema or rash.  Neurological:     Mental Status: He is alert and oriented to person, place, and time.     Motor: No abnormal muscle tone.     Coordination: Coordination normal.  Psychiatric:        Behavior: Behavior normal.     ED Results / Procedures / Treatments   Labs (all labs ordered are listed, but only abnormal results are displayed) Labs Reviewed  BASIC METABOLIC PANEL - Abnormal; Notable for the following components:      Result Value   Chloride 97 (*)    Glucose, Bld 163 (*)    BUN 43 (*)    Creatinine, Ser 2.18 (*)    GFR, Estimated 30 (*)    All other components within normal limits  CBC - Abnormal; Notable for the following components:   WBC 15.8 (*)    RBC 3.44 (*)    Hemoglobin 10.5 (*)    HCT 33.8 (*)    Platelets 415 (*)    All other components within normal limits  URINALYSIS, ROUTINE W REFLEX MICROSCOPIC - Abnormal; Notable for the following components:   Glucose, UA >=500 (*)    All other components within normal limits  HEPATIC FUNCTION PANEL - Abnormal; Notable for the following components:   Albumin 2.5 (*)    All other components within normal limits  TROPONIN I (HIGH SENSITIVITY) - Abnormal; Notable for the following components:   Troponin I (High Sensitivity) 20 (*)    All other components within normal limits  TROPONIN I (HIGH SENSITIVITY) - Abnormal; Notable for the following components:   Troponin I (High Sensitivity) 22 (*)    All other components within normal limits  CBG MONITORING, ED    EKG None  Radiology DG Chest Port 1 View Result Date: 09/14/2023 CLINICAL DATA:  Weakness EXAM: PORTABLE CHEST 1 VIEW COMPARISON:  08/15/2023 FINDINGS: No consolidation,  pneumothorax or effusion. No edema. Normal cardiopericardial silhouette. Overlapping cardiac leads. IMPRESSION: No acute cardiopulmonary disease. Electronically Signed   By: Karen Kays M.D.   On: 09/14/2023 12:23   CT Head Wo Contrast Result Date: 09/14/2023 CLINICAL DATA:  Headache, neuro deficit EXAM: CT HEAD WITHOUT CONTRAST TECHNIQUE: Contiguous axial images were obtained from the base of the skull through the vertex without intravenous contrast. RADIATION DOSE REDUCTION: This exam was performed according to the departmental dose-optimization program which  includes automated exposure control, adjustment of the mA and/or kV according to patient size and/or use of iterative reconstruction technique. COMPARISON:  CT head October 29 24. FINDINGS: Brain: No evidence of acute infarction, hemorrhage, hydrocephalus, extra-axial collection or mass lesion/mass effect. Vascular: No hyperdense vessel. Skull: No acute fracture. Sinuses/Orbits: Clear sinuses. No acute orbital findings. Remote left medial orbital wall fracture. Other: No mastoid effusions. IMPRESSION: No evidence of acute intracranial abnormality. Electronically Signed   By: Feliberto Harts M.D.   On: 09/14/2023 11:42    Procedures Procedures    Medications Ordered in ED Medications  sodium chloride 0.9 % bolus 500 mL (500 mLs Intravenous New Bag/Given 09/14/23 1042)    ED Course/ Medical Decision Making/ A&P                                 Medical Decision Making Amount and/or Complexity of Data Reviewed Labs: ordered. Radiology: ordered.  This patient presents to the ED for concern of syncope, this involves an extensive number of treatment options, and is a complaint that carries with it a high risk of complications and morbidity.  The differential diagnosis includes dehydration, hypotension   Co morbidities that complicate the patient evaluation  Hypertension, cardiomegaly    Additional history obtained:  Additional  history obtained from patient External records from outside source obtained and reviewed including full records   Lab Tests:  I Ordered, and personally interpreted labs.  The pertinent results include: Glucose 163, hemoglobin 10.5   Imaging Studies ordered:  I ordered imaging studies including CT head and chest x-ray I independently visualized and interpreted imaging which showed negative I agree with the radiologist interpretation   Cardiac Monitoring: / EKG:  The patient was maintained on a cardiac monitor.  I personally viewed and interpreted the cardiac monitored which showed an underlying rhythm of: Normal sinus rhythm   Consultations Obtained:  No consultant   Problem List / ED Course / Critical interventions / Medication management  Syncope and cardiomegaly I ordered medication including normal saline for dehydration Reevaluation of the patient after these medicines showed that the patient improved I have reviewed the patients home medicines and have made adjustments as needed   Social Determinants of Health: None Test / Admission - Considered:  None  Patient with syncope while having a bowel movement.  Patient is mildly dehydrated.  Labs and x-rays unremarkable.  Patient will be discharged home to follow-up with PCP        Final Clinical Impression(s) / ED Diagnoses Final diagnoses:  Syncope and collapse    Rx / DC Orders ED Discharge Orders     None         Bethann Berkshire, MD 09/14/23 1658

## 2023-09-14 NOTE — Discharge Instructions (Signed)
Follow-up with your family doctor in the next couple weeks for recheck 

## 2023-09-14 NOTE — ED Notes (Signed)
Attempted to contact pt's sister Graciella Belton at this time for a ride home no answer.

## 2023-09-14 NOTE — ED Notes (Addendum)
Pt spoke with his sister on the phone, pt reports that his sister, Heath Lark, can be updated on his status

## 2023-09-14 NOTE — ED Triage Notes (Signed)
Pt in from home via [facility], per report pt was at home ambulated to the [facility] with walker on toilet had witnessed LOC lasting estimated 1 min, upon arrival of EMS, pt was lethargic, Alert to self and answered when his name was called, HR initially was 80, Sat s88% on RA, CBG 197, pt received 1mg  Narcan d/t pinpoint pupils with response to intervention, pt A&O x4 post med administration, pt family reports the pt is not taking pain meds, pt in A fib in route

## 2023-09-14 NOTE — ED Notes (Signed)
NT came and informed this RN that pt is unable to tolerate weight bearing activities. EDP notified about situation and was informed pt was still able to be d/c at this time. Pt is currently awaiting convo truck to transfer back to residence.

## 2023-09-14 NOTE — ED Notes (Signed)
Attempted to stand pt up with a 2x staff assist, the patient unable to stand even with the max assist from staff.

## 2023-09-28 ENCOUNTER — Other Ambulatory Visit: Payer: Self-pay

## 2023-09-28 ENCOUNTER — Inpatient Hospital Stay (HOSPITAL_COMMUNITY)
Admission: EM | Admit: 2023-09-28 | Discharge: 2023-10-04 | DRG: 312 | Disposition: A | Discharge status: Skilled Nursing Facility | Payer: 59 | Source: Skilled Nursing Facility | Attending: Internal Medicine | Admitting: Internal Medicine

## 2023-09-28 ENCOUNTER — Encounter (HOSPITAL_COMMUNITY): Payer: Self-pay | Admitting: Emergency Medicine

## 2023-09-28 DIAGNOSIS — R5381 Other malaise: Secondary | ICD-10-CM | POA: Diagnosis present

## 2023-09-28 DIAGNOSIS — I700 Atherosclerosis of aorta: Secondary | ICD-10-CM | POA: Diagnosis present

## 2023-09-28 DIAGNOSIS — Z993 Dependence on wheelchair: Secondary | ICD-10-CM

## 2023-09-28 DIAGNOSIS — I10 Essential (primary) hypertension: Secondary | ICD-10-CM | POA: Diagnosis present

## 2023-09-28 DIAGNOSIS — R262 Difficulty in walking, not elsewhere classified: Secondary | ICD-10-CM | POA: Diagnosis present

## 2023-09-28 DIAGNOSIS — Z87891 Personal history of nicotine dependence: Secondary | ICD-10-CM

## 2023-09-28 DIAGNOSIS — R404 Transient alteration of awareness: Secondary | ICD-10-CM | POA: Diagnosis not present

## 2023-09-28 DIAGNOSIS — I951 Orthostatic hypotension: Principal | ICD-10-CM | POA: Diagnosis present

## 2023-09-28 DIAGNOSIS — R55 Syncope and collapse: Principal | ICD-10-CM | POA: Diagnosis present

## 2023-09-28 DIAGNOSIS — K7460 Unspecified cirrhosis of liver: Secondary | ICD-10-CM | POA: Diagnosis present

## 2023-09-28 DIAGNOSIS — Z8249 Family history of ischemic heart disease and other diseases of the circulatory system: Secondary | ICD-10-CM

## 2023-09-28 DIAGNOSIS — N1832 Chronic kidney disease, stage 3b: Secondary | ICD-10-CM | POA: Diagnosis present

## 2023-09-28 DIAGNOSIS — I130 Hypertensive heart and chronic kidney disease with heart failure and stage 1 through stage 4 chronic kidney disease, or unspecified chronic kidney disease: Secondary | ICD-10-CM | POA: Diagnosis present

## 2023-09-28 DIAGNOSIS — Z8673 Personal history of transient ischemic attack (TIA), and cerebral infarction without residual deficits: Secondary | ICD-10-CM

## 2023-09-28 DIAGNOSIS — E785 Hyperlipidemia, unspecified: Secondary | ICD-10-CM | POA: Diagnosis present

## 2023-09-28 DIAGNOSIS — Z556 Problems related to health literacy: Secondary | ICD-10-CM

## 2023-09-28 DIAGNOSIS — Z7982 Long term (current) use of aspirin: Secondary | ICD-10-CM

## 2023-09-28 DIAGNOSIS — Z751 Person awaiting admission to adequate facility elsewhere: Secondary | ICD-10-CM

## 2023-09-28 DIAGNOSIS — M109 Gout, unspecified: Secondary | ICD-10-CM | POA: Diagnosis present

## 2023-09-28 DIAGNOSIS — E538 Deficiency of other specified B group vitamins: Secondary | ICD-10-CM | POA: Diagnosis present

## 2023-09-28 DIAGNOSIS — I5022 Chronic systolic (congestive) heart failure: Secondary | ICD-10-CM | POA: Diagnosis present

## 2023-09-28 DIAGNOSIS — Z880 Allergy status to penicillin: Secondary | ICD-10-CM

## 2023-09-28 DIAGNOSIS — Z79899 Other long term (current) drug therapy: Secondary | ICD-10-CM

## 2023-09-28 DIAGNOSIS — N183 Chronic kidney disease, stage 3 unspecified: Secondary | ICD-10-CM | POA: Diagnosis present

## 2023-09-28 DIAGNOSIS — Z7952 Long term (current) use of systemic steroids: Secondary | ICD-10-CM

## 2023-09-28 DIAGNOSIS — D631 Anemia in chronic kidney disease: Secondary | ICD-10-CM | POA: Diagnosis present

## 2023-09-28 DIAGNOSIS — I428 Other cardiomyopathies: Secondary | ICD-10-CM | POA: Diagnosis present

## 2023-09-28 NOTE — ED Triage Notes (Signed)
 Pt BIB EMS from home with c/o syncope while using the bathroom, initially unresponsive with EMS. Pt A&O upon arrival here.

## 2023-09-29 ENCOUNTER — Emergency Department (HOSPITAL_COMMUNITY): Payer: 59

## 2023-09-29 ENCOUNTER — Inpatient Hospital Stay (HOSPITAL_COMMUNITY): Payer: 59

## 2023-09-29 ENCOUNTER — Observation Stay (HOSPITAL_COMMUNITY): Payer: 59

## 2023-09-29 DIAGNOSIS — Z8249 Family history of ischemic heart disease and other diseases of the circulatory system: Secondary | ICD-10-CM | POA: Diagnosis not present

## 2023-09-29 DIAGNOSIS — E538 Deficiency of other specified B group vitamins: Secondary | ICD-10-CM | POA: Diagnosis present

## 2023-09-29 DIAGNOSIS — E785 Hyperlipidemia, unspecified: Secondary | ICD-10-CM | POA: Diagnosis present

## 2023-09-29 DIAGNOSIS — Z79899 Other long term (current) drug therapy: Secondary | ICD-10-CM | POA: Diagnosis not present

## 2023-09-29 DIAGNOSIS — Z993 Dependence on wheelchair: Secondary | ICD-10-CM | POA: Diagnosis not present

## 2023-09-29 DIAGNOSIS — N1832 Chronic kidney disease, stage 3b: Secondary | ICD-10-CM | POA: Diagnosis present

## 2023-09-29 DIAGNOSIS — R55 Syncope and collapse: Secondary | ICD-10-CM | POA: Diagnosis present

## 2023-09-29 DIAGNOSIS — Z751 Person awaiting admission to adequate facility elsewhere: Secondary | ICD-10-CM | POA: Diagnosis not present

## 2023-09-29 DIAGNOSIS — Z556 Problems related to health literacy: Secondary | ICD-10-CM | POA: Diagnosis not present

## 2023-09-29 DIAGNOSIS — K7460 Unspecified cirrhosis of liver: Secondary | ICD-10-CM | POA: Diagnosis present

## 2023-09-29 DIAGNOSIS — N183 Chronic kidney disease, stage 3 unspecified: Secondary | ICD-10-CM | POA: Diagnosis not present

## 2023-09-29 DIAGNOSIS — Z87891 Personal history of nicotine dependence: Secondary | ICD-10-CM | POA: Diagnosis not present

## 2023-09-29 DIAGNOSIS — D631 Anemia in chronic kidney disease: Secondary | ICD-10-CM | POA: Diagnosis present

## 2023-09-29 DIAGNOSIS — Z7982 Long term (current) use of aspirin: Secondary | ICD-10-CM | POA: Diagnosis not present

## 2023-09-29 DIAGNOSIS — Z880 Allergy status to penicillin: Secondary | ICD-10-CM | POA: Diagnosis not present

## 2023-09-29 DIAGNOSIS — Z8673 Personal history of transient ischemic attack (TIA), and cerebral infarction without residual deficits: Secondary | ICD-10-CM | POA: Diagnosis not present

## 2023-09-29 DIAGNOSIS — R4182 Altered mental status, unspecified: Secondary | ICD-10-CM

## 2023-09-29 DIAGNOSIS — I951 Orthostatic hypotension: Secondary | ICD-10-CM | POA: Diagnosis present

## 2023-09-29 DIAGNOSIS — I10 Essential (primary) hypertension: Secondary | ICD-10-CM | POA: Diagnosis not present

## 2023-09-29 DIAGNOSIS — Z7952 Long term (current) use of systemic steroids: Secondary | ICD-10-CM | POA: Diagnosis not present

## 2023-09-29 DIAGNOSIS — M109 Gout, unspecified: Secondary | ICD-10-CM | POA: Diagnosis present

## 2023-09-29 DIAGNOSIS — I700 Atherosclerosis of aorta: Secondary | ICD-10-CM | POA: Diagnosis present

## 2023-09-29 DIAGNOSIS — I428 Other cardiomyopathies: Secondary | ICD-10-CM | POA: Diagnosis present

## 2023-09-29 DIAGNOSIS — I5022 Chronic systolic (congestive) heart failure: Secondary | ICD-10-CM | POA: Diagnosis present

## 2023-09-29 DIAGNOSIS — R5381 Other malaise: Secondary | ICD-10-CM | POA: Diagnosis present

## 2023-09-29 DIAGNOSIS — R262 Difficulty in walking, not elsewhere classified: Secondary | ICD-10-CM | POA: Diagnosis present

## 2023-09-29 DIAGNOSIS — I130 Hypertensive heart and chronic kidney disease with heart failure and stage 1 through stage 4 chronic kidney disease, or unspecified chronic kidney disease: Secondary | ICD-10-CM | POA: Diagnosis present

## 2023-09-29 DIAGNOSIS — R404 Transient alteration of awareness: Secondary | ICD-10-CM | POA: Diagnosis present

## 2023-09-29 LAB — CBC
HCT: 32.7 % — ABNORMAL LOW (ref 39.0–52.0)
Hemoglobin: 10.8 g/dL — ABNORMAL LOW (ref 13.0–17.0)
MCH: 30.5 pg (ref 26.0–34.0)
MCHC: 33 g/dL (ref 30.0–36.0)
MCV: 92.4 fL (ref 80.0–100.0)
Platelets: 391 10*3/uL (ref 150–400)
RBC: 3.54 MIL/uL — ABNORMAL LOW (ref 4.22–5.81)
RDW: 12.7 % (ref 11.5–15.5)
WBC: 8.2 10*3/uL (ref 4.0–10.5)
nRBC: 0 % (ref 0.0–0.2)

## 2023-09-29 LAB — COMPREHENSIVE METABOLIC PANEL
ALT: 43 U/L (ref 0–44)
AST: 39 U/L (ref 15–41)
Albumin: 2 g/dL — ABNORMAL LOW (ref 3.5–5.0)
Alkaline Phosphatase: 132 U/L — ABNORMAL HIGH (ref 38–126)
Anion gap: 14 (ref 5–15)
BUN: 17 mg/dL (ref 8–23)
CO2: 27 mmol/L (ref 22–32)
Calcium: 9.4 mg/dL (ref 8.9–10.3)
Chloride: 94 mmol/L — ABNORMAL LOW (ref 98–111)
Creatinine, Ser: 1.65 mg/dL — ABNORMAL HIGH (ref 0.61–1.24)
GFR, Estimated: 43 mL/min — ABNORMAL LOW (ref >60)
Glucose, Bld: 177 mg/dL — ABNORMAL HIGH (ref 70–99)
Potassium: 3.7 mmol/L (ref 3.5–5.1)
Sodium: 135 mmol/L (ref 135–145)
Total Bilirubin: 0.5 mg/dL (ref 0.0–1.2)
Total Protein: 7.2 g/dL (ref 6.5–8.1)

## 2023-09-29 LAB — I-STAT CHEM 8, ED
BUN: 18 mg/dL (ref 8–23)
Calcium, Ion: 1.19 mmol/L (ref 1.15–1.40)
Chloride: 97 mmol/L — ABNORMAL LOW (ref 98–111)
Creatinine, Ser: 1.6 mg/dL — ABNORMAL HIGH (ref 0.61–1.24)
Glucose, Bld: 151 mg/dL — ABNORMAL HIGH (ref 70–99)
HCT: 32 % — ABNORMAL LOW (ref 39.0–52.0)
Hemoglobin: 10.9 g/dL — ABNORMAL LOW (ref 13.0–17.0)
Potassium: 3.7 mmol/L (ref 3.5–5.1)
Sodium: 136 mmol/L (ref 135–145)
TCO2: 28 mmol/L (ref 22–32)

## 2023-09-29 LAB — ECHOCARDIOGRAM COMPLETE
AR max vel: 2.31 cm2
AV Area VTI: 2.58 cm2
AV Area mean vel: 2.46 cm2
AV Mean grad: 2.7 mm[Hg]
AV Peak grad: 5.9 mm[Hg]
Ao pk vel: 1.21 m/s
Area-P 1/2: 2.5 cm2
Height: 72 in
S' Lateral: 3.3 cm
Weight: 2878.33 [oz_av]

## 2023-09-29 LAB — AMMONIA: Ammonia: 12 umol/L (ref 9–35)

## 2023-09-29 LAB — RAPID URINE DRUG SCREEN, HOSP PERFORMED
Amphetamines: NOT DETECTED
Barbiturates: NOT DETECTED
Benzodiazepines: NOT DETECTED
Cocaine: NOT DETECTED
Opiates: NOT DETECTED
Tetrahydrocannabinol: NOT DETECTED

## 2023-09-29 LAB — CBC WITH DIFFERENTIAL/PLATELET
Abs Immature Granulocytes: 0.06 10*3/uL (ref 0.00–0.07)
Basophils Absolute: 0 10*3/uL (ref 0.0–0.1)
Basophils Relative: 0 %
Eosinophils Absolute: 0.3 10*3/uL (ref 0.0–0.5)
Eosinophils Relative: 4 %
HCT: 33 % — ABNORMAL LOW (ref 39.0–52.0)
Hemoglobin: 11 g/dL — ABNORMAL LOW (ref 13.0–17.0)
Immature Granulocytes: 1 %
Lymphocytes Relative: 16 %
Lymphs Abs: 1.3 10*3/uL (ref 0.7–4.0)
MCH: 30.8 pg (ref 26.0–34.0)
MCHC: 33.3 g/dL (ref 30.0–36.0)
MCV: 92.4 fL (ref 80.0–100.0)
Monocytes Absolute: 0.5 10*3/uL (ref 0.1–1.0)
Monocytes Relative: 6 %
Neutro Abs: 6 10*3/uL (ref 1.7–7.7)
Neutrophils Relative %: 73 %
Platelets: 436 10*3/uL — ABNORMAL HIGH (ref 150–400)
RBC: 3.57 MIL/uL — ABNORMAL LOW (ref 4.22–5.81)
RDW: 12.8 % (ref 11.5–15.5)
WBC: 8.1 10*3/uL (ref 4.0–10.5)
nRBC: 0 % (ref 0.0–0.2)

## 2023-09-29 LAB — BASIC METABOLIC PANEL
Anion gap: 9 (ref 5–15)
BUN: 19 mg/dL (ref 8–23)
CO2: 28 mmol/L (ref 22–32)
Calcium: 9 mg/dL (ref 8.9–10.3)
Chloride: 97 mmol/L — ABNORMAL LOW (ref 98–111)
Creatinine, Ser: 1.56 mg/dL — ABNORMAL HIGH (ref 0.61–1.24)
GFR, Estimated: 45 mL/min — ABNORMAL LOW (ref >60)
Glucose, Bld: 143 mg/dL — ABNORMAL HIGH (ref 70–99)
Potassium: 3.8 mmol/L (ref 3.5–5.1)
Sodium: 134 mmol/L — ABNORMAL LOW (ref 135–145)

## 2023-09-29 LAB — URINALYSIS, ROUTINE W REFLEX MICROSCOPIC
Bacteria, UA: NONE SEEN
Bilirubin Urine: NEGATIVE
Glucose, UA: >=500 mg/dL — AB
Hgb urine dipstick: NEGATIVE
Ketones, ur: NEGATIVE mg/dL
Leukocytes,Ua: NEGATIVE
Nitrite: NEGATIVE
Protein, ur: NEGATIVE mg/dL
Specific Gravity, Urine: 1.018 (ref 1.005–1.030)
pH: 5 (ref 5.0–8.0)

## 2023-09-29 LAB — I-STAT VENOUS BLOOD GAS, ED
Acid-Base Excess: 6 mmol/L — ABNORMAL HIGH (ref 0.0–2.0)
Bicarbonate: 31 mmol/L — ABNORMAL HIGH (ref 20.0–28.0)
Calcium, Ion: 1.2 mmol/L (ref 1.15–1.40)
HCT: 32 % — ABNORMAL LOW (ref 39.0–52.0)
Hemoglobin: 10.9 g/dL — ABNORMAL LOW (ref 13.0–17.0)
O2 Saturation: 75 %
Potassium: 3.8 mmol/L (ref 3.5–5.1)
Sodium: 136 mmol/L (ref 135–145)
TCO2: 32 mmol/L (ref 22–32)
pCO2, Ven: 45.1 mm[Hg] (ref 44–60)
pH, Ven: 7.446 — ABNORMAL HIGH (ref 7.25–7.43)
pO2, Ven: 39 mm[Hg] (ref 32–45)

## 2023-09-29 LAB — TSH: TSH: 3.331 u[IU]/mL (ref 0.350–4.500)

## 2023-09-29 LAB — ETHANOL: Alcohol, Ethyl (B): <10 mg/dL (ref <10)

## 2023-09-29 LAB — CBG MONITORING, ED: Glucose-Capillary: 126 mg/dL — ABNORMAL HIGH (ref 70–99)

## 2023-09-29 MED ORDER — PRAVASTATIN SODIUM 40 MG PO TABS
40.0000 mg | ORAL_TABLET | Freq: Every day | ORAL | Status: DC
  Administered 2023-09-29 – 2023-10-04 (×6): 40 mg via ORAL
  Filled 2023-09-29 (×6): qty 1

## 2023-09-29 MED ORDER — PANTOPRAZOLE SODIUM 40 MG PO TBEC
40.0000 mg | DELAYED_RELEASE_TABLET | Freq: Every day | ORAL | Status: DC
  Administered 2023-09-29 – 2023-10-04 (×6): 40 mg via ORAL
  Filled 2023-09-29 (×6): qty 1

## 2023-09-29 MED ORDER — ALLOPURINOL 300 MG PO TABS
150.0000 mg | ORAL_TABLET | Freq: Every day | ORAL | Status: DC
  Administered 2023-09-29 – 2023-10-04 (×6): 150 mg via ORAL
  Filled 2023-09-29 (×4): qty 1
  Filled 2023-09-29: qty 2
  Filled 2023-09-29: qty 1

## 2023-09-29 MED ORDER — ACETAMINOPHEN 650 MG RE SUPP: 650.0000 mg | Freq: Four times a day (QID) | RECTAL | Status: DC | PRN

## 2023-09-29 MED ORDER — ACETAMINOPHEN 325 MG PO TABS: 650.0000 mg | ORAL_TABLET | Freq: Four times a day (QID) | ORAL | Status: DC | PRN

## 2023-09-29 MED ORDER — ENOXAPARIN SODIUM 40 MG/0.4ML IJ SOSY
40.0000 mg | PREFILLED_SYRINGE | INTRAMUSCULAR | Status: DC
  Administered 2023-09-29 – 2023-10-04 (×6): 40 mg via SUBCUTANEOUS
  Filled 2023-09-29 (×6): qty 0.4

## 2023-09-29 MED ORDER — ONDANSETRON HCL 4 MG PO TABS: 4.0000 mg | ORAL_TABLET | Freq: Four times a day (QID) | ORAL | Status: DC | PRN

## 2023-09-29 MED ORDER — ONDANSETRON HCL 4 MG/2ML IJ SOLN
4.0000 mg | Freq: Once | INTRAMUSCULAR | Status: AC
  Administered 2023-09-29: 4 mg via INTRAVENOUS
  Filled 2023-09-29: qty 2

## 2023-09-29 MED ORDER — SODIUM CHLORIDE 0.9% FLUSH
3.0000 mL | Freq: Two times a day (BID) | INTRAVENOUS | Status: DC
  Administered 2023-09-29 – 2023-10-04 (×11): 3 mL via INTRAVENOUS

## 2023-09-29 MED ORDER — ONDANSETRON HCL 4 MG/2ML IJ SOLN: 4.0000 mg | Freq: Four times a day (QID) | INTRAMUSCULAR | Status: DC | PRN

## 2023-09-29 MED ORDER — SODIUM CHLORIDE 0.9 % IV SOLN: INTRAVENOUS | Status: DC

## 2023-09-29 NOTE — ED Notes (Signed)
 Hospitalist at bedside

## 2023-09-29 NOTE — ED Notes (Signed)
 EEG at bedside.

## 2023-09-29 NOTE — H&P (Signed)
 History and Physical    PatientKAHIAU [name] FMW:[id number] DOB: [birth year] DOA: 09/28/2023 DOS: the patient was seen and examined on 09/29/2023 PCP: [facility], [facility]  Patient coming from: Home  Chief Complaint:  Chief Complaint  Patient presents with   Loss of Consciousness   HPI: [name] is a 78 y.o. male with medical history significant of HFrEF (20-25% in 2020 but recovered EF to 50% on echo in 2022) from NICM, HTN, cirrhosis, HCV.  Pt in to ED following sudden onset of AMS: Patient is currently staying with his niece here in [place].  He typically lives in [place] with his sister They were helping him to the bathroom when he sat on the commode and became altered.  He did not fall or hit his head.  No seizures.  Since that time has had intermittent episodes when he stops talking and appears altered.  Pt has gradually returned to baseline here in the ED.  Now denies any complaints.  He had a similar episode last month (once).  Work up in ED was largely unremarkable except for ventricular bigeminy.  Has h/o symptomatic bradycardia in Oct 2023 that resolved when they took him off of metoprolol.     Review of Systems: As mentioned in the history of present illness. All other systems reviewed and are negative. Past Medical History:  Diagnosis Date   Aortic atherosclerosis (HCC)    Cardiomyopathy (HCC)    a. EF 20 to 25% by echo in 03/2019.   Cerebrovascular disease    Chronic HFrEF (heart failure with reduced ejection fraction) (HCC)    Chronic kidney disease, stage 3a (HCC)    Cirrhosis (HCC)    Essential hypertension    Gout    Hepatitis C    Hyperlipidemia    Past Surgical History:  Procedure Laterality Date   HERNIA REPAIR Left    Social History:  reports that he quit smoking about 15 years ago. His smoking use included cigarettes. He has never used smokeless tobacco. He reports that he does not currently use drugs. He reports that he does not  drink alcohol.  Allergies  Allergen Reactions   Penicillins Swelling    Family History  Problem Relation Age of Onset   Hypertension Sister     Prior to Admission medications   Medication Sig Start Date End Date Taking? Authorizing Provider  allopurinol  (ZYLOPRIM ) 100 MG tablet Take 1.5 tablets (150 mg total) by mouth daily. 08/04/23 08/03/24  Ricky Fines, MD  aspirin  EC 81 MG tablet Take 81 mg by mouth daily.    [provider]  calcitRIOL  (ROCALTROL ) 0.25 MCG capsule Take 0.25 mcg by mouth daily.    [provider]  carvedilol  (COREG ) 6.25 MG tablet Take 1 tablet (6.25 mg total) by mouth 2 (two) times daily. 03/26/23   Debera Jayson MATSU, MD  Cholecalciferol  (VITAMIN D ) 50 MCG (2000 UT) CAPS Take 2,000 Units by mouth daily.    [provider]  furosemide  (LASIX ) 20 MG tablet TAKE 1 TABLET BY MOUTH ONCE DAILY, MAY TAKE EXTRA TABLET FOR SWELLING, WEIGHT GAIN OF 2-3 POUNDS OVERNIGHT. 08/22/23   Parthenia Olivia HERO, PA-C  gabapentin  (NEURONTIN ) 300 MG capsule Take 1 capsule (300 mg total) by mouth at bedtime. 08/15/23 09/14/23  Yolande Lamar BROCKS, MD  ondansetron  (ZOFRAN ) 4 MG tablet Take 1 tablet (4 mg total) by mouth every 6 (six) hours. 07/24/23   Beatty, Celeste A, PA-C  pantoprazole  (PROTONIX ) 40 MG tablet Take 1  tablet (40 mg total) by mouth daily. 08/05/23   Ricky Fines, MD  pravastatin  (PRAVACHOL ) 40 MG tablet Take 40 mg by mouth daily.    [provider]  predniSONE  (DELTASONE ) 10 MG tablet Take 2 tablets (20 mg total) by mouth daily. 09/03/23   Victor Lynwood DASEN, PA-C  sacubitril -valsartan  (ENTRESTO ) 24-26 MG Take 1 tablet by mouth 2 (two) times daily. 07/19/23   Debera Jayson MATSU, MD    Physical Exam: Vitals:   09/28/23 2355 09/29/23 0215 09/29/23 0300 09/29/23 0422  BP:  104/70 98/70   Pulse:   75   Resp:  16 15   Temp:    97.8 F (36.6 C)  TempSrc:    Oral  SpO2:   100%   Weight: 81.6 kg     Height: 6' (1.829 m)       Constitutional: NAD, calm, comfortable Respiratory: clear to auscultation bilaterally, no wheezing, no crackles. Normal respiratory effort. No accessory muscle use.  Cardiovascular: Regularly irregular rhythm. Abdomen: no tenderness, no masses palpated. No hepatosplenomegaly. Bowel sounds positive.  Neurologic: CN 2-12 grossly intact. Sensation intact, DTR normal. Strength 5/5 in all 4.  Psychiatric: Normal judgment and insight. Alert and oriented x 3. Normal mood.   Data Reviewed:    Labs on Admission: I have personally reviewed following labs and imaging studies  CBC: Recent Labs  Lab 09/28/23 2356 09/29/23 0039 09/29/23 0050  WBC 8.1  --   --   NEUTROABS 6.0  --   --   HGB 11.0* 10.9* 10.9*  HCT 33.0* 32.0* 32.0*  MCV 92.4  --   --   PLT 436*  --   --    Basic Metabolic Panel: Recent Labs  Lab 09/28/23 2356 09/29/23 0039 09/29/23 0050  NA 135 136 136  K 3.7 3.7 3.8  CL 94* 97*  --   CO2 27  --   --   GLUCOSE 177* 151*  --   BUN 17 18  --   CREATININE 1.65* 1.60*  --   CALCIUM  9.4  --   --    GFR: Estimated Creatinine Clearance: 42.4 mL/min (A) (by C-G formula based on SCr of 1.6 mg/dL (H)). Liver Function Tests: Recent Labs  Lab 09/28/23 2356  AST 39  ALT 43  ALKPHOS 132*  BILITOT 0.5  PROT 7.2  ALBUMIN  2.0*   No results for input(s): LIPASE, AMYLASE in the last 168 hours. Recent Labs  Lab 09/29/23 0032  AMMONIA 12   Coagulation Profile: No results for input(s): INR, PROTIME in the last 168 hours. Cardiac Enzymes: No results for input(s): CKTOTAL, CKMB, CKMBINDEX, TROPONINI in the last 168 hours. BNP (last 3 results) No results for input(s): PROBNP in the last 8760 hours. HbA1C: No results for input(s): HGBA1C in the last 72 hours. CBG: No results for input(s): GLUCAP in the last 168 hours. Lipid Profile: No results for input(s): CHOL, HDL, LDLCALC, TRIG, CHOLHDL, LDLDIRECT in the last 72 hours. Thyroid   Function Tests: No results for input(s): TSH, T4TOTAL, FREET4, T3FREE, THYROIDAB in the last 72 hours. Anemia Panel: No results for input(s): VITAMINB12, FOLATE, FERRITIN, TIBC, IRON, RETICCTPCT in the last 72 hours. Urine analysis:    Component Value Date/Time   COLORURINE YELLOW 09/14/2023 1340   APPEARANCEUR CLEAR 09/14/2023 1340   LABSPEC 1.007 09/14/2023 1340   PHURINE 6.0 09/14/2023 1340   GLUCOSEU >=500 (A) 09/14/2023 1340   HGBUR NEGATIVE 09/14/2023 1340   BILIRUBINUR NEGATIVE 09/14/2023 1340   KETONESUR NEGATIVE  09/14/2023 1340   PROTEINUR NEGATIVE 09/14/2023 1340   UROBILINOGEN 1.0 02/01/2012 1300   NITRITE NEGATIVE 09/14/2023 1340   LEUKOCYTESUR NEGATIVE 09/14/2023 1340    Radiological Exams on Admission: DG Chest Port 1 View Result Date: 09/29/2023 CLINICAL DATA:  Altered mental status EXAM: PORTABLE CHEST 1 VIEW COMPARISON:  09/14/2023 FINDINGS: The heart size and mediastinal contours are within normal limits. Both lungs are clear. The visualized skeletal structures are unremarkable. IMPRESSION: No active disease. Electronically Signed   By: Oneil Devonshire M.D.   On: 09/29/2023 01:53   CT HEAD WO CONTRAST Result Date: 09/29/2023 CLINICAL DATA:  Delirium EXAM: CT HEAD WITHOUT CONTRAST TECHNIQUE: Contiguous axial images were obtained from the base of the skull through the vertex without intravenous contrast. RADIATION DOSE REDUCTION: This exam was performed according to the departmental dose-optimization program which includes automated exposure control, adjustment of the mA and/or kV according to patient size and/or use of iterative reconstruction technique. COMPARISON:  CT head 09/14/2023. FINDINGS: Brain: No evidence of acute infarction, hemorrhage, hydrocephalus, extra-axial collection or mass lesion/mass effect. Vascular: No hyperdense vessel identified. Skull: No acute fracture. Sinuses/Orbits: Remote left medial orbital wall fracture. No acute orbital  findings. Clear sinuses. Other: No mastoid effusions. IMPRESSION: Stable head CT.  No evidence of acute intracranial abnormality. Electronically Signed   By: Gilmore GORMAN Molt M.D.   On: 09/29/2023 00:50    EKG: Independently reviewed.   Assessment and Plan: * Syncope Syncope pathway Tele monitor 2d echo EEG If above work up negative, then think he probably should get a Zio patch / ambulatory monitoring as next step.  NICM (nonischemic cardiomyopathy) (HCC) Pt with h/o NICM, EF was 20-25% in 2020, but had recovered to 50% by 2022 (time of last echo). Cont coreg  Cont Entresto  Cont Lasix  Getting 2d echo as above  CKD (chronic kidney disease) stage 3, GFR 30-59 ml/min (HCC) Creat 1.6 today, looks like this is patients baseline over the past year.  Hypertension Cont home BP meds when med rec completed.      Advance Care Planning:   Code Status: Full Code  Consults: None  Family Communication: Family at bedside  Severity of Illness: The appropriate patient status for this patient is OBSERVATION. Observation status is judged to be reasonable and necessary in order to provide the required intensity of service to ensure the patient's safety. The patient's presenting symptoms, physical exam findings, and initial radiographic and laboratory data in the context of their medical condition is felt to place them at decreased risk for further clinical deterioration. Furthermore, it is anticipated that the patient will be medically stable for discharge from the hospital within 2 midnights of admission.   Author: Yezenia Fredrick M., DO 09/29/2023 4:29 AM  For on call review [email or web address].

## 2023-09-29 NOTE — ED Notes (Signed)
 Patient transported to CT

## 2023-09-29 NOTE — ED Notes (Signed)
 Patient cleaned and blanket given.

## 2023-09-29 NOTE — ED Provider Notes (Signed)
 [place] [facility] AT [facility] Provider Note   CSN: [id number] Arrival date & time: 09/28/23  2348     History  Chief Complaint  Patient presents with   Loss of Consciousness    Don [name] is a 78 y.o. male.  The history is provided by the patient and a relative.  Patient with history of CVA, heart failure, chronic kidney disease, cirrhosis presents for altered mental status Patient is currently staying with his niece here in [place].  He typically lives in [place] with his sister They were helping him to the bathroom when he sat on the commode and became altered.  He did not fall or hit his head.  No seizures.  Since that time has had intermittent episodes when he stops talking and appears altered.  Patient denies any complaints at this time. He has similar episode last month Per family, he has required assistance to the bathroom over the past week due to gout in his feet   Past Medical History:  Diagnosis Date   Aortic atherosclerosis (HCC)    Cardiomyopathy (HCC)    a. EF 20 to 25% by echo in 03/2019.   Cerebrovascular disease    Chronic HFrEF (heart failure with reduced ejection fraction) (HCC)    Chronic kidney disease, stage 3a (HCC)    Cirrhosis (HCC)    Essential hypertension    Gout    Hepatitis C    Hyperlipidemia     Home Medications Prior to Admission medications   Medication Sig Start Date End Date Taking? Authorizing Provider  allopurinol  (ZYLOPRIM ) 100 MG tablet Take 1.5 tablets (150 mg total) by mouth daily. 08/04/23 08/03/24  Ricky Fines, MD  aspirin  EC 81 MG tablet Take 81 mg by mouth daily.    [provider]  calcitRIOL  (ROCALTROL ) 0.25 MCG capsule Take 0.25 mcg by mouth daily.    [provider]  carvedilol  (COREG ) 6.25 MG tablet Take 1 tablet (6.25 mg total) by mouth 2 (two) times daily. 03/26/23   Debera Jayson MATSU, MD  Cholecalciferol  (VITAMIN D ) 50 MCG (2000 UT) CAPS Take 2,000 Units by  mouth daily.    [provider]  furosemide  (LASIX ) 20 MG tablet TAKE 1 TABLET BY MOUTH ONCE DAILY, MAY TAKE EXTRA TABLET FOR SWELLING, WEIGHT GAIN OF 2-3 POUNDS OVERNIGHT. 08/22/23   Parthenia Olivia HERO, PA-C  gabapentin  (NEURONTIN ) 300 MG capsule Take 1 capsule (300 mg total) by mouth at bedtime. 08/15/23 09/14/23  Yolande Lamar BROCKS, MD  ondansetron  (ZOFRAN ) 4 MG tablet Take 1 tablet (4 mg total) by mouth every 6 (six) hours. 07/24/23   Suellen Cantor A, PA-C  pantoprazole  (PROTONIX ) 40 MG tablet Take 1 tablet (40 mg total) by mouth daily. 08/05/23   Ricky Fines, MD  pravastatin  (PRAVACHOL ) 40 MG tablet Take 40 mg by mouth daily.    [provider]  predniSONE  (DELTASONE ) 10 MG tablet Take 2 tablets (20 mg total) by mouth daily. 09/03/23   Victor Lynwood DASEN, PA-C  sacubitril -valsartan  (ENTRESTO ) 24-26 MG Take 1 tablet by mouth 2 (two) times daily. 07/19/23   Debera Jayson MATSU, MD      Allergies    Penicillins    Review of Systems   Review of Systems  Constitutional:  Negative for fever.  Gastrointestinal:  Negative for vomiting.    Physical Exam Updated Vital Signs BP 98/70   Pulse 75   Temp 97.6 F (36.4 C)   Resp 15   Ht 1.829 m (  6')   Wt 81.6 kg   SpO2 100%   BMI 24.40 kg/m  Physical Exam CONSTITUTIONAL: Chronically ill-appearing, somnolent HEAD: Normocephalic/atraumatic EYES: Pupils are equal and reactive.  Brief episodes of nystagmus noted horizontally ENMT: Mucous membranes moist NECK: supple no meningeal signs CV: S1/S2 noted, no murmurs/rubs/gallops noted LUNGS: Lungs are clear to auscultation bilaterally, no apparent distress ABDOMEN: soft, nontender NEURO: Pt is somnolent but arousable, he follows all commands and moves all 4 extremities.  No arm or leg drift  no facial droop.  He answers questions appropriately EXTREMITIES: pulses normal/equal, full ROM, no deformities SKIN: warm, color normal   ED Results / Procedures / Treatments    Labs (all labs ordered are listed, but only abnormal results are displayed) Labs Reviewed  CBC WITH DIFFERENTIAL/PLATELET - Abnormal; Notable for the following components:      Result Value   RBC 3.57 (*)    Hemoglobin 11.0 (*)    HCT 33.0 (*)    Platelets 436 (*)    All other components within normal limits  COMPREHENSIVE METABOLIC PANEL - Abnormal; Notable for the following components:   Chloride 94 (*)    Glucose, Bld 177 (*)    Creatinine, Ser 1.65 (*)    Albumin  2.0 (*)    Alkaline Phosphatase 132 (*)    GFR, Estimated 43 (*)    All other components within normal limits  I-STAT CHEM 8, ED - Abnormal; Notable for the following components:   Chloride 97 (*)    Creatinine, Ser 1.60 (*)    Glucose, Bld 151 (*)    Hemoglobin 10.9 (*)    HCT 32.0 (*)    All other components within normal limits  I-STAT VENOUS BLOOD GAS, ED - Abnormal; Notable for the following components:   pH, Ven 7.446 (*)    Bicarbonate 31.0 (*)    Acid-Base Excess 6.0 (*)    HCT 32.0 (*)    Hemoglobin 10.9 (*)    All other components within normal limits  ETHANOL  AMMONIA  RAPID URINE DRUG SCREEN, HOSP PERFORMED  URINALYSIS, ROUTINE W REFLEX MICROSCOPIC  RAPID URINE DRUG SCREEN, HOSP PERFORMED  CBG MONITORING, ED    EKG EKG Interpretation Date/Time:  Saturday September 29 2023 00:34:24 EST Ventricular Rate:  71 PR Interval:  48 QRS Duration:  117 QT Interval:  423 QTC Calculation: 460 R Axis:   10  Text Interpretation: Sinus rhythm Short PR interval Nonspecific intraventricular conduction delay Probable anteroseptal infarct, old Confirmed by Midge Golas ([id number]) on 09/29/2023 12:42:00 AM  Radiology DG Chest Port 1 View Result Date: 09/29/2023 CLINICAL DATA:  Altered mental status EXAM: PORTABLE CHEST 1 VIEW COMPARISON:  09/14/2023 FINDINGS: The heart size and mediastinal contours are within normal limits. Both lungs are clear. The visualized skeletal structures are unremarkable. IMPRESSION:  No active disease. Electronically Signed   By: Oneil Devonshire M.D.   On: 09/29/2023 01:53   CT HEAD WO CONTRAST Result Date: 09/29/2023 CLINICAL DATA:  Delirium EXAM: CT HEAD WITHOUT CONTRAST TECHNIQUE: Contiguous axial images were obtained from the base of the skull through the vertex without intravenous contrast. RADIATION DOSE REDUCTION: This exam was performed according to the departmental dose-optimization program which includes automated exposure control, adjustment of the mA and/or kV according to patient size and/or use of iterative reconstruction technique. COMPARISON:  CT head 09/14/2023. FINDINGS: Brain: No evidence of acute infarction, hemorrhage, hydrocephalus, extra-axial collection or mass lesion/mass effect. Vascular: No hyperdense vessel identified. Skull: No acute fracture. Sinuses/Orbits:  Remote left medial orbital wall fracture. No acute orbital findings. Clear sinuses. Other: No mastoid effusions. IMPRESSION: Stable head CT.  No evidence of acute intracranial abnormality. Electronically Signed   By: Gilmore GORMAN Molt M.D.   On: 09/29/2023 00:50    Procedures Procedures    Medications Ordered in ED Medications  ondansetron  (ZOFRAN ) injection 4 mg (4 mg Intravenous Given 09/29/23 0033)    ED Course/ Medical Decision Making/ A&P Clinical Course as of 09/29/23 0349  Sat Sep 29, 2023  0043 Creatinine(!): 1.60 Renal insufficiency [DW]  0043 Hemoglobin(!): 11.0 Chronic anemia [DW]  0235 Patient appears awake alert and improved.  Overall workup near his baseline.  However due to repeat episode as he had similar episode prior to the holidays. Will be admitted for alteration mental status/syncope  [DW]  [phone] Discussed with Dr. [name] for admission [DW]    Clinical Course User Index [DW] Midge Golas, MD                                 Medical Decision Making Amount and/or Complexity of Data Reviewed Labs: ordered. Decision-making details documented in ED  Course. Radiology: ordered. ECG/medicine tests: ordered.  Risk Prescription drug management. Decision regarding hospitalization.   This patient presents to the ED for concern of weakness and altered mental status, this involves an extensive number of treatment options, and is a complaint that carries with it a high risk of complications and morbidity.  The differential diagnosis includes but is not limited to CVA, intracranial hemorrhage, acute coronary syndrome, renal failure, urinary tract infection, electrolyte disturbance, pneumonia     Comorbidities that complicate the patient evaluation: Patient's presentation is complicated by their history of CHF  Social Determinants of Health: Patient's  poor health literacy   increases the complexity of managing their presentation  Additional history obtained: Additional history obtained from family Records reviewed previous admission documents  Lab Tests: I Ordered, and personally interpreted labs.  The pertinent results include: Anemia, chronic kidney disease  Imaging Studies ordered: I ordered imaging studies including CT scan head and X-ray chest   I independently visualized and interpreted imaging which showed no acute findings I agree with the radiologist interpretation  Cardiac Monitoring: The patient was maintained on a cardiac monitor.  I personally viewed and interpreted the cardiac monitor which showed an underlying rhythm of:  sinus rhythm   Critical Interventions:   admission and monitoring  Consultations Obtained: I requested consultation with the admitting physician [facility] , and discussed  findings as well as pertinent plan - they recommend: Will admit  Reevaluation: After the interventions noted above, I reevaluated the patient and found that they have :improved  Complexity of problems addressed: Patient's presentation is most consistent with  acute presentation with potential threat to life or bodily  function  Disposition: After consideration of the diagnostic results and the patient's response to treatment,  I feel that the patent would benefit from admission   .           Final Clinical Impression(s) / ED Diagnoses Final diagnoses:  Syncope and collapse  Transient alteration of awareness    Rx / DC Orders ED Discharge Orders     None         Midge Golas, MD 09/29/23 [phone]

## 2023-09-29 NOTE — Assessment & Plan Note (Signed)
 Creat 1.6 today, looks like this is patients baseline over the past year.

## 2023-09-29 NOTE — Progress Notes (Signed)
 Echocardiogram 2D Echocardiogram has been performed.  Lucendia Herrlich 09/29/2023, 4:36 PM

## 2023-09-29 NOTE — Progress Notes (Signed)
 PROGRESS NOTE  Don [name]  FMW:[id number] DOB: 12-03-1945 DOA: 09/28/2023 PCP: [facility]   Brief Narrative: Patient is a 78 year old male with history of HFrEF, hypertension, cirrhosis, HCV who presented with acute onset of confusion and loss of consciousness.  Patient returned to baseline mentation in the emergency department.  Had similar episode last month.  Admitted for syncopal workup.  Found to be orthostatic, started on IV fluids  Assessment & Plan:  Principal Problem:   Syncope Active Problems:   NICM (nonischemic cardiomyopathy) (HCC)   Hypertension   CKD (chronic kidney disease) stage 3, GFR 30-59 ml/min (HCC)  Syncope/confusion/orthostatic hypotension: Had similar episode about a month ago.  He was at baseline mentation and living with her sister, suddenly became altered and became briefly unconscious.  CT did not show any acute findings.  Currently alert and oriented. Admitted for syncopal workup.  EEG negative for seizure or epileptiform discharges.  Echo pending.  PT  consulted.  Found to be orthostatic, started on IV fluids Depending upon the echo report, we may need to communicate with cardiology for arranging a Zio patch before discharge. EKG shows normal sinus rhythm, nonspecific interventricular conduction delay.  EKG monitor in the room shows frequent PVCs.  Coreg  on hold but may need to resume on discharge  History of nonischemic cardiomyopathy/HFrEF: Currently euvolemic.  Echo on 2020 showed EF of 20 to 25% which improved to 50% as per echo on 1222.  On Coreg , Entresto , Lasix .  These meds are on hold.  We recommend to follow-up with cardiology as an outpatient  CKD stage IIIb: Currently kidney function at baseline  Hypertension: Currently normotensive.  Home medications on hold.    History of hyperlipidemia: Pravastatin  at home.  Debility/deconditioning: Patient is mostly nonambulatory, which is wheelchair.  Appears deconditioned.  PT/OT  consulted.  Recommend acute  inpatient rehab         DVT prophylaxis:enoxaparin  (LOVENOX ) injection 40 mg Start: 09/29/23 1000     Code Status: Full Code  Family Communication: None at bedside  Patient status:Inpatient  Patient is from :Home  Anticipated discharge to: Acute inpatient rehab versus SNF  Estimated DC date:2-3 days   Consultants: None  Procedures:None  Antimicrobials:  Anti-infectives (From admission, onward)    None       Subjective: Patient seen and examined the bedside today.  Hemodynamically stable.  Lying in bed.  Appears very weak and deconditioned.  Blood pressure soft but stable.  EKG monitor showed frequent PVCs.  He is mostly oriented.  PT assessed him and found to be orthostatic.  Objective: Vitals:   09/29/23 0715 09/29/23 0730 09/29/23 0800 09/29/23 0805  BP: (!) 104/57 112/60 139/78   Pulse: 65 65    Resp: 13 14 20    Temp:    97.9 F (36.6 C)  TempSrc:    Oral  SpO2: 94% 94% 99%   Weight:      Height:       No intake or output data in the 24 hours ending 09/29/23 0912 Filed Weights   09/28/23 2355  Weight: 81.6 kg    Examination:  General exam: Overall comfortable, not in distress, weak/deconditioned HEENT: PERRL Respiratory system:  no wheezes or crackles  Cardiovascular system: S1 & S2 heard, RRR.  Gastrointestinal system: Abdomen is nondistended, soft and nontender. Central nervous system: Alert and oriented Extremities: No edema, no clubbing ,no cyanosis Skin: No rashes, no ulcers,no icterus     Data Reviewed: I have personally reviewed  following labs and imaging studies  CBC: Recent Labs  Lab 09/28/23 2356 09/29/23 0039 09/29/23 0050 09/29/23 0419  WBC 8.1  --   --  8.2  NEUTROABS 6.0  --   --   --   HGB 11.0* 10.9* 10.9* 10.8*  HCT 33.0* 32.0* 32.0* 32.7*  MCV 92.4  --   --  92.4  PLT 436*  --   --  391   Basic Metabolic Panel: Recent Labs  Lab 09/28/23 2356 09/29/23 0039 09/29/23 0050  09/29/23 0419  NA 135 136 136 134*  K 3.7 3.7 3.8 3.8  CL 94* 97*  --  97*  CO2 27  --   --  28  GLUCOSE 177* 151*  --  143*  BUN 17 18  --  19  CREATININE 1.65* 1.60*  --  1.56*  CALCIUM  9.4  --   --  9.0     No results found for this or any previous visit (from the past 240 hours).   Radiology Studies: EEG adult Result Date: 09/29/2023 [name] Don KIDD, MD     09/29/2023  7:19 AM Patient Name: Don [name] MRN: [id number] Epilepsy Attending: Arlin KIDD [name] Referring Physician/Provider: [name] Emeline HERO, DO Date: 09/29/2023 Duration: 23.04 mins Patient history: 78yo M with ams getting eeg to evaluate for seizure Level of alertness: Awake, asleep AEDs during EEG study: None Technical aspects: This EEG study was done with scalp electrodes positioned according to the 10-20 International system of electrode placement. Electrical activity was reviewed with band pass filter of 1-70Hz , sensitivity of 7 uV/mm, display speed of 15mm/sec with a 60Hz  notched filter applied as appropriate. EEG data were recorded continuously and digitally stored.  Video monitoring was available and reviewed as appropriate. Description: The posterior dominant rhythm consists of 8-9  Hz activity of moderate voltage (25-35 uV) seen predominantly in posterior head regions, symmetric and reactive to eye opening and eye closing. Sleep was characterized by vertex waves, sleep spindles (12 to 14 Hz), maximal frontocentral region. Hyperventilation and photic stimulation were not performed.   IMPRESSION: This study is within normal limits. No seizures or epileptiform discharges were seen throughout the recording. A normal interictal EEG does not exclude the diagnosis of epilepsy. Don [name]   DG Chest Port 1 View Result Date: 09/29/2023 CLINICAL DATA:  Altered mental status EXAM: PORTABLE CHEST 1 VIEW COMPARISON:  09/14/2023 FINDINGS: The heart size and mediastinal contours are within normal limits. Both lungs are clear. The  visualized skeletal structures are unremarkable. IMPRESSION: No active disease. Electronically Signed   By: Don [name] M.D.   On: 09/29/2023 01:53   CT HEAD WO CONTRAST Result Date: 09/29/2023 CLINICAL DATA:  Delirium EXAM: CT HEAD WITHOUT CONTRAST TECHNIQUE: Contiguous axial images were obtained from the base of the skull through the vertex without intravenous contrast. RADIATION DOSE REDUCTION: This exam was performed according to the departmental dose-optimization program which includes automated exposure control, adjustment of the mA and/or kV according to patient size and/or use of iterative reconstruction technique. COMPARISON:  CT head 09/14/2023. FINDINGS: Brain: No evidence of acute infarction, hemorrhage, hydrocephalus, extra-axial collection or mass lesion/mass effect. Vascular: No hyperdense vessel identified. Skull: No acute fracture. Sinuses/Orbits: Remote left medial orbital wall fracture. No acute orbital findings. Clear sinuses. Other: No mastoid effusions. IMPRESSION: Stable head CT.  No evidence of acute intracranial abnormality. Electronically Signed   By: Gilmore GORMAN Molt M.D.   On: 09/29/2023 00:50    Scheduled Meds:  enoxaparin  (  LOVENOX ) injection  40 mg Subcutaneous Q24H   sodium chloride  flush  3 mL Intravenous Q12H   Continuous Infusions:   LOS: 0 days   Ivonne Mustache, MD [facility] P1/12/2023, 9:12 AM

## 2023-09-29 NOTE — ED Notes (Signed)
Pt at bedside eating lunch tray 

## 2023-09-29 NOTE — Progress Notes (Signed)
 EEG complete - results pending

## 2023-09-29 NOTE — Procedures (Signed)
 Patient Name: Don [name]  MRN: [id number]  Epilepsy Attending: Arlin MALVA Krebs  Referring Physician/Provider: [name] Emeline HERO, DO  Date: 09/29/2023 Duration: 23.04 mins  Patient history: 78yo M with ams getting eeg to evaluate for seizure  Level of alertness: Awake, asleep  AEDs during EEG study: None  Technical aspects: This EEG study was done with scalp electrodes positioned according to the 10-20 International system of electrode placement. Electrical activity was reviewed with band pass filter of 1-70Hz , sensitivity of 7 uV/mm, display speed of 52mm/sec with a 60Hz  notched filter applied as appropriate. EEG data were recorded continuously and digitally stored.  Video monitoring was available and reviewed as appropriate.  Description: The posterior dominant rhythm consists of 8-9  Hz activity of moderate voltage (25-35 uV) seen predominantly in posterior head regions, symmetric and reactive to eye opening and eye closing. Sleep was characterized by vertex waves, sleep spindles (12 to 14 Hz), maximal frontocentral region. Hyperventilation and photic stimulation were not performed.     IMPRESSION: This study is within normal limits. No seizures or epileptiform discharges were seen throughout the recording.  A normal interictal EEG does not exclude the diagnosis of epilepsy.  [name] O Anna Livers

## 2023-09-29 NOTE — Assessment & Plan Note (Addendum)
 Syncope pathway Tele monitor 2d echo EEG Check TSH If above work up negative, then think he probably should get a Zio patch / ambulatory monitoring as next step.

## 2023-09-29 NOTE — Assessment & Plan Note (Addendum)
Cont home BP meds when med rec completed. 

## 2023-09-29 NOTE — ED Notes (Signed)
 Pedal pulses assessed with doppler. Sites marked.

## 2023-09-29 NOTE — Care Management (Signed)
 Attempted MOON notice to sister who is NOK, as patient is confused. Left VM to [phone] to [id number] call.

## 2023-09-29 NOTE — ED Notes (Signed)
 ED TO INPATIENT HANDOFF REPORT  ED Nurse Name and Phone #:   Donnice [phone]  S Name/Age/Gender Don [name] 78 y.o. male Room/Bed: 001C/001C  Code Status   Code Status: Full Code  Home/SNF/Other Home Patient oriented to: self, place, situation  Is this baseline? No   Triage Complete: Triage complete  Chief Complaint Syncope [R55]  Triage Note Pt BIB EMS from home with c/o syncope while using the bathroom, initially unresponsive with EMS. Pt A&O upon arrival here.    Allergies Allergies  Allergen Reactions   Penicillins Swelling    Level of Care/Admitting Diagnosis ED Disposition     ED Disposition  Admit   Condition  --   Comment  Hospital Area: [facility] [[id number]]  Level of Care: Telemetry Medical [104]  May admit patient to [facility] or [facility] if equivalent level of care is available:: No  Covid Evaluation: Asymptomatic - no recent exposure (last 10 days) testing not required  Diagnosis: Syncope [206001]  Admitting Physician: [name] EMELINE HERO [[id number]]  Attending Physician: ADHIKARI, AMRIT [[id number]]  Certification:: I certify this patient will need inpatient services for at least 2 midnights          B Medical/Surgery History Past Medical History:  Diagnosis Date   Aortic atherosclerosis (HCC)    Cardiomyopathy (HCC)    a. EF 20 to 25% by echo in 03/2019.   Cerebrovascular disease    Chronic HFrEF (heart failure with reduced ejection fraction) (HCC)    Chronic kidney disease, stage 3a (HCC)    Cirrhosis (HCC)    Essential hypertension    Gout    Hepatitis C    Hyperlipidemia    Past Surgical History:  Procedure Laterality Date   HERNIA REPAIR Left      A IV Location/Drains/Wounds Patient Lines/Drains/Airways Status     Active Line/Drains/Airways     Name Placement date Placement time Site Days   Peripheral IV 09/28/23 18 G Left Antecubital 09/28/23  2356  Antecubital  1            Intake/Output  Last 24 hours No intake or output data in the 24 hours ending 09/29/23 1732  Labs/Imaging Results for orders placed or performed during the hospital encounter of 09/28/23 (from the past 48 hours)  Ethanol     Status: None   Collection Time: 09/28/23 11:56 PM  Result Value Ref Range   Alcohol, Ethyl (B) <10 <10 mg/dL    Comment: (NOTE) Lowest detectable limit for serum alcohol is 10 mg/dL.  For medical purposes only. Performed at [facility], [address], [place], [place] [address]   CBC with Differential     Status: Abnormal   Collection Time: 09/28/23 11:56 PM  Result Value Ref Range   WBC 8.1 4.0 - 10.5 K/uL   RBC 3.57 (L) 4.22 - 5.81 MIL/uL   Hemoglobin 11.0 (L) 13.0 - 17.0 g/dL   HCT 66.9 (L) 60.9 - 47.9 %   MCV 92.4 80.0 - 100.0 fL   MCH 30.8 26.0 - 34.0 pg   MCHC 33.3 30.0 - 36.0 g/dL   RDW 87.1 88.4 - 84.4 %   Platelets 436 (H) 150 - 400 K/uL   nRBC 0.0 0.0 - 0.2 %   Neutrophils Relative % 73 %   Neutro Abs 6.0 1.7 - 7.7 K/uL   Lymphocytes Relative 16 %   Lymphs Abs 1.3 0.7 - 4.0 K/uL   Monocytes Relative 6 %  Monocytes Absolute 0.5 0.1 - 1.0 K/uL   Eosinophils Relative 4 %   Eosinophils Absolute 0.3 0.0 - 0.5 K/uL   Basophils Relative 0 %   Basophils Absolute 0.0 0.0 - 0.1 K/uL   Immature Granulocytes 1 %   Abs Immature Granulocytes 0.06 0.00 - 0.07 K/uL    Comment: Performed at [facility], [address], [place], [place] [address]  Comprehensive metabolic panel     Status: Abnormal   Collection Time: 09/28/23 11:56 PM  Result Value Ref Range   Sodium 135 135 - 145 mmol/L   Potassium 3.7 3.5 - 5.1 mmol/L   Chloride 94 (L) 98 - 111 mmol/L   CO2 27 22 - 32 mmol/L   Glucose, Bld 177 (H) 70 - 99 mg/dL    Comment: Glucose reference range applies only to samples taken after fasting for at least 8 hours.   BUN 17 8 - 23 mg/dL   Creatinine, Ser 8.34 (H) 0.61 - 1.24 mg/dL   Calcium  9.4 8.9 - 10.3 mg/dL   Total Protein 7.2 6.5 - 8.1 g/dL    Albumin  2.0 (L) 3.5 - 5.0 g/dL   AST 39 15 - 41 U/L   ALT 43 0 - 44 U/L   Alkaline Phosphatase 132 (H) 38 - 126 U/L   Total Bilirubin 0.5 0.0 - 1.2 mg/dL   GFR, Estimated 43 (L) >60 mL/min    Comment: (NOTE) Calculated using the CKD-EPI Creatinine Equation (2021)    Anion gap 14 5 - 15    Comment: Performed at [facility], [address], [place], [place] [address]  Ammonia     Status: None   Collection Time: 09/29/23 12:32 AM  Result Value Ref Range   Ammonia 12 9 - 35 umol/L    Comment: Performed at [facility], [address], [place], [place] [address]  I-stat chem 8, ED (not at [facility], [facility] or [facility])     Status: Abnormal   Collection Time: 09/29/23 12:39 AM  Result Value Ref Range   Sodium 136 135 - 145 mmol/L   Potassium 3.7 3.5 - 5.1 mmol/L   Chloride 97 (L) 98 - 111 mmol/L   BUN 18 8 - 23 mg/dL   Creatinine, Ser 8.39 (H) 0.61 - 1.24 mg/dL   Glucose, Bld 848 (H) 70 - 99 mg/dL    Comment: Glucose reference range applies only to samples taken after fasting for at least 8 hours.   Calcium , Ion 1.19 1.15 - 1.40 mmol/L   TCO2 28 22 - 32 mmol/L   Hemoglobin 10.9 (L) 13.0 - 17.0 g/dL   HCT 67.9 (L) 60.9 - 47.9 %  I-Stat venous blood gas, ([facility] ED, [facility], [facility])     Status: Abnormal   Collection Time: 09/29/23 12:50 AM  Result Value Ref Range   pH, Ven 7.446 (H) 7.25 - 7.43   pCO2, Ven 45.1 44 - 60 mmHg   pO2, Ven 39 32 - 45 mmHg   Bicarbonate 31.0 (H) 20.0 - 28.0 mmol/L   TCO2 32 22 - 32 mmol/L   O2 Saturation 75 %   Acid-Base Excess 6.0 (H) 0.0 - 2.0 mmol/L   Sodium 136 135 - 145 mmol/L   Potassium 3.8 3.5 - 5.1 mmol/L   Calcium , Ion 1.20 1.15 - 1.40 mmol/L   HCT 32.0 (L) 39.0 - 52.0 %   Hemoglobin 10.9 (L) 13.0 - 17.0 g/dL   Sample type VENOUS    Comment  NOTIFIED PHYSICIAN   CBC     Status: Abnormal   Collection Time: 09/29/23  4:19 AM  Result Value Ref Range   WBC 8.2 4.0 - 10.5 K/uL   RBC 3.54 (L) 4.22 - 5.81 MIL/uL   Hemoglobin 10.8 (L) 13.0 - 17.0 g/dL    HCT 67.2 (L) 60.9 - 52.0 %   MCV 92.4 80.0 - 100.0 fL   MCH 30.5 26.0 - 34.0 pg   MCHC 33.0 30.0 - 36.0 g/dL   RDW 87.2 88.4 - 84.4 %   Platelets 391 150 - 400 K/uL   nRBC 0.0 0.0 - 0.2 %    Comment: Performed at [facility], [address], [place], [place] [address]  Basic metabolic panel     Status: Abnormal   Collection Time: 09/29/23  4:19 AM  Result Value Ref Range   Sodium 134 (L) 135 - 145 mmol/L   Potassium 3.8 3.5 - 5.1 mmol/L   Chloride 97 (L) 98 - 111 mmol/L   CO2 28 22 - 32 mmol/L   Glucose, Bld 143 (H) 70 - 99 mg/dL    Comment: Glucose reference range applies only to samples taken after fasting for at least 8 hours.   BUN 19 8 - 23 mg/dL   Creatinine, Ser 8.43 (H) 0.61 - 1.24 mg/dL   Calcium  9.0 8.9 - 10.3 mg/dL   GFR, Estimated 45 (L) >60 mL/min    Comment: (NOTE) Calculated using the CKD-EPI Creatinine Equation (2021)    Anion gap 9 5 - 15    Comment: Performed at [facility], [address] [place], [place] [address]  TSH     Status: None   Collection Time: 09/29/23  4:19 AM  Result Value Ref Range   TSH 3.331 0.350 - 4.500 uIU/mL    Comment: Performed by a 3rd Generation assay with a functional sensitivity of <=0.01 uIU/mL. Performed at [facility], [address] [place], [place] [address]   CBG monitoring, ED     Status: Abnormal   Collection Time: 09/29/23  5:06 AM  Result Value Ref Range   Glucose-Capillary 126 (H) 70 - 99 mg/dL    Comment: Glucose reference range applies only to samples taken after fasting for at least 8 hours.  Urinalysis, Routine w reflex microscopic -Urine, Clean Catch     Status: Abnormal   Collection Time: 09/29/23  1:24 PM  Result Value Ref Range   Color, Urine YELLOW YELLOW   APPearance CLEAR CLEAR   Specific Gravity, Urine 1.018 1.005 - 1.030   pH 5.0 5.0 - 8.0   Glucose, UA >=500 (A) NEGATIVE mg/dL   Hgb urine dipstick NEGATIVE NEGATIVE   Bilirubin Urine NEGATIVE NEGATIVE   Ketones, ur NEGATIVE  NEGATIVE mg/dL   Protein, ur NEGATIVE NEGATIVE mg/dL   Nitrite NEGATIVE NEGATIVE   Leukocytes,Ua NEGATIVE NEGATIVE   RBC / HPF 0-5 0 - 5 RBC/hpf   WBC, UA 0-5 0 - 5 WBC/hpf   Bacteria, UA NONE SEEN NONE SEEN   Squamous Epithelial / HPF 0-5 0 - 5 /HPF    Comment: Performed at [facility], [address] [place], [place] [address]  Rapid urine drug screen (hospital performed)     Status: None   Collection Time: 09/29/23  1:24 PM  Result Value Ref Range   Opiates NONE DETECTED NONE DETECTED   Cocaine NONE DETECTED NONE DETECTED   Benzodiazepines NONE DETECTED NONE DETECTED   Amphetamines  NONE DETECTED NONE DETECTED   Tetrahydrocannabinol NONE DETECTED NONE DETECTED   Barbiturates NONE DETECTED NONE DETECTED    Comment: (NOTE) DRUG SCREEN FOR MEDICAL PURPOSES ONLY.  IF CONFIRMATION IS NEEDED FOR ANY PURPOSE, NOTIFY LAB WITHIN 5 DAYS.  LOWEST DETECTABLE LIMITS FOR URINE DRUG SCREEN Drug Class                     Cutoff (ng/mL) Amphetamine and metabolites    1000 Barbiturate and metabolites    200 Benzodiazepine                 200 Opiates and metabolites        300 Cocaine and metabolites        300 THC                            50 Performed at [facility], [address], [place], [place] [address]    ECHOCARDIOGRAM COMPLETE Result Date: 09/29/2023    ECHOCARDIOGRAM REPORT   Patient Name:   Don [name] Date of Exam: 09/29/2023 Medical Rec #:  [id number]       Height:       72.0 in Accession #:    [id number]      Weight:       179.9 lb Date of Birth:  03/05/1946       BSA:          2.037 m Patient Age:    77 years        BP:           117/86 mmHg Patient Gender: M               HR:           64 bpm. Exam Location:  Inpatient Procedure: 2D Echo, Cardiac Doppler and Color Doppler Indications:    Syncope R55  History:        Patient has prior history of Echocardiogram examinations, most                 recent 02/14/2021. Cardiomyopathy and CHF, CKD, stage 3,                  Signs/Symptoms:Chest Pain and Syncope; Risk                 Factors:Hypertension.  Sonographer:    Thea Norlander RCS Referring Phys: EMELINE HERO GARDNER IMPRESSIONS  1. Left ventricular ejection fraction, by estimation, is 55 to 60%. The left ventricle has normal function. The left ventricle has no regional wall motion abnormalities. There is mild left ventricular hypertrophy. Left ventricular diastolic parameters are consistent with Grade I diastolic dysfunction (impaired relaxation).  2. Right ventricular systolic function is normal. The right ventricular size is normal. There is normal pulmonary artery systolic pressure.  3. The mitral valve is normal in structure. No evidence of mitral valve regurgitation. No evidence of mitral stenosis.  4. The tricuspid valve is abnormal.  5. The aortic valve is tricuspid. There is mild calcification of the aortic valve. There is mild thickening of the aortic valve. Aortic valve regurgitation is mild. No aortic stenosis is present.  6. Aortic dilatation noted. There is mild dilatation of the ascending aorta, measuring 38 mm.  7. The inferior vena cava is normal in size with greater than 50% respiratory variability, suggesting right atrial pressure of 3 mmHg. FINDINGS  Left Ventricle: Left ventricular  ejection fraction, by estimation, is 55 to 60%. The left ventricle has normal function. The left ventricle has no regional wall motion abnormalities. The left ventricular internal cavity size was normal in size. There is  mild left ventricular hypertrophy. Left ventricular diastolic parameters are consistent with Grade I diastolic dysfunction (impaired relaxation). Right Ventricle: The right ventricular size is normal. Right vetricular wall thickness was not well visualized. Right ventricular systolic function is normal. There is normal pulmonary artery systolic pressure. The tricuspid regurgitant velocity is 1.93 m/s, and with an assumed right atrial pressure of 3 mmHg, the  estimated right ventricular systolic pressure is 17.9 mmHg. Left Atrium: Left atrial size was normal in size. Right Atrium: Right atrial size was normal in size. Pericardium: There is no evidence of pericardial effusion. Mitral Valve: The mitral valve is normal in structure. No evidence of mitral valve regurgitation. No evidence of mitral valve stenosis. Tricuspid Valve: The tricuspid valve is abnormal. Tricuspid valve regurgitation is mild . No evidence of tricuspid stenosis. Aortic Valve: The aortic valve is tricuspid. There is mild calcification of the aortic valve. There is mild thickening of the aortic valve. There is mild aortic valve annular calcification. Aortic valve regurgitation is mild. No aortic stenosis is present. Aortic valve mean gradient measures 2.7 mmHg. Aortic valve peak gradient measures 5.9 mmHg. Aortic valve area, by VTI measures 2.58 cm. Pulmonic Valve: The pulmonic valve was not well visualized. Pulmonic valve regurgitation is trivial. No evidence of pulmonic stenosis. Aorta: The aortic root is normal in size and structure and aortic dilatation noted. There is mild dilatation of the ascending aorta, measuring 38 mm. Venous: The inferior vena cava is normal in size with greater than 50% respiratory variability, suggesting right atrial pressure of 3 mmHg. IAS/Shunts: No atrial level shunt detected by color flow Doppler.  LEFT VENTRICLE PLAX 2D LVIDd:         4.50 cm   Diastology LVIDs:         3.30 cm   LV e' medial:    3.37 cm/s LV PW:         1.20 cm   LV E/e' medial:  10.7 LV IVS:        1.30 cm   LV e' lateral:   3.70 cm/s LVOT diam:     2.20 cm   LV E/e' lateral: 9.8 LV SV:         61 LV SV Index:   30 LVOT Area:     3.80 cm  RIGHT VENTRICLE             IVC RV S prime:     12.60 cm/s  IVC diam: 1.10 cm TAPSE (M-mode): 1.0 cm LEFT ATRIUM           Index        RIGHT ATRIUM           Index LA diam:      4.00 cm 1.96 cm/m   RA Area:     11.40 cm LA Vol (A2C): 49.5 ml 24.30 ml/m  RA  Volume:   21.60 ml  10.61 ml/m LA Vol (A4C): 47.1 ml 23.12 ml/m  AORTIC VALVE AV Area (Vmax):    2.31 cm AV Area (Vmean):   2.46 cm AV Area (VTI):     2.58 cm AV Vmax:           121.10 cm/s AV Vmean:          73.480 cm/s AV VTI:  0.238 m AV Peak Grad:      5.9 mmHg AV Mean Grad:      2.7 mmHg LVOT Vmax:         73.57 cm/s LVOT Vmean:        47.500 cm/s LVOT VTI:          0.162 m LVOT/AV VTI ratio: 0.68  AORTA Ao Root diam: 3.60 cm Ao Asc diam:  3.80 cm MITRAL VALVE               TRICUSPID VALVE MV Area (PHT): 2.50 cm    TR Peak grad:   14.9 mmHg MV Decel Time: 303 msec    TR Vmax:        193.00 cm/s MV E velocity: 36.20 cm/s MV A velocity: 69.20 cm/s  SHUNTS MV E/A ratio:  0.52        Systemic VTI:  0.16 m                            Systemic Diam: 2.20 cm Dorn Ross MD Electronically signed by Dorn Ross MD Signature Date/Time: 09/29/2023/5:20:35 PM    Final    EEG adult Result Date: 09/29/2023 Shelton Arlin KIDD, MD     09/29/2023  7:19 AM Patient Name: JAC ROMULUS MRN: [id number] Epilepsy Attending: Arlin KIDD Shelton Referring Physician/Provider: [name] Emeline HERO, DO Date: 09/29/2023 Duration: 23.04 mins Patient history: 78yo M with ams getting eeg to evaluate for seizure Level of alertness: Awake, asleep AEDs during EEG study: None Technical aspects: This EEG study was done with scalp electrodes positioned according to the 10-20 International system of electrode placement. Electrical activity was reviewed with band pass filter of 1-70Hz , sensitivity of 7 uV/mm, display speed of 67mm/sec with a 60Hz  notched filter applied as appropriate. EEG data were recorded continuously and digitally stored.  Video monitoring was available and reviewed as appropriate. Description: The posterior dominant rhythm consists of 8-9  Hz activity of moderate voltage (25-35 uV) seen predominantly in posterior head regions, symmetric and reactive to eye opening and eye closing. Sleep was characterized by vertex  waves, sleep spindles (12 to 14 Hz), maximal frontocentral region. Hyperventilation and photic stimulation were not performed.   IMPRESSION: This study is within normal limits. No seizures or epileptiform discharges were seen throughout the recording. A normal interictal EEG does not exclude the diagnosis of epilepsy. Arlin KIDD Shelton   DG Chest Port 1 View Result Date: 09/29/2023 CLINICAL DATA:  Altered mental status EXAM: PORTABLE CHEST 1 VIEW COMPARISON:  09/14/2023 FINDINGS: The heart size and mediastinal contours are within normal limits. Both lungs are clear. The visualized skeletal structures are unremarkable. IMPRESSION: No active disease. Electronically Signed   By: Oneil Devonshire M.D.   On: 09/29/2023 01:53   CT HEAD WO CONTRAST Result Date: 09/29/2023 CLINICAL DATA:  Delirium EXAM: CT HEAD WITHOUT CONTRAST TECHNIQUE: Contiguous axial images were obtained from the base of the skull through the vertex without intravenous contrast. RADIATION DOSE REDUCTION: This exam was performed according to the departmental dose-optimization program which includes automated exposure control, adjustment of the mA and/or kV according to patient size and/or use of iterative reconstruction technique. COMPARISON:  CT head 09/14/2023. FINDINGS: Brain: No evidence of acute infarction, hemorrhage, hydrocephalus, extra-axial collection or mass lesion/mass effect. Vascular: No hyperdense vessel identified. Skull: No acute fracture. Sinuses/Orbits: Remote left medial orbital wall fracture. No acute orbital findings. Clear sinuses. Other: No mastoid effusions. IMPRESSION: Stable  head CT.  No evidence of acute intracranial abnormality. Electronically Signed   By: Gilmore GORMAN Molt M.D.   On: 09/29/2023 00:50    Pending Labs Unresulted Labs (From admission, onward)     Start     Ordered   09/30/23 0500  Basic metabolic panel  Tomorrow morning,   R        09/29/23 1035            Vitals/Pain Today's Vitals   09/29/23  1216 09/29/23 1320 09/29/23 1359 09/29/23 1435  BP:  114/63  117/86  Pulse:  64  67  Resp:  15  17  Temp:    97.6 F (36.4 C)  TempSrc:    Oral  SpO2:  98%  99%  Weight:      Height:      PainSc: 0-No pain  0-No pain     Isolation Precautions No active isolations  Medications Medications  sodium chloride  flush (NS) 0.9 % injection 3 mL (3 mLs Intravenous Given 09/29/23 1038)  enoxaparin  (LOVENOX ) injection 40 mg (40 mg Subcutaneous Given 09/29/23 1037)  acetaminophen  (TYLENOL ) tablet 650 mg (has no administration in time range)    Or  acetaminophen  (TYLENOL ) suppository 650 mg (has no administration in time range)  ondansetron  (ZOFRAN ) tablet 4 mg (has no administration in time range)    Or  ondansetron  (ZOFRAN ) injection 4 mg (has no administration in time range)  0.9 %  sodium chloride  infusion ( Intravenous New Bag/Given 09/29/23 1241)  allopurinol  (ZYLOPRIM ) tablet 150 mg (150 mg Oral Given 09/29/23 1205)  pantoprazole  (PROTONIX ) EC tablet 40 mg (40 mg Oral Given 09/29/23 1207)  pravastatin  (PRAVACHOL ) tablet 40 mg (40 mg Oral Given 09/29/23 1207)  ondansetron  (ZOFRAN ) injection 4 mg (4 mg Intravenous Given 09/29/23 0033)    Mobility walks     Focused Assessments Cardiac Assessment Handoff:  Cardiac Rhythm: Normal sinus rhythm No results found for: CKTOTAL, CKMB, CKMBINDEX, TROPONINI No results found for: DDIMER Does the Patient currently have chest pain? No   , Neuro Assessment Handoff:  Swallow screen pass? Yes  Cardiac Rhythm: Normal sinus rhythm       Neuro Assessment: Exceptions to [facility] Neuro Checks:      Has TPA been given?  If patient is a Neuro Trauma and patient is going to OR before floor call report to 4N Charge nurse: [phone] or [phone]  , Renal Assessment Handoff:  Hemodialysis Schedule:  Last Hemodialysis date and time:    Restricted appendage:   , Pulmonary Assessment Handoff:  Lung sounds:   O2 Device: Room Air       R Recommendations: See Admitting Provider Note  Report given to:   Additional Notes:   Pt is very pleasant, but presents tired/weak. Previous notes report that orthos were performed, and Pt was ortho positive with difficulty standing without assistance. Pt is given fluids now and vitals are presently stable,

## 2023-09-29 NOTE — Evaluation (Signed)
 Physical Therapy Evaluation Patient Details Name: Don [name] MRN: [id number] DOB: 1945-10-08 Today's Date: 09/29/2023  History of Present Illness  Pt is a 78 y.o. male who presented 09/28/23 with AMS. Head CT showed no evidence of acute intracranial abnormality. PMH: aortic atherosclerosis, chronic HFrEF, CKD stage 3a, cirrhosis, cardiomyopathy, cerebrovascular disease, HTN, gout, hepatitis C, HLD   Clinical Impression  Pt presents with condition above and deficits mentioned below, see PT Problem List. Prior to pt's recent L ankle gout flare up (unclear when it began), he was independent without an AD. Since the gout flare up he has been getting wheeled around in a rolling chair and needing assistance for pivot transfers. Pt typically lives with his sister in [place] in a 2-level house with 6 STE. He is currently visiting family here in [place]. Currently, pt is disoriented and demonstrating deficits in attention, problem-solving, processing speed, sequencing, gross strength, L ankle dorsiflexion ROM, balance, power, and activity tolerance. He is at high risk for falls, needing minA for bed mobility and heavy modA to transfer to stand and maintain his standing balance with a RW. He was unable to take any steps due to L ankle pain this date, but did initiate pre-gait training through lateral weight shifting. He was also limited by symptomatic orthostatic hypotension, see below. Notified RN and MD. Pt has recently had a drastic functional decline and has been difficult for family to manage at home. He could greatly benefit from intensive inpatient rehab, > 3 hours/day, if he can qualify. Will continue to follow acutely.   Vitals -  110/69 (83) & 71 bpm supine 88/64 (69) & 87 bpm sitting (pt confused, less responsive, and reported lightheadedness) 101/76 (86) & 88 bpm sitting EOB several min while performing leg AROM (symptoms improving gradually) 150/113 (126) sitting after standing (BP unable  to get reading while standing despite attempts) 132/65 (86) & 73 bpm supine end of session         If plan is discharge home, recommend the following: Two people to help with walking and/or transfers;A lot of help with bathing/dressing/bathroom;Assistance with cooking/housework;Direct supervision/assist for medications management;Direct supervision/assist for financial management;Assist for transportation;Help with stairs or ramp for entrance   Can travel by private vehicle        Equipment Recommendations Wheelchair (measurements PT);Wheelchair cushion (measurements PT);Other (comment) (tub bench)  Recommendations for Other Services  Rehab consult;OT consult    Functional Status Assessment Patient has had a recent decline in their functional status and demonstrates the ability to make significant improvements in function in a reasonable and predictable amount of time.     Precautions / Restrictions Precautions Precautions: Fall Precaution Comments: watch BP Restrictions Weight Bearing Restrictions Per Provider Order: No      Mobility  Bed Mobility Overal bed mobility: Needs Assistance Bed Mobility: Supine to Sit, Sit to Supine     Supine to sit: Min assist, HOB elevated Sit to supine: Min assist, HOB elevated   General bed mobility comments: MinA needed to ascend trunk to sit R EOB, extra time needed to manage legs off EOB. MinA needed to lift legs back onto bed with return to supine    Transfers Overall transfer level: Needs assistance Equipment used: Rolling walker (2 wheels) Transfers: Sit to/from Stand Sit to Stand: Mod assist, From elevated surface           General transfer comment: Heavy modA needed to power pt up to stand, shift his weight anteriorly, and gain his balance when  standing from edge of stretcher. Pt cued to place L foot anterior to R to reduce L foot pain as he was unable to tolerate bringing his L foot posteriorly to match his R due to pain. Pt  pulled up on RW with his bil UEs.    Ambulation/Gait             Pre-gait activities: Verbal and tactile cues provided to shift weight bil, modA for balance. Cues also provided for pt to push through UEs on RW for improved upright posture and L ankle pain management, poor success noted. General Gait Details: unable due to L ankle pain  Stairs            Wheelchair Mobility     Tilt Bed    Modified Rankin (Stroke Patients Only) Modified Rankin (Stroke Patients Only) Pre-Morbid Rankin Score: Severe disability (as of recent gout flare up) Modified Rankin: Severe disability     Balance Overall balance assessment: Needs assistance Sitting-balance support: Bilateral upper extremity supported, Feet supported Sitting balance-Leahy Scale: Poor Sitting balance - Comments: UE support and CGA-minA for static sitting balance, as pt became lightheaded and confused (BP initially dropped) he began to slip his hands off the EOB often Postural control: Posterior lean Standing balance support: Bilateral upper extremity supported, During functional activity, Reliant on assistive device for balance Standing balance-Leahy Scale: Poor Standing balance comment: ModA and bil UE support on RW to stand with trunk flexed and a posterior lean                             Pertinent Vitals/Pain Pain Assessment Pain Assessment: Faces Faces Pain Scale: Hurts whole lot Pain Location: L ankle (reports recent gout flare up) Pain Descriptors / Indicators: Discomfort, Grimacing, Guarding, Moaning Pain Intervention(s): Limited activity within patient's tolerance, Monitored during session, Repositioned    Home Living Family/patient expects to be discharged to:: Private residence Living Arrangements: Other relatives (sister) Available Help at Discharge: Family;Available 24 hours/day Type of Home: House Home Access: Stairs to enter Entrance Stairs-Rails: Doctor, General Practice  of Steps: 6   Home Layout: Two level;Able to live on main level with bedroom/bathroom Home Equipment: BSC/3in1;Rolling Walker (2 wheels);Crutches;Cane - single point Additional Comments: currently visiting other family (niece/nephew/brother etc) otherwise lives in [place] with sister; info above is for home in [place]    Prior Function Prior Level of Function : Independent/Modified Independent             Mobility Comments: has been having difficulty standing since recently having gout in L foot, been getting pushed around in a rolling chair and needing assist for pivot transfers to/from the toilet; Independent, no AD before the gout (unclear if gout in L foot started a few weeks or months ago, family unable to provide that info) ADLs Comments: does not drive; has been needing assistance for ADLs since he got L foot gout, before that he was independent (unclear if gout in L foot started a few weeks or months ago, family unable to provide that info)     Extremity/Trunk Assessment   Upper Extremity Assessment Upper Extremity Assessment: Defer to OT evaluation    Lower Extremity Assessment Lower Extremity Assessment: Generalized weakness;LLE deficits/detail LLE Deficits / Details: pain at L ankle, reportedly due to recent gout flare up; limited ankle dorsiflexion PROM to approximately -10', limited by pain; gross weakness    Cervical / Trunk Assessment Cervical / Trunk Assessment: Normal  Communication   Communication Communication: No apparent difficulties  Cognition Arousal: Alert Behavior During Therapy: Flat affect Overall Cognitive Status: Impaired/Different from baseline Area of Impairment: Orientation, Attention, Memory, Following commands, Safety/judgement, Awareness, Problem solving                 Orientation Level: Disoriented to, Situation, Place (did not ask time; improved recall of place and situation towards end after PT notified pt of them at start of  session) Current Attention Level: Selective Memory: Decreased short-term memory Following Commands: Follows one step commands consistently, Follows one step commands with increased time, Follows multi-step commands inconsistently, Follows multi-step commands with increased time Safety/Judgement: Decreased awareness of safety, Decreased awareness of deficits Awareness: Emergent Problem Solving: Slow processing, Difficulty sequencing, Requires verbal cues General Comments: Pt with flat affect throughout. Slow to process cues, needing extra time and cues repeated. Pt initially unaware of situation but knew he was in a hospital, but did not know which one. Pt later able to recall this info after PT notified him of it at start of session.        General Comments General comments (skin integrity, edema, etc.): Vitals - 110/69 (83) & 71 bpm supine, 88/64 (69) & 87 bpm sitting (pt confused, less responsive, and reported lightheadedness), 101/76 (86) & 88 bpm sitting EOB several min while performing leg AROM (symptoms improving gradually), 150/113 (126) sitting after standing (BP unable to get reading while standing despite attempts), 132/65 (86) & 73 bpm supine end of session    Exercises General Exercises - Lower Extremity Ankle Circles/Pumps: AROM, Both, 5 reps, Seated Long Arc Quad: AROM, Both, 15 reps, Seated   Assessment/Plan    PT Assessment Patient needs continued PT services  PT Problem List Decreased strength;Decreased range of motion;Decreased activity tolerance;Decreased balance;Decreased mobility;Decreased coordination;Decreased cognition;Cardiopulmonary status limiting activity;Pain       PT Treatment Interventions DME instruction;Gait training;Stair training;Functional mobility training;Therapeutic activities;Therapeutic exercise;Balance training;Neuromuscular re-education;Cognitive remediation;Patient/family education;Wheelchair mobility training    PT Goals (Current goals can be  found in the Care Plan section)  Acute Rehab PT Goals Patient Stated Goal: to improve PT Goal Formulation: With patient/family Time For Goal Achievement: 10/13/23 Potential to Achieve Goals: Good    Frequency Min 1X/week     Co-evaluation               AM-PAC PT 6 Clicks Mobility  Outcome Measure Help needed turning from your back to your side while in a flat bed without using bedrails?: A Little Help needed moving from lying on your back to sitting on the side of a flat bed without using bedrails?: A Little Help needed moving to and from a bed to a chair (including a wheelchair)?: A Lot Help needed standing up from a chair using your arms (e.g., wheelchair or bedside chair)?: A Lot Help needed to walk in hospital room?: Total Help needed climbing 3-5 steps with a railing? : Total 6 Click Score: 12    End of Session Equipment Utilized During Treatment: Gait belt Activity Tolerance: Patient limited by pain;Other (comment) (limited by orthostatic hypotension) Patient left: in bed;with call bell/phone within reach;with family/visitor present Nurse Communication: Other (comment) (BP - notified MD) PT Visit Diagnosis: Unsteadiness on feet (R26.81);Other abnormalities of gait and mobility (R26.89);Muscle weakness (generalized) (M62.81);Difficulty in walking, not elsewhere classified (R26.2);Dizziness and giddiness (R42);Pain Pain - Right/Left: Left Pain - part of body: Ankle and joints of foot    Time: 0920-0957 PT Time Calculation (min) (ACUTE ONLY): 37 min  Charges:   PT Evaluation $PT Eval Moderate Complexity: 1 Mod PT Treatments $Therapeutic Activity: 8-22 mins PT General Charges $$ ACUTE PT VISIT: 1 Visit         Theo Ferretti, PT, DPT Acute Rehabilitation Services  Office: [phone]   Theo CHRISTELLA Ferretti 09/29/2023, 10:15 AM

## 2023-09-29 NOTE — Assessment & Plan Note (Addendum)
 Pt with h/o NICM, EF was 20-25% in 2020, but had recovered to 50% by 2022 (time of last echo). Cont coreg Cont Entresto Cont Lasix Getting 2d echo as above

## 2023-09-30 DIAGNOSIS — R55 Syncope and collapse: Secondary | ICD-10-CM

## 2023-09-30 DIAGNOSIS — I428 Other cardiomyopathies: Secondary | ICD-10-CM | POA: Diagnosis not present

## 2023-09-30 DIAGNOSIS — I10 Essential (primary) hypertension: Secondary | ICD-10-CM

## 2023-09-30 DIAGNOSIS — N183 Chronic kidney disease, stage 3 unspecified: Secondary | ICD-10-CM | POA: Diagnosis not present

## 2023-09-30 LAB — GLUCOSE, CAPILLARY
Glucose-Capillary: 93 mg/dL (ref 70–99)
Glucose-Capillary: 90 mg/dL (ref 70–99)

## 2023-09-30 LAB — BASIC METABOLIC PANEL
Anion gap: 7 (ref 5–15)
BUN: 15 mg/dL (ref 8–23)
CO2: 27 mmol/L (ref 22–32)
Calcium: 8.5 mg/dL — ABNORMAL LOW (ref 8.9–10.3)
Chloride: 102 mmol/L (ref 98–111)
Creatinine, Ser: 1.28 mg/dL — ABNORMAL HIGH (ref 0.61–1.24)
GFR, Estimated: 58 mL/min — ABNORMAL LOW (ref >60)
Glucose, Bld: 128 mg/dL — ABNORMAL HIGH (ref 70–99)
Potassium: 3.8 mmol/L (ref 3.5–5.1)
Sodium: 136 mmol/L (ref 135–145)

## 2023-09-30 LAB — HIV ANTIBODY (ROUTINE TESTING W REFLEX): HIV Screen 4th Generation wRfx: NONREACTIVE

## 2023-09-30 NOTE — Plan of Care (Signed)
  Problem: Education: Goal: Knowledge of General Education information will improve Description: Including pain rating scale, medication(s)/side effects and non-pharmacologic comfort measures Outcome: Progressing   Problem: Health Behavior/Discharge Planning: Goal: Ability to manage health-related needs will improve Outcome: Progressing   Problem: Clinical Measurements: Goal: Ability to maintain clinical measurements within normal limits will improve Outcome: Progressing   Problem: Activity: Goal: Risk for activity intolerance will decrease Outcome: Progressing   Problem: Safety: Goal: Ability to remain free from injury will improve Outcome: Progressing   Problem: Skin Integrity: Goal: Risk for impaired skin integrity will decrease Outcome: Progressing

## 2023-09-30 NOTE — Progress Notes (Signed)
 PROGRESS NOTE        PATIENT DETAILS Name: Don [name] Age: 78 y.o. Sex: male Date of Birth: 03/15/1946 Admit Date: 09/28/2023 Admitting Physician Emeline CHRISTELLA Idol, DO ERE:[place], [facility]  Brief Summary: Patient is a 78 y.o.  male with history of HFrEF with recovered EF, HTN, liver cirrhosis, CKD stage IIIb who presented with syncope-found to have orthostatic hypotension.  Significant events: 1/3>> admit to [facility]  Significant studies: 1/4>> CT head: No acute findings 1/4>> CXR: No PNA 1/4>> echo: EF 55-60%, grade 1 diastolic dysfunction 1/4>> EEG: No seizures 1/4>> TSH: 3.31 1/4>> NH4: 12 1/4>> UDS: Negative  Significant microbiology data: None  Procedures: None  Consults: None  Subjective: Poor historian-answers questions upon repeated attempts.  Denies any nausea/vomiting.  Felt dizzy when he stood up yesterday.  No dizziness when he sat up in bed.  Objective: Vitals: Blood pressure (!) 109/55, pulse 78, temperature 97.9 F (36.6 C), temperature source Oral, resp. rate 18, height 6' (1.829 m), weight 79.3 kg, SpO2 98%.   Exam: Gen Exam:Alert awake-not in any distress HEENT:atraumatic, normocephalic Chest: B/L clear to auscultation anteriorly CVS:S1S2 regular Abdomen:soft non tender, non distended Extremities:no edema Neurology: Non focal Skin: no rash  Pertinent Labs/Radiology:    Latest Ref Rng & Units 09/29/2023    4:19 AM 09/29/2023   12:50 AM 09/29/2023   12:39 AM  CBC  WBC 4.0 - 10.5 K/uL 8.2     Hemoglobin 13.0 - 17.0 g/dL 89.1  89.0  89.0   Hematocrit 39.0 - 52.0 % 32.7  32.0  32.0   Platelets 150 - 400 K/uL 391       Lab Results  Component Value Date   NA 136 09/30/2023   K 3.8 09/30/2023   CL 102 09/30/2023   CO2 27 09/30/2023     Assessment/Plan: Syncope Occurred when he stood up and went to the bathroom to urinate Suspect orthostatic mechanism (orthostatic vital signs +ve on 1/4-on  Lasix /Entresto /beta-blocker at home)-although vasovagal is on the differential as well. All antihypertensives/diuretics held-has been adequately hydrated overnight-stop IVF Place compression stockings Recheck orthostatic vital signs today Given overall decline in health/function-check cortisol with a.m. labs. Continue telemetry monitoring-no major arrhythmias seen overnight.  ?  Confusion Poor historian-no family at bedside Although initially appears confused-upon redirection/repeated questioning-he does answer appropriately. TSH/ammonia levels/neuroimaging/EEG all stable Check B12/RPR with a.m. labs Delirium precautions  History of HFrEF with recovered EF Euvolemic on exam Given declining health-orthostatic hypotension-syncope-stopping diuretics/beta-blocker/Entresto  for now  CKD stage IIIb At baseline  HTN BP stable-in fact soft this morning Diuretics/antihypertensives on hold  HLD Statin  ?  Liver cirrhosis He does not have any stigmata of liver disease clinically Check INR with a.m. labs-albumin  is on the lower side. Reported history of HCV in the past  Debility/deconditioning No family at bedside but apparently significant decline in function over the past several months-he is mostly wheelchair-bound.  Although discharge summary in 2020 does mention ambulatory dysfunction. B12/cortisol with a.m. labs PT/OT Suspect will require SNF on discharge.  BMI: Estimated body mass index is 23.71 kg/m as calculated from the following:   Height as of this encounter: 6' (1.829 m).   Weight as of this encounter: 79.3 kg.   Code status:   Code Status: Full Code   DVT Prophylaxis: enoxaparin  (LOVENOX ) injection 40 mg Start: 09/29/23 1000  Family Communication: None at bedside   Disposition Plan: Status is: Inpatient Remains inpatient appropriate because: Severity of illness   Planned Discharge Destination:Skilled nursing facility   Diet: Diet Order              Diet Heart Room service appropriate? Yes; Fluid consistency: Thin  Diet effective now                     Antimicrobial agents: Anti-infectives (From admission, onward)    None        MEDICATIONS: Scheduled Meds:  allopurinol   150 mg Oral Daily   enoxaparin  (LOVENOX ) injection  40 mg Subcutaneous Q24H   pantoprazole   40 mg Oral Daily   pravastatin   40 mg Oral Daily   sodium chloride  flush  3 mL Intravenous Q12H   Continuous Infusions:  sodium chloride  100 mL/hr at 09/30/23 0653   PRN Meds:.acetaminophen  **OR** acetaminophen , ondansetron  **OR** ondansetron  (ZOFRAN ) IV   I have personally reviewed following labs and imaging studies  LABORATORY DATA: CBC: Recent Labs  Lab 09/28/23 2356 09/29/23 0039 09/29/23 0050 09/29/23 0419  WBC 8.1  --   --  8.2  NEUTROABS 6.0  --   --   --   HGB 11.0* 10.9* 10.9* 10.8*  HCT 33.0* 32.0* 32.0* 32.7*  MCV 92.4  --   --  92.4  PLT 436*  --   --  391    Basic Metabolic Panel: Recent Labs  Lab 09/28/23 2356 09/29/23 0039 09/29/23 0050 09/29/23 0419 09/30/23 0528  NA 135 136 136 134* 136  K 3.7 3.7 3.8 3.8 3.8  CL 94* 97*  --  97* 102  CO2 27  --   --  28 27  GLUCOSE 177* 151*  --  143* 128*  BUN 17 18  --  19 15  CREATININE 1.65* 1.60*  --  1.56* 1.28*  CALCIUM  9.4  --   --  9.0 8.5*    GFR: Estimated Creatinine Clearance: 53 mL/min (A) (by C-G formula based on SCr of 1.28 mg/dL (H)).  Liver Function Tests: Recent Labs  Lab 09/28/23 2356  AST 39  ALT 43  ALKPHOS 132*  BILITOT 0.5  PROT 7.2  ALBUMIN  2.0*   No results for input(s): LIPASE, AMYLASE in the last 168 hours. Recent Labs  Lab 09/29/23 0032  AMMONIA 12    Coagulation Profile: No results for input(s): INR, PROTIME in the last 168 hours.  Cardiac Enzymes: No results for input(s): CKTOTAL, CKMB, CKMBINDEX, TROPONINI in the last 168 hours.  BNP (last 3 results) No results for input(s): PROBNP in the last 8760  hours.  Lipid Profile: No results for input(s): CHOL, HDL, LDLCALC, TRIG, CHOLHDL, LDLDIRECT in the last 72 hours.  Thyroid  Function Tests: Recent Labs    09/29/23 0419  TSH 3.331    Anemia Panel: No results for input(s): VITAMINB12, FOLATE, FERRITIN, TIBC, IRON, RETICCTPCT in the last 72 hours.  Urine analysis:    Component Value Date/Time   COLORURINE YELLOW 09/29/2023 1324   APPEARANCEUR CLEAR 09/29/2023 1324   LABSPEC 1.018 09/29/2023 1324   PHURINE 5.0 09/29/2023 1324   GLUCOSEU >=500 (A) 09/29/2023 1324   HGBUR NEGATIVE 09/29/2023 1324   BILIRUBINUR NEGATIVE 09/29/2023 1324   KETONESUR NEGATIVE 09/29/2023 1324   PROTEINUR NEGATIVE 09/29/2023 1324   UROBILINOGEN 1.0 02/01/2012 1300   NITRITE NEGATIVE 09/29/2023 1324   LEUKOCYTESUR NEGATIVE 09/29/2023 1324    Sepsis Labs: Lactic Acid, Venous No results found for:  LATICACIDVEN  MICROBIOLOGY: No results found for this or any previous visit (from the past 240 hours).  RADIOLOGY STUDIES/RESULTS: ECHOCARDIOGRAM COMPLETE Result Date: 09/29/2023    ECHOCARDIOGRAM REPORT   Patient Name:   MICO SPARK Date of Exam: 09/29/2023 Medical Rec #:  [id number]       Height:       72.0 in Accession #:    [id number]      Weight:       179.9 lb Date of Birth:  07/11/1946       BSA:          2.037 m Patient Age:    77 years        BP:           117/86 mmHg Patient Gender: M               HR:           64 bpm. Exam Location:  Inpatient Procedure: 2D Echo, Cardiac Doppler and Color Doppler Indications:    Syncope R55  History:        Patient has prior history of Echocardiogram examinations, most                 recent 02/14/2021. Cardiomyopathy and CHF, CKD, stage 3,                 Signs/Symptoms:Chest Pain and Syncope; Risk                 Factors:Hypertension.  Sonographer:    Thea Norlander RCS Referring Phys: EMELINE HERO GARDNER IMPRESSIONS  1. Left ventricular ejection fraction, by estimation, is 55 to 60%. The left  ventricle has normal function. The left ventricle has no regional wall motion abnormalities. There is mild left ventricular hypertrophy. Left ventricular diastolic parameters are consistent with Grade I diastolic dysfunction (impaired relaxation).  2. Right ventricular systolic function is normal. The right ventricular size is normal. There is normal pulmonary artery systolic pressure.  3. The mitral valve is normal in structure. No evidence of mitral valve regurgitation. No evidence of mitral stenosis.  4. The tricuspid valve is abnormal.  5. The aortic valve is tricuspid. There is mild calcification of the aortic valve. There is mild thickening of the aortic valve. Aortic valve regurgitation is mild. No aortic stenosis is present.  6. Aortic dilatation noted. There is mild dilatation of the ascending aorta, measuring 38 mm.  7. The inferior vena cava is normal in size with greater than 50% respiratory variability, suggesting right atrial pressure of 3 mmHg. FINDINGS  Left Ventricle: Left ventricular ejection fraction, by estimation, is 55 to 60%. The left ventricle has normal function. The left ventricle has no regional wall motion abnormalities. The left ventricular internal cavity size was normal in size. There is  mild left ventricular hypertrophy. Left ventricular diastolic parameters are consistent with Grade I diastolic dysfunction (impaired relaxation). Right Ventricle: The right ventricular size is normal. Right vetricular wall thickness was not well visualized. Right ventricular systolic function is normal. There is normal pulmonary artery systolic pressure. The tricuspid regurgitant velocity is 1.93 m/s, and with an assumed right atrial pressure of 3 mmHg, the estimated right ventricular systolic pressure is 17.9 mmHg. Left Atrium: Left atrial size was normal in size. Right Atrium: Right atrial size was normal in size. Pericardium: There is no evidence of pericardial effusion. Mitral Valve: The mitral  valve is normal in structure. No evidence of mitral valve regurgitation. No evidence of  mitral valve stenosis. Tricuspid Valve: The tricuspid valve is abnormal. Tricuspid valve regurgitation is mild . No evidence of tricuspid stenosis. Aortic Valve: The aortic valve is tricuspid. There is mild calcification of the aortic valve. There is mild thickening of the aortic valve. There is mild aortic valve annular calcification. Aortic valve regurgitation is mild. No aortic stenosis is present. Aortic valve mean gradient measures 2.7 mmHg. Aortic valve peak gradient measures 5.9 mmHg. Aortic valve area, by VTI measures 2.58 cm. Pulmonic Valve: The pulmonic valve was not well visualized. Pulmonic valve regurgitation is trivial. No evidence of pulmonic stenosis. Aorta: The aortic root is normal in size and structure and aortic dilatation noted. There is mild dilatation of the ascending aorta, measuring 38 mm. Venous: The inferior vena cava is normal in size with greater than 50% respiratory variability, suggesting right atrial pressure of 3 mmHg. IAS/Shunts: No atrial level shunt detected by color flow Doppler.  LEFT VENTRICLE PLAX 2D LVIDd:         4.50 cm   Diastology LVIDs:         3.30 cm   LV e' medial:    3.37 cm/s LV PW:         1.20 cm   LV E/e' medial:  10.7 LV IVS:        1.30 cm   LV e' lateral:   3.70 cm/s LVOT diam:     2.20 cm   LV E/e' lateral: 9.8 LV SV:         61 LV SV Index:   30 LVOT Area:     3.80 cm  RIGHT VENTRICLE             IVC RV S prime:     12.60 cm/s  IVC diam: 1.10 cm TAPSE (M-mode): 1.0 cm LEFT ATRIUM           Index        RIGHT ATRIUM           Index LA diam:      4.00 cm 1.96 cm/m   RA Area:     11.40 cm LA Vol (A2C): 49.5 ml 24.30 ml/m  RA Volume:   21.60 ml  10.61 ml/m LA Vol (A4C): 47.1 ml 23.12 ml/m  AORTIC VALVE AV Area (Vmax):    2.31 cm AV Area (Vmean):   2.46 cm AV Area (VTI):     2.58 cm AV Vmax:           121.10 cm/s AV Vmean:          73.480 cm/s AV VTI:             0.238 m AV Peak Grad:      5.9 mmHg AV Mean Grad:      2.7 mmHg LVOT Vmax:         73.57 cm/s LVOT Vmean:        47.500 cm/s LVOT VTI:          0.162 m LVOT/AV VTI ratio: 0.68  AORTA Ao Root diam: 3.60 cm Ao Asc diam:  3.80 cm MITRAL VALVE               TRICUSPID VALVE MV Area (PHT): 2.50 cm    TR Peak grad:   14.9 mmHg MV Decel Time: 303 msec    TR Vmax:        193.00 cm/s MV E velocity: 36.20 cm/s MV A velocity: 69.20 cm/s  SHUNTS MV E/A ratio:  0.52        Systemic VTI:  0.16 m                            Systemic Diam: 2.20 cm Dorn Ross MD Electronically signed by Dorn Ross MD Signature Date/Time: 09/29/2023/5:20:35 PM    Final    EEG adult Result Date: 09/29/2023 Shelton Arlin KIDD, MD     09/29/2023  7:19 AM Patient Name: MILAN PERKINS MRN: [id number] Epilepsy Attending: Arlin KIDD Shelton Referring Physician/Provider: [name] Emeline HERO, DO Date: 09/29/2023 Duration: 23.04 mins Patient history: 78yo M with ams getting eeg to evaluate for seizure Level of alertness: Awake, asleep AEDs during EEG study: None Technical aspects: This EEG study was done with scalp electrodes positioned according to the 10-20 International system of electrode placement. Electrical activity was reviewed with band pass filter of 1-70Hz , sensitivity of 7 uV/mm, display speed of 67mm/sec with a 60Hz  notched filter applied as appropriate. EEG data were recorded continuously and digitally stored.  Video monitoring was available and reviewed as appropriate. Description: The posterior dominant rhythm consists of 8-9  Hz activity of moderate voltage (25-35 uV) seen predominantly in posterior head regions, symmetric and reactive to eye opening and eye closing. Sleep was characterized by vertex waves, sleep spindles (12 to 14 Hz), maximal frontocentral region. Hyperventilation and photic stimulation were not performed.   IMPRESSION: This study is within normal limits. No seizures or epileptiform discharges were seen throughout the  recording. A normal interictal EEG does not exclude the diagnosis of epilepsy. Arlin KIDD Shelton   DG Chest Port 1 View Result Date: 09/29/2023 CLINICAL DATA:  Altered mental status EXAM: PORTABLE CHEST 1 VIEW COMPARISON:  09/14/2023 FINDINGS: The heart size and mediastinal contours are within normal limits. Both lungs are clear. The visualized skeletal structures are unremarkable. IMPRESSION: No active disease. Electronically Signed   By: Oneil Devonshire M.D.   On: 09/29/2023 01:53   CT HEAD WO CONTRAST Result Date: 09/29/2023 CLINICAL DATA:  Delirium EXAM: CT HEAD WITHOUT CONTRAST TECHNIQUE: Contiguous axial images were obtained from the base of the skull through the vertex without intravenous contrast. RADIATION DOSE REDUCTION: This exam was performed according to the departmental dose-optimization program which includes automated exposure control, adjustment of the mA and/or kV according to patient size and/or use of iterative reconstruction technique. COMPARISON:  CT head 09/14/2023. FINDINGS: Brain: No evidence of acute infarction, hemorrhage, hydrocephalus, extra-axial collection or mass lesion/mass effect. Vascular: No hyperdense vessel identified. Skull: No acute fracture. Sinuses/Orbits: Remote left medial orbital wall fracture. No acute orbital findings. Clear sinuses. Other: No mastoid effusions. IMPRESSION: Stable head CT.  No evidence of acute intracranial abnormality. Electronically Signed   By: Gilmore GORMAN Molt M.D.   On: 09/29/2023 00:50     LOS: 1 day   Donalda Applebaum, MD  [facility]    To contact the attending provider between 7A-7P or the covering provider during after hours 7P-7A, please log into the web site [email or web address] and access using universal [place] password for that web site. If you do not have the password, please call the hospital operator.  09/30/2023, 10:03 AM

## 2023-09-30 NOTE — Evaluation (Signed)
 Occupational Therapy Evaluation Patient Details Name: Don [name] MRN: [id number] DOB: 03-15-1946 Today's Date: 09/30/2023   History of Present Illness Pt is a 78 y.o. male who presented 09/28/23 with AMS. Head CT showed no evidence of acute intracranial abnormality. PMH: aortic atherosclerosis, chronic HFrEF, CKD stage 3a, cirrhosis, cardiomyopathy, cerebrovascular disease, HTN, gout, hepatitis C, HLD   Clinical Impression   Don [name] was evaluated s/p the above admission list. He is indep at baseline, however has been limited due to L foot gout flare and has been performing transfer level mobility only. Upon evaluation the pt was limited by L foot pain, generalized weakness, poor activity tolerance and self-limiting. Overall he needed min A for bed mobility and once sitting reported significant increase in L foot pain. BP was stable from supine to sitting. Pt declined standing attempt despite encouragement due to exacerbation of L foot pain. Due to the deficits listed below the pt also needs up to max A for LB ADLs and set up A for UB ADLs in sitting. Pt will benefit from continued acute OT services and intensive inpatient follow up therapy, >3 hours/day after discharge to progress to indep baseline.        If plan is discharge home, recommend the following: A lot of help with walking and/or transfers;A lot of help with bathing/dressing/bathroom;Assistance with cooking/housework;Assistance with feeding;Direct supervision/assist for medications management;Direct supervision/assist for financial management;Assist for transportation;Help with stairs or ramp for entrance    Functional Status Assessment  Patient has had a recent decline in their functional status and demonstrates the ability to make significant improvements in function in a reasonable and predictable amount of time.  Equipment Recommendations  None recommended by OT (defer)    Recommendations for Other Services Rehab consult      Precautions / Restrictions Precautions Precautions: Fall Precaution Comments: watch BP Restrictions Weight Bearing Restrictions Per Provider Order: No      Mobility Bed Mobility Overal bed mobility: Needs Assistance Bed Mobility: Supine to Sit, Sit to Supine     Supine to sit: Contact guard Sit to supine: Min assist        Transfers Overall transfer level: Needs assistance                 General transfer comment: pt declined any attempt to stand even with encouagement, elevated bed and NWB LLE      Balance Overall balance assessment: Needs assistance Sitting-balance support: Feet supported, Single extremity supported Sitting balance-Leahy Scale: Fair                                     ADL either performed or assessed with clinical judgement   ADL Overall ADL's : Needs assistance/impaired Eating/Feeding: Independent   Grooming: Set up;Sitting   Upper Body Bathing: Set up;Sitting   Lower Body Bathing: Maximal assistance;Sitting/lateral leans   Upper Body Dressing : Set up;Sitting   Lower Body Dressing: Maximal assistance;Sitting/lateral leans   Toilet Transfer: Maximal assistance;Stand-pivot Toilet Transfer Details (indicate cue type and reason): declined OOB on evaluation Toileting- Clothing Manipulation and Hygiene: Maximal assistance;Sitting/lateral lean       Functional mobility during ADLs: Maximal assistance;Rolling walker (2 wheels) General ADL Comments: pt declined any attempt to stand, even with encouragement to stay NWB on painful L foot.     Vision Baseline Vision/History: 0 No visual deficits Vision Assessment?: No apparent visual deficits     Perception Perception:  Not tested       Praxis Praxis: Not tested       Pertinent Vitals/Pain Pain Assessment Pain Assessment: Faces Faces Pain Scale: Hurts whole lot Pain Location: L ankle/top of foot (reports recent gout flare up) Pain Intervention(s): Monitored  during session, Limited activity within patient's tolerance     Extremity/Trunk Assessment Upper Extremity Assessment Upper Extremity Assessment: Generalized weakness   Lower Extremity Assessment Lower Extremity Assessment: Defer to PT evaluation   Cervical / Trunk Assessment Cervical / Trunk Assessment: Normal   Communication Communication Communication: No apparent difficulties   Cognition Arousal: Alert Behavior During Therapy: Flat affect Overall Cognitive Status: Impaired/Different from baseline Area of Impairment: Orientation, Attention, Memory, Following commands, Safety/judgement, Awareness, Problem solving                 Orientation Level: Disoriented to, Situation Current Attention Level: Selective Memory: Decreased short-term memory Following Commands: Follows one step commands consistently, Follows one step commands with increased time, Follows multi-step commands inconsistently, Follows multi-step commands with increased time Safety/Judgement: Decreased awareness of safety, Decreased awareness of deficits Awareness: Emergent Problem Solving: Slow processing, Difficulty sequencing, Requires verbal cues General Comments: falt affect throughout, limited insight. self-limiting behaviors.     General Comments  BP stable from supine to sitting    Exercises     Shoulder Instructions      Home Living Family/patient expects to be discharged to:: Private residence Living Arrangements: Non-relatives/Friends Available Help at Discharge: Family;Available 24 hours/day Type of Home: House Home Access: Stairs to enter [employer] of Steps: 6 Entrance Stairs-Rails: Right;Left Home Layout: Two level;Able to live on main level with bedroom/bathroom Alternate Level Stairs-Number of Steps: 10 (10) Alternate Level Stairs-Rails: Left Bathroom Shower/Tub: Tub/shower unit   Bathroom Toilet: Standard Bathroom Accessibility: Yes   Home Equipment:  BSC/3in1;Rolling Walker (2 wheels);Crutches;Cane - single point   Additional Comments: currently visiting other family (niece/nephew/brother etc) otherwise lives in [place] with sister; info above is for home in [place]      Prior Functioning/Environment Prior Level of Function : Independent/Modified Independent             Mobility Comments: has been having difficulty standing since recently having gout in L foot, been getting pushed around in a rolling chair and needing assist for pivot transfers to/from the toilet; Independent, no AD before the gout ADLs Comments: does not drive; has been needing assistance for ADLs since he got L foot gout, before that he was independent        OT Problem List: Decreased strength;Decreased range of motion;Decreased activity tolerance;Impaired balance (sitting and/or standing);Decreased safety awareness;Decreased knowledge of use of DME or AE;Decreased knowledge of precautions;Decreased cognition;Pain      OT Treatment/Interventions: Therapeutic exercise;DME and/or AE instruction;Therapeutic activities;Balance training;Patient/family education    OT Goals(Current goals can be found in the care plan section) Acute Rehab OT Goals Patient Stated Goal: less pain OT Goal Formulation: With patient Time For Goal Achievement: 10/14/23 Potential to Achieve Goals: Good ADL Goals Pt Will Perform Lower Body Dressing: with contact guard assist;sit to/from stand Pt Will Transfer to Toilet: with contact guard assist;stand pivot transfer;bedside commode Pt Will Perform Toileting - Clothing Manipulation and hygiene: with modified independence;sitting/lateral leans Additional ADL Goal #1: Pt will tolerate 5 minute OOB activity as a precursor to ADLs  OT Frequency: Min 1X/week    Co-evaluation              AM-PAC OT 6 Clicks Daily Activity  Outcome Measure Help from another person eating meals?: None Help from another person taking care of  personal grooming?: A Little Help from another person toileting, which includes using toliet, bedpan, or urinal?: A Lot Help from another person bathing (including washing, rinsing, drying)?: A Lot Help from another person to put on and taking off regular upper body clothing?: A Little Help from another person to put on and taking off regular lower body clothing?: A Lot 6 Click Score: 16   End of Session Nurse Communication: Mobility status  Activity Tolerance: Patient limited by pain Patient left: in bed;with bed alarm set;with call bell/phone within reach  OT Visit Diagnosis: Unsteadiness on feet (R26.81);Other abnormalities of gait and mobility (R26.89);Muscle weakness (generalized) (M62.81);Pain                Time: 1032-1050 OT Time Calculation (min): 18 min Charges:  OT General Charges $OT Visit: 1 Visit OT Evaluation $OT Eval Moderate Complexity: 1 Mod  Lucie Kendall, OTR/L Acute Rehabilitation Services Office [phone] Secure Chat Communication Preferred   Lucie JONETTA Kendall 09/30/2023, 11:12 AM

## 2023-10-01 ENCOUNTER — Inpatient Hospital Stay (HOSPITAL_COMMUNITY): Payer: 59

## 2023-10-01 DIAGNOSIS — I10 Essential (primary) hypertension: Secondary | ICD-10-CM | POA: Diagnosis not present

## 2023-10-01 DIAGNOSIS — R55 Syncope and collapse: Secondary | ICD-10-CM | POA: Diagnosis not present

## 2023-10-01 DIAGNOSIS — N183 Chronic kidney disease, stage 3 unspecified: Secondary | ICD-10-CM | POA: Diagnosis not present

## 2023-10-01 DIAGNOSIS — I428 Other cardiomyopathies: Secondary | ICD-10-CM | POA: Diagnosis not present

## 2023-10-01 LAB — GLUCOSE, CAPILLARY
Glucose-Capillary: 94 mg/dL (ref 70–99)
Glucose-Capillary: 96 mg/dL (ref 70–99)
Glucose-Capillary: 111 mg/dL — ABNORMAL HIGH (ref 70–99)

## 2023-10-01 LAB — BASIC METABOLIC PANEL
Anion gap: 9 (ref 5–15)
BUN: 10 mg/dL (ref 8–23)
CO2: 24 mmol/L (ref 22–32)
Calcium: 8.5 mg/dL — ABNORMAL LOW (ref 8.9–10.3)
Chloride: 101 mmol/L (ref 98–111)
Creatinine, Ser: 1.21 mg/dL (ref 0.61–1.24)
GFR, Estimated: >60 mL/min (ref >60)
Glucose, Bld: 108 mg/dL — ABNORMAL HIGH (ref 70–99)
Potassium: 3.7 mmol/L (ref 3.5–5.1)
Sodium: 134 mmol/L — ABNORMAL LOW (ref 135–145)

## 2023-10-01 LAB — PROTIME-INR
INR: 1.2 (ref 0.8–1.2)
Prothrombin Time: 15.6 s — ABNORMAL HIGH (ref 11.4–15.2)

## 2023-10-01 LAB — CORTISOL-AM, BLOOD: Cortisol - AM: 8.4 ug/dL (ref 6.7–22.6)

## 2023-10-01 LAB — URIC ACID: Uric Acid, Serum: 4.8 mg/dL (ref 3.7–8.6)

## 2023-10-01 LAB — RPR: RPR Ser Ql: NONREACTIVE

## 2023-10-01 LAB — VITAMIN B12: Vitamin B-12: 155 pg/mL — ABNORMAL LOW (ref 180–914)

## 2023-10-01 MED ORDER — CYANOCOBALAMIN 1000 MCG/ML IJ SOLN
1000.0000 ug | Freq: Every day | INTRAMUSCULAR | Status: DC
  Administered 2023-10-01 – 2023-10-04 (×4): 1000 ug via SUBCUTANEOUS
  Filled 2023-10-01 (×4): qty 1

## 2023-10-01 MED ORDER — COSYNTROPIN 0.25 MG IJ SOLR
0.2500 mg | Freq: Once | INTRAMUSCULAR | Status: DC
  Filled 2023-10-01: qty 0.25

## 2023-10-01 NOTE — Plan of Care (Signed)
  Problem: Education: Goal: Knowledge of General Education information will improve Description: Including pain rating scale, medication(s)/side effects and non-pharmacologic comfort measures Outcome: Progressing   Problem: Clinical Measurements: Goal: Ability to maintain clinical measurements within normal limits will improve Outcome: Progressing   Problem: Activity: Goal: Risk for activity intolerance will decrease Outcome: Progressing   Problem: Safety: Goal: Ability to remain free from injury will improve Outcome: Progressing   Problem: Skin Integrity: Goal: Risk for impaired skin integrity will decrease Outcome: Progressing   

## 2023-10-01 NOTE — Progress Notes (Signed)
 Mobility Specialist Progress Note;   10/01/23 1545  Mobility  Activity Transferred from chair to bed  Level of Assistance +2 (takes two people)  Assistive Device Other (Comment) (HHA)  Distance Ambulated (ft) 3 ft  Activity Response Tolerated well  Mobility Referral No  Mobility visit 1 Mobility  Mobility Specialist Start Time (ACUTE ONLY) 1545  Mobility Specialist Stop Time (ACUTE ONLY) 1555  Mobility Specialist Time Calculation (min) (ACUTE ONLY) 10 min   Answered pts call bell requesting to get back into bed. Required ModA +2 via HHA to transfer from chair to bed. VSS throughout. C/o L foot pain during transfer. Pt left comfortably in bed with all needs met, alarm on.   Lauraine Erm Mobility Specialist Please contact via SecureChat or [employer] [phone]

## 2023-10-01 NOTE — NC FL2 (Signed)
 [place]  MEDICAID FL2 LEVEL OF CARE FORM     IDENTIFICATION  Patient Name: Don [name] Birthdate: [birth year] Sex: male Admission Date (Current Location): 09/28/2023  [facility] and [place] Number:  Producer, Television/film/video and Address:  [facility], [address], [place], [place] [address]      Provider Number: [id number]  Attending Physician Name and Address:  Raenelle Donalda HERO, MD  Relative Name and Phone Number:       Current Level of Care: Hospital Recommended Level of Care: Skilled Nursing Facility Prior Approval Number:    Date Approved/Denied:   PASRR Number:  [id number]  Discharge Plan: SNF    Current Diagnoses: Patient Active Problem List   Diagnosis Date Noted   Syncope 09/29/2023   NICM (nonischemic cardiomyopathy) (HCC) 09/29/2023   CKD (chronic kidney disease) stage 3, GFR 30-59 ml/min (HCC) 09/29/2023   Right hand pain 08/04/2023   Atypical chest pain 08/04/2023   AKI (acute kidney injury) (HCC) 08/03/2023   Leukocytosis 08/03/2023   Falls 08/03/2023   Hypertension    CHF (congestive heart failure) (HCC) 04/19/2019   Pleural effusion    High cholesterol 03/19/2015   GERD (gastroesophageal reflux disease) 03/19/2015   Gout 03/19/2015   Hepatitis C 03/19/2015    Orientation RESPIRATION BLADDER Height & Weight     Time, Self, Place  Normal Incontinent, External catheter Weight: 148 lb 9.4 oz (67.4 kg) Height:  6' (182.9 cm)  BEHAVIORAL SYMPTOMS/MOOD NEUROLOGICAL BOWEL NUTRITION STATUS      Continent Diet (See dc summary)  AMBULATORY STATUS COMMUNICATION OF NEEDS Skin   Limited Assist Verbally Normal                       Personal Care Assistance Level of Assistance  Bathing, Feeding, Dressing Bathing Assistance: Limited assistance Feeding assistance: Limited assistance Dressing Assistance: Limited assistance     Functional Limitations Info             SPECIAL CARE FACTORS FREQUENCY  PT (By licensed  PT), OT (By licensed OT)     PT Frequency: 5x/week OT Frequency: 5x/week            Contractures Contractures Info: Not present    Additional Factors Info  Code Status, Allergies Code Status Info: Full Allergies Info: Penicillins           Current Medications (10/01/2023):  This is the current hospital active medication list Current Facility-Administered Medications  Medication Dose Route Frequency Provider Last Rate Last Admin   acetaminophen  (TYLENOL ) tablet 650 mg  650 mg Oral Q6H PRN [name] Emeline HERO, DO       Or   acetaminophen  (TYLENOL ) suppository 650 mg  650 mg Rectal Q6H PRN [name] Emeline HERO, DO       allopurinol  (ZYLOPRIM ) tablet 150 mg  150 mg Oral Daily Adhikari, Ivonne, MD   150 mg at 10/01/23 0904   [START ON 10/02/2023] cosyntropin  (CORTROSYN ) injection 0.25 mg  0.25 mg Intravenous Once Ghimire, Shanker M, MD       cyanocobalamin  (VITAMIN B12) injection 1,000 mcg  1,000 mcg Subcutaneous Daily Raenelle Donalda HERO, MD   1,000 mcg at 10/01/23 9095   enoxaparin  (LOVENOX ) injection 40 mg  40 mg Subcutaneous Q24H [name] Emeline M, DO   40 mg at 10/01/23 9096   ondansetron  (ZOFRAN ) tablet 4 mg  4 mg Oral Q6H PRN [name] Emeline HERO, DO       Or  ondansetron  (ZOFRAN ) injection 4 mg  4 mg Intravenous Q6H PRN [name] Emeline HERO, DO       pantoprazole  (PROTONIX ) EC tablet 40 mg  40 mg Oral Daily Jillian Buttery, MD   40 mg at 10/01/23 0903   pravastatin  (PRAVACHOL ) tablet 40 mg  40 mg Oral Daily Jillian Buttery, MD   40 mg at 10/01/23 9095   sodium chloride  flush (NS) 0.9 % injection 3 mL  3 mL Intravenous Q12H [name] Emeline HERO, DO   3 mL at 10/01/23 9095     Discharge Medications: Please see discharge summary for a list of discharge medications.  Relevant Imaging Results:  Relevant Lab Results:   Additional Information SSN: [id number] [place], [place]

## 2023-10-01 NOTE — Progress Notes (Signed)
 Inpatient Rehab Admissions Coordinator:   Consult received, Patient does not appear to meet medical necessity for inpatient rehab level of care. Signing off at this time. Other discharge dispositions will need to be explored.   Rehab Admissons Coordinator Jaylia Pettus, [place], [place] [phone]

## 2023-10-01 NOTE — Progress Notes (Signed)
 PROGRESS NOTE        PATIENT DETAILS Name: Don [name] Age: 78 y.o. Sex: male Date of Birth: 03/06/1946 Admit Date: 09/28/2023 Admitting Physician Emeline CHRISTELLA Idol, DO ERE:[place], [facility]  Brief Summary: Patient is a 78 y.o.  male with history of HFrEF with recovered EF, HTN, liver cirrhosis, CKD stage IIIb who presented with syncope-found to have orthostatic hypotension.  Significant events: 1/3>> admit to [facility]  Significant studies: 1/4>> CT head: No acute findings 1/4>> CXR: No PNA 1/4>> echo: EF 55-60%, grade 1 diastolic dysfunction 1/4>> EEG: No seizures 1/4>> TSH: 3.31 1/4>> NH4: 12 1/4>> UDS: Negative 1/6>> RPR: Non reactive 1/6>> a.m. cortisol: 8.4 1/6>> vitamin B12: 155 (low)  Significant microbiology data: None  Procedures: None  Consults: None  Subjective: Complains of left ankle pain.  Still feels poorly-poor historian.  Objective: Vitals: Blood pressure 128/63, pulse 72, temperature 98.5 F (36.9 C), temperature source Oral, resp. rate 20, height 6' (1.829 m), weight 67.4 kg, SpO2 100%.   Exam: Gen Exam:Alert awake-not in any distress HEENT:atraumatic, normocephalic Chest: B/L clear to auscultation anteriorly CVS:S1S2 regular Abdomen:soft non tender, non distended Extremities:no edema-mild tenderness in the left ankle when palpated. Neurology: Non focal Skin: no rash  Pertinent Labs/Radiology:    Latest Ref Rng & Units 09/29/2023    4:19 AM 09/29/2023   12:50 AM 09/29/2023   12:39 AM  CBC  WBC 4.0 - 10.5 K/uL 8.2     Hemoglobin 13.0 - 17.0 g/dL 89.1  89.0  89.0   Hematocrit 39.0 - 52.0 % 32.7  32.0  32.0   Platelets 150 - 400 K/uL 391       Lab Results  Component Value Date   NA 134 (L) 10/01/2023   K 3.7 10/01/2023   CL 101 10/01/2023   CO2 24 10/01/2023     Assessment/Plan: Syncope Occurred when he stood up and went to the bathroom to urinate Suspect orthostatic mechanism (orthostatic vital signs  +ve on 1/4-on Lasix /Entresto /beta-blocker at home)-although vasovagal is on the differential as well. Volume status stable after IVF hydration Place compression stockings Continue to hold all antihypertensives Repeat orthostatic vital signs today-mobilize with PT/OT. ACTH  stimulation test on 1/7  ?  Confusion Suspect he is mostly a poor historian-seems a bit better today  Workup as above  Supportive care-outpatient follow-up with neurology  Vitamin B12 deficiency SQ supplementation started 1/6   History of HFrEF with recovered EF Euvolemic on exam Given declining health-orthostatic hypotension-syncope-stopping diuretics/beta-blocker/Entresto  for now  CKD stage IIIb At baseline  HTN BP stable-in fact soft this morning Diuretics/antihypertensives on hold  HLD Statin  ?  Liver cirrhosis He does not have any stigmata of liver disease clinically Check INR with a.m. labs-albumin  is on the lower side. Reported history of HCV in the past  Left ankle pain No history of trauma-has a history of gout Check x-ray left ankle-if stable-trial of steroids.  Debility/deconditioning Apparently has had a significant decline in function over the past several months-he is mostly wheelchair-bound.  Although discharge summary in 2020 does mention ambulatory dysfunction. PT/OT Suspect will require SNF on discharge.  BMI: Estimated body mass index is 20.15 kg/m as calculated from the following:   Height as of this encounter: 6' (1.829 m).   Weight as of this encounter: 67.4 kg.   Code status:   Code Status:  Full Code   DVT Prophylaxis: Place TED hose Start: 09/30/23 1015 enoxaparin  (LOVENOX ) injection 40 mg Start: 09/29/23 1000    Family Communication: Sister-Dianne Medoza-[phone]-updated 1/6 over the phone.   Disposition Plan: Status is: Inpatient Remains inpatient appropriate because: Severity of illness   Planned Discharge Destination:Skilled nursing  facility   Diet: Diet Order             Diet Heart Room service appropriate? Yes; Fluid consistency: Thin  Diet effective now                     Antimicrobial agents: Anti-infectives (From admission, onward)    None        MEDICATIONS: Scheduled Meds:  allopurinol   150 mg Oral Daily   cyanocobalamin   1,000 mcg Subcutaneous Daily   enoxaparin  (LOVENOX ) injection  40 mg Subcutaneous Q24H   pantoprazole   40 mg Oral Daily   pravastatin   40 mg Oral Daily   sodium chloride  flush  3 mL Intravenous Q12H   Continuous Infusions:   PRN Meds:.acetaminophen  **OR** acetaminophen , ondansetron  **OR** ondansetron  (ZOFRAN ) IV   I have personally reviewed following labs and imaging studies  LABORATORY DATA: CBC: Recent Labs  Lab 09/28/23 2356 09/29/23 0039 09/29/23 0050 09/29/23 0419  WBC 8.1  --   --  8.2  NEUTROABS 6.0  --   --   --   HGB 11.0* 10.9* 10.9* 10.8*  HCT 33.0* 32.0* 32.0* 32.7*  MCV 92.4  --   --  92.4  PLT 436*  --   --  391    Basic Metabolic Panel: Recent Labs  Lab 09/28/23 2356 09/29/23 0039 09/29/23 0050 09/29/23 0419 09/30/23 0528 10/01/23 0457  NA 135 136 136 134* 136 134*  K 3.7 3.7 3.8 3.8 3.8 3.7  CL 94* 97*  --  97* 102 101  CO2 27  --   --  28 27 24   GLUCOSE 177* 151*  --  143* 128* 108*  BUN 17 18  --  19 15 10   CREATININE 1.65* 1.60*  --  1.56* 1.28* 1.21  CALCIUM  9.4  --   --  9.0 8.5* 8.5*    GFR: Estimated Creatinine Clearance: 48.7 mL/min (by C-G formula based on SCr of 1.21 mg/dL).  Liver Function Tests: Recent Labs  Lab 09/28/23 2356  AST 39  ALT 43  ALKPHOS 132*  BILITOT 0.5  PROT 7.2  ALBUMIN  2.0*   No results for input(s): LIPASE, AMYLASE in the last 168 hours. Recent Labs  Lab 09/29/23 0032  AMMONIA 12    Coagulation Profile: Recent Labs  Lab 10/01/23 0457  INR 1.2    Cardiac Enzymes: No results for input(s): CKTOTAL, CKMB, CKMBINDEX, TROPONINI in the last 168 hours.  BNP  (last 3 results) No results for input(s): PROBNP in the last 8760 hours.  Lipid Profile: No results for input(s): CHOL, HDL, LDLCALC, TRIG, CHOLHDL, LDLDIRECT in the last 72 hours.  Thyroid  Function Tests: Recent Labs    09/29/23 0419  TSH 3.331    Anemia Panel: Recent Labs    10/01/23 0457  VITAMINB12 155*    Urine analysis:    Component Value Date/Time   COLORURINE YELLOW 09/29/2023 1324   APPEARANCEUR CLEAR 09/29/2023 1324   LABSPEC 1.018 09/29/2023 1324   PHURINE 5.0 09/29/2023 1324   GLUCOSEU >=500 (A) 09/29/2023 1324   HGBUR NEGATIVE 09/29/2023 1324   BILIRUBINUR NEGATIVE 09/29/2023 1324   KETONESUR NEGATIVE 09/29/2023 1324   PROTEINUR NEGATIVE 09/29/2023  1324   UROBILINOGEN 1.0 02/01/2012 1300   NITRITE NEGATIVE 09/29/2023 1324   LEUKOCYTESUR NEGATIVE 09/29/2023 1324    Sepsis Labs: Lactic Acid, Venous No results found for: LATICACIDVEN  MICROBIOLOGY: No results found for this or any previous visit (from the past 240 hours).  RADIOLOGY STUDIES/RESULTS: ECHOCARDIOGRAM COMPLETE Result Date: 09/29/2023    ECHOCARDIOGRAM REPORT   Patient Name:   Don [name] Date of Exam: 09/29/2023 Medical Rec #:  [id number]       Height:       72.0 in Accession #:    [id number]      Weight:       179.9 lb Date of Birth:  04/09/1946       BSA:          2.037 m Patient Age:    77 years        BP:           117/86 mmHg Patient Gender: M               HR:           64 bpm. Exam Location:  Inpatient Procedure: 2D Echo, Cardiac Doppler and Color Doppler Indications:    Syncope R55  History:        Patient has prior history of Echocardiogram examinations, most                 recent 02/14/2021. Cardiomyopathy and CHF, CKD, stage 3,                 Signs/Symptoms:Chest Pain and Syncope; Risk                 Factors:Hypertension.  Sonographer:    Thea Norlander RCS Referring Phys: EMELINE HERO GARDNER IMPRESSIONS  1. Left ventricular ejection fraction, by estimation, is 55 to 60%.  The left ventricle has normal function. The left ventricle has no regional wall motion abnormalities. There is mild left ventricular hypertrophy. Left ventricular diastolic parameters are consistent with Grade I diastolic dysfunction (impaired relaxation).  2. Right ventricular systolic function is normal. The right ventricular size is normal. There is normal pulmonary artery systolic pressure.  3. The mitral valve is normal in structure. No evidence of mitral valve regurgitation. No evidence of mitral stenosis.  4. The tricuspid valve is abnormal.  5. The aortic valve is tricuspid. There is mild calcification of the aortic valve. There is mild thickening of the aortic valve. Aortic valve regurgitation is mild. No aortic stenosis is present.  6. Aortic dilatation noted. There is mild dilatation of the ascending aorta, measuring 38 mm.  7. The inferior vena cava is normal in size with greater than 50% respiratory variability, suggesting right atrial pressure of 3 mmHg. FINDINGS  Left Ventricle: Left ventricular ejection fraction, by estimation, is 55 to 60%. The left ventricle has normal function. The left ventricle has no regional wall motion abnormalities. The left ventricular internal cavity size was normal in size. There is  mild left ventricular hypertrophy. Left ventricular diastolic parameters are consistent with Grade I diastolic dysfunction (impaired relaxation). Right Ventricle: The right ventricular size is normal. Right vetricular wall thickness was not well visualized. Right ventricular systolic function is normal. There is normal pulmonary artery systolic pressure. The tricuspid regurgitant velocity is 1.93 m/s, and with an assumed right atrial pressure of 3 mmHg, the estimated right ventricular systolic pressure is 17.9 mmHg. Left Atrium: Left atrial size was normal in size. Right Atrium: Right atrial size  was normal in size. Pericardium: There is no evidence of pericardial effusion. Mitral Valve: The  mitral valve is normal in structure. No evidence of mitral valve regurgitation. No evidence of mitral valve stenosis. Tricuspid Valve: The tricuspid valve is abnormal. Tricuspid valve regurgitation is mild . No evidence of tricuspid stenosis. Aortic Valve: The aortic valve is tricuspid. There is mild calcification of the aortic valve. There is mild thickening of the aortic valve. There is mild aortic valve annular calcification. Aortic valve regurgitation is mild. No aortic stenosis is present. Aortic valve mean gradient measures 2.7 mmHg. Aortic valve peak gradient measures 5.9 mmHg. Aortic valve area, by VTI measures 2.58 cm. Pulmonic Valve: The pulmonic valve was not well visualized. Pulmonic valve regurgitation is trivial. No evidence of pulmonic stenosis. Aorta: The aortic root is normal in size and structure and aortic dilatation noted. There is mild dilatation of the ascending aorta, measuring 38 mm. Venous: The inferior vena cava is normal in size with greater than 50% respiratory variability, suggesting right atrial pressure of 3 mmHg. IAS/Shunts: No atrial level shunt detected by color flow Doppler.  LEFT VENTRICLE PLAX 2D LVIDd:         4.50 cm   Diastology LVIDs:         3.30 cm   LV e' medial:    3.37 cm/s LV PW:         1.20 cm   LV E/e' medial:  10.7 LV IVS:        1.30 cm   LV e' lateral:   3.70 cm/s LVOT diam:     2.20 cm   LV E/e' lateral: 9.8 LV SV:         61 LV SV Index:   30 LVOT Area:     3.80 cm  RIGHT VENTRICLE             IVC RV S prime:     12.60 cm/s  IVC diam: 1.10 cm TAPSE (M-mode): 1.0 cm LEFT ATRIUM           Index        RIGHT ATRIUM           Index LA diam:      4.00 cm 1.96 cm/m   RA Area:     11.40 cm LA Vol (A2C): 49.5 ml 24.30 ml/m  RA Volume:   21.60 ml  10.61 ml/m LA Vol (A4C): 47.1 ml 23.12 ml/m  AORTIC VALVE AV Area (Vmax):    2.31 cm AV Area (Vmean):   2.46 cm AV Area (VTI):     2.58 cm AV Vmax:           121.10 cm/s AV Vmean:          73.480 cm/s AV VTI:             0.238 m AV Peak Grad:      5.9 mmHg AV Mean Grad:      2.7 mmHg LVOT Vmax:         73.57 cm/s LVOT Vmean:        47.500 cm/s LVOT VTI:          0.162 m LVOT/AV VTI ratio: 0.68  AORTA Ao Root diam: 3.60 cm Ao Asc diam:  3.80 cm MITRAL VALVE               TRICUSPID VALVE MV Area (PHT): 2.50 cm    TR Peak grad:   14.9 mmHg MV Decel Time: 303  msec    TR Vmax:        193.00 cm/s MV E velocity: 36.20 cm/s MV A velocity: 69.20 cm/s  SHUNTS MV E/A ratio:  0.52        Systemic VTI:  0.16 m                            Systemic Diam: 2.20 cm Dorn Ross MD Electronically signed by Dorn Ross MD Signature Date/Time: 09/29/2023/5:20:35 PM    Final      LOS: 2 days   Donalda Applebaum, MD  [facility]    To contact the attending provider between 7A-7P or the covering provider during after hours 7P-7A, please log into the web site [email or web address] and access using universal [place] password for that web site. If you do not have the password, please call the hospital operator.  10/01/2023, 10:56 AM

## 2023-10-01 NOTE — TOC Initial Note (Signed)
 Transition of Care [facility]) - Initial/Assessment Note    Patient Details  Name: Don [name] MRN: [id number] Date of Birth: [birth year]  [facility] CM/SW Contact:    Inocente GORMAN Kindle, LCSW Phone Number: 10/01/2023, 4:29 PM  Clinical Narrative:                 CSW left voicemail for patient's sister.    Expected Discharge Plan: Skilled Nursing Facility Barriers to Discharge: Continued Medical Work up, English As A Second Language Teacher, SNF Pending bed offer   Patient Goals and CMS Choice Patient states their goals for this hospitalization and ongoing recovery are:: Rehab CMS Medicare.gov Compare Post Acute Care list provided to:: Patient Represenative (must comment) Choice offered to / list presented to : Sibling [place] ownership interest in [facility].provided to:: Sibling    Expected Discharge Plan and Services In-house Referral: Clinical Social Work   Post Acute Care Choice: Skilled Nursing Facility Living arrangements for the past 2 months: Single Family Home                                      Prior Living Arrangements/Services Living arrangements for the past 2 months: Single Family Home Lives with:: Siblings Patient language and need for interpreter reviewed:: Yes Do you feel safe going back to the place where you live?: Yes      Need for Family Participation in Patient Care: Yes (Comment) Care giver support system in place?: Yes (comment)   Criminal Activity/Legal Involvement Pertinent to Current Situation/Hospitalization: No - Comment as needed  Activities of Daily Living   ADL Screening (condition at time of admission) Independently performs ADLs?: Yes (appropriate for developmental age) Is the patient deaf or have difficulty hearing?: No Does the patient have difficulty seeing, even when wearing glasses/contacts?: No Does the patient have difficulty concentrating, remembering, or making decisions?: Yes  Permission  Sought/Granted Permission sought to share information with : Facility Medical Sales Representative, Family Supports Permission granted to share information with : Yes, Verbal Permission Granted     Permission granted to share info w AGENCY: SNFs  Permission granted to share info w Relationship: Sister     Emotional Assessment Appearance:: Appears stated age Attitude/Demeanor/Rapport: Engaged Affect (typically observed): Accepting Orientation: : Oriented to Self, Oriented to Place, Oriented to  Time Alcohol / Substance Use: Not Applicable Psych Involvement: No (comment)  Admission diagnosis:  Transient alteration of awareness [R40.4] Syncope and collapse [R55] Syncope [R55] Patient Active Problem List   Diagnosis Date Noted   Syncope 09/29/2023   NICM (nonischemic cardiomyopathy) (HCC) 09/29/2023   CKD (chronic kidney disease) stage 3, GFR 30-59 ml/min (HCC) 09/29/2023   Right hand pain 08/04/2023   Atypical chest pain 08/04/2023   AKI (acute kidney injury) (HCC) 08/03/2023   Leukocytosis 08/03/2023   Falls 08/03/2023   Hypertension    CHF (congestive heart failure) (HCC) 04/19/2019   Pleural effusion    High cholesterol 03/19/2015   GERD (gastroesophageal reflux disease) 03/19/2015   Gout 03/19/2015   Hepatitis C 03/19/2015   PCP:  [facility]:   [facility] - [place], [place] - [address] [place] [address] Phone: [phone] Fax: [phone]     Social Drivers of Health (SDOH) Social History: SDOH Screenings   Food Insecurity: No Food Insecurity (09/29/2023)  Housing: Low Risk  (09/29/2023)  Transportation Needs: No Transportation  Needs (09/29/2023)  Utilities: Not At Risk (09/29/2023)  Social Connections: Unknown (09/29/2023)  Tobacco Use: Medium Risk (09/28/2023)   SDOH Interventions:     Readmission Risk Interventions     No data to display

## 2023-10-02 DIAGNOSIS — N183 Chronic kidney disease, stage 3 unspecified: Secondary | ICD-10-CM | POA: Diagnosis not present

## 2023-10-02 DIAGNOSIS — R55 Syncope and collapse: Secondary | ICD-10-CM | POA: Diagnosis not present

## 2023-10-02 DIAGNOSIS — I10 Essential (primary) hypertension: Secondary | ICD-10-CM | POA: Diagnosis not present

## 2023-10-02 DIAGNOSIS — I428 Other cardiomyopathies: Secondary | ICD-10-CM | POA: Diagnosis not present

## 2023-10-02 LAB — ACTH STIMULATION, 3 TIME POINTS
Cortisol, 30 Min: 14.2 ug/dL
Cortisol, 60 Min: 15.2 ug/dL
Cortisol, Base: 15 ug/dL

## 2023-10-02 LAB — GLUCOSE, CAPILLARY
Glucose-Capillary: 113 mg/dL — ABNORMAL HIGH (ref 70–99)
Glucose-Capillary: 110 mg/dL — ABNORMAL HIGH (ref 70–99)
Glucose-Capillary: 123 mg/dL — ABNORMAL HIGH (ref 70–99)
Glucose-Capillary: 159 mg/dL — ABNORMAL HIGH (ref 70–99)

## 2023-10-02 MED ORDER — PREDNISONE 20 MG PO TABS
40.0000 mg | ORAL_TABLET | Freq: Every day | ORAL | Status: DC
  Administered 2023-10-02 – 2023-10-04 (×3): 40 mg via ORAL
  Filled 2023-10-02 (×3): qty 2

## 2023-10-02 NOTE — Plan of Care (Signed)
  Problem: Health Behavior/Discharge Planning: Goal: Ability to manage health-related needs will improve Outcome: Progressing   Problem: Clinical Measurements: Goal: Ability to maintain clinical measurements within normal limits will improve Outcome: Progressing   Problem: Activity: Goal: Risk for activity intolerance will decrease Outcome: Progressing   Problem: Skin Integrity: Goal: Risk for impaired skin integrity will decrease Outcome: Progressing

## 2023-10-02 NOTE — Plan of Care (Signed)

## 2023-10-02 NOTE — TOC Progression Note (Signed)
 Transition of Care [facility]) - Progression Note    Patient Details  Name: Don [name] MRN: [id number] Date of Birth: 1946/08/27  Transition of Care [facility]) CM/SW Contact  Inocente GORMAN Kindle, LCSW Phone Number: 10/02/2023, 9:38 AM  Clinical Narrative:    9am-CSW spoke with patient's sister (and grandson, Tre). Patient's sister reported that she is unable to care for patient at their home given patient's current physical needs and fall risk. She expressed understanding of PT recommendation and is agreeable to SNF placement at time of discharge. CSW discussed insurance authorization process and will provide Medicare SNF ratings list. CSW emailed SNF bed offer and ratings list to her grandson at [email or web address] and requested they select their first choice.     Skilled Nursing Rehab Facilities-   [email or web address] Ratings out of 5 stars (the highest)   Name Address  Phone # Quality Care Staffing Health Inspection Overall  [facility] [address] [phone] 2 2 5 5   [facility] [address] [place] [phone] 4 2 4 4   [facility] [address], [place] [phone] 2 1 2 1   [facility] [address] [place] [phone] 3 1 4 3   [facility]  [address], [place] [phone] 4 4 5 5   [facility] [address], [facility] [phone] 3 2 2 2   [facility] [address], [place] [phone] 5 1 2 2   [facility] [phone] [address] [place] [phone] 1 1 3 1   [address] ([facility]) [address], [place] [phone] 2 2 2 2   [facility] [address] [place], [place] [phone] 2 2 1 1   [facility] [address] [place], [place] [phone] 3 1 1 1   Clotilda Pereyra [address] [place] [phone] 3 3 4 4   [facility] [address], [place] [phone] 3 4 3 3           [facility] [address] [place] [phone] 4 2 1 1   [facility] [address] [place] [phone] 2 2 2 2   [facility] [address], [place] [phone] [address], [place] [address], [place] [phone] 1 1 2 1   [facility] [address], [employer] [phone] 2 1 4 3           [address] (no [facility]) [address], [place] [phone] 4 4 5 5   [facility] (No [employer]) [address], [place] [phone] 1 2 4 3   [facility] (No [employer]) [address], [place] [phone] 4 1 5 4   [facility] [address] [employer] [phone] 3 4 2 2   [facility] [address], [place] [phone] 2 1 2 1   [facility] [address] [place] [phone] 2 1 1 1   [facility] [address] [place] [phone] 4 2 5 5   [facility]  [address], [place] [phone] 2 2 3 3   [facility] [address], [place] [phone] 4 1 1 1   [facility] [address] [place], [place] [phone] 2 2 2 2           [facility] [address], [place] [phone] 2 1 1 1   [facility] [address], [place]  [phone] 3 3 3 3   [facility] [address], [place] [phone] 4 3 5 5   [facility] [place] [address] [place] [phone] 2  1 1 1   [facility] (No Humana) [address], [place] [phone] 2 2 4 4   [facility] [address] [place] [phone] 2 1 2 1           [facility] [address] [place], [place] [phone] 5 4 5 5   [facility])  [address] [place] [phone] 1 1 2 1   [facility] [address] [place] [phone]  2 4 4   [facility] [address] [place] [address] [place] [phone] 3 5 5 5   [address] [place], [place] [phone] 4 2 2 2   [facility]) [address] [place]  [phone] 2 1 3 2       Expected Discharge Plan: Skilled Nursing Facility Barriers to Discharge: Continued Medical Work up, English As A Second Language Teacher, SNF Pending bed offer  Expected Discharge Plan and Services In-house Referral: Clinical Social Work   Post Acute Care Choice: Skilled Nursing Facility Living arrangements for the past 2 months: Single Family Home                                       Social Determinants of Health (SDOH) Interventions SDOH Screenings   Food Insecurity: No Food Insecurity (09/29/2023)  Housing: Low Risk  (09/29/2023)  Transportation Needs: No Transportation Needs (09/29/2023)  Utilities: Not At Risk (09/29/2023)  Social Connections: Unknown (09/29/2023)  Tobacco Use: Medium Risk (09/28/2023)    Readmission Risk Interventions     No data to display

## 2023-10-02 NOTE — Progress Notes (Signed)
 Notified by night shift RN that lab would come draw this AM.  MD asked had it been drawn at 5 am.  Notified of above and stated he would cancel the order.  Medications returned to the pysix patient bin.

## 2023-10-02 NOTE — Progress Notes (Signed)
 Physical Therapy Treatment Patient Details Name: Don [name] MRN: [id number] DOB: [birth year] Today's Date: 10/02/2023   History of Present Illness Pt is a 78 y.o. male who presented 09/28/23 with AMS. Head CT showed no evidence of acute intracranial abnormality. PMH: aortic atherosclerosis, chronic HFrEF, CKD stage 3a, cirrhosis, cardiomyopathy, cerebrovascular disease, HTN, gout, hepatitis C, HLD    PT Comments  Pt received in supine and agreeable to session. Pt able to demonstrate bed mobility without physical assist this session. Pt able to tolerate 1 standing trial with up to mod A due to B feet pain and limited WB tolerance. Pt able to WB some through BLE to scoot towards [facility], however declines further standing trials. Pt continues to benefit from PT services to progress toward functional mobility goals.    If plan is discharge home, recommend the following: Two people to help with walking and/or transfers;A lot of help with bathing/dressing/bathroom;Assistance with cooking/housework;Direct supervision/assist for medications management;Direct supervision/assist for financial management;Assist for transportation;Help with stairs or ramp for entrance   Can travel by private vehicle        Equipment Recommendations  Wheelchair (measurements PT);Wheelchair cushion (measurements PT);Other (comment)    Recommendations for Other Services       Precautions / Restrictions Precautions Precautions: Fall Precaution Comments: watch BP Restrictions Weight Bearing Restrictions Per Provider Order: No     Mobility  Bed Mobility Overal bed mobility: Needs Assistance Bed Mobility: Supine to Sit, Sit to Supine     Supine to sit: Contact guard, HOB elevated, Used rails Sit to supine: Contact guard assist   General bed mobility comments: increased time/effort    Transfers Overall transfer level: Needs assistance Equipment used: Rolling walker (2 wheels) Transfers: Sit to/from Stand Sit to  Stand: Mod assist, From elevated surface           General transfer comment: mod A from elevated EOB after unsuccessful attempt from lower surface. Cues for B feet and hand placement. Pt able to perform multiple squats to scoot towards [facility] without physical assist    Ambulation/Gait               General Gait Details: unable      Balance Overall balance assessment: Needs assistance Sitting-balance support: Feet supported, Single extremity supported, No upper extremity supported Sitting balance-Leahy Scale: Fair Sitting balance - Comments: sitting EOB   Standing balance support: Bilateral upper extremity supported, During functional activity, Reliant on assistive device for balance Standing balance-Leahy Scale: Poor Standing balance comment: Limited WB tolerance on BLE, however briefly able to stand with CGA                            Cognition Arousal: Alert Behavior During Therapy: Flat affect Overall Cognitive Status: Impaired/Different from baseline                                          Exercises      General Comments        Pertinent Vitals/Pain Pain Assessment Pain Assessment: Faces Faces Pain Scale: Hurts whole lot Pain Location: B feet with WB Pain Descriptors / Indicators: Discomfort, Grimacing, Guarding, Moaning Pain Intervention(s): Monitored during session, Limited activity within patient's tolerance     PT Goals (current goals can now be found in the care plan section) Acute Rehab PT Goals Patient Stated Goal:  to improve PT Goal Formulation: With patient/family Time For Goal Achievement: 10/13/23 Progress towards PT goals: Progressing toward goals    Frequency    Min 1X/week       AM-PAC PT 6 Clicks Mobility   Outcome Measure  Help needed turning from your back to your side while in a flat bed without using bedrails?: None Help needed moving from lying on your back to sitting on the side of a flat  bed without using bedrails?: A Little Help needed moving to and from a bed to a chair (including a wheelchair)?: A Lot Help needed standing up from a chair using your arms (e.g., wheelchair or bedside chair)?: A Lot Help needed to walk in hospital room?: Total Help needed climbing 3-5 steps with a railing? : Total 6 Click Score: 13    End of Session Equipment Utilized During Treatment: Gait belt Activity Tolerance: Patient limited by pain Patient left: in bed;with call bell/phone within reach;with bed alarm set Nurse Communication: Mobility status PT Visit Diagnosis: Unsteadiness on feet (R26.81);Other abnormalities of gait and mobility (R26.89);Muscle weakness (generalized) (M62.81);Difficulty in walking, not elsewhere classified (R26.2);Dizziness and giddiness (R42);Pain     Time: 1445-1505 PT Time Calculation (min) (ACUTE ONLY): 20 min  Charges:    $Therapeutic Activity: 8-22 mins PT General Charges $$ ACUTE PT VISIT: 1 Visit                     Darryle George, PTA Acute Rehabilitation Services Secure Chat Preferred  [phone]    Darryle George 10/02/2023, 3:57 PM

## 2023-10-02 NOTE — Care Management Important Message (Signed)
 Important Message  Patient Details  Name: Don [name] MRN: [id number] Date of Birth: 1946-02-12   Important Message Given:  Yes - Medicare IM     [name] 10/02/2023, 2:25 PM

## 2023-10-02 NOTE — Progress Notes (Signed)
 PROGRESS NOTE        PATIENT DETAILS Name: Don [name] Age: 78 y.o. Sex: male Date of Birth: 1946-07-30 Admit Date: 09/28/2023 Admitting Physician Emeline CHRISTELLA Idol, DO ERE:[place]  Brief Summary: Patient is a 78 y.o.  male with history of HFrEF with recovered EF, HTN, liver cirrhosis, CKD stage IIIb who presented with syncope-found to have orthostatic hypotension.  Significant events: 1/3>> admit to [facility]  Significant studies: 1/4>> CT head: No acute findings 1/4>> CXR: No PNA 1/4>> echo: EF 55-60%, grade 1 diastolic dysfunction 1/4>> EEG: No seizures 1/4>> TSH: 3.31 1/4>> NH4: 12 1/4>> UDS: Negative 1/6>> RPR: Non reactive 1/6>> a.m. cortisol: 8.4 1/6>> vitamin B12: 155 (low)  Significant microbiology data: None  Procedures: None  Consults: None  Subjective: No complaints-continues to have pain in the left ankle area.  Objective: Vitals: Blood pressure 124/68, pulse 77, temperature 98.1 F (36.7 C), temperature source Oral, resp. rate 15, height 6' (1.829 m), weight 72.7 kg, SpO2 100%.   Exam: Gen Exam:Alert awake-not in any distress HEENT:atraumatic, normocephalic Chest: B/L clear to auscultation anteriorly CVS:S1S2 regular Abdomen:soft non tender, non distended Extremities:no edema-mild tenderness in left ankle-no overlying erythema/swelling. Neurology: Non focal Skin: no rash  Pertinent Labs/Radiology:    Latest Ref Rng & Units 09/29/2023    4:19 AM 09/29/2023   12:50 AM 09/29/2023   12:39 AM  CBC  WBC 4.0 - 10.5 K/uL 8.2     Hemoglobin 13.0 - 17.0 g/dL 89.1  89.0  89.0   Hematocrit 39.0 - 52.0 % 32.7  32.0  32.0   Platelets 150 - 400 K/uL 391       Lab Results  Component Value Date   NA 134 (L) 10/01/2023   K 3.7 10/01/2023   CL 101 10/01/2023   CO2 24 10/01/2023     Assessment/Plan: Syncope Occurred when he stood up and went to the bathroom to urinate Suspect orthostatic mechanism (orthostatic  vital signs +ve on 1/4-on Lasix /Entresto /beta-blocker at home)-although vasovagal is on the differential as well. Volume status stable after IVF hydration Please place compression stockings Orthostatic vital signs much better today Continue to hold all antihypertensives Repeat orthostatic vital signs today-mobilize with PT/OT.  ?  Confusion Suspect he is mostly a poor historian-seems a bit better today  Workup as above  Supportive care-outpatient follow-up with neurology  Vitamin B12 deficiency SQ supplementation started 1/6   History of HFrEF with recovered EF Euvolemic on exam Given declining health-orthostatic hypotension-syncope-stopping diuretics/beta-blocker/Entresto  for now  CKD stage IIIb At baseline  HTN BP stable-in fact soft this morning Diuretics/antihypertensives on hold  HLD Statin  ?  Liver cirrhosis He does not have any stigmata of liver disease clinically Check INR with a.m. labs-albumin  is on the lower side. Reported history of HCV in the past  Left ankle pain No history of trauma-has a history of gout Left ankle x-ray-no fracture-?  Gout-trial of steroids x 5 days.  Debility/deconditioning Apparently has had a significant decline in function over the past several months-he is mostly wheelchair-bound.  Although discharge summary in 2020 does mention ambulatory dysfunction. PT/OT Suspect will require SNF on discharge.  BMI: Estimated body mass index is 21.74 kg/m as calculated from the following:   Height as of this encounter: 6' (1.829 m).   Weight as of this encounter: 72.7 kg.   Code  status:   Code Status: Full Code   DVT Prophylaxis: Place TED hose Start: 09/30/23 1015 enoxaparin  (LOVENOX ) injection 40 mg Start: 09/29/23 1000    Family Communication: Sister-Dianne [phone]-updated 1/6 over the phone.   Disposition Plan: Status is: Inpatient Remains inpatient appropriate because: Severity of illness   Planned Discharge  Destination:Skilled nursing facility   Diet: Diet Order             Diet Heart Room service appropriate? Yes; Fluid consistency: Thin  Diet effective now                     Antimicrobial agents: Anti-infectives (From admission, onward)    None        MEDICATIONS: Scheduled Meds:  allopurinol   150 mg Oral Daily   cyanocobalamin   1,000 mcg Subcutaneous Daily   enoxaparin  (LOVENOX ) injection  40 mg Subcutaneous Q24H   pantoprazole   40 mg Oral Daily   pravastatin   40 mg Oral Daily   predniSONE   40 mg Oral Q breakfast   sodium chloride  flush  3 mL Intravenous Q12H   Continuous Infusions:   PRN Meds:.acetaminophen  **OR** acetaminophen , ondansetron  **OR** ondansetron  (ZOFRAN ) IV   I have personally reviewed following labs and imaging studies  LABORATORY DATA: CBC: Recent Labs  Lab 09/28/23 2356 09/29/23 0039 09/29/23 0050 09/29/23 0419  WBC 8.1  --   --  8.2  NEUTROABS 6.0  --   --   --   HGB 11.0* 10.9* 10.9* 10.8*  HCT 33.0* 32.0* 32.0* 32.7*  MCV 92.4  --   --  92.4  PLT 436*  --   --  391    Basic Metabolic Panel: Recent Labs  Lab 09/28/23 2356 09/29/23 0039 09/29/23 0050 09/29/23 0419 09/30/23 0528 10/01/23 0457  NA 135 136 136 134* 136 134*  K 3.7 3.7 3.8 3.8 3.8 3.7  CL 94* 97*  --  97* 102 101  CO2 27  --   --  28 27 24   GLUCOSE 177* 151*  --  143* 128* 108*  BUN 17 18  --  19 15 10   CREATININE 1.65* 1.60*  --  1.56* 1.28* 1.21  CALCIUM  9.4  --   --  9.0 8.5* 8.5*    GFR: Estimated Creatinine Clearance: 52.6 mL/min (by C-G formula based on SCr of 1.21 mg/dL).  Liver Function Tests: Recent Labs  Lab 09/28/23 2356  AST 39  ALT 43  ALKPHOS 132*  BILITOT 0.5  PROT 7.2  ALBUMIN  2.0*   No results for input(s): LIPASE, AMYLASE in the last 168 hours. Recent Labs  Lab 09/29/23 0032  AMMONIA 12    Coagulation Profile: Recent Labs  Lab 10/01/23 0457  INR 1.2    Cardiac Enzymes: No results for input(s): CKTOTAL,  CKMB, CKMBINDEX, TROPONINI in the last 168 hours.  BNP (last 3 results) No results for input(s): PROBNP in the last 8760 hours.  Lipid Profile: No results for input(s): CHOL, HDL, LDLCALC, TRIG, CHOLHDL, LDLDIRECT in the last 72 hours.  Thyroid  Function Tests: No results for input(s): TSH, T4TOTAL, FREET4, T3FREE, THYROIDAB in the last 72 hours.   Anemia Panel: Recent Labs    10/01/23 0457  VITAMINB12 155*    Urine analysis:    Component Value Date/Time   COLORURINE YELLOW 09/29/2023 1324   APPEARANCEUR CLEAR 09/29/2023 1324   LABSPEC 1.018 09/29/2023 1324   PHURINE 5.0 09/29/2023 1324   GLUCOSEU >=500 (A) 09/29/2023 1324   HGBUR NEGATIVE 09/29/2023 1324  BILIRUBINUR NEGATIVE 09/29/2023 1324   KETONESUR NEGATIVE 09/29/2023 1324   PROTEINUR NEGATIVE 09/29/2023 1324   UROBILINOGEN 1.0 02/01/2012 1300   NITRITE NEGATIVE 09/29/2023 1324   LEUKOCYTESUR NEGATIVE 09/29/2023 1324    Sepsis Labs: Lactic Acid, Venous No results found for: LATICACIDVEN  MICROBIOLOGY: No results found for this or any previous visit (from the past 240 hours).  RADIOLOGY STUDIES/RESULTS: DG Ankle 2 Views Left Result Date: 10/01/2023 CLINICAL DATA:  Left ankle pain.  Tender to touch.  Limited motion. EXAM: LEFT ANKLE - 2 VIEW COMPARISON:  Left foot radiographs 11/10/2003 FINDINGS: There is mildly decreased bone mineralization. There is a centrally lucent and peripherally sclerotic irregular lesion within the distal tibial diaphysis in a region measuring up to approximately 4.7 cm in craniocaudal dimension with serpiginous morphology suggesting a remote/chronic infarct. The ankle mortise is symmetric and intact. Minimal distal anterior tibial plafond degenerative spurring. Mild plantar calcaneal heel spur. Mild dorsal talonavicular degenerative osteophytosis. Mild pes planus. Moderate atherosclerotic calcifications. Mild calcific density suggestive of the fibula without  regional lateral ankle soft tissue swelling, likely chronic. No acute fracture is seen.  No dislocation. IMPRESSION: 1. No acute fracture. 2. Mild plantar calcaneal heel spur. 3. Mild talonavicular osteoarthritis. 4. 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 Electronically Signed   By: Tanda Lyons M.D.   On: 10/01/2023 12:53     LOS: 3 days   Donalda Applebaum, MD  [facility]    To contact the attending provider between 7A-7P or the covering provider during after hours 7P-7A, please log into the web site [email or web address] and access using universal  password for that web site. If you do not have the password, please call the hospital operator.  10/02/2023, 10:34 AM

## 2023-10-03 DIAGNOSIS — N183 Chronic kidney disease, stage 3 unspecified: Secondary | ICD-10-CM | POA: Diagnosis not present

## 2023-10-03 DIAGNOSIS — R55 Syncope and collapse: Secondary | ICD-10-CM | POA: Diagnosis not present

## 2023-10-03 DIAGNOSIS — I428 Other cardiomyopathies: Secondary | ICD-10-CM | POA: Diagnosis not present

## 2023-10-03 DIAGNOSIS — I10 Essential (primary) hypertension: Secondary | ICD-10-CM | POA: Diagnosis not present

## 2023-10-03 LAB — GLUCOSE, CAPILLARY: Glucose-Capillary: 105 mg/dL — ABNORMAL HIGH (ref 70–99)

## 2023-10-03 NOTE — Progress Notes (Signed)
 PROGRESS NOTE        PATIENT DETAILS Name: Don [name] Age: 78 y.o. Sex: male Date of Birth: 08-08-1946 Admit Date: 09/28/2023 Admitting Physician Emeline CHRISTELLA Idol, DO ERE:[place], [facility]  Brief Summary: Patient is a 78 y.o.  male with history of HFrEF with recovered EF, HTN, liver cirrhosis, CKD stage IIIb who presented with syncope-found to have orthostatic hypotension.  Significant events: 1/3>> admit to [facility]  Significant studies: 1/4>> CT head: No acute findings 1/4>> CXR: No PNA 1/4>> echo: EF 55-60%, grade 1 diastolic dysfunction 1/4>> EEG: No seizures 1/4>> TSH: 3.31 1/4>> NH4: 12 1/4>> UDS: Negative 1/6>> RPR: Non reactive 1/6>> a.m. cortisol: 8.4 1/6>> vitamin B12: 155 (low)  Significant microbiology data: None  Procedures: None  Consults: None  Subjective: Less pain in left ankle after starting steroids 1/7.  Awaiting SNF bed.  No complaints this morning.  Objective: Vitals: Blood pressure 128/60, pulse (!) 57, temperature 97.8 F (36.6 C), resp. rate 12, height 6' (1.829 m), weight 72.7 kg, SpO2 98%.   Exam: Gen Exam:Alert awake-not in any distress HEENT:atraumatic, normocephalic Chest: B/L clear to auscultation anteriorly CVS:S1S2 regular Abdomen:soft non tender, non distended Extremities:no edema Neurology: Non focal Skin: no rash  Pertinent Labs/Radiology:    Latest Ref Rng & Units 09/29/2023    4:19 AM 09/29/2023   12:50 AM 09/29/2023   12:39 AM  CBC  WBC 4.0 - 10.5 K/uL 8.2     Hemoglobin 13.0 - 17.0 g/dL 89.1  89.0  89.0   Hematocrit 39.0 - 52.0 % 32.7  32.0  32.0   Platelets 150 - 400 K/uL 391       Lab Results  Component Value Date   NA 134 (L) 10/01/2023   K 3.7 10/01/2023   CL 101 10/01/2023   CO2 24 10/01/2023     Assessment/Plan: Syncope Occurred when he stood up and went to the bathroom to urinate Suspect orthostatic mechanism (orthostatic vital signs +ve on 1/4-on  Lasix /Entresto /beta-blocker at home)-although vasovagal is on the differential as well. Volume status stable after IVF hydration Place compression stockings Continue to hold all antihypertensives  ?  Confusion Suspect he is mostly a poor historian-seems a bit better today  Workup as above  Supportive care-outpatient follow-up with neurology  Vitamin B12 deficiency SQ supplementation started 1/6   History of HFrEF with recovered EF Euvolemic on exam Given declining health-orthostatic hypotension-syncope-stopping diuretics/beta-blocker/Entresto  for now  CKD stage IIIb At baseline  HTN BP stable-in fact soft this morning Diuretics/antihypertensives on hold  HLD Statin  ?  Liver cirrhosis He does not have any stigmata of liver disease clinically Check INR with a.m. labs-albumin  is on the lower side. Reported history of HCV in the past  Left ankle pain No history of trauma-has a history of gout-no swelling/erythema-doubt infection. Left ankle x-ray-no fracture-?  Gout-trial of steroids x 5 days started on 1/7 with some clinical improvement.  Debility/deconditioning Apparently has had a significant decline in function over the past several months-he is mostly wheelchair-bound.  Although discharge summary in 2020 does mention ambulatory dysfunction. PT/OT Awaiting SNF.  BMI: Estimated body mass index is 21.74 kg/m as calculated from the following:   Height as of this encounter: 6' (1.829 m).   Weight as of this encounter: 72.7 kg.   Code status:   Code Status: Full Code  DVT Prophylaxis: Place TED hose Start: 09/30/23 1015 enoxaparin  (LOVENOX ) injection 40 mg Start: 09/29/23 1000    Family Communication: Sister-Dianne Medoza-[phone]-updated 1/6 over the phone.   Disposition Plan: Status is: Inpatient Remains inpatient appropriate because: Severity of illness   Planned Discharge Destination:Skilled nursing facility   Diet: Diet Order             Diet  Heart Room service appropriate? Yes; Fluid consistency: Thin  Diet effective now                     Antimicrobial agents: Anti-infectives (From admission, onward)    None        MEDICATIONS: Scheduled Meds:  allopurinol   150 mg Oral Daily   cyanocobalamin   1,000 mcg Subcutaneous Daily   enoxaparin  (LOVENOX ) injection  40 mg Subcutaneous Q24H   pantoprazole   40 mg Oral Daily   pravastatin   40 mg Oral Daily   predniSONE   40 mg Oral Q breakfast   sodium chloride  flush  3 mL Intravenous Q12H   Continuous Infusions:   PRN Meds:.acetaminophen  **OR** acetaminophen , ondansetron  **OR** ondansetron  (ZOFRAN ) IV   I have personally reviewed following labs and imaging studies  LABORATORY DATA: CBC: Recent Labs  Lab 09/28/23 2356 09/29/23 0039 09/29/23 0050 09/29/23 0419  WBC 8.1  --   --  8.2  NEUTROABS 6.0  --   --   --   HGB 11.0* 10.9* 10.9* 10.8*  HCT 33.0* 32.0* 32.0* 32.7*  MCV 92.4  --   --  92.4  PLT 436*  --   --  391    Basic Metabolic Panel: Recent Labs  Lab 09/28/23 2356 09/29/23 0039 09/29/23 0050 09/29/23 0419 09/30/23 0528 10/01/23 0457  NA 135 136 136 134* 136 134*  K 3.7 3.7 3.8 3.8 3.8 3.7  CL 94* 97*  --  97* 102 101  CO2 27  --   --  28 27 24   GLUCOSE 177* 151*  --  143* 128* 108*  BUN 17 18  --  19 15 10   CREATININE 1.65* 1.60*  --  1.56* 1.28* 1.21  CALCIUM  9.4  --   --  9.0 8.5* 8.5*    GFR: Estimated Creatinine Clearance: 52.6 mL/min (by C-G formula based on SCr of 1.21 mg/dL).  Liver Function Tests: Recent Labs  Lab 09/28/23 2356  AST 39  ALT 43  ALKPHOS 132*  BILITOT 0.5  PROT 7.2  ALBUMIN  2.0*   No results for input(s): LIPASE, AMYLASE in the last 168 hours. Recent Labs  Lab 09/29/23 0032  AMMONIA 12    Coagulation Profile: Recent Labs  Lab 10/01/23 0457  INR 1.2    Cardiac Enzymes: No results for input(s): CKTOTAL, CKMB, CKMBINDEX, TROPONINI in the last 168 hours.  BNP (last 3  results) No results for input(s): PROBNP in the last 8760 hours.  Lipid Profile: No results for input(s): CHOL, HDL, LDLCALC, TRIG, CHOLHDL, LDLDIRECT in the last 72 hours.  Thyroid  Function Tests: No results for input(s): TSH, T4TOTAL, FREET4, T3FREE, THYROIDAB in the last 72 hours.   Anemia Panel: Recent Labs    10/01/23 0457  VITAMINB12 155*    Urine analysis:    Component Value Date/Time   COLORURINE YELLOW 09/29/2023 1324   APPEARANCEUR CLEAR 09/29/2023 1324   LABSPEC 1.018 09/29/2023 1324   PHURINE 5.0 09/29/2023 1324   GLUCOSEU >=500 (A) 09/29/2023 1324   HGBUR NEGATIVE 09/29/2023 1324   BILIRUBINUR NEGATIVE 09/29/2023 1324   KETONESUR  NEGATIVE 09/29/2023 1324   PROTEINUR NEGATIVE 09/29/2023 1324   UROBILINOGEN 1.0 02/01/2012 1300   NITRITE NEGATIVE 09/29/2023 1324   LEUKOCYTESUR NEGATIVE 09/29/2023 1324    Sepsis Labs: Lactic Acid, Venous No results found for: LATICACIDVEN  MICROBIOLOGY: No results found for this or any previous visit (from the past 240 hours).  RADIOLOGY STUDIES/RESULTS: No results found.    LOS: 4 days   Donalda Applebaum, MD  [facility]    To contact the attending provider between 7A-7P or the covering provider during after hours 7P-7A, please log into the web site [email or web address] and access using universal [place] password for that web site. If you do not have the password, please call the hospital operator.  10/03/2023, 12:00 PM

## 2023-10-03 NOTE — Progress Notes (Signed)
 Occupational Therapy Treatment Patient Details Name: METE PURDUM MRN: [id number] DOB: 1946/02/23 Today's Date: 10/03/2023   History of present illness Pt is a 78 y.o. male who presented 09/28/23 with AMS. Head CT showed no evidence of acute intracranial abnormality. PMH: aortic atherosclerosis, chronic HFrEF, CKD stage 3a, cirrhosis, cardiomyopathy, cerebrovascular disease, HTN, gout, hepatitis C, HLD   OT comments  Pt is making fair progress towards their acute OT goals. He remains limited by bilat foot pain. He needed min A to stand but was unable to progress pivotal stepping. Therefore he demonstrated a lateral scoot/squat pivot transfer from bed>chair with min A to block his feet. Pt declined further participation in ADLs from recliner. OT to continue to follow acutely to facilitate progress towards established goals. Pt will continue to benefit from skilled inpatient follow up therapy, <3 hours/day.       If plan is discharge home, recommend the following:  A lot of help with walking and/or transfers;A lot of help with bathing/dressing/bathroom;Assistance with cooking/housework;Assistance with feeding;Direct supervision/assist for medications management;Direct supervision/assist for financial management;Assist for transportation;Help with stairs or ramp for entrance   Equipment Recommendations  None recommended by OT       Precautions / Restrictions Precautions Precautions: Fall Precaution Comments: watch BP Restrictions Weight Bearing Restrictions Per Provider Order: No       Mobility Bed Mobility Overal bed mobility: Needs Assistance Bed Mobility: Supine to Sit     Supine to sit: Contact guard     General bed mobility comments: incr time    Transfers Overall transfer level: Needs assistance Equipment used: Rolling walker (2 wheels) Transfers: Sit to/from Stand, Bed to chair/wheelchair/BSC Sit to Stand: Min assist   Squat pivot transfers: Min assist        General transfer comment: Once standing, pt declined pivotal stepping. completed a lateral scoot transfer from bed to recliner with min A to block his feet     Balance Overall balance assessment: Needs assistance Sitting-balance support: Feet supported Sitting balance-Leahy Scale: Good     Standing balance support: Bilateral upper extremity supported, During functional activity Standing balance-Leahy Scale: Poor                             ADL either performed or assessed with clinical judgement   ADL Overall ADL's : Needs assistance/impaired     Grooming: Set up;Sitting Grooming Details (indicate cue type and reason): good unsupported sitting balance                 Toilet Transfer: Minimal assistance;Stand-pivot;Squat-pivot Toilet Transfer Details (indicate cue type and reason): initally stood with RW, but reported too much pain to take pivotal steps. pt completed modified squat pviot/lateral transfer from bed/drop arm recliner         Functional mobility during ADLs: Minimal assistance General ADL Comments: continues to be limited by bilat foot pain    Extremity/Trunk Assessment Upper Extremity Assessment Upper Extremity Assessment: Generalized weakness   Lower Extremity Assessment Lower Extremity Assessment: Defer to PT evaluation        Vision   Vision Assessment?: No apparent visual deficits   Perception Perception Perception: Not tested   Praxis Praxis Praxis: Not tested    Cognition Arousal: Alert Behavior During Therapy: Flat affect Overall Cognitive Status: Impaired/Different from baseline Area of Impairment: Orientation, Attention, Memory, Following commands, Safety/judgement, Awareness, Problem solving  Orientation Level: Disoriented to, Situation Current Attention Level: Selective Memory: Decreased short-term memory Following Commands: Follows one step commands consistently, Follows one step commands with  increased time, Follows multi-step commands inconsistently, Follows multi-step commands with increased time Safety/Judgement: Decreased awareness of safety, Decreased awareness of deficits Awareness: Emergent Problem Solving: Slow processing, Difficulty sequencing, Requires verbal cues General Comments: falt affect throughout, limited insight. self-limiting behaviors.              General Comments VSS    Pertinent Vitals/ Pain       Pain Assessment Pain Assessment: Faces Faces Pain Scale: Hurts whole lot Pain Location: B feet with WB Pain Descriptors / Indicators: Discomfort, Grimacing, Guarding, Moaning Pain Intervention(s): Limited activity within patient's tolerance, Monitored during session   Frequency  Min 1X/week        Progress Toward Goals  OT Goals(current goals can now be found in the care plan section)  Progress towards OT goals: Progressing toward goals  Acute Rehab OT Goals Patient Stated Goal: to get out of hospital OT Goal Formulation: With patient Time For Goal Achievement: 10/14/23 Potential to Achieve Goals: Good ADL Goals Pt Will Perform Lower Body Dressing: with contact guard assist;sit to/from stand Pt Will Transfer to Toilet: with contact guard assist;stand pivot transfer;bedside commode Pt Will Perform Toileting - Clothing Manipulation and hygiene: with modified independence;sitting/lateral leans Additional ADL Goal #1: Pt will tolerate 5 minute OOB activity as a precursor to ADLs         AM-PAC OT 6 Clicks Daily Activity     Outcome Measure   Help from another person eating meals?: None Help from another person taking care of personal grooming?: A Little Help from another person toileting, which includes using toliet, bedpan, or urinal?: A Lot Help from another person bathing (including washing, rinsing, drying)?: A Lot Help from another person to put on and taking off regular upper body clothing?: A Little Help from another person to  put on and taking off regular lower body clothing?: A Lot 6 Click Score: 16    End of Session Equipment Utilized During Treatment: Gait belt;Rolling walker (2 wheels)  OT Visit Diagnosis: Unsteadiness on feet (R26.81);Other abnormalities of gait and mobility (R26.89);Muscle weakness (generalized) (M62.81);Pain Pain - Right/Left: Right   Activity Tolerance Patient limited by pain   Patient Left in bed;with bed alarm set;with call bell/phone within reach   Nurse Communication Mobility status        Time: 1520-1535 OT Time Calculation (min): 15 min  Charges: OT General Charges $OT Visit: 1 Visit OT Treatments $Therapeutic Activity: 8-22 mins  Lucie Kendall, OTR/L Acute Rehabilitation Services Office [phone] Secure Chat Communication Preferred   Lucie JONETTA Kendall 10/03/2023, 4:05 PM

## 2023-10-03 NOTE — TOC Progression Note (Signed)
 Transition of Care [facility]) - Progression Note    Patient Details  Name: Don [name] MRN: [id number] Date of Birth: 27-Aug-1946  [facility]) CM/SW Contact  Inocente GORMAN Kindle, LCSW Phone Number: 10/03/2023, 9:05 AM  Clinical Narrative:    9:05 AM-CSW spoke with patient's sister and grandson, Tre [phone]). They have selected [facility]. [facility] checking bed availability.    Expected Discharge Plan: Skilled Nursing Facility Barriers to Discharge: Continued Medical Work up, English As A Second Language Teacher, SNF Pending bed offer  Expected Discharge Plan and Services In-house Referral: Clinical Social Work   Post Acute Care Choice: Skilled Nursing Facility Living arrangements for the past 2 months: Single Family Home                                       Social Determinants of Health (SDOH) Interventions SDOH Screenings   Food Insecurity: No Food Insecurity (09/29/2023)  Housing: Low Risk  (09/29/2023)  Transportation Needs: No Transportation Needs (09/29/2023)  Utilities: Not At Risk (09/29/2023)  Social Connections: Unknown (09/29/2023)  Tobacco Use: Medium Risk (09/28/2023)    Readmission Risk Interventions     No data to display

## 2023-10-03 NOTE — TOC Progression Note (Signed)
 Transition of Care [facility]) - Progression Note    Patient Details  Name: Don [name] MRN: [id number] Date of Birth: March 27, 1946  [facility]) CM/SW Contact  Inocente GORMAN Kindle, LCSW Phone Number: 10/03/2023, 5:33 PM  Clinical Narrative:    Insurance approval received for [facility], Ref# [id number], effective 10/04/2023-10/06/2023. [facility] able to accept patient tomorrow.    Expected Discharge Plan: Skilled Nursing Facility Barriers to Discharge: Continued Medical Work up, English As A Second Language Teacher, SNF Pending bed offer  Expected Discharge Plan and Services In-house Referral: Clinical Social Work   Post Acute Care Choice: Skilled Nursing Facility Living arrangements for the past 2 months: Single Family Home                                       Social Determinants of Health (SDOH) Interventions SDOH Screenings   Food Insecurity: No Food Insecurity (09/29/2023)  Housing: Low Risk  (09/29/2023)  Transportation Needs: No Transportation Needs (09/29/2023)  Utilities: Not At Risk (09/29/2023)  Social Connections: Unknown (09/29/2023)  Tobacco Use: Medium Risk (09/28/2023)    Readmission Risk Interventions     No data to display

## 2023-10-04 DIAGNOSIS — N183 Chronic kidney disease, stage 3 unspecified: Secondary | ICD-10-CM | POA: Diagnosis not present

## 2023-10-04 DIAGNOSIS — I428 Other cardiomyopathies: Secondary | ICD-10-CM | POA: Diagnosis not present

## 2023-10-04 DIAGNOSIS — R55 Syncope and collapse: Secondary | ICD-10-CM | POA: Diagnosis not present

## 2023-10-04 DIAGNOSIS — I10 Essential (primary) hypertension: Secondary | ICD-10-CM | POA: Diagnosis not present

## 2023-10-04 MED ORDER — PREDNISONE 20 MG PO TABS: 40.0000 mg | ORAL_TABLET | Freq: Every day | ORAL | Status: AC

## 2023-10-04 MED ORDER — CYANOCOBALAMIN 1000 MCG/ML IJ SOLN: INTRAMUSCULAR | Status: DC

## 2023-10-04 MED ORDER — CARVEDILOL 3.125 MG PO TABS: 6.2500 mg | ORAL_TABLET | Freq: Two times a day (BID) | ORAL | Status: DC

## 2023-10-04 NOTE — Progress Notes (Signed)
 Physical Therapy Treatment Patient Details Name: Don [name] MRN: [id number] DOB: February 28, 1946 Today's Date: 10/04/2023   History of Present Illness Pt is a 78 y.o. male who presented 09/28/23 with AMS. Head CT showed no evidence of acute intracranial abnormality. PMH: aortic atherosclerosis, chronic HFrEF, CKD stage 3a, cirrhosis, cardiomyopathy, cerebrovascular disease, HTN, gout, hepatitis C, HLD    PT Comments  Received pt slouched in bed. Pt refusing OOB mobility due to bilateral foot pain and continues to be limited by pain and self-limiting behaviors. Noted pt's gown soiled and with max encouragement pt agreed to sit EOB to reposition and change gown. Performed bed mobility with supervision, increased time, and use of bed features. Removed wet gown and donned clean one. Pt seated at foot of bed - encouraged pt to attempt to stand and lateral step to  [facility], however instead pt transitioned into supine and scooted to [facility]. Continue to recommend skilled therapy services <3 hours/day. Per RN, plan for pt to D/C to SNF today.    If plan is discharge home, recommend the following: Two people to help with walking and/or transfers;A lot of help with bathing/dressing/bathroom;Assistance with cooking/housework;Direct supervision/assist for medications management;Direct supervision/assist for financial management;Assist for transportation;Help with stairs or ramp for entrance   Can travel by private vehicle     No  Equipment Recommendations  Other (comment) (TBD in next venue)    Recommendations for Other Services       Precautions / Restrictions Precautions Precautions: Fall Precaution Comments: watch BP Restrictions Weight Bearing Restrictions Per Provider Order: No     Mobility  Bed Mobility Overal bed mobility: Needs Assistance Bed Mobility: Rolling, Supine to Sit, Sit to Supine Rolling: Supervision, Used rails   Supine to sit: Supervision, HOB elevated, Used rails Sit to supine:  Supervision   General bed mobility comments: increased time and cues for rolling to use bedrails Patient Response: Flat affect  Transfers                   General transfer comment: refused to attempt    Ambulation/Gait               General Gait Details: refused to attempt   Stairs             Wheelchair Mobility     Tilt Bed Tilt Bed Patient Response: Flat affect  Modified Rankin (Stroke Patients Only)       Balance Overall balance assessment: Needs assistance Sitting-balance support: Feet supported, Bilateral upper extremity supported Sitting balance-Leahy Scale: Good Sitting balance - Comments: sitting EOB       Standing balance comment: refused to attempt                            Cognition Arousal: Alert Behavior During Therapy: Flat affect Overall Cognitive Status: Impaired/Different from baseline                         Following Commands: Follows one step commands consistently, Follows one step commands with increased time, Follows multi-step commands inconsistently, Follows multi-step commands with increased time     Problem Solving: Slow processing General Comments: falt affect throughout, self-limiting        Exercises      General Comments        Pertinent Vitals/Pain Pain Assessment Pain Assessment: Faces Faces Pain Scale: Hurts a little bit Pain Location: bilateral feet Pain Descriptors /  Indicators: Discomfort, Grimacing, Guarding, Moaning Pain Intervention(s): Limited activity within patient's tolerance, Repositioned, Monitored during session, Premedicated before session    Home Living                          Prior Function            PT Goals (current goals can now be found in the care plan section) Acute Rehab PT Goals Patient Stated Goal: to improve PT Goal Formulation: With patient/family Time For Goal Achievement: 10/13/23 Potential to Achieve Goals:  Fair Progress towards PT goals: Progressing toward goals    Frequency    Min 1X/week      PT Plan      Co-evaluation              AM-PAC PT 6 Clicks Mobility   Outcome Measure  Help needed turning from your back to your side while in a flat bed without using bedrails?: None Help needed moving from lying on your back to sitting on the side of a flat bed without using bedrails?: A Little Help needed moving to and from a bed to a chair (including a wheelchair)?: A Lot Help needed standing up from a chair using your arms (e.g., wheelchair or bedside chair)?: A Lot Help needed to walk in hospital room?: Total Help needed climbing 3-5 steps with a railing? : Total 6 Click Score: 13    End of Session   Activity Tolerance: Patient limited by pain;Other (comment) (self-limiting) Patient left: in bed;with call bell/phone within reach;with bed alarm set Nurse Communication: Mobility status PT Visit Diagnosis: Unsteadiness on feet (R26.81);Other abnormalities of gait and mobility (R26.89);Muscle weakness (generalized) (M62.81);Difficulty in walking, not elsewhere classified (R26.2);Dizziness and giddiness (R42);Pain Pain - Right/Left:  (both) Pain - part of body: Ankle and joints of foot     Time: 8962-8950 PT Time Calculation (min) (ACUTE ONLY): 12 min  Charges:    $Therapeutic Activity: 8-22 mins PT General Charges $$ ACUTE PT VISIT: 1 Visit                     Therisa Stains PT, DPT Therisa HERO Zaunegger 10/04/2023, 11:15 AM

## 2023-10-04 NOTE — Discharge Summary (Signed)
 PATIENT DETAILS Name: Don JONELLE [name] Age: 78 y.o. Sex: male Date of Birth: 02-Nov-1945 MRN: [id number]. Admitting Physician: Emeline CHRISTELLA Idol, DO ERE:[place]  Admit Date: 09/28/2023 Discharge date: 10/04/2023  Recommendations for Outpatient Follow-up:  Follow up with PCP in 1-2 weeks Please obtain CMP/CBC in one week  Admitted From:  Home  Disposition: Skilled nursing facility   Discharge Condition: fair  CODE STATUS:   Code Status: Full Code   Diet recommendation:  Diet Order             Diet - low sodium heart healthy           Diet Heart Room service appropriate? Yes; Fluid consistency: Thin  Diet effective now                    Brief Summary: Patient is a 78 y.o.  male with history of HFrEF with recovered EF, HTN, liver cirrhosis, CKD stage IIIb who presented with syncope-found to have orthostatic hypotension.   Significant events: 1/3>> admit to [facility]   Significant studies: 1/4>> CT head: No acute findings 1/4>> CXR: No PNA 1/4>> echo: EF 55-60%, grade 1 diastolic dysfunction 1/4>> EEG: No seizures 1/4>> TSH: 3.31 1/4>> NH4: 12 1/4>> UDS: Negative 1/6>> RPR: Non reactive 1/6>> a.m. cortisol: 8.4 1/6>> vitamin B12: 155 (low)   Significant microbiology data: None   Procedures: None   Consults: None  Brief Hospital Course: Syncope Occurred when he stood up and went to the bathroom to urinate Suspect orthostatic mechanism (orthostatic vital signs +ve on 1/4-on Lasix /Entresto /beta-blocker at home)-although vasovagal is on the differential as well. Volume status stable after IVF hydration Patient's antihypertensives/diuretics medication minimize-see below.   ?  Confusion Suspect he is mostly a poor historian-much better-back to baseline. Workup as above  Supportive care-outpatient follow-up with neurology   Vitamin B12 deficiency Continue SQ supplementation-see medication list below.   History of HFrEF with recovered  EF Euvolemic on exam Given declining health-orthostatic hypotension-syncope-oral diuretic/beta-blocker/Entresto  was held-blood pressure now slowly creeping up-will resume low-dose beta-blocker-continue to hold diuretics/Entresto .  Attending MD at SNF to reevaluate-resume these medications when able.    CKD stage IIIb At baseline   HTN BP initially soft-but now slowly creeping up-low-dose Coreg  being resumed-see above regarding plans to resume other medications when able.   HLD Statin   ?  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   Left ankle pain No history of trauma-has a history of gout-no swelling/erythema-doubt infection. Left ankle x-ray-no fracture-?  Gout-trial of steroids x 5 days started on 1/7 with some clinical improvement.   Debility/deconditioning Apparently has had a significant decline in function over the past several months-he is mostly wheelchair-bound.  Although discharge summary in 2020 does mention ambulatory dysfunction. PT/OT eval-SNF recommended.   BMI: Estimated body mass index is 21.74 kg/m as calculated from the following:   Height as of this encounter: 6' (1.829 m).   Weight as of this encounter: 72.7 kg.     Discharge Diagnoses:  Principal Problem:   Syncope Active Problems:   NICM (nonischemic cardiomyopathy) (HCC)   Hypertension   CKD (chronic kidney disease) stage 3, GFR 30-59 ml/min [facility])   Discharge Instructions:  Activity:  As tolerated with Full fall precautions use walker/cane & assistance as needed   Discharge Instructions     Call MD for:  extreme fatigue   Complete by: As directed  Call MD for:  persistant dizziness or light-headedness   Complete by: As directed    Diet - low sodium heart healthy   Complete by: As directed    Discharge instructions   Complete by: As directed    Follow with Primary MD  [facility], [facility] in 1-2  weeks  Please get a complete blood count and chemistry panel checked by your Primary MD at your next visit, and again as instructed by your Primary MD.  Get Medicines reviewed and adjusted: Please take all your medications with you for your next visit with your Primary MD  Laboratory/radiological data: Please request your Primary MD to go over all hospital tests and procedure/radiological results at the follow up, please ask your Primary MD to get all Hospital records sent to his/her office.  In some cases, they will be blood work, cultures and biopsy results pending at the time of your discharge. Please request that your primary care M.D. follows up on these results.  Also Note the following: If you experience worsening of your admission symptoms, develop shortness of breath, life threatening emergency, suicidal or homicidal thoughts you must seek medical attention immediately by calling 911 or calling your MD immediately  if symptoms less severe.  You must read complete instructions/literature along with all the possible adverse reactions/side effects for all the Medicines you take and that have been prescribed to you. Take any new Medicines after you have completely understood and accpet all the possible adverse reactions/side effects.   Do not drive when taking Pain medications or sleeping medications (Benzodaizepines)  Do not take more than prescribed Pain, Sleep and Anxiety Medications. It is not advisable to combine anxiety,sleep and pain medications without talking with your primary care practitioner  Special Instructions: If you have smoked or chewed Tobacco  in the last 2 yrs please stop smoking, stop any regular Alcohol  and or any Recreational drug use.  Wear Seat belts while driving.  Please note: You were cared for by a hospitalist during your hospital stay. Once you are discharged, your primary care physician will handle any further medical issues. Please note that NO REFILLS  for any discharge medications will be authorized once you are discharged, as it is imperative that you return to your primary care physician (or establish a relationship with a primary care physician if you do not have one) for your post hospital discharge needs so that they can reassess your need for medications and monitor your lab values.   Increase activity slowly   Complete by: As directed       Allergies as of 10/04/2023       Reactions   Penicillins Swelling        Medication List     STOP taking these medications    Entresto  24-26 MG Generic drug: sacubitril -valsartan    furosemide  20 MG tablet Commonly known as: LASIX    gabapentin  300 MG capsule Commonly known as: Neurontin    Jardiance 10 MG Tabs tablet Generic drug: empagliflozin   omeprazole 20 MG capsule Commonly known as: PRILOSEC   spironolactone  25 MG tablet Commonly known as: ALDACTONE        TAKE these medications    albuterol  108 (90 Base) MCG/ACT inhaler Commonly known as: VENTOLIN  HFA Inhale 1-2 puffs into the lungs every 4 (four) hours as needed for wheezing or shortness of breath.   allopurinol  100 MG tablet Commonly known as: Zyloprim  Take 1.5 tablets (150 mg total) by mouth daily.   aspirin  EC  81 MG tablet Take 81 mg by mouth daily.   Breztri Aerosphere 160-9-4.8 MCG/ACT Aero Generic drug: Budeson-Glycopyrrol-Formoterol  Inhale 2 puffs into the lungs 2 (two) times daily.   calcitRIOL  0.25 MCG capsule Commonly known as: ROCALTROL  Take 0.25 mcg by mouth daily.   carvedilol  3.125 MG tablet Commonly known as: COREG  Take 2 tablets (6.25 mg total) by mouth 2 (two) times daily with a meal. What changed:  medication strength when to take this   cholecalciferol  25 MCG (1000 UNIT) tablet Commonly known as: VITAMIN D3 Take 1,000 Units by mouth daily.   colchicine  0.6 MG tablet TAKE TWO TABLETS BY MOUTH AT FIRST SIGN OF FLARE FOLLOWED BY ONE TABLET ONE HOUR LATER (MAX THREE IN 24  HOURS)   cyanocobalamin  1000 MCG/ML injection Commonly known as: VITAMIN B12 Take daily x 3 days, then take once weekly x 4 weeks, then once a month. Start taking on: October 05, 2023   ondansetron  4 MG tablet Commonly known as: ZOFRAN  Take 1 tablet (4 mg total) by mouth every 6 (six) hours.   pantoprazole  40 MG tablet Commonly known as: PROTONIX  Take 1 tablet (40 mg total) by mouth daily.   pravastatin  40 MG tablet Commonly known as: PRAVACHOL  Take 40 mg by mouth daily.   predniSONE  20 MG tablet Commonly known as: DELTASONE  Take 2 tablets (40 mg total) by mouth daily with breakfast for 2 days. Start taking on: October 05, 2023 What changed:  medication strength how much to take when to take this        Contact information for follow-up providers     [facility], [facility] Schedule an appointment as soon as possible for a visit in 1 week(s).   Contact information: [address] [address] [phone]              Contact information for after-discharge care     Destination     [facility] .   Service: Skilled Nursing Contact information: [address] [employer] [place]  [address] [phone]                    Allergies  Allergen Reactions   Penicillins Swelling     Other Procedures/Studies: DG Ankle 2 Views Left Result Date: 10/01/2023 CLINICAL DATA:  Left ankle pain.  Tender to touch.  Limited motion. EXAM: LEFT ANKLE - 2 VIEW COMPARISON:  Left foot radiographs 11/10/2003 FINDINGS: There is mildly decreased bone mineralization. There is a centrally lucent and peripherally sclerotic irregular lesion within the distal tibial diaphysis in a region measuring up to approximately 4.7 cm in craniocaudal dimension with serpiginous morphology suggesting a remote/chronic infarct. The ankle mortise is symmetric and intact. Minimal distal anterior tibial plafond degenerative spurring.  Mild plantar calcaneal heel spur. Mild dorsal talonavicular degenerative osteophytosis. Mild pes planus. Moderate atherosclerotic calcifications. Mild calcific density suggestive of the fibula without regional lateral ankle soft tissue swelling, likely chronic. No acute fracture is seen.  No dislocation. IMPRESSION: 1. No acute fracture. 2. Mild plantar calcaneal heel spur. 3. Mild talonavicular osteoarthritis. 4. 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 Electronically Signed   By: Tanda Lyons M.D.   On: 10/01/2023 12:53   ECHOCARDIOGRAM COMPLETE Result Date: 09/29/2023    ECHOCARDIOGRAM REPORT   Patient Name:   JHACE FENNELL Date of Exam: 09/29/2023 Medical Rec #:  [id number]       Height:       72.0 in Accession #:  [id number]      Weight:       179.9 lb Date of Birth:  1945/10/02       BSA:          2.037 m Patient Age:    21 years        BP:           117/86 mmHg Patient Gender: M               HR:           64 bpm. Exam Location:  Inpatient Procedure: 2D Echo, Cardiac Doppler and Color Doppler Indications:    Syncope R55  History:        Patient has prior history of Echocardiogram examinations, most                 recent 02/14/2021. Cardiomyopathy and CHF, CKD, stage 3,                 Signs/Symptoms:Chest Pain and Syncope; Risk                 Factors:Hypertension.  Sonographer:    Thea Norlander RCS Referring Phys: EMELINE HERO GARDNER IMPRESSIONS  1. Left ventricular ejection fraction, by estimation, is 55 to 60%. The left ventricle has normal function. The left ventricle has no regional wall motion abnormalities. There is mild left ventricular hypertrophy. Left ventricular diastolic parameters are consistent with Grade I diastolic dysfunction (impaired relaxation).  2. Right ventricular systolic function is normal. The right ventricular size is normal. There is normal pulmonary artery systolic pressure.  3. The mitral valve is normal in structure. No evidence of mitral valve regurgitation. No  evidence of mitral stenosis.  4. The tricuspid valve is abnormal.  5. The aortic valve is tricuspid. There is mild calcification of the aortic valve. There is mild thickening of the aortic valve. Aortic valve regurgitation is mild. No aortic stenosis is present.  6. Aortic dilatation noted. There is mild dilatation of the ascending aorta, measuring 38 mm.  7. The inferior vena cava is normal in size with greater than 50% respiratory variability, suggesting right atrial pressure of 3 mmHg. FINDINGS  Left Ventricle: Left ventricular ejection fraction, by estimation, is 55 to 60%. The left ventricle has normal function. The left ventricle has no regional wall motion abnormalities. The left ventricular internal cavity size was normal in size. There is  mild left ventricular hypertrophy. Left ventricular diastolic parameters are consistent with Grade I diastolic dysfunction (impaired relaxation). Right Ventricle: The right ventricular size is normal. Right vetricular wall thickness was not well visualized. Right ventricular systolic function is normal. There is normal pulmonary artery systolic pressure. The tricuspid regurgitant velocity is 1.93 m/s, and with an assumed right atrial pressure of 3 mmHg, the estimated right ventricular systolic pressure is 17.9 mmHg. Left Atrium: Left atrial size was normal in size. Right Atrium: Right atrial size was normal in size. Pericardium: There is no evidence of pericardial effusion. Mitral Valve: The mitral valve is normal in structure. No evidence of mitral valve regurgitation. No evidence of mitral valve stenosis. Tricuspid Valve: The tricuspid valve is abnormal. Tricuspid valve regurgitation is mild . No evidence of tricuspid stenosis. Aortic Valve: The aortic valve is tricuspid. There is mild calcification of the aortic valve. There is mild thickening of the aortic valve. There is mild aortic valve annular calcification. Aortic valve regurgitation is mild. No aortic stenosis  is present. Aortic valve mean gradient measures 2.7 mmHg.  Aortic valve peak gradient measures 5.9 mmHg. Aortic valve area, by VTI measures 2.58 cm. Pulmonic Valve: The pulmonic valve was not well visualized. Pulmonic valve regurgitation is trivial. No evidence of pulmonic stenosis. Aorta: The aortic root is normal in size and structure and aortic dilatation noted. There is mild dilatation of the ascending aorta, measuring 38 mm. Venous: The inferior vena cava is normal in size with greater than 50% respiratory variability, suggesting right atrial pressure of 3 mmHg. IAS/Shunts: No atrial level shunt detected by color flow Doppler.  LEFT VENTRICLE PLAX 2D LVIDd:         4.50 cm   Diastology LVIDs:         3.30 cm   LV e' medial:    3.37 cm/s LV PW:         1.20 cm   LV E/e' medial:  10.7 LV IVS:        1.30 cm   LV e' lateral:   3.70 cm/s LVOT diam:     2.20 cm   LV E/e' lateral: 9.8 LV SV:         61 LV SV Index:   30 LVOT Area:     3.80 cm  RIGHT VENTRICLE             IVC RV S prime:     12.60 cm/s  IVC diam: 1.10 cm TAPSE (M-mode): 1.0 cm LEFT ATRIUM           Index        RIGHT ATRIUM           Index LA diam:      4.00 cm 1.96 cm/m   RA Area:     11.40 cm LA Vol (A2C): 49.5 ml 24.30 ml/m  RA Volume:   21.60 ml  10.61 ml/m LA Vol (A4C): 47.1 ml 23.12 ml/m  AORTIC VALVE AV Area (Vmax):    2.31 cm AV Area (Vmean):   2.46 cm AV Area (VTI):     2.58 cm AV Vmax:           121.10 cm/s AV Vmean:          73.480 cm/s AV VTI:            0.238 m AV Peak Grad:      5.9 mmHg AV Mean Grad:      2.7 mmHg LVOT Vmax:         73.57 cm/s LVOT Vmean:        47.500 cm/s LVOT VTI:          0.162 m LVOT/AV VTI ratio: 0.68  AORTA Ao Root diam: 3.60 cm Ao Asc diam:  3.80 cm MITRAL VALVE               TRICUSPID VALVE MV Area (PHT): 2.50 cm    TR Peak grad:   14.9 mmHg MV Decel Time: 303 msec    TR Vmax:        193.00 cm/s MV E velocity: 36.20 cm/s MV A velocity: 69.20 cm/s  SHUNTS MV E/A ratio:  0.52        Systemic VTI:  0.16  m                            Systemic Diam: 2.20 cm Dorn Ross MD Electronically signed by Dorn Ross MD Signature Date/Time: 09/29/2023/5:20:35 PM    Final    EEG adult Result Date:  09/29/2023 Shelton Arlin KIDD, MD     09/29/2023  7:19 AM Patient Name: PAO HAFFEY MRN: [id number] Epilepsy Attending: Arlin KIDD Shelton Referring Physician/Provider: [name] Emeline HERO, DO Date: 09/29/2023 Duration: 23.04 mins Patient history: 78yo M with ams getting eeg to evaluate for seizure Level of alertness: Awake, asleep AEDs during EEG study: None Technical aspects: This EEG study was done with scalp electrodes positioned according to the 10-20 International system of electrode placement. Electrical activity was reviewed with band pass filter of 1-70Hz , sensitivity of 7 uV/mm, display speed of 45mm/sec with a 60Hz  notched filter applied as appropriate. EEG data were recorded continuously and digitally stored.  Video monitoring was available and reviewed as appropriate. Description: The posterior dominant rhythm consists of 8-9  Hz activity of moderate voltage (25-35 uV) seen predominantly in posterior head regions, symmetric and reactive to eye opening and eye closing. Sleep was characterized by vertex waves, sleep spindles (12 to 14 Hz), maximal frontocentral region. Hyperventilation and photic stimulation were not performed.   IMPRESSION: This study is within normal limits. No seizures or epileptiform discharges were seen throughout the recording. A normal interictal EEG does not exclude the diagnosis of epilepsy. Arlin KIDD Shelton   DG Chest Port 1 View Result Date: 09/29/2023 CLINICAL DATA:  Altered mental status EXAM: PORTABLE CHEST 1 VIEW COMPARISON:  09/14/2023 FINDINGS: The heart size and mediastinal contours are within normal limits. Both lungs are clear. The visualized skeletal structures are unremarkable. IMPRESSION: No active disease. Electronically Signed   By: Oneil Devonshire M.D.   On: 09/29/2023 01:53   CT  HEAD WO CONTRAST Result Date: 09/29/2023 CLINICAL DATA:  Delirium EXAM: CT HEAD WITHOUT CONTRAST TECHNIQUE: Contiguous axial images were obtained from the base of the skull through the vertex without intravenous contrast. RADIATION DOSE REDUCTION: This exam was performed according to the departmental dose-optimization program which includes automated exposure control, adjustment of the mA and/or kV according to patient size and/or use of iterative reconstruction technique. COMPARISON:  CT head 09/14/2023. FINDINGS: Brain: No evidence of acute infarction, hemorrhage, hydrocephalus, extra-axial collection or mass lesion/mass effect. Vascular: No hyperdense vessel identified. Skull: No acute fracture. Sinuses/Orbits: Remote left medial orbital wall fracture. No acute orbital findings. Clear sinuses. Other: No mastoid effusions. IMPRESSION: Stable head CT.  No evidence of acute intracranial abnormality. Electronically Signed   By: Gilmore GORMAN Molt M.D.   On: 09/29/2023 00:50   DG Chest Port 1 View Result Date: 09/14/2023 CLINICAL DATA:  Weakness EXAM: PORTABLE CHEST 1 VIEW COMPARISON:  08/15/2023 FINDINGS: No consolidation, pneumothorax or effusion. No edema. Normal cardiopericardial silhouette. Overlapping cardiac leads. IMPRESSION: No acute cardiopulmonary disease. Electronically Signed   By: Ranell Bring M.D.   On: 09/14/2023 12:23   CT Head Wo Contrast Result Date: 09/14/2023 CLINICAL DATA:  Headache, neuro deficit EXAM: CT HEAD WITHOUT CONTRAST TECHNIQUE: Contiguous axial images were obtained from the base of the skull through the vertex without intravenous contrast. RADIATION DOSE REDUCTION: This exam was performed according to the departmental dose-optimization program which includes automated exposure control, adjustment of the mA and/or kV according to patient size and/or use of iterative reconstruction technique. COMPARISON:  CT head October 29 24. FINDINGS: Brain: No evidence of acute infarction,  hemorrhage, hydrocephalus, extra-axial collection or mass lesion/mass effect. Vascular: No hyperdense vessel. Skull: No acute fracture. Sinuses/Orbits: Clear sinuses. No acute orbital findings. Remote left medial orbital wall fracture. Other: No mastoid effusions. IMPRESSION: No evidence of acute intracranial abnormality. Electronically Signed   By: Gilmore  GORMAN Molt M.D.   On: 09/14/2023 11:42     TODAY-DAY OF DISCHARGE:  Subjective:   Don [name] today has no headache,no chest abdominal pain,no new weakness tingling or numbness, feels much better wants to go home today.   Objective:   Blood pressure (!) 137/90, pulse 62, temperature 98.4 F (36.9 C), temperature source Oral, resp. rate 15, height 6' (1.829 m), weight 72.7 kg, SpO2 96%. No intake or output data in the 24 hours ending 10/04/23 0939 Filed Weights   09/30/23 0400 10/01/23 0405 10/02/23 0500  Weight: 79.3 kg 67.4 kg 72.7 kg    Exam: Awake Alert, Oriented *3, No new F.N deficits, Normal affect [place].AT,PERRAL Supple Neck,No JVD, No cervical lymphadenopathy appriciated.  Symmetrical Chest wall movement, Good air movement bilaterally, CTAB RRR,No Gallops,Rubs or new Murmurs, No Parasternal Heave +ve B.Sounds, Abd Soft, Non tender, No organomegaly appriciated, No rebound -guarding or rigidity. No Cyanosis, Clubbing or edema, No new Rash or bruise   PERTINENT RADIOLOGIC STUDIES: No results found.   PERTINENT LAB RESULTS: CBC: No results for input(s): WBC, HGB, HCT, PLT in the last 72 hours. CMET CMP     Component Value Date/Time   NA 134 (L) 10/01/2023 0457   K 3.7 10/01/2023 0457   CL 101 10/01/2023 0457   CO2 24 10/01/2023 0457   GLUCOSE 108 (H) 10/01/2023 0457   BUN 10 10/01/2023 0457   CREATININE 1.21 10/01/2023 0457   CREATININE 1.40 (H) 06/10/2019 0854   CALCIUM  8.5 (L) 10/01/2023 0457   PROT 7.2 09/28/2023 2356   ALBUMIN  2.0 (L) 09/28/2023 2356   AST 39 09/28/2023 2356   ALT 43 09/28/2023  2356   ALKPHOS 132 (H) 09/28/2023 2356   BILITOT 0.5 09/28/2023 2356   GFRNONAA >60 10/01/2023 0457    GFR Estimated Creatinine Clearance: 52.6 mL/min (by C-G formula based on SCr of 1.21 mg/dL). No results for input(s): LIPASE, AMYLASE in the last 72 hours. No results for input(s): CKTOTAL, CKMB, CKMBINDEX, TROPONINI in the last 72 hours. Invalid input(s): POCBNP No results for input(s): DDIMER in the last 72 hours. No results for input(s): HGBA1C in the last 72 hours. No results for input(s): CHOL, HDL, LDLCALC, TRIG, CHOLHDL, LDLDIRECT in the last 72 hours. No results for input(s): TSH, T4TOTAL, T3FREE, THYROIDAB in the last 72 hours.  Invalid input(s): FREET3 No results for input(s): VITAMINB12, FOLATE, FERRITIN, TIBC, IRON, RETICCTPCT in the last 72 hours. Coags: No results for input(s): INR in the last 72 hours.  Invalid input(s): PT Microbiology: No results found for this or any previous visit (from the past 240 hours).  FURTHER DISCHARGE INSTRUCTIONS:  Get Medicines reviewed and adjusted: Please take all your medications with you for your next visit with your Primary MD  Laboratory/radiological data: Please request your Primary MD to go over all hospital tests and procedure/radiological results at the follow up, please ask your Primary MD to get all Hospital records sent to his/her office.  In some cases, they will be blood work, cultures and biopsy results pending at the time of your discharge. Please request that your primary care M.D. goes through all the records of your hospital data and follows up on these results.  Also Note the following: If you experience worsening of your admission symptoms, develop shortness of breath, life threatening emergency, suicidal or homicidal thoughts you must seek medical attention immediately by calling 911 or calling your MD immediately  if symptoms less severe.  You must read  complete instructions/literature along  with all the possible adverse reactions/side effects for all the Medicines you take and that have been prescribed to you. Take any new Medicines after you have completely understood and accpet all the possible adverse reactions/side effects.   Do not drive when taking Pain medications or sleeping medications (Benzodaizepines)  Do not take more than prescribed Pain, Sleep and Anxiety Medications. It is not advisable to combine anxiety,sleep and pain medications without talking with your primary care practitioner  Special Instructions: If you have smoked or chewed Tobacco  in the last 2 yrs please stop smoking, stop any regular Alcohol  and or any Recreational drug use.  Wear Seat belts while driving.  Please note: You were cared for by a hospitalist during your hospital stay. Once you are discharged, your primary care physician will handle any further medical issues. Please note that NO REFILLS for any discharge medications will be authorized once you are discharged, as it is imperative that you return to your primary care physician (or establish a relationship with a primary care physician if you do not have one) for your post hospital discharge needs so that they can reassess your need for medications and monitor your lab values.  Total Time spent coordinating discharge including counseling, education and face to face time equals greater than 30 minutes.  SignedBETHA Donalda Applebaum 10/04/2023 9:39 AM

## 2023-10-04 NOTE — Plan of Care (Signed)
  Problem: Education: Goal: Knowledge of condition and prescribed therapy will improve Outcome: Progressing   Problem: Cardiac: Goal: Will achieve and/or maintain adequate cardiac output Outcome: Progressing   Problem: Physical Regulation: Goal: Complications related to the disease process, condition or treatment will be avoided or minimized Outcome: Progressing   Problem: Education: Goal: Knowledge of General Education information will improve Description: Including pain rating scale, medication(s)/side effects and non-pharmacologic comfort measures Outcome: Progressing   Problem: Health Behavior/Discharge Planning: Goal: Ability to manage health-related needs will improve Outcome: Progressing   Problem: Clinical Measurements: Goal: Ability to maintain clinical measurements within normal limits will improve Outcome: Progressing Goal: Will remain free from infection Outcome: Progressing Goal: Diagnostic test results will improve Outcome: Progressing Goal: Respiratory complications will improve Outcome: Progressing Goal: Cardiovascular complication will be avoided Outcome: Progressing   Problem: Activity: Goal: Risk for activity intolerance will decrease Outcome: Progressing   Problem: Nutrition: Goal: Adequate nutrition will be maintained Outcome: Progressing   Problem: Coping: Goal: Level of anxiety will decrease Outcome: Progressing   Problem: Elimination: Goal: Will not experience complications related to bowel motility Outcome: Progressing Goal: Will not experience complications related to urinary retention Outcome: Progressing

## 2023-10-04 NOTE — TOC Progression Note (Signed)
 Transition of Care [facility]) - Progression Note    Patient Details  Name: Don [name] MRN: [id number] Date of Birth: April 12, 1946  Transition of Care [facility]) CM/SW Contact  Inocente GORMAN Kindle, [place] Phone Number: 10/04/2023, 9:55 AM  Clinical Narrative:    Suella Stains will let CSW know when to schedule transport as they are cleaning the room. CSW spoke with patient's sister and provided update. CSW met with the patient and he is in agreement with the plan. Will arrange PTAR this afternoon.   Expected Discharge Plan: Skilled Nursing Facility Barriers to Discharge: Barriers Resolved  Expected Discharge Plan and Services In-house Referral: Clinical Social Work   Post Acute Care Choice: Skilled Nursing Facility Living arrangements for the past 2 months: Single Family Home Expected Discharge Date: 10/04/23                                     Social Determinants of Health (SDOH) Interventions SDOH Screenings   Food Insecurity: No Food Insecurity (09/29/2023)  Housing: Low Risk  (09/29/2023)  Transportation Needs: No Transportation Needs (09/29/2023)  Utilities: Not At Risk (09/29/2023)  Social Connections: Unknown (09/29/2023)  Tobacco Use: Medium Risk (09/28/2023)    Readmission Risk Interventions     No data to display

## 2023-10-04 NOTE — Plan of Care (Signed)

## 2023-10-04 NOTE — TOC Transition Note (Signed)
 Transition of Care [facility]) - Discharge Note   Patient Details  Name: Don [name] MRN: [id number] Date of Birth: 03/11/1946  Transition of Care [facility]) CM/SW Contact:  Inocente GORMAN Kindle, LCSW Phone Number: 10/04/2023, 2:22 PM   Clinical Narrative:    Patient will DC to: [facility]  Anticipated DC date: 10/04/23 Family notified: Sister, Lawrnce Transport by: [place]   Per MD patient ready for DC to . RN to call report prior to discharge [phone] room B22-2). RN, patient, patient's family, and facility notified of DC. Discharge Summary and FL2 sent to facility. DC packet on chart. Ambulance transport requested for patient.   CSW will sign off for now as social work intervention is no longer needed. Please consult us  again if new needs arise.     Final next level of care: Skilled Nursing Facility Barriers to Discharge: Barriers Resolved   Patient Goals and CMS Choice Patient states their goals for this hospitalization and ongoing recovery are:: Rehab CMS Medicare.gov Compare Post Acute Care list provided to:: Patient Represenative (must comment) Choice offered to / list presented to : Sibling [place] ownership interest in [facility].provided to:: Sibling    Discharge Placement   Existing PASRR number confirmed : 10/03/23          Patient chooses bed at:  [facility]) Patient to be transferred to facility by: PTAR Name of family member notified: Sister Patient and family notified of of transfer: 10/04/23  Discharge Plan and Services Additional resources added to the After Visit Summary for   In-house Referral: Clinical Social Work   Post Acute Care Choice: Skilled Nursing Facility                               Social Drivers of Health (SDOH) Interventions SDOH Screenings   Food Insecurity: No Food Insecurity (09/29/2023)  Housing: Low Risk  (09/29/2023)  Transportation Needs: No Transportation Needs (09/29/2023)  Utilities: Not At Risk (09/29/2023)   Social Connections: Unknown (09/29/2023)  Tobacco Use: Medium Risk (09/28/2023)     Readmission Risk Interventions     No data to display

## 2023-10-13 ENCOUNTER — Other Ambulatory Visit: Payer: Self-pay

## 2023-10-13 ENCOUNTER — Emergency Department (HOSPITAL_COMMUNITY): Payer: 59

## 2023-10-13 ENCOUNTER — Inpatient Hospital Stay (HOSPITAL_COMMUNITY)
Admission: EM | Admit: 2023-10-13 | Discharge: 2023-10-16 | DRG: 641 | Disposition: A | Discharge status: Skilled Nursing Facility | Payer: 59 | Source: Skilled Nursing Facility | Attending: Family Medicine | Admitting: Family Medicine

## 2023-10-13 DIAGNOSIS — K7460 Unspecified cirrhosis of liver: Secondary | ICD-10-CM | POA: Diagnosis present

## 2023-10-13 DIAGNOSIS — I5042 Chronic combined systolic (congestive) and diastolic (congestive) heart failure: Secondary | ICD-10-CM | POA: Diagnosis present

## 2023-10-13 DIAGNOSIS — Z8673 Personal history of transient ischemic attack (TIA), and cerebral infarction without residual deficits: Secondary | ICD-10-CM

## 2023-10-13 DIAGNOSIS — R001 Bradycardia, unspecified: Secondary | ICD-10-CM | POA: Diagnosis present

## 2023-10-13 DIAGNOSIS — E440 Moderate protein-calorie malnutrition: Secondary | ICD-10-CM | POA: Diagnosis present

## 2023-10-13 DIAGNOSIS — R627 Adult failure to thrive: Secondary | ICD-10-CM | POA: Diagnosis present

## 2023-10-13 DIAGNOSIS — I9589 Other hypotension: Secondary | ICD-10-CM | POA: Diagnosis not present

## 2023-10-13 DIAGNOSIS — I130 Hypertensive heart and chronic kidney disease with heart failure and stage 1 through stage 4 chronic kidney disease, or unspecified chronic kidney disease: Secondary | ICD-10-CM | POA: Diagnosis present

## 2023-10-13 DIAGNOSIS — E785 Hyperlipidemia, unspecified: Secondary | ICD-10-CM | POA: Diagnosis present

## 2023-10-13 DIAGNOSIS — E860 Dehydration: Secondary | ICD-10-CM | POA: Diagnosis not present

## 2023-10-13 DIAGNOSIS — Z6822 Body mass index (BMI) 22.0-22.9, adult: Secondary | ICD-10-CM

## 2023-10-13 DIAGNOSIS — R509 Fever, unspecified: Secondary | ICD-10-CM | POA: Diagnosis not present

## 2023-10-13 DIAGNOSIS — I493 Ventricular premature depolarization: Secondary | ICD-10-CM | POA: Diagnosis present

## 2023-10-13 DIAGNOSIS — Z87891 Personal history of nicotine dependence: Secondary | ICD-10-CM

## 2023-10-13 DIAGNOSIS — M109 Gout, unspecified: Secondary | ICD-10-CM | POA: Diagnosis present

## 2023-10-13 DIAGNOSIS — D631 Anemia in chronic kidney disease: Secondary | ICD-10-CM | POA: Diagnosis present

## 2023-10-13 DIAGNOSIS — Z1152 Encounter for screening for COVID-19: Secondary | ICD-10-CM

## 2023-10-13 DIAGNOSIS — Z66 Do not resuscitate: Secondary | ICD-10-CM | POA: Diagnosis present

## 2023-10-13 DIAGNOSIS — R54 Age-related physical debility: Secondary | ICD-10-CM | POA: Diagnosis present

## 2023-10-13 DIAGNOSIS — N1831 Chronic kidney disease, stage 3a: Secondary | ICD-10-CM | POA: Diagnosis present

## 2023-10-13 DIAGNOSIS — I429 Cardiomyopathy, unspecified: Secondary | ICD-10-CM | POA: Diagnosis present

## 2023-10-13 DIAGNOSIS — Z8249 Family history of ischemic heart disease and other diseases of the circulatory system: Secondary | ICD-10-CM | POA: Diagnosis not present

## 2023-10-13 DIAGNOSIS — E538 Deficiency of other specified B group vitamins: Secondary | ICD-10-CM | POA: Diagnosis present

## 2023-10-13 DIAGNOSIS — E861 Hypovolemia: Secondary | ICD-10-CM | POA: Diagnosis present

## 2023-10-13 DIAGNOSIS — I959 Hypotension, unspecified: Secondary | ICD-10-CM | POA: Diagnosis present

## 2023-10-13 DIAGNOSIS — I472 Ventricular tachycardia, unspecified: Secondary | ICD-10-CM | POA: Diagnosis present

## 2023-10-13 DIAGNOSIS — Z880 Allergy status to penicillin: Secondary | ICD-10-CM

## 2023-10-13 DIAGNOSIS — Z79899 Other long term (current) drug therapy: Secondary | ICD-10-CM | POA: Diagnosis not present

## 2023-10-13 DIAGNOSIS — Z993 Dependence on wheelchair: Secondary | ICD-10-CM

## 2023-10-13 DIAGNOSIS — B1920 Unspecified viral hepatitis C without hepatic coma: Secondary | ICD-10-CM | POA: Diagnosis present

## 2023-10-13 DIAGNOSIS — Z7982 Long term (current) use of aspirin: Secondary | ICD-10-CM | POA: Diagnosis not present

## 2023-10-13 DIAGNOSIS — A419 Sepsis, unspecified organism: Secondary | ICD-10-CM | POA: Diagnosis present

## 2023-10-13 DIAGNOSIS — E8809 Other disorders of plasma-protein metabolism, not elsewhere classified: Secondary | ICD-10-CM | POA: Diagnosis present

## 2023-10-13 LAB — CBC WITH DIFFERENTIAL/PLATELET
Abs Immature Granulocytes: 0.04 10*3/uL (ref 0.00–0.07)
Basophils Absolute: 0 10*3/uL (ref 0.0–0.1)
Basophils Relative: 0 %
Eosinophils Absolute: 0 10*3/uL (ref 0.0–0.5)
Eosinophils Relative: 0 %
HCT: 24.6 % — ABNORMAL LOW (ref 39.0–52.0)
Hemoglobin: 8.1 g/dL — ABNORMAL LOW (ref 13.0–17.0)
Immature Granulocytes: 1 %
Lymphocytes Relative: 10 %
Lymphs Abs: 0.5 10*3/uL — ABNORMAL LOW (ref 0.7–4.0)
MCH: 30.7 pg (ref 26.0–34.0)
MCHC: 32.9 g/dL (ref 30.0–36.0)
MCV: 93.2 fL (ref 80.0–100.0)
Monocytes Absolute: 0.4 10*3/uL (ref 0.1–1.0)
Monocytes Relative: 7 %
Neutro Abs: 4.3 10*3/uL (ref 1.7–7.7)
Neutrophils Relative %: 82 %
Platelets: 209 10*3/uL (ref 150–400)
RBC: 2.64 MIL/uL — ABNORMAL LOW (ref 4.22–5.81)
RDW: 14.6 % (ref 11.5–15.5)
WBC: 5.2 10*3/uL (ref 4.0–10.5)
nRBC: 0 % (ref 0.0–0.2)

## 2023-10-13 LAB — COMPREHENSIVE METABOLIC PANEL
ALT: 71 U/L — ABNORMAL HIGH (ref 0–44)
AST: 78 U/L — ABNORMAL HIGH (ref 15–41)
Albumin: 1.7 g/dL — ABNORMAL LOW (ref 3.5–5.0)
Alkaline Phosphatase: 245 U/L — ABNORMAL HIGH (ref 38–126)
Anion gap: 5 (ref 5–15)
BUN: 18 mg/dL (ref 8–23)
CO2: 26 mmol/L (ref 22–32)
Calcium: 8.2 mg/dL — ABNORMAL LOW (ref 8.9–10.3)
Chloride: 100 mmol/L (ref 98–111)
Creatinine, Ser: 1.32 mg/dL — ABNORMAL HIGH (ref 0.61–1.24)
GFR, Estimated: 56 mL/min — ABNORMAL LOW (ref >60)
Glucose, Bld: 104 mg/dL — ABNORMAL HIGH (ref 70–99)
Potassium: 3.9 mmol/L (ref 3.5–5.1)
Sodium: 131 mmol/L — ABNORMAL LOW (ref 135–145)
Total Bilirubin: 1 mg/dL (ref 0.0–1.2)
Total Protein: 5.6 g/dL — ABNORMAL LOW (ref 6.5–8.1)

## 2023-10-13 LAB — URINALYSIS, W/ REFLEX TO CULTURE (INFECTION SUSPECTED)
Bacteria, UA: NONE SEEN
Bilirubin Urine: NEGATIVE
Glucose, UA: NEGATIVE mg/dL
Ketones, ur: NEGATIVE mg/dL
Leukocytes,Ua: NEGATIVE
Nitrite: NEGATIVE
Protein, ur: NEGATIVE mg/dL
Specific Gravity, Urine: 1.016 (ref 1.005–1.030)
pH: 5 (ref 5.0–8.0)

## 2023-10-13 LAB — LACTIC ACID, PLASMA
Lactic Acid, Venous: 1.6 mmol/L (ref 0.5–1.9)
Lactic Acid, Venous: 1 mmol/L (ref 0.5–1.9)

## 2023-10-13 LAB — RESP PANEL BY RT-PCR (RSV, FLU A&B, COVID)  RVPGX2
Influenza A by PCR: NEGATIVE
Influenza B by PCR: NEGATIVE
Resp Syncytial Virus by PCR: NEGATIVE
SARS Coronavirus 2 by RT PCR: NEGATIVE

## 2023-10-13 LAB — AMMONIA: Ammonia: 26 umol/L (ref 9–35)

## 2023-10-13 LAB — PROTIME-INR
INR: 1.3 — ABNORMAL HIGH (ref 0.8–1.2)
Prothrombin Time: 16.8 s — ABNORMAL HIGH (ref 11.4–15.2)

## 2023-10-13 LAB — APTT: aPTT: 36 s (ref 24–36)

## 2023-10-13 MED ORDER — ACETAMINOPHEN 500 MG PO TABS: 500.0000 mg | ORAL_TABLET | Freq: Four times a day (QID) | ORAL | Status: DC | PRN

## 2023-10-13 MED ORDER — MIDODRINE HCL 5 MG PO TABS
5.0000 mg | ORAL_TABLET | Freq: Three times a day (TID) | ORAL | Status: DC
  Administered 2023-10-14 – 2023-10-16 (×9): 5 mg via ORAL
  Filled 2023-10-13 (×9): qty 1

## 2023-10-13 MED ORDER — SODIUM CHLORIDE 0.9% FLUSH
3.0000 mL | Freq: Two times a day (BID) | INTRAVENOUS | Status: DC
  Administered 2023-10-14 – 2023-10-16 (×6): 3 mL via INTRAVENOUS

## 2023-10-13 MED ORDER — POLYETHYLENE GLYCOL 3350 17 G PO PACK: 17.0000 g | PACK | Freq: Every day | ORAL | Status: DC | PRN

## 2023-10-13 MED ORDER — SODIUM CHLORIDE 0.9 % IV BOLUS
2000.0000 mL | Freq: Once | INTRAVENOUS | Status: AC
  Administered 2023-10-13: 1000 mL via INTRAVENOUS

## 2023-10-13 MED ORDER — MELATONIN 3 MG PO TABS: 6.0000 mg | ORAL_TABLET | Freq: Every evening | ORAL | Status: DC | PRN

## 2023-10-13 MED ORDER — ALBUMIN HUMAN 25 % IV SOLN
25.0000 g | Freq: Four times a day (QID) | INTRAVENOUS | Status: AC
  Administered 2023-10-14 (×4): 25 g via INTRAVENOUS
  Filled 2023-10-13 (×4): qty 100

## 2023-10-13 MED ORDER — SODIUM CHLORIDE 0.9 % IV BOLUS
500.0000 mL | Freq: Once | INTRAVENOUS | Status: AC
  Administered 2023-10-13: 500 mL via INTRAVENOUS

## 2023-10-13 MED ORDER — COSYNTROPIN 0.25 MG IJ SOLR
0.2500 mg | Freq: Once | INTRAMUSCULAR | Status: AC
  Administered 2023-10-14: 0.25 mg via INTRAVENOUS
  Filled 2023-10-13: qty 0.25

## 2023-10-13 MED ORDER — ENSURE ENLIVE PO LIQD
237.0000 mL | Freq: Two times a day (BID) | ORAL | Status: DC
  Filled 2023-10-13 (×2): qty 237

## 2023-10-13 MED ORDER — ALBUTEROL SULFATE (2.5 MG/3ML) 0.083% IN NEBU: 2.5000 mg | INHALATION_SOLUTION | RESPIRATORY_TRACT | Status: DC | PRN

## 2023-10-13 MED ORDER — ONDANSETRON HCL 4 MG/2ML IJ SOLN: 4.0000 mg | Freq: Four times a day (QID) | INTRAMUSCULAR | Status: DC | PRN

## 2023-10-13 MED ORDER — SODIUM CHLORIDE 0.9 % IV SOLN
2.0000 g | Freq: Once | INTRAVENOUS | Status: AC
  Administered 2023-10-13: 2 g via INTRAVENOUS
  Filled 2023-10-13: qty 20

## 2023-10-13 MED ORDER — VANCOMYCIN HCL 1500 MG/300ML IV SOLN
1500.0000 mg | Freq: Once | INTRAVENOUS | Status: AC
  Administered 2023-10-14: 1500 mg via INTRAVENOUS
  Filled 2023-10-13: qty 300

## 2023-10-13 MED ORDER — METRONIDAZOLE 500 MG/100ML IV SOLN
500.0000 mg | Freq: Two times a day (BID) | INTRAVENOUS | Status: DC
  Administered 2023-10-14 – 2023-10-15 (×5): 500 mg via INTRAVENOUS
  Filled 2023-10-13 (×5): qty 100

## 2023-10-13 NOTE — Progress Notes (Signed)
CODE SEPSIS - PHARMACY COMMUNICATION  **Broad Spectrum Antibiotics should be administered within 1 hour of Sepsis diagnosis**  Time Code Sepsis Called/Page Received: 2005  Antibiotics Ordered: ceftriaxone  Time of 1st antibiotic administration: 2028  Additional action taken by pharmacy: None  If necessary, Name of Provider/Nurse Contacted: None    Rockwell Alexandria ,PharmD Clinical Pharmacist  10/13/2023  8:08 PM

## 2023-10-13 NOTE — ED Notes (Signed)
Second set of cultures obtained before antibiotics given

## 2023-10-13 NOTE — ED Provider Notes (Signed)
[facility] AT [facility] Provider Note   CSN: [id number] Arrival date & time: 10/13/23  1937     History {Add pertinent medical, surgical, social history, OB history to HPI:1} Chief Complaint  Patient presents with   Possible Sepsis    Don [name] is a 78 y.o. male.  Patient has a history of hypertension hyperlipidemia hepatitis C and cardiomyopathy.  He stays in a nursing home and he felt bad and his temperature was 104 in the nursing home.  They gave him some Tylenol   Weakness      Home Medications Prior to Admission medications   Medication Sig Start Date End Date Taking? Authorizing Provider  albuterol (VENTOLIN HFA) 108 (90 Base) MCG/ACT inhaler Inhale 1-2 puffs into the lungs every 4 (four) hours as needed for wheezing [name] shortness of breath. Patient not taking: Reported on 10/01/2023 08/20/23   [provider]  allopurinol (ZYLOPRIM) 100 MG tablet Take 1.5 tablets (150 mg total) by mouth daily. 08/04/23 08/03/24  Vassie Loll, MD  aspirin EC 81 MG tablet Take 81 mg by mouth daily.    [provider]  BREZTRI AEROSPHERE 160-9-4.8 MCG/ACT AERO Inhale 2 puffs into the lungs 2 (two) times daily. Patient not taking: Reported on 10/01/2023 08/20/23   [provider]  calcitRIOL (ROCALTROL) 0.25 MCG capsule Take 0.25 mcg by mouth daily.    [provider]  carvedilol (COREG) 3.125 MG tablet Take 2 tablets (6.25 mg total) by mouth 2 (two) times daily with a meal. 10/04/23   Ghimire, Werner Lean, MD  cholecalciferol (VITAMIN D3) 25 MCG (1000 UNIT) tablet Take 1,000 Units by mouth daily.    [provider]  colchicine 0.6 MG tablet TAKE TWO TABLETS BY MOUTH AT FIRST SIGN OF FLARE FOLLOWED BY ONE TABLET ONE HOUR LATER (MAX THREE IN 24 HOURS) 08/13/23   [provider]  cyanocobalamin (VITAMIN B12) 1000 MCG/ML injection Take daily x 3 days, then take once weekly x 4 weeks, then once a month. 10/05/23    Ghimire, Werner Lean, MD  ondansetron (ZOFRAN) 4 MG tablet Take 1 tablet (4 mg total) by mouth every 6 (six) hours. 07/24/23   Carmel Sacramento A, PA-C  pantoprazole (PROTONIX) 40 MG tablet Take 1 tablet (40 mg total) by mouth daily. 08/05/23   Vassie Loll, MD  pravastatin (PRAVACHOL) 40 MG tablet Take 40 mg by mouth daily.    [provider]      Allergies    Penicillins    Review of Systems   Review of Systems  Neurological:  Positive for weakness.    Physical Exam Updated Vital Signs BP (!) 89/59   Pulse 82   Temp 99.7 F (37.6 C) (Oral)   Resp 18   Ht 6' (1.829 m)   Wt 72.6 kg   SpO2 98%   BMI 21.70 kg/m  Physical Exam  ED Results / Procedures / Treatments   Labs (all labs ordered are listed, but only abnormal results are displayed) Labs Reviewed  COMPREHENSIVE METABOLIC PANEL - Abnormal; Notable for the following components:      Result Value   Sodium 131 (*)    Glucose, Bld 104 (*)    Creatinine, Ser 1.32 (*)    Calcium 8.2 (*)    Total Protein 5.6 (*)    Albumin 1.7 (*)    AST 78 (*)    ALT 71 (*)    Alkaline Phosphatase 245 (*)    GFR,  Estimated 56 (*)    All other components within normal limits  CBC WITH DIFFERENTIAL/PLATELET - Abnormal; Notable for the following components:   RBC 2.64 (*)    Hemoglobin 8.1 (*)    HCT 24.6 (*)    Lymphs Abs 0.5 (*)    All other components within normal limits  PROTIME-INR - Abnormal; Notable for the following components:   Prothrombin Time 16.8 (*)    INR 1.3 (*)    All other components within normal limits  URINALYSIS, W/ REFLEX TO CULTURE (INFECTION SUSPECTED) - Abnormal; Notable for the following components:   Color, Urine AMBER (*)    Hgb urine dipstick MODERATE (*)    All other components within normal limits  RESP PANEL BY RT-PCR (RSV, FLU A&B, COVID)  RVPGX2  CULTURE, BLOOD (ROUTINE X 2)  CULTURE, BLOOD (ROUTINE X 2)  LACTIC ACID, PLASMA  LACTIC ACID, PLASMA  APTT  AMMONIA     EKG None  Radiology DG Chest Port 1 View Result Date: 10/13/2023 CLINICAL DATA:  Sepsis, febrile EXAM: PORTABLE CHEST 1 VIEW COMPARISON:  09/29/2023 FINDINGS: Single frontal view of the chest demonstrates a stable cardiac silhouette. Chronic elevation of the right hemidiaphragm. Left hemidiaphragm is excluded by collimation. No airspace disease, effusion, [name] pneumothorax. No acute bony abnormalities. IMPRESSION: 1. Stable chest, no acute process. Electronically Signed   By: Sharlet Salina M.D.   On: 10/13/2023 20:30    Procedures Procedures  {Document cardiac monitor, telemetry assessment procedure when appropriate:1}  Medications Ordered in ED Medications  sodium chloride 0.9 % bolus 2,000 mL (0 mLs Intravenous Stopped 10/13/23 2148)  cefTRIAXone (ROCEPHIN) 2 g in sodium chloride 0.9 % 100 mL IVPB (0 g Intravenous Stopped 10/13/23 2058)  sodium chloride 0.9 % bolus 500 mL (500 mLs Intravenous New Bag/Given 10/13/23 2250)    ED Course/ Medical Decision Making/ A&P   {   Click here for ABCD2, HEART and other calculatorsREFRESH Note before signing :1}                              Medical Decision Making Amount and/[name] Complexity of Data Reviewed Labs: ordered. Radiology: ordered.  Risk Decision regarding hospitalization.   Patient with hypotension and fever at the nursing home.  He will be admitted for possible sepsis by the hospitalist  {Document critical care time when appropriate:1} {Document review of labs and clinical decision tools ie heart score, Chads2Vasc2 etc:1}  {Document your independent review of radiology images, and any outside records:1} {Document your discussion with family members, caretakers, and with consultants:1} {Document social determinants of health affecting pt's care:1} {Document your decision making why [name] why not admission, treatments were needed:1} Final Clinical Impression(s) / ED Diagnoses Final diagnoses:  Hypotension, unspecified  hypotension type    Rx / DC Orders ED Discharge Orders     None

## 2023-10-13 NOTE — Sepsis Progress Note (Signed)
Elink following for sepsis protocol. 

## 2023-10-13 NOTE — ED Triage Notes (Signed)
Pt BIB [facility] for rule out of the flu or sepsis. Pt started feeling bad yesterday. Facility took temperature and it read 104. 650 mg Tylenol was given at 1825. EMS too temp of 99.6. Pt denies N/V/D. Pt complains of not feeling good. Pain rated 10/10.

## 2023-10-14 ENCOUNTER — Inpatient Hospital Stay (HOSPITAL_COMMUNITY): Payer: 59

## 2023-10-14 DIAGNOSIS — R509 Fever, unspecified: Secondary | ICD-10-CM

## 2023-10-14 DIAGNOSIS — I9589 Other hypotension: Secondary | ICD-10-CM

## 2023-10-14 DIAGNOSIS — A419 Sepsis, unspecified organism: Secondary | ICD-10-CM | POA: Diagnosis not present

## 2023-10-14 LAB — RESPIRATORY PANEL BY PCR

## 2023-10-14 LAB — PHOSPHORUS: Phosphorus: 3.7 mg/dL (ref 2.5–4.6)

## 2023-10-14 LAB — CBC
HCT: 25.6 % — ABNORMAL LOW (ref 39.0–52.0)
Hemoglobin: 8.3 g/dL — ABNORMAL LOW (ref 13.0–17.0)
MCH: 31 pg (ref 26.0–34.0)
MCHC: 32.4 g/dL (ref 30.0–36.0)
MCV: 95.5 fL (ref 80.0–100.0)
Platelets: 203 10*3/uL (ref 150–400)
RBC: 2.68 MIL/uL — ABNORMAL LOW (ref 4.22–5.81)
RDW: 14.7 % (ref 11.5–15.5)
WBC: 3.9 10*3/uL — ABNORMAL LOW (ref 4.0–10.5)
nRBC: 0 % (ref 0.0–0.2)

## 2023-10-14 LAB — HEPATIC FUNCTION PANEL
ALT: 58 U/L — ABNORMAL HIGH (ref 0–44)
AST: 53 U/L — ABNORMAL HIGH (ref 15–41)
Albumin: 2.1 g/dL — ABNORMAL LOW (ref 3.5–5.0)
Alkaline Phosphatase: 206 U/L — ABNORMAL HIGH (ref 38–126)
Bilirubin, Direct: 0.3 mg/dL — ABNORMAL HIGH (ref 0.0–0.2)
Indirect Bilirubin: 0.4 mg/dL (ref 0.3–0.9)
Total Bilirubin: 0.7 mg/dL (ref 0.0–1.2)
Total Protein: 5.6 g/dL — ABNORMAL LOW (ref 6.5–8.1)

## 2023-10-14 LAB — TYPE AND SCREEN
ABO/RH(D): A POS
Antibody Screen: NEGATIVE

## 2023-10-14 LAB — MAGNESIUM: Magnesium: 1.7 mg/dL (ref 1.7–2.4)

## 2023-10-14 LAB — ACTH STIMULATION, 3 TIME POINTS
Cortisol, 30 Min: 18.2 ug/dL
Cortisol, 60 Min: 18.5 ug/dL
Cortisol, Base: 14.8 ug/dL

## 2023-10-14 LAB — TSH: TSH: 3.121 u[IU]/mL (ref 0.350–4.500)

## 2023-10-14 LAB — FOLATE: Folate: 14.8 ng/mL (ref >5.9)

## 2023-10-14 LAB — BASIC METABOLIC PANEL
Anion gap: 10 (ref 5–15)
BUN: 20 mg/dL (ref 8–23)
CO2: 23 mmol/L (ref 22–32)
Calcium: 8 mg/dL — ABNORMAL LOW (ref 8.9–10.3)
Chloride: 101 mmol/L (ref 98–111)
Creatinine, Ser: 1.06 mg/dL (ref 0.61–1.24)
GFR, Estimated: >60 mL/min (ref >60)
Glucose, Bld: 90 mg/dL (ref 70–99)
Potassium: 3.7 mmol/L (ref 3.5–5.1)
Sodium: 134 mmol/L — ABNORMAL LOW (ref 135–145)

## 2023-10-14 LAB — PROTIME-INR
INR: 1.3 — ABNORMAL HIGH (ref 0.8–1.2)
Prothrombin Time: 16.6 s — ABNORMAL HIGH (ref 11.4–15.2)

## 2023-10-14 LAB — TRANSFERRIN: Transferrin: 108 mg/dL — ABNORMAL LOW (ref 180–329)

## 2023-10-14 LAB — RETICULOCYTES
Immature Retic Fract: 13.6 % (ref 2.3–15.9)
RBC.: 2.78 MIL/uL — ABNORMAL LOW (ref 4.22–5.81)
Retic Count, Absolute: 35.9 10*3/uL (ref 19.0–186.0)
Retic Ct Pct: 1.3 % (ref 0.4–3.1)

## 2023-10-14 LAB — LIPASE, BLOOD: Lipase: 41 U/L (ref 11–51)

## 2023-10-14 LAB — IRON AND TIBC
Iron: 18 ug/dL — ABNORMAL LOW (ref 45–182)
Saturation Ratios: 12 % — ABNORMAL LOW (ref 17.9–39.5)
TIBC: 151 ug/dL — ABNORMAL LOW (ref 250–450)
UIBC: 133 ug/dL

## 2023-10-14 LAB — VITAMIN B12: Vitamin B-12: 1427 pg/mL — ABNORMAL HIGH (ref 180–914)

## 2023-10-14 LAB — MRSA NEXT GEN BY PCR, NASAL: MRSA by PCR Next Gen: NOT DETECTED

## 2023-10-14 LAB — FERRITIN: Ferritin: 325 ng/mL (ref 24–336)

## 2023-10-14 MED ORDER — FERROUS SULFATE 325 (65 FE) MG PO TABS
325.0000 mg | ORAL_TABLET | Freq: Every day | ORAL | Status: DC
  Administered 2023-10-15 – 2023-10-16 (×2): 325 mg via ORAL
  Filled 2023-10-14 (×2): qty 1

## 2023-10-14 MED ORDER — ALLOPURINOL 300 MG PO TABS
150.0000 mg | ORAL_TABLET | Freq: Every day | ORAL | Status: DC
Start: 2023-10-14 — End: 2023-10-16
  Administered 2023-10-14 – 2023-10-16 (×3): 150 mg via ORAL
  Filled 2023-10-14 (×3): qty 1

## 2023-10-14 MED ORDER — VITAMIN C 500 MG PO TABS
500.0000 mg | ORAL_TABLET | Freq: Every day | ORAL | Status: DC
  Administered 2023-10-15 – 2023-10-16 (×2): 500 mg via ORAL
  Filled 2023-10-14 (×2): qty 1

## 2023-10-14 MED ORDER — CYANOCOBALAMIN 1000 MCG/ML IJ SOLN
1000.0000 ug | INTRAMUSCULAR | Status: DC
  Filled 2023-10-14: qty 1

## 2023-10-14 MED ORDER — VANCOMYCIN HCL IN DEXTROSE 1-5 GM/200ML-% IV SOLN: 1000.0000 mg | INTRAVENOUS | Status: DC

## 2023-10-14 MED ORDER — SODIUM CHLORIDE 0.9 % IV SOLN
2.0000 g | INTRAVENOUS | Status: DC
  Administered 2023-10-14: 2 g via INTRAVENOUS
  Filled 2023-10-14: qty 20

## 2023-10-14 MED ORDER — PRAVASTATIN SODIUM 40 MG PO TABS
40.0000 mg | ORAL_TABLET | Freq: Every day | ORAL | Status: DC
  Administered 2023-10-14 – 2023-10-16 (×3): 40 mg via ORAL
  Filled 2023-10-14 (×3): qty 1

## 2023-10-14 MED ORDER — PANTOPRAZOLE SODIUM 40 MG PO TBEC
40.0000 mg | DELAYED_RELEASE_TABLET | Freq: Every day | ORAL | Status: DC
  Administered 2023-10-14 – 2023-10-16 (×3): 40 mg via ORAL
  Filled 2023-10-14 (×3): qty 1

## 2023-10-14 MED ORDER — UMECLIDINIUM BROMIDE 62.5 MCG/ACT IN AEPB
1.0000 | INHALATION_SPRAY | Freq: Every day | RESPIRATORY_TRACT | Status: DC
  Administered 2023-10-14 – 2023-10-16 (×3): 1 via RESPIRATORY_TRACT
  Filled 2023-10-14: qty 7

## 2023-10-14 MED ORDER — ASPIRIN 81 MG PO TBEC
81.0000 mg | DELAYED_RELEASE_TABLET | Freq: Every day | ORAL | Status: DC
Start: 2023-10-14 — End: 2023-10-16
  Administered 2023-10-14 – 2023-10-16 (×3): 81 mg via ORAL
  Filled 2023-10-14 (×3): qty 1

## 2023-10-14 MED ORDER — IOHEXOL 300 MG/ML  SOLN
100.0000 mL | Freq: Once | INTRAMUSCULAR | Status: AC | PRN
  Administered 2023-10-14: 100 mL via INTRAVENOUS

## 2023-10-14 MED ORDER — MOMETASONE FURO-FORMOTEROL FUM 100-5 MCG/ACT IN AERO
2.0000 | INHALATION_SPRAY | Freq: Two times a day (BID) | RESPIRATORY_TRACT | Status: DC
  Administered 2023-10-14 – 2023-10-16 (×5): 2 via RESPIRATORY_TRACT
  Filled 2023-10-14: qty 8.8

## 2023-10-14 MED ORDER — BUDESON-GLYCOPYRROL-FORMOTEROL 160-9-4.8 MCG/ACT IN AERO: 2.0000 | INHALATION_SPRAY | Freq: Two times a day (BID) | RESPIRATORY_TRACT | Status: DC

## 2023-10-14 NOTE — Progress Notes (Signed)
Pharmacy Antibiotic Note  Don [name] is a 78 y.o. male admitted on 10/13/2023 with sepsis.  Pharmacy has been consulted for vancomycin dosing.  Plan: Vancomycin 1500mg  x1 then 1000mg  IV Q24H. Goal AUC 400-550.  Expected AUC 430.  Height: 6' (182.9 cm) Weight: 72.6 kg (160 lb) IBW/kg (Calculated) : 77.6  Temp (24hrs), Avg:99.8 F (37.7 C), Min:99.7 F (37.6 C), Max:99.9 F (37.7 C)  Recent Labs  Lab 10/13/23 2019 10/13/23 2115  WBC 5.2  --   CREATININE 1.32*  --   LATICACIDVEN 1.6 1.0    Estimated Creatinine Clearance: 48.1 mL/min (A) (by C-G formula based on SCr of 1.32 mg/dL (H)).    Allergies  Allergen Reactions   Penicillins Swelling    Thank you for allowing pharmacy to be a part of this patient's care.  Vernard Gambles, PharmD, BCPS  10/14/2023 12:38 AM

## 2023-10-14 NOTE — TOC Initial Note (Signed)
Transition of Care [facility]) - Initial/Assessment Note    Patient Details  Name: Don [name] MRN: [id number] Date of Birth: [birth year]  Transition of Care [facility]) CM/SW Contact:    Villa Herb, LCSWA Phone Number: 10/14/2023, 11:03 AM  Clinical Narrative:                 Patient arrived from [facility]. Per chart review pt recently discharged from hospital to SNF at [facility]. CSW called sister, unable to reach at this time. PT eval is pending. Pt will need a new SNF auth prior to D/C back to SNF facility. TOC to follow.   Expected Discharge Plan: Skilled Nursing Facility Barriers to Discharge: Continued Medical Work up   Patient Goals and CMS Choice Patient states their goals for this hospitalization and ongoing recovery are:: go back to SNF CMS Medicare.gov Compare Post Acute Care list provided to:: Patient Represenative (must comment) Choice offered to / list presented to : Sibling      Expected Discharge Plan and Services In-house Referral: Clinical Social Work Discharge Planning Services: CM Consult   Living arrangements for the past 2 months: Skilled Nursing Facility                                      Prior Living Arrangements/Services Living arrangements for the past 2 months: Skilled Nursing Facility Lives with:: Siblings Patient language and need for interpreter reviewed:: Yes Do you feel safe going back to the place where you live?: Yes      Need for Family Participation in Patient Care: Yes (Comment) Care giver support system in place?: Yes (comment)   Criminal Activity/Legal Involvement Pertinent to Current Situation/Hospitalization: No - Comment as needed  Activities of Daily Living      Permission Sought/Granted                  Emotional Assessment Appearance:: Appears stated age       Alcohol / Substance Use: Not Applicable Psych Involvement: No (comment)  Admission diagnosis:  Hypotension [I95.9] Sepsis (HCC)  [A41.9] Hypotension, unspecified hypotension type [I95.9] Patient Active Problem List   Diagnosis Date Noted   Sepsis (HCC) 10/14/2023   Hypotension 10/13/2023   Syncope 09/29/2023   NICM (nonischemic cardiomyopathy) (HCC) 09/29/2023   CKD (chronic kidney disease) stage 3, GFR 30-59 ml/min (HCC) 09/29/2023   Right hand pain 08/04/2023   Atypical chest pain 08/04/2023   AKI (acute kidney injury) (HCC) 08/03/2023   Leukocytosis 08/03/2023   Falls 08/03/2023   Hypertension    CHF (congestive heart failure) (HCC) 04/19/2019   Pleural effusion    High cholesterol 03/19/2015   GERD (gastroesophageal reflux disease) 03/19/2015   Gout 03/19/2015   Hepatitis C 03/19/2015   PCP:  [facility]:   [facility] - [place], [place] - [address] [place] [address] Phone: [phone] Fax: [phone]     Social Drivers of Health (SDOH) Social History: SDOH Screenings   Food Insecurity: No Food Insecurity (10/14/2023)  Housing: Low Risk  (10/14/2023)  Transportation Needs: No Transportation Needs (10/14/2023)  Utilities: Not At Risk (10/14/2023)  Social Connections: Unknown (10/14/2023)  Tobacco Use: Medium Risk (09/28/2023)   SDOH Interventions:     Readmission Risk Interventions    10/14/2023   11:02 AM  Readmission Risk Prevention Plan  Transportation Screening Complete  HRI or Home  Care Consult Complete  Social Work Consult for Recovery Care Planning/Counseling Complete  Palliative Care Screening Not Applicable  Medication Review Oceanographer) Complete

## 2023-10-14 NOTE — Progress Notes (Signed)
Pt arrived to room 330 via stretcher from ED.  Telemetry initiated.  VS obtained.  Lab at bedside for AM labwork. Call bell in reach.   10/14/23 0614  Vitals  Temp (!) 97.3 F (36.3 C)  Temp Source Oral  BP (!) 97/56  MAP (mmHg) 70  BP Location Left Arm  BP Method Automatic  Patient Position (if appropriate) Lying  Pulse Rate 60  Pulse Rate Source Monitor  Resp 16  Level of Consciousness  Level of Consciousness Alert  MEWS COLOR  MEWS Score Color Green  Oxygen Therapy  SpO2 99 %  O2 Device Room Air  Pain Assessment  Pain Scale 0-10  Pain Score 10  Pain Type Chronic pain  Pain Location Other (Comment) (feet and hands)  Pain Orientation Right;Left  Pain Onset On-going  Pain Intervention(s) Repositioned;Emotional support  POSS Scale (Pasero Opioid Sedation Scale)  POSS *See Group Information* 1-Acceptable,Awake and alert  PCA/Epidural/Spinal Assessment  Respiratory Pattern Regular;Unlabored;Symmetrical  Height and Weight  Height 6' (1.829 m)  Weight 74.8 kg  Type of Scale Used Bed  Type of Weight Actual  BSA (Calculated - sq m) 1.95 sq meters  BMI (Calculated) 22.36  Weight in (lb) to have BMI = 25 183.9  MEWS Score  MEWS Temp 0  MEWS Systolic 1  MEWS Pulse 0  MEWS RR 0  MEWS LOC 0  MEWS Score 1    Hilton Sinclair BSN RN [employer] 10/14/2023, 6:23 AM

## 2023-10-14 NOTE — Plan of Care (Signed)
  Problem: Acute Rehab PT Goals(only PT should resolve) Goal: Pt Will Go Supine/Side To Sit Outcome: Progressing Flowsheets (Taken 10/14/2023 1255) Pt will go Supine/Side to Sit:  with supervision  with contact guard assist Goal: Patient Will Transfer Sit To/From Stand Outcome: Progressing Flowsheets (Taken 10/14/2023 1255) Patient will transfer sit to/from stand: with moderate assist Goal: Pt Will Transfer Bed To Chair/Chair To Bed Outcome: Progressing Flowsheets (Taken 10/14/2023 1255) Pt will Transfer Bed to Chair/Chair to Bed: with mod assist Goal: Pt Will Ambulate Outcome: Progressing Flowsheets (Taken 10/14/2023 1255) Pt will Ambulate:  15 feet  with moderate assist  with rolling walker   12:55 PM, 10/14/23 Ocie Bob, MPT Physical Therapist with [facility] [phone] office [phone] mobile phone

## 2023-10-14 NOTE — Progress Notes (Signed)
ASSUMPTION OF CARE NOTE   10/14/2023 1:36 PM  Don [name] was seen and examined.  The H&P by the admitting provider, orders, imaging was reviewed.  Please see new orders.  Will continue to follow.   Vitals:   10/14/23 0614 10/14/23 1058  BP: (!) 97/56   Pulse: 60   Resp: 16   Temp: (!) 97.3 F (36.3 C)   SpO2: 99% 97%    Results for orders placed or performed during the hospital encounter of 10/13/23  Resp panel by RT-PCR (RSV, Flu A&B, Covid) Anterior Nasal Swab   Collection Time: 10/13/23  8:10 PM   Specimen: Anterior Nasal Swab  Result Value Ref Range   SARS Coronavirus 2 by RT PCR NEGATIVE NEGATIVE   Influenza A by PCR NEGATIVE NEGATIVE   Influenza B by PCR NEGATIVE NEGATIVE   Resp Syncytial Virus by PCR NEGATIVE NEGATIVE  Blood Culture (routine x 2)   Collection Time: 10/13/23  8:19 PM   Specimen: BLOOD  Result Value Ref Range   Specimen Description BLOOD BLOOD RIGHT ARM forearm    Special Requests NONE    Culture      NO GROWTH < 12 HOURS Performed at [facility], [address], [place], [place] [address]    Report Status PENDING   Lactic acid, plasma   Collection Time: 10/13/23  8:19 PM  Result Value Ref Range   Lactic Acid, Venous 1.6 0.5 - 1.9 mmol/L  Comprehensive metabolic panel   Collection Time: 10/13/23  8:19 PM  Result Value Ref Range   Sodium 131 (L) 135 - 145 mmol/L   Potassium 3.9 3.5 - 5.1 mmol/L   Chloride 100 98 - 111 mmol/L   CO2 26 22 - 32 mmol/L   Glucose, Bld 104 (H) 70 - 99 mg/dL   BUN 18 8 - 23 mg/dL   Creatinine, Ser 6.29 (H) 0.61 - 1.24 mg/dL   Calcium 8.2 (L) 8.9 - 10.3 mg/dL   Total Protein 5.6 (L) 6.5 - 8.1 g/dL   Albumin 1.7 (L) 3.5 - 5.0 g/dL   AST 78 (H) 15 - 41 U/L   ALT 71 (H) 0 - 44 U/L   Alkaline Phosphatase 245 (H) 38 - 126 U/L   Total Bilirubin 1.0 0.0 - 1.2 mg/dL   GFR, Estimated 56 (L) >60 mL/min   Anion gap 5 5 - 15  CBC with Differential   Collection Time: 10/13/23  8:19 PM  Result Value Ref Range   WBC  5.2 4.0 - 10.5 K/uL   RBC 2.64 (L) 4.22 - 5.81 MIL/uL   Hemoglobin 8.1 (L) 13.0 - 17.0 g/dL   HCT 52.8 (L) 41.3 - 24.4 %   MCV 93.2 80.0 - 100.0 fL   MCH 30.7 26.0 - 34.0 pg   MCHC 32.9 30.0 - 36.0 g/dL   RDW 01.0 27.2 - 53.6 %   Platelets 209 150 - 400 K/uL   nRBC 0.0 0.0 - 0.2 %   Neutrophils Relative % 82 %   Neutro Abs 4.3 1.7 - 7.7 K/uL   Lymphocytes Relative 10 %   Lymphs Abs 0.5 (L) 0.7 - 4.0 K/uL   Monocytes Relative 7 %   Monocytes Absolute 0.4 0.1 - 1.0 K/uL   Eosinophils Relative 0 %   Eosinophils Absolute 0.0 0.0 - 0.5 K/uL   Basophils Relative 0 %   Basophils Absolute 0.0 0.0 - 0.1 K/uL   Immature Granulocytes 1 %   Abs Immature Granulocytes 0.04 0.00 -  0.07 K/uL  Protime-INR   Collection Time: 10/13/23  8:19 PM  Result Value Ref Range   Prothrombin Time 16.8 (H) 11.4 - 15.2 seconds   INR 1.3 (H) 0.8 - 1.2  APTT   Collection Time: 10/13/23  8:19 PM  Result Value Ref Range   aPTT 36 24 - 36 seconds  Urinalysis, w/ Reflex to Culture (Infection Suspected) -Urine, Catheterized   Collection Time: 10/13/23  8:19 PM  Result Value Ref Range   Specimen Source URINE, CATHETERIZED    Color, Urine AMBER (A) YELLOW   APPearance CLEAR CLEAR   Specific Gravity, Urine 1.016 1.005 - 1.030   pH 5.0 5.0 - 8.0   Glucose, UA NEGATIVE NEGATIVE mg/dL   Hgb urine dipstick MODERATE (A) NEGATIVE   Bilirubin Urine NEGATIVE NEGATIVE   Ketones, ur NEGATIVE NEGATIVE mg/dL   Protein, ur NEGATIVE NEGATIVE mg/dL   Nitrite NEGATIVE NEGATIVE   Leukocytes,Ua NEGATIVE NEGATIVE   RBC / HPF 6-10 0 - 5 RBC/hpf   WBC, UA 0-5 0 - 5 WBC/hpf   Bacteria, UA NONE SEEN NONE SEEN   Squamous Epithelial / HPF 0-5 0 - 5 /HPF  Blood Culture (routine x 2)   Collection Time: 10/13/23  8:40 PM   Specimen: BLOOD  Result Value Ref Range   Specimen Description BLOOD BLOOD RIGHT ARM Antecubital    Special Requests NONE    Culture      NO GROWTH < 12 HOURS Performed at [facility], [address],  [place], [place] [address]    Report Status PENDING   Lactic acid, plasma   Collection Time: 10/13/23  9:15 PM  Result Value Ref Range   Lactic Acid, Venous 1.0 0.5 - 1.9 mmol/L  Ammonia   Collection Time: 10/13/23  9:15 PM  Result Value Ref Range   Ammonia 26 9 - 35 umol/L  Respiratory (~20 pathogens) panel by PCR   Collection Time: 10/14/23  2:33 AM   Specimen: Nasopharyngeal Swab; Respiratory  Result Value Ref Range   Adenovirus NOT DETECTED NOT DETECTED   Coronavirus 229E NOT DETECTED NOT DETECTED   Coronavirus HKU1 NOT DETECTED NOT DETECTED   Coronavirus NL63 NOT DETECTED NOT DETECTED   Coronavirus OC43 NOT DETECTED NOT DETECTED   Metapneumovirus NOT DETECTED NOT DETECTED   Rhinovirus / Enterovirus NOT DETECTED NOT DETECTED   Influenza A NOT DETECTED NOT DETECTED   Influenza B NOT DETECTED NOT DETECTED   Parainfluenza Virus 1 NOT DETECTED NOT DETECTED   Parainfluenza Virus 2 NOT DETECTED NOT DETECTED   Parainfluenza Virus 3 NOT DETECTED NOT DETECTED   Parainfluenza Virus 4 NOT DETECTED NOT DETECTED   Respiratory Syncytial Virus NOT DETECTED NOT DETECTED   Bordetella pertussis NOT DETECTED NOT DETECTED   Bordetella Parapertussis NOT DETECTED NOT DETECTED   Chlamydophila pneumoniae NOT DETECTED NOT DETECTED   Mycoplasma pneumoniae NOT DETECTED NOT DETECTED  ACTH stimulation, 3 time points (baseline, 30 min, 60 min)   Collection Time: 10/14/23  6:36 AM  Result Value Ref Range   Cortisol, Base 14.8 ug/dL   Cortisol, 30 Min 60.4 ug/dL   Cortisol, 60 Min 54.0 ug/dL  Basic metabolic panel   Collection Time: 10/14/23  6:36 AM  Result Value Ref Range   Sodium 134 (L) 135 - 145 mmol/L   Potassium 3.7 3.5 - 5.1 mmol/L   Chloride 101 98 - 111 mmol/L   CO2 23 22 - 32 mmol/L   Glucose, Bld 90 70 - 99 mg/dL   BUN  20 8 - 23 mg/dL   Creatinine, Ser 4.09 0.61 - 1.24 mg/dL   Calcium 8.0 (L) 8.9 - 10.3 mg/dL   GFR, Estimated >81 >19 mL/min   Anion gap 10 5 - 15  CBC   Collection Time:  10/14/23  6:36 AM  Result Value Ref Range   WBC 3.9 (L) 4.0 - 10.5 K/uL   RBC 2.68 (L) 4.22 - 5.81 MIL/uL   Hemoglobin 8.3 (L) 13.0 - 17.0 g/dL   HCT 14.7 (L) 82.9 - 56.2 %   MCV 95.5 80.0 - 100.0 fL   MCH 31.0 26.0 - 34.0 pg   MCHC 32.4 30.0 - 36.0 g/dL   RDW 13.0 86.5 - 78.4 %   Platelets 203 150 - 400 K/uL   nRBC 0.0 0.0 - 0.2 %  Magnesium   Collection Time: 10/14/23  6:36 AM  Result Value Ref Range   Magnesium 1.7 1.7 - 2.4 mg/dL  Phosphorus   Collection Time: 10/14/23  6:36 AM  Result Value Ref Range   Phosphorus 3.7 2.5 - 4.6 mg/dL  TSH   Collection Time: 10/14/23  6:36 AM  Result Value Ref Range   TSH 3.121 0.350 - 4.500 uIU/mL  Ferritin   Collection Time: 10/14/23  6:36 AM  Result Value Ref Range   Ferritin 325 24 - 336 ng/mL  Iron and TIBC   Collection Time: 10/14/23  6:36 AM  Result Value Ref Range   Iron 18 (L) 45 - 182 ug/dL   TIBC 696 (L) 295 - 284 ug/dL   Saturation Ratios 12 (L) 17.9 - 39.5 %   UIBC 133 ug/dL  Transferrin   Collection Time: 10/14/23  6:36 AM  Result Value Ref Range   Transferrin 108 (L) 180 - 329 mg/dL  Reticulocytes   Collection Time: 10/14/23  6:36 AM  Result Value Ref Range   Retic Ct Pct 1.3 0.4 - 3.1 %   RBC. 2.78 (L) 4.22 - 5.81 MIL/uL   Retic Count, Absolute 35.9 19.0 - 186.0 K/uL   Immature Retic Fract 13.6 2.3 - 15.9 %  Hepatic function panel   Collection Time: 10/14/23  6:36 AM  Result Value Ref Range   Total Protein 5.6 (L) 6.5 - 8.1 g/dL   Albumin 2.1 (L) 3.5 - 5.0 g/dL   AST 53 (H) 15 - 41 U/L   ALT 58 (H) 0 - 44 U/L   Alkaline Phosphatase 206 (H) 38 - 126 U/L   Total Bilirubin 0.7 0.0 - 1.2 mg/dL   Bilirubin, Direct 0.3 (H) 0.0 - 0.2 mg/dL   Indirect Bilirubin 0.4 0.3 - 0.9 mg/dL  Folate   Collection Time: 10/14/23  6:36 AM  Result Value Ref Range   Folate 14.8 >5.9 ng/mL  Type and screen   Collection Time: 10/14/23  6:36 AM  Result Value Ref Range   ABO/RH(D) A POS    Antibody Screen NEG    Sample  Expiration      10/17/2023,2359 Performed at [facility], [address] [place], [place] [address]   Lipase, blood   Collection Time: 10/14/23  7:00 AM  Result Value Ref Range   Lipase 41 11 - 51 U/L  Protime-INR   Collection Time: 10/14/23  7:06 AM  Result Value Ref Range   Prothrombin Time 16.6 (H) 11.4 - 15.2 seconds   INR 1.3 (H) 0.8 - 1.2     C. Laural Benes, MD [facility]   10/13/2023  7:43 PM How to contact the [facility]  Attending or Consulting provider 7A - 7P or covering provider during after hours 7P -7A, for this patient?  Check the care team in [facility] and look for a) attending/consulting [facility] provider listed and b) the [facility] team listed Log into [email or web address] and use 's universal password to access. If you do not have the password, please contact the hospital operator. Locate the [facility] provider you are looking for under [facility] and page to a number that you can be directly reached. If you still have difficulty reaching the provider, please page the [facility] (Director on Call) for the Hospitalists listed on amion for assistance.

## 2023-10-14 NOTE — Evaluation (Signed)
Physical Therapy Evaluation Patient Details Name: Don [name] MRN: [id number] DOB: December 13, 1945 Today's Date: 10/14/2023  History of Present Illness  Don [name] is a 78 y.o. male, currently at [facility], with hx of debility mainly wheelchair dependent, heart failure with improved EF, hypertension, hyperlipidemia, hep C, CKD 3, who was brought in from SNF due to febrile illness.  Reportedly febrile to 104 F and treated with Tylenol there.  On interview patient's only complaint is that he feels that he is had swelling around his ankles over the past few weeks.  He is not aware of any fevers, no chills.  No chest pain, dyspnea, cough/cold symptoms, abdominal pain, nausea, vomiting, diarrhea, dysuria, rashes.  He denies any bleeding sites.  Acknowledges chronic issues with p.o. intake and states he has no appetite.   Clinical Impression  Patient demonstrates fair/good return for sitting up at bedside with [facility] fully raised and using bed rails, required repeated attempts before able to stand due to BLE weakness and limited to a few shuffling side steps before having to sit due to weakness.  Patient tolerated sitting up in chair after therapy - nursing staff notified. Patient will benefit from continued skilled physical therapy in hospital and recommended venue below to increase strength, balance, endurance for safe ADLs and gait.          If plan is discharge home, recommend the following: A lot of help with bathing/dressing/bathroom;A lot of help with walking and/or transfers;Help with stairs or ramp for entrance;Assistance with cooking/housework   Can travel by private vehicle        Equipment Recommendations None recommended by PT  Recommendations for Other Services       Functional Status Assessment Patient has had a recent decline in their functional status and demonstrates the ability to make significant improvements in function in a reasonable and predictable amount of time.      Precautions / Restrictions Precautions Precautions: Fall Restrictions Weight Bearing Restrictions Per Provider Order: No      Mobility  Bed Mobility Overal bed mobility: Needs Assistance Bed Mobility: Supine to Sit     Supine to sit: Contact guard, Min assist, HOB elevated     General bed mobility comments: required HOB fully elevated    Transfers Overall transfer level: Needs assistance Equipment used: Rolling walker (2 wheels) Transfers: Sit to/from Stand, Bed to chair/wheelchair/BSC Sit to Stand: Mod assist, Max assist     Squat pivot transfers: Mod assist, Max assist     General transfer comment: unsteady labored movement with flexed trunk    Ambulation/Gait Ambulation/Gait assistance: Max assist Gait Distance (Feet): 3 Feet Assistive device: Rolling walker (2 wheels) Gait Pattern/deviations: Decreased step length - left, Decreased stance time - right, Decreased stride length, Trunk flexed, Shuffle Gait velocity: slow     General Gait Details: limited to a few slow labored shuffling side steps before having to sit due to fatigue, BLE weakness  Stairs            Wheelchair Mobility     Tilt Bed    Modified Rankin (Stroke Patients Only)       Balance Overall balance assessment: Needs assistance Sitting-balance support: Feet supported, No upper extremity supported Sitting balance-Leahy Scale: Fair Sitting balance - Comments: fair/good seated at EOB   Standing balance support: Reliant on assistive device for balance, During functional activity, Bilateral upper extremity supported Standing balance-Leahy Scale: Poor Standing balance comment: using RW  Pertinent Vitals/Pain Pain Assessment Pain Assessment: No/denies pain    Home Living Family/patient expects to be discharged to:: Private residence Living Arrangements: Non-relatives/Friends Available Help at Discharge: Family;Available 24  hours/day Type of Home: House Home Access: Stairs to enter Entrance Stairs-Rails: Right;Left Entrance Stairs-Number of Steps: 6 Alternate Level Stairs-Number of Steps: 10 Home Layout: Two level;Able to live on main level with bedroom/bathroom Home Equipment: BSC/3in1;Rolling Walker (2 wheels);Crutches;Cane - single point      Prior Function Prior Level of Function : Needs assist       Physical Assist : Mobility (physical);ADLs (physical)     Mobility Comments: Household ambulation using RW, since at SNF, 2 person assist for transfers ADLs Comments: Assisted by family     Extremity/Trunk Assessment   Upper Extremity Assessment Upper Extremity Assessment: Defer to OT evaluation    Lower Extremity Assessment Lower Extremity Assessment: Generalized weakness       Communication      Cognition Arousal: Alert Behavior During Therapy: WFL for tasks assessed/performed Overall Cognitive Status: Within Functional Limits for tasks assessed                                          General Comments      Exercises     Assessment/Plan    PT Assessment Patient needs continued PT services  PT Problem List Decreased strength;Decreased activity tolerance;Decreased balance;Decreased mobility       PT Treatment Interventions DME instruction;Gait training;Stair training;Functional mobility training;Therapeutic activities;Therapeutic exercise;Balance training;Patient/family education    PT Goals (Current goals can be found in the Care Plan section)  Acute Rehab PT Goals Patient Stated Goal: return home after rehab PT Goal Formulation: With patient Time For Goal Achievement: 10/28/23 Potential to Achieve Goals: Good    Frequency Min 3X/week     Co-evaluation               AM-PAC PT "6 Clicks" Mobility  Outcome Measure Help needed turning from your back to your side while in a flat bed without using bedrails?: A Little Help needed moving from lying  on your back to sitting on the side of a flat bed without using bedrails?: A Little Help needed moving to and from a bed to a chair (including a wheelchair)?: A Lot Help needed standing up from a chair using your arms (e.g., wheelchair or bedside chair)?: A Lot Help needed to walk in hospital room?: A Lot Help needed climbing 3-5 steps with a railing? : Total 6 Click Score: 13    End of Session   Activity Tolerance: Patient tolerated treatment well;Patient limited by fatigue Patient left: in chair;with call bell/phone within reach Nurse Communication: Mobility status PT Visit Diagnosis: Unsteadiness on feet (R26.81);Other abnormalities of gait and mobility (R26.89);Muscle weakness (generalized) (M62.81)    Time: 9629-5284 PT Time Calculation (min) (ACUTE ONLY): 26 min   Charges:   PT Evaluation $PT Eval Moderate Complexity: 1 Mod PT Treatments $Therapeutic Activity: 23-37 mins PT General Charges $$ ACUTE PT VISIT: 1 Visit         12:53 PM, 10/14/23 Ocie Bob, MPT Physical Therapist with [facility] 336 [phone] office [phone] mobile phone

## 2023-10-14 NOTE — ED Notes (Signed)
[facility] updated on pt status

## 2023-10-14 NOTE — H&P (Addendum)
History and Physical    Don [name] GMW:[id number] DOB: 1946/05/02 DOA: 10/13/2023  PCP: [facility]   Patient coming from: SNF [facility]    Chief Complaint:  Chief Complaint  Patient presents with   Possible Sepsis    HPI:  Don [name] is a 78 y.o. male, currently at [facility], with hx of debility mainly wheelchair dependent, heart failure with improved EF, hypertension, hyperlipidemia, hep C, CKD 3, who was brought in from SNF due to febrile illness.  Reportedly febrile to 104 F and treated with Tylenol there.  On interview patient's only complaint is that he feels that he is had swelling around his ankles over the past few weeks.  He is not aware of any fevers, no chills.  No chest pain, dyspnea, cough/cold symptoms, abdominal pain, nausea, vomiting, diarrhea, dysuria, rashes.  He denies any bleeding sites.  Acknowledges chronic issues with p.o. intake and states he has no appetite.   Review of Systems:  ROS complete and negative except as marked above   Allergies  Allergen Reactions   Penicillins Swelling    Prior to Admission medications   Medication Sig Start Date End Date Taking? Authorizing Provider  albuterol (VENTOLIN HFA) 108 (90 Base) MCG/ACT inhaler Inhale 1-2 puffs into the lungs every 4 (four) hours as needed for wheezing or shortness of breath. Patient not taking: Reported on 10/01/2023 08/20/23   [provider]  allopurinol (ZYLOPRIM) 100 MG tablet Take 1.5 tablets (150 mg total) by mouth daily. 08/04/23 08/03/24  Vassie Loll, MD  aspirin EC 81 MG tablet Take 81 mg by mouth daily.    [provider]  BREZTRI AEROSPHERE 160-9-4.8 MCG/ACT AERO Inhale 2 puffs into the lungs 2 (two) times daily. Patient not taking: Reported on 10/01/2023 08/20/23   [provider]  calcitRIOL (ROCALTROL) 0.25 MCG capsule Take 0.25 mcg by mouth daily.    [provider]  carvedilol (COREG) 3.125 MG tablet Take 2  tablets (6.25 mg total) by mouth 2 (two) times daily with a meal. 10/04/23   Ghimire, Werner Lean, MD  cholecalciferol (VITAMIN D3) 25 MCG (1000 UNIT) tablet Take 1,000 Units by mouth daily.    [provider]  colchicine 0.6 MG tablet TAKE TWO TABLETS BY MOUTH AT FIRST SIGN OF FLARE FOLLOWED BY ONE TABLET ONE HOUR LATER (MAX THREE IN 24 HOURS) 08/13/23   [provider]  cyanocobalamin (VITAMIN B12) 1000 MCG/ML injection Take daily x 3 days, then take once weekly x 4 weeks, then once a month. 10/05/23   Ghimire, Werner Lean, MD  ondansetron (ZOFRAN) 4 MG tablet Take 1 tablet (4 mg total) by mouth every 6 (six) hours. 07/24/23   Carmel Sacramento A, PA-C  pantoprazole (PROTONIX) 40 MG tablet Take 1 tablet (40 mg total) by mouth daily. 08/05/23   Vassie Loll, MD  pravastatin (PRAVACHOL) 40 MG tablet Take 40 mg by mouth daily.    [provider]    Past Medical History:  Diagnosis Date   Aortic atherosclerosis (HCC)    Cardiomyopathy (HCC)    a. EF 20 to 25% by echo in 03/2019.   Cerebrovascular disease    Chronic HFrEF (heart failure with reduced ejection fraction) (HCC)    Chronic kidney disease, stage 3a (HCC)    Cirrhosis (HCC)    Essential hypertension    Gout    Hepatitis C    Hyperlipidemia     Past Surgical History:  Procedure Laterality Date  HERNIA REPAIR Left      reports that he quit smoking about 15 years ago. His smoking use included cigarettes. He has never used smokeless tobacco. He reports that he does not currently use drugs. He reports that he does not drink alcohol.  Family History  Problem Relation Age of Onset   Hypertension Sister      Physical Exam: Vitals:   10/14/23 0215 10/14/23 0230 10/14/23 0300 10/14/23 0330  BP: (!) 104/50 95/68 96/62  (!) 95/53  Pulse: 77 73 (!) 52 76  Resp: 13 13 13 13   Temp:      TempSrc:      SpO2: 100% 100% 97% 99%  Weight:      Height:        Gen: Awake, alert, chronically ill-appearing,  elderly, frail CV: Regular, normal S1, S2, no murmurs. Diminished peripheral pulses  Resp: Normal WOB, CTAB  Abd: Flat, normoactive, minimal RUQ tenderness  MSK: Symmetric, trace edema at ankles  Skin: No rashes or lesions to exposed skin  Neuro: Alert and interactive, fully oriented  Psych: affect is low / depressed. Denies depressed mood    Data review:   Labs reviewed, notable for:   T. bili 1.7, AST 78, ALT 71, alk phos 245 uptrending WBC within normal limits Hemoglobin 8, downtrending from 10  Micro:  Results for orders placed or performed during the hospital encounter of 10/13/23  Resp panel by RT-PCR (RSV, Flu A&B, Covid) Anterior Nasal Swab     Status: None   Collection Time: 10/13/23  8:10 PM   Specimen: Anterior Nasal Swab  Result Value Ref Range Status   SARS Coronavirus 2 by RT PCR NEGATIVE NEGATIVE Final    Comment: (NOTE) SARS-CoV-2 target nucleic acids are NOT DETECTED.  The SARS-CoV-2 RNA is generally detectable in upper respiratory specimens during the acute phase of infection. The lowest concentration of SARS-CoV-2 viral copies this assay can detect is 138 copies/mL. A negative result does not preclude SARS-Cov-2 infection and should not be used as the sole basis for treatment or other patient management decisions. A negative result may occur with  improper specimen collection/handling, submission of specimen other than nasopharyngeal swab, presence of viral mutation(s) within the areas targeted by this assay, and inadequate number of viral copies(<138 copies/mL). A negative result must be combined with clinical observations, patient history, and epidemiological information. The expected result is Negative.  Fact Sheet for Patients:  BloggerCourse.com  Fact Sheet for Healthcare Providers:  SeriousBroker.it  This test is no t yet approved or cleared by the [place] FDA and  has been authorized for  detection and/or diagnosis of SARS-CoV-2 by FDA under an Emergency Use Authorization (EUA). This EUA will remain  in effect (meaning this test can be used) for the duration of the COVID-19 declaration under Section 564(b)(1) of the Act, 21 U.S.C.section 360bbb-3(b)(1), unless the authorization is terminated  or revoked sooner.       Influenza A by PCR NEGATIVE NEGATIVE Final   Influenza B by PCR NEGATIVE NEGATIVE Final    Comment: (NOTE) The Xpert Xpress SARS-CoV-2/FLU/RSV plus assay is intended as an aid in the diagnosis of influenza from Nasopharyngeal swab specimens and should not be used as a sole basis for treatment. Nasal washings and aspirates are unacceptable for Xpert Xpress SARS-CoV-2/FLU/RSV testing.  Fact Sheet for Patients: BloggerCourse.com  Fact Sheet for Healthcare Providers: SeriousBroker.it  This test is not yet approved or cleared by the [place] and has been authorized for  detection and/or diagnosis of SARS-CoV-2 by FDA under an Emergency Use Authorization (EUA). This EUA will remain in effect (meaning this test can be used) for the duration of the COVID-19 declaration under Section 564(b)(1) of the Act, 21 U.S.C. section 360bbb-3(b)(1), unless the authorization is terminated or revoked.     Resp Syncytial Virus by PCR NEGATIVE NEGATIVE Final    Comment: (NOTE) Fact Sheet for Patients: BloggerCourse.com  Fact Sheet for Healthcare Providers: SeriousBroker.it  This test is not yet approved or cleared by the [place] FDA and has been authorized for detection and/or diagnosis of SARS-CoV-2 by FDA under an Emergency Use Authorization (EUA). This EUA will remain in effect (meaning this test can be used) for the duration of the COVID-19 declaration under Section 564(b)(1) of the Act, 21 U.S.C. section 360bbb-3(b)(1), unless the authorization is  terminated or revoked.  Performed at [facility], [address], [place], [place] [address]     Imaging reviewed:  [facility] Chest [facility] 1 View Result Date: 10/13/2023 CLINICAL DATA:  Sepsis, febrile EXAM: PORTABLE CHEST 1 VIEW COMPARISON:  09/29/2023 FINDINGS: Single frontal view of the chest demonstrates a stable cardiac silhouette. Chronic elevation of the right hemidiaphragm. Left hemidiaphragm is excluded by collimation. No airspace disease, effusion, or pneumothorax. No acute bony abnormalities. IMPRESSION: 1. Stable chest, no acute process. Electronically Signed   By: Sharlet Salina M.D.   On: 10/13/2023 20:30    EKG:  Personally reviewed sinus rhythm with frequent PACs, slow PVC couplets, LAE, poor R wave progression, no acute ischemic changes.  QTc 532    ED Course:  Treated with 2.5 L IV fluid, ceftriaxone   Assessment/Plan:  78 y.o. male with hx SNF [facility], with hx of debility mainly wheelchair dependent, heart failure with improved EF, CVA, hypertension, hyperlipidemia, hep C, CKD 3, who was brought in from SNF due to febrile illness.  Borderline sepsis with unknown source  Borderline sepsis, present on admission, improving Borderline hypotension, present on admission, ongoing Per [facility] had fever to 104 F, treated with Tylenol.  Here in the ED temp 99.9 initially tachypneic.  No other SIRS criteria.  Lactate 1.6 -> 1. no localizing symptoms by history. Does have decreased intake and likely significant hypovolemia also contributing.  Labs with ALI with mixed pattern.  Worsening anemia without history of bleeding, hb 10.8 -> 8.1. UA not c/w infection, microscopic hematuria. Flu / COVID / RSV neg. CXR with no infiltrate. Suspect possibly intraabd infection / biliary source with his ALI although remains unclear  - Status post ceftriaxone in the ED.  Add vancomycin, Flagyl - Follow-up blood cultures - Status post 2.5 L IV fluid, continue maintenance IV fluid with NS 100  cc/h - Given his significant hypoalbuminemia 1.7 give albumin 25 g every 6 hours x 4 doses - Start midodrine 5 mg 3 times daily - Trend blood counts, type and screen, evaluation of anemia per below - Evaluation for other causes of hypotension with cosyntropin stim test in the a.m. (last cosyntropin stim with peak of 15.2 which is borderline).  Check ACTH and DHEA-S  - Obtain CT abdomen pelvis with IV contrast to eval for intra-abdominal infection  Acute liver injury, mixed pattern T. bili 1.7, AST 78, ALT 71, alk phos 245 uptrending.  - CT abdomen pelvis per above - Trend LFTs  Acute on chronic anemia History B12 deficiency Recent hemoglobin 10.8 on 1/3.  On admission 8.1, normocytic.  Recently diagnosed B12 deficiency on IM B12 weekly.  -  Check iron panel, folate, reticulocytes - Continue B12 IM weekly - Monitor for bleeding, type and screen, send FOBT  Bradycardic, frequent ectopy Prolonged QTc Telemetry with frequent PAC, PVC couplets, NSVT.  EKG with QTc 532 - Continue telemetry monitoring - Hold home Coreg in the setting of hypotension and bradycardia - Avoid QT prolonging medications  Malnutrition, moderate protein calorie Reports no appetite, appears to have depressed affect although denies depressed mood. - Nutrition evaluation - Multivitamin, thiamine, folate - Add ensures twice daily - Continue appetite stimulant like mirtazapine, would hold for now considering his QT prolongation  Chronic debility - PT, OT   Chronic medical problems: Heart failure with improved EF: Recent TTE 1/4 with improved LVEF 55 to 60%, grade 1 diastolic dysfunction, no significant valvular disease.  Previously on Entresto, Jardiance, spironolactone, Lasix which were stopped last admission due to low blood pressure.  Holding Coreg this admission due to bradycardia and hypotension. CVA: Continue home aspirin, pravastatin Hypertension: No longer on antihypertensives, see above Hyperlipidemia:  Continue home statin HCV: remote check with undetectable quant  Previous question of cirrhosis: noted to have nodular liver contour, no stigmata of cirrhosis.  CKD 3: Baseline creatinine approximately 1.2 Gout: Continue home allopurinol  Body mass index is 21.7 kg/m.    DVT prophylaxis:  SCDs Code Status:  DNR/DNI(Do NOT Intubate); Confirmed with patient  Diet:  Diet Orders (From admission, onward)     Start     Ordered   10/13/23 2333  Diet regular Room service appropriate? Yes; Fluid consistency: Thin  Diet effective now       Question Answer Comment  Room service appropriate? Yes   Fluid consistency: Thin      10/13/23 2334           Family Communication:  No   Consults:  None   Admission status:   Inpatient, Telemetry bed;   Severity of Illness: The appropriate patient status for this patient is INPATIENT. Inpatient status is judged to be reasonable and necessary in order to provide the required intensity of service to ensure the patient's safety. The patient's presenting symptoms, physical exam findings, and initial radiographic and laboratory data in the context of their chronic comorbidities is felt to place them at high risk for further clinical deterioration. Furthermore, it is not anticipated that the patient will be medically stable for discharge from the hospital within 2 midnights of admission.   * I certify that at the point of admission it is my clinical judgment that the patient will require inpatient hospital care spanning beyond 2 midnights from the point of admission due to high intensity of service, high risk for further deterioration and high frequency of surveillance required.*   Dolly Rias, MD [facility]  How to contact the [facility] Attending or Consulting provider 7A - 7P or covering provider during after hours 7P -7A, for this patient.  Check the care team in [facility] and look for a) attending/consulting TRH provider listed and b) the [facility] team  listed Log into [email or web address] and use [place]'s universal password to access. If you do not have the password, please contact the hospital operator. Locate the [facility] provider you are looking for under [facility] and page to a number that you can be directly reached. If you still have difficulty reaching the provider, please page the [facility] (Director on Call) for the Hospitalists listed on amion for assistance.  10/14/2023, 4:24 AM

## 2023-10-14 NOTE — Hospital Course (Signed)
78 y.o. male, currently at [facility], with hx of debility mainly wheelchair dependent, heart failure with improved EF, hypertension, hyperlipidemia, hep C, CKD 3, who was brought in from SNF due to febrile illness.  Reportedly febrile to 104 F and treated with Tylenol.  Patient's only complaint is that he feels that he is had swelling around his ankles over the past few weeks.  He is not aware of any fevers, no chills.  No chest pain, dyspnea, cough/cold symptoms, abdominal pain, nausea, vomiting, diarrhea, dysuria, rashes.  He denies any bleeding sites.

## 2023-10-15 ENCOUNTER — Encounter (HOSPITAL_COMMUNITY): Payer: Self-pay | Admitting: Internal Medicine

## 2023-10-15 DIAGNOSIS — E860 Dehydration: Principal | ICD-10-CM

## 2023-10-15 DIAGNOSIS — I9589 Other hypotension: Secondary | ICD-10-CM | POA: Diagnosis not present

## 2023-10-15 LAB — COMPREHENSIVE METABOLIC PANEL
ALT: 38 U/L (ref 0–44)
AST: 33 U/L (ref 15–41)
Albumin: 2.2 g/dL — ABNORMAL LOW (ref 3.5–5.0)
Alkaline Phosphatase: 133 U/L — ABNORMAL HIGH (ref 38–126)
Anion gap: 8 (ref 5–15)
BUN: 17 mg/dL (ref 8–23)
CO2: 24 mmol/L (ref 22–32)
Calcium: 8.2 mg/dL — ABNORMAL LOW (ref 8.9–10.3)
Chloride: 102 mmol/L (ref 98–111)
Creatinine, Ser: 0.91 mg/dL (ref 0.61–1.24)
GFR, Estimated: >60 mL/min (ref >60)
Glucose, Bld: 107 mg/dL — ABNORMAL HIGH (ref 70–99)
Potassium: 3.1 mmol/L — ABNORMAL LOW (ref 3.5–5.1)
Sodium: 134 mmol/L — ABNORMAL LOW (ref 135–145)
Total Bilirubin: 0.5 mg/dL (ref 0.0–1.2)
Total Protein: 5.1 g/dL — ABNORMAL LOW (ref 6.5–8.1)

## 2023-10-15 LAB — CBC
HCT: 24.1 % — ABNORMAL LOW (ref 39.0–52.0)
Hemoglobin: 7.9 g/dL — ABNORMAL LOW (ref 13.0–17.0)
MCH: 30.6 pg (ref 26.0–34.0)
MCHC: 32.8 g/dL (ref 30.0–36.0)
MCV: 93.4 fL (ref 80.0–100.0)
Platelets: 205 10*3/uL (ref 150–400)
RBC: 2.58 MIL/uL — ABNORMAL LOW (ref 4.22–5.81)
RDW: 14.5 % (ref 11.5–15.5)
WBC: 4.6 10*3/uL (ref 4.0–10.5)
nRBC: 0 % (ref 0.0–0.2)

## 2023-10-15 LAB — DHEA-SULFATE: DHEA-SO4: 98.4 ug/dL (ref 20.8–226.4)

## 2023-10-15 LAB — PROTIME-INR
INR: 1.2 (ref 0.8–1.2)
Prothrombin Time: 15.8 s — ABNORMAL HIGH (ref 11.4–15.2)

## 2023-10-15 LAB — ACTH: C206 ACTH: 15.4 pg/mL (ref 7.2–63.3)

## 2023-10-15 MED ORDER — ADULT MULTIVITAMIN W/MINERALS CH
1.0000 | ORAL_TABLET | Freq: Every day | ORAL | Status: DC
  Administered 2023-10-15 – 2023-10-16 (×2): 1 via ORAL
  Filled 2023-10-15 (×2): qty 1

## 2023-10-15 MED ORDER — POTASSIUM CHLORIDE CRYS ER 20 MEQ PO TBCR
40.0000 meq | EXTENDED_RELEASE_TABLET | Freq: Once | ORAL | Status: AC
  Administered 2023-10-15: 40 meq via ORAL
  Filled 2023-10-15: qty 2

## 2023-10-15 MED ORDER — ORAL CARE MOUTH RINSE: 15.0000 mL | OROMUCOSAL | Status: DC | PRN

## 2023-10-15 NOTE — Care Management Important Message (Signed)
Important Message  Patient Details  Name: NEEV MCBURNIE MRN: [id number] Date of Birth: 08/20/1946   Important Message Given:  N/A - LOS <3 / Initial given by admissions     Corey Harold 10/15/2023, 11:19 AM

## 2023-10-15 NOTE — Progress Notes (Signed)
Initial Nutrition Assessment  DOCUMENTATION CODES:   Not applicable  INTERVENTION:   Magic cup TID with meals, each supplement provides 290 kcal and 9 grams of protein. MVI with minerals daily.  NUTRITION DIAGNOSIS:   Inadequate oral intake related to poor appetite as evidenced by per patient/family report, percent weight loss.  GOAL:   Patient will meet greater than or equal to 90% of their needs  MONITOR:   PO intake, Supplement acceptance  REASON FOR ASSESSMENT:   Consult Assessment of nutrition requirement/status  ASSESSMENT:   78 yo male admitted with febrile illness, borderline sepsis. PMH includes gout, hepatitis C, cardiomyopathy, HLD, HTN, CHF, cirrhosis, CKD, CVD.  RD working remotely. Unable to complete NFPE or speak with patient at this time. Per chart review, patient has chronic issues with PO intake and has no appetite. Currently on a regular diet. Meal intakes documented at 10-50%. Ensure was ordered BID, but patient has been refusing. Will add magic cups with meals.  Labs reviewed. Na 134, K 3.1  Medications reviewed and include vitamin C, ferrous sulfate, protonix, klor-con x 1 dose.  Weight history reviewed. Patient with 5% weight loss within the past 3 months. Unable to obtain enough information at this time for identification of malnutrition.   NUTRITION - FOCUSED PHYSICAL EXAM:  Unable to complete  Diet Order:   Diet Order             Diet regular Room service appropriate? Yes; Fluid consistency: Thin  Diet effective now                   EDUCATION NEEDS:   Not appropriate for education at this time  Skin:  Skin Assessment: Reviewed RN Assessment  Last BM:  unknown  Height:   Ht Readings from Last 1 Encounters:  10/14/23 6' (1.829 m)    Weight:   Wt Readings from Last 1 Encounters:  10/14/23 74.8 kg    Ideal Body Weight:  80.9 kg  BMI:  Body mass index is 22.37 kg/m.  Estimated Nutritional Needs:   Kcal:   1900-2100  Protein:  90-110 gm  Fluid:  1.9-2.1 L   Gabriel Rainwater RD, LDN, CNSC Contact Inpatient RD using Secure Chat. If unavailable, use group chat "RD Inpatient" via Secure Chat in EP[employer].

## 2023-10-15 NOTE — Progress Notes (Signed)
Mobility Specialist Progress Note:    10/15/23 1400  Mobility  Activity Transferred from chair to bed  Level of Assistance Maximum assist, patient does 25-49%  Assistive Device Front wheel walker  Distance Ambulated (ft) 2 ft  Range of Motion/Exercises Active;All extremities  Activity Response Tolerated well  Mobility Referral Yes  Mobility visit 1 Mobility  Mobility Specialist Start Time (ACUTE ONLY) 1400  Mobility Specialist Stop Time (ACUTE ONLY) 1415  Mobility Specialist Time Calculation (min) (ACUTE ONLY) 15 min   Pt requested assistance to bed. Required MaxA to stand and transfer with RW. Tolerated well, asx throughout. Left pt supine, alarm on. All needs met.  Shauna Bodkins Mobility Specialist Please contact via Special educational needs teacher or  Rehab office at [phone]

## 2023-10-15 NOTE — Plan of Care (Signed)
  Problem: Education: Goal: Knowledge of General Education information will improve Description: Including pain rating scale, medication(s)/side effects and non-pharmacologic comfort measures Outcome: Progressing   Problem: Health Behavior/Discharge Planning: Goal: Ability to manage health-related needs will improve Outcome: Progressing   Problem: Clinical Measurements: Goal: Ability to maintain clinical measurements within normal limits will improve Outcome: Progressing Goal: Respiratory complications will improve Outcome: Progressing Goal: Cardiovascular complication will be avoided Outcome: Progressing   Problem: Activity: Goal: Risk for activity intolerance will decrease Outcome: Progressing   Problem: Nutrition: Goal: Adequate nutrition will be maintained Outcome: Progressing   Problem: Coping: Goal: Level of anxiety will decrease Outcome: Progressing   Problem: Pain Managment: Goal: General experience of comfort will improve and/or be controlled Outcome: Progressing   Problem: Safety: Goal: Ability to remain free from injury will improve Outcome: Progressing   Problem: Skin Integrity: Goal: Risk for impaired skin integrity will decrease Outcome: Progressing   Problem: Education: Goal: Knowledge of General Education information will improve Description: Including pain rating scale, medication(s)/side effects and non-pharmacologic comfort measures Outcome: Progressing   Problem: Health Behavior/Discharge Planning: Goal: Ability to manage health-related needs will improve Outcome: Progressing   Problem: Clinical Measurements: Goal: Ability to maintain clinical measurements within normal limits will improve Outcome: Progressing Goal: Respiratory complications will improve Outcome: Progressing Goal: Cardiovascular complication will be avoided Outcome: Progressing   Problem: Activity: Goal: Risk for activity intolerance will decrease Outcome: Progressing    Problem: Nutrition: Goal: Adequate nutrition will be maintained Outcome: Progressing   Problem: Coping: Goal: Level of anxiety will decrease Outcome: Progressing   Problem: Pain Managment: Goal: General experience of comfort will improve and/or be controlled Outcome: Progressing   Problem: Safety: Goal: Ability to remain free from injury will improve Outcome: Progressing   Problem: Skin Integrity: Goal: Risk for impaired skin integrity will decrease Outcome: Progressing

## 2023-10-15 NOTE — Progress Notes (Signed)
PROGRESS NOTE   Don [name]  EXB:[id number] DOB: Nov 27, 1945 DOA: 10/13/2023 PCP: [facility]ciano Ort [facility]   Chief Complaint  Patient presents with   Possible Sepsis   Level of care: Telemetry  Brief Admission History:  78 y.o. male, currently at [facility], with hx of debility mainly wheelchair dependent, heart failure with improved EF, hypertension, hyperlipidemia, hep C, CKD 3, who was brought in from SNF due to febrile illness.  Reportedly febrile to 104 F and treated with Tylenol.  Patient's only complaint is that he feels that he is had swelling around his ankles over the past few weeks.  He is not aware of any fevers, no chills.  No chest pain, dyspnea, cough/cold symptoms, abdominal pain, nausea, vomiting, diarrhea, dysuria, rashes.  He denies any bleeding sites.      Assessment and Plan:  Dehydration -- from poor oral intake -- responded well to IV fluid -- eating and drinking better now  Adult FTT  DNR present on admission  -- appreciate dietitian consultation and recommendations -- pt agreeable to return to [facility]  -- continue DNR order in hospital   Hypotension -- BPs much improved on midodrine  B12 deficiency  -- much improved with supplementation  Sepsis ruled out  HFpEF -- stable and compensated   DVT prophylaxis: SCDs Code Status: DNR  Family Communication:  Disposition: return to [place]    Consultants:  dietitian   Subjective: No complaints, eating breakfast;   Objective: Vitals:   10/14/23 2109 10/15/23 0425 10/15/23 0735 10/15/23 1324  BP: 114/66 119/66  (!) 108/57  Pulse: 68 72  (!) 103  Resp: 19 19  19   Temp: 97.6 F (36.4 C) 98.3 F (36.8 C)  (!) 97.5 F (36.4 C)  TempSrc: Oral Oral  Oral  SpO2: 100% 100% 100% 100%  Weight:      Height:        Intake/Output Summary (Last 24 hours) at 10/15/2023 1805 Last data filed at 10/15/2023 0347 Gross per 24 hour  Intake 400 ml  Output --  Net 400 ml   Filed  Weights   10/13/23 1958 10/14/23 0614  Weight: 72.6 kg 74.8 kg   Examination:  General exam: frail, elderly male, sitting up in bed, eating, Appears calm and comfortable  Respiratory system: Clear to auscultation. Respiratory effort normal. Cardiovascular system: normal S1 & S2 heard. No JVD, murmurs, rubs, gallops or clicks. No pedal edema. Gastrointestinal system: Abdomen is nondistended, soft and nontender. No organomegaly or masses felt. Normal bowel sounds heard. Central nervous system: Alert and oriented. No focal neurological deficits. Extremities: Symmetric 5 x 5 power. Skin: No rashes, lesions or ulcers. Psychiatry: Judgement and insight appear normal. Mood & affect appropriate.   Data Reviewed: I have personally reviewed following labs and imaging studies  CBC: Recent Labs  Lab 10/13/23 2019 10/14/23 0636 10/15/23 0520  WBC 5.2 3.9* 4.6  NEUTROABS 4.3  --   --   HGB 8.1* 8.3* 7.9*  HCT 24.6* 25.6* 24.1*  MCV 93.2 95.5 93.4  PLT 209 203 205    Basic Metabolic Panel: Recent Labs  Lab 10/13/23 2019 10/14/23 0636 10/15/23 0520  NA 131* 134* 134*  K 3.9 3.7 3.1*  CL 100 101 102  CO2 26 23 24   GLUCOSE 104* 90 107*  BUN 18 20 17   CREATININE 1.32* 1.06 0.91  CALCIUM 8.2* 8.0* 8.2*  MG  --  1.7  --   PHOS  --  3.7  --  CBG: No results for input(s): "GLUCAP" in the last 168 hours.  Recent Results (from the past 240 hours)  Resp panel by RT-PCR (RSV, Flu A&B, Covid) Anterior Nasal Swab     Status: None   Collection Time: 10/13/23  8:10 PM   Specimen: Anterior Nasal Swab  Result Value Ref Range Status   SARS Coronavirus 2 by RT PCR NEGATIVE NEGATIVE Final    Comment: (NOTE) SARS-CoV-2 target nucleic acids are NOT DETECTED.  The SARS-CoV-2 RNA is generally detectable in upper respiratory specimens during the acute phase of infection. The lowest concentration of SARS-CoV-2 viral copies this assay can detect is 138 copies/mL. A negative result does not  preclude SARS-Cov-2 infection and should not be used as the sole basis for treatment or other patient management decisions. A negative result may occur with  improper specimen collection/handling, submission of specimen other than nasopharyngeal swab, presence of viral mutation(s) within the areas targeted by this assay, and inadequate number of viral copies(<138 copies/mL). A negative result must be combined with clinical observations, patient history, and epidemiological information. The expected result is Negative.  Fact Sheet for Patients:  BloggerCourse.com  Fact Sheet for Healthcare Providers:  SeriousBroker.it  This test is no t yet approved or cleared by the [place] FDA and  has been authorized for detection and/or diagnosis of SARS-CoV-2 by FDA under an Emergency Use Authorization (EUA). This EUA will remain  in effect (meaning this test can be used) for the duration of the COVID-19 declaration under Section 564(b)(1) of the Act, 21 U.S.C.section 360bbb-3(b)(1), unless the authorization is terminated  or revoked sooner.       Influenza A by PCR NEGATIVE NEGATIVE Final   Influenza B by PCR NEGATIVE NEGATIVE Final    Comment: (NOTE) The Xpert Xpress SARS-CoV-2/FLU/RSV plus assay is intended as an aid in the diagnosis of influenza from Nasopharyngeal swab specimens and should not be used as a sole basis for treatment. Nasal washings and aspirates are unacceptable for Xpert Xpress SARS-CoV-2/FLU/RSV testing.  Fact Sheet for Patients: BloggerCourse.com  Fact Sheet for Healthcare Providers: SeriousBroker.it  This test is not yet approved or cleared by the [place] FDA and has been authorized for detection and/or diagnosis of SARS-CoV-2 by FDA under an Emergency Use Authorization (EUA). This EUA will remain in effect (meaning this test can be used) for the  duration of the COVID-19 declaration under Section 564(b)(1) of the Act, 21 U.S.C. section 360bbb-3(b)(1), unless the authorization is terminated or revoked.     Resp Syncytial Virus by PCR NEGATIVE NEGATIVE Final    Comment: (NOTE) Fact Sheet for Patients: BloggerCourse.com  Fact Sheet for Healthcare Providers: SeriousBroker.it  This test is not yet approved or cleared by the [place] FDA and has been authorized for detection and/or diagnosis of SARS-CoV-2 by FDA under an Emergency Use Authorization (EUA). This EUA will remain in effect (meaning this test can be used) for the duration of the COVID-19 declaration under Section 564(b)(1) of the Act, 21 U.S.C. section 360bbb-3(b)(1), unless the authorization is terminated or revoked.  Performed at [facility], [address] [place], [place] [address]   Blood Culture (routine x 2)     Status: None (Preliminary result)   Collection Time: 10/13/23  8:19 PM   Specimen: BLOOD  Result Value Ref Range Status   Specimen Description BLOOD BLOOD RIGHT ARM forearm  Final   Special Requests NONE  Final   Culture   Final    NO GROWTH 2  DAYS Performed at [facility], [address], [place], [place] [address]    Report Status PENDING  Incomplete  Blood Culture (routine x 2)     Status: None (Preliminary result)   Collection Time: 10/13/23  8:40 PM   Specimen: BLOOD  Result Value Ref Range Status   Specimen Description BLOOD BLOOD RIGHT ARM Antecubital  Final   Special Requests NONE  Final   Culture   Final    NO GROWTH 2 DAYS Performed at [facility], [address] [place], [place] [address]    Report Status PENDING  Incomplete  Respiratory (~20 pathogens) panel by PCR     Status: None   Collection Time: 10/14/23  2:33 AM   Specimen: Nasopharyngeal Swab; Respiratory  Result Value Ref Range Status   Adenovirus NOT DETECTED NOT DETECTED Final   Coronavirus 229E NOT DETECTED  NOT DETECTED Final    Comment: (NOTE) The Coronavirus on the Respiratory Panel, DOES NOT test for the novel  Coronavirus (2019 nCoV)    Coronavirus HKU1 NOT DETECTED NOT DETECTED Final   Coronavirus NL63 NOT DETECTED NOT DETECTED Final   Coronavirus OC43 NOT DETECTED NOT DETECTED Final   Metapneumovirus NOT DETECTED NOT DETECTED Final   Rhinovirus / Enterovirus NOT DETECTED NOT DETECTED Final   Influenza A NOT DETECTED NOT DETECTED Final   Influenza B NOT DETECTED NOT DETECTED Final   Parainfluenza Virus 1 NOT DETECTED NOT DETECTED Final   Parainfluenza Virus 2 NOT DETECTED NOT DETECTED Final   Parainfluenza Virus 3 NOT DETECTED NOT DETECTED Final   Parainfluenza Virus 4 NOT DETECTED NOT DETECTED Final   Respiratory Syncytial Virus NOT DETECTED NOT DETECTED Final   Bordetella pertussis NOT DETECTED NOT DETECTED Final   Bordetella Parapertussis NOT DETECTED NOT DETECTED Final   Chlamydophila pneumoniae NOT DETECTED NOT DETECTED Final   Mycoplasma pneumoniae NOT DETECTED NOT DETECTED Final    Comment: Performed at [facility], [address], [place], [place] [address]  MRSA Next Gen by PCR, Nasal     Status: None   Collection Time: 10/14/23  2:24 PM   Specimen: Nasal Mucosa; Nasal Swab  Result Value Ref Range Status   MRSA by PCR Next Gen NOT DETECTED NOT DETECTED Final    Comment: (NOTE) The GeneXpert MRSA Assay (FDA approved for NASAL specimens only), is one component of a comprehensive MRSA colonization surveillance program. It is not intended to diagnose MRSA infection nor to guide or monitor treatment for MRSA infections. Test performance is not FDA approved in patients less than 78 years old. Performed at [facility], [address] [place], [place] [address]      Radiology Studies: CT ABDOMEN PELVIS W CONTRAST Result Date: 10/14/2023 CLINICAL DATA:  78 year old male with history of sepsis. Evaluate for intra-abdominal source of infection. EXAM: CT ABDOMEN AND  PELVIS WITH CONTRAST TECHNIQUE: Multidetector CT imaging of the abdomen and pelvis was performed using the standard protocol following bolus administration of intravenous contrast. RADIATION DOSE REDUCTION: This exam was performed according to the departmental dose-optimization program which includes automated exposure control, adjustment of the mA and/or kV according to patient size and/or use of iterative reconstruction technique. CONTRAST:  OMNIPAQUE IOHEXOL 300 MG/ML  SOLN COMPARISON:  CT of the abdomen and pelvis 07/24/2023. FINDINGS: Lower chest: Mild scarring in the visualize lung bases. Atherosclerotic calcifications are noted in the descending thoracic aorta as well as the left main, left anterior descending, left circumflex and right coronary arteries. Hepatobiliary: Liver  has a shrunken appearance and nodular contour, indicative of underlying cirrhosis. No suspicious cystic or solid hepatic lesions. No intra or extrahepatic biliary ductal dilatation. Gallbladder is unremarkable in appearance. Pancreas: No pancreatic mass. No pancreatic ductal dilatation. Trace amount of fluid adjacent to the tail of the pancreas. No well organized pancreatic or peripancreatic fluid collections. Spleen: Unremarkable. Adrenals/Urinary Tract: Mild bilateral perinephric stranding (nonspecific). 1 cm low-attenuation lesion in the interpolar region of the right kidney compatible with a simple (Bosniak class 1) cysts, which requires no imaging follow-up. No aggressive appearing renal lesions. No hydroureteronephrosis. Urinary bladder is distended, but otherwise unremarkable in appearance. Bilateral adrenal glands are normal in appearance. Stomach/Bowel: The appearance of the stomach is normal. There is no pathologic dilatation of small bowel or colon. Numerous colonic diverticuli are noted, without surrounding inflammatory changes to indicate an acute diverticulitis at this time. The appendix is not confidently identified  and may be surgically absent. Regardless, there are no inflammatory changes noted adjacent to the cecum to suggest the presence of an acute appendicitis at this time. Vascular/Lymphatic: Atherosclerosis throughout the abdominal aorta and pelvic vasculature, without evidence of aneurysm or dissection. No lymphadenopathy noted in the abdomen or pelvis. Reproductive: Prostate gland and seminal vesicles are unremarkable in appearance. Other: No significant volume of ascites.  No pneumoperitoneum. Musculoskeletal: There are no aggressive appearing lytic or blastic lesions noted in the visualized portions of the skeleton. IMPRESSION: 1. Trace amount of fluid adjacent to the tail of the pancreas. This is nonspecific, but could indicate a mild acute interstitial pancreatitis. Correlation with serum lipase is recommended. 2. Cirrhosis. 3. Colonic diverticulosis without evidence of acute diverticulitis at this time. 4. Aortic atherosclerosis, in addition to left main and three-vessel coronary artery disease. Aortic Atherosclerosis (ICD10-I70.0). Electronically Signed   By: Trudie Reed M.D.   On: 10/14/2023 06:04   DG Chest Port 1 View Result Date: 10/13/2023 CLINICAL DATA:  Sepsis, febrile EXAM: PORTABLE CHEST 1 VIEW COMPARISON:  09/29/2023 FINDINGS: Single frontal view of the chest demonstrates a stable cardiac silhouette. Chronic elevation of the right hemidiaphragm. Left hemidiaphragm is excluded by collimation. No airspace disease, effusion, or pneumothorax. No acute bony abnormalities. IMPRESSION: 1. Stable chest, no acute process. Electronically Signed   By: Sharlet Salina M.D.   On: 10/13/2023 20:30    Scheduled Meds:  allopurinol  150 mg Oral Daily   ascorbic acid  500 mg Oral Q breakfast   aspirin EC  81 mg Oral Daily   feeding supplement  237 mL Oral BID BM   ferrous sulfate  325 mg Oral Q breakfast   midodrine  5 mg Oral TID WC   mometasone-formoterol  2 puff Inhalation BID   And   umeclidinium  bromide  1 puff Inhalation Daily   multivitamin with minerals  1 tablet Oral Daily   pantoprazole  40 mg Oral Daily   pravastatin  40 mg Oral Daily   sodium chloride flush  3 mL Intravenous Q12H   Continuous Infusions:   LOS: 2 days   Time spent: 47 mins  Charrise Lardner Laural Benes, MD How to contact the [facility] Attending or Consulting provider 7A - 7P or covering provider during after hours 7P -7A, for this patient?  Check the care team in [facility] and look for a) attending/consulting TRH provider listed and b) the [facility] team listed Log into [email or web address] to find provider on call.  Locate the [facility] provider you are looking for under [facility] and page to a number that  you can be directly reached. If you still have difficulty reaching the provider, please page the [facility] (Director on Call) for the Hospitalists listed on amion for assistance.  10/15/2023, 6:05 PM

## 2023-10-15 NOTE — Plan of Care (Signed)
  Problem: Acute Rehab OT Goals (only OT should resolve) Goal: Pt. Will Perform Grooming Flowsheets (Taken 10/15/2023 1153) Pt Will Perform Grooming:  with modified independence  sitting Goal: Pt. Will Perform Upper Body Dressing Flowsheets (Taken 10/15/2023 1153) Pt Will Perform Upper Body Dressing:  with modified independence  sitting Goal: Pt. Will Perform Lower Body Dressing Flowsheets (Taken 10/15/2023 1153) Pt Will Perform Lower Body Dressing:  with min assist  sitting/lateral leans Goal: Pt. Will Transfer To Toilet Flowsheets (Taken 10/15/2023 1153) Pt Will Transfer to Toilet:  with contact guard assist  with min assist  stand pivot transfer Goal: Pt. Will Perform Toileting-Clothing Manipulation Flowsheets (Taken 10/15/2023 1153) Pt Will Perform Toileting - Clothing Manipulation and hygiene:  with min assist  with contact guard assist  sitting/lateral leans Goal: Pt/Caregiver Will Perform Home Exercise Program Flowsheets (Taken 10/15/2023 1153) Pt/caregiver will Perform Home Exercise Program:  Increased ROM  Increased strength  Both right and left upper extremity  Independently  Santa Abdelrahman OT, MOT

## 2023-10-16 DIAGNOSIS — I9589 Other hypotension: Secondary | ICD-10-CM | POA: Diagnosis not present

## 2023-10-16 DIAGNOSIS — E860 Dehydration: Secondary | ICD-10-CM | POA: Diagnosis not present

## 2023-10-16 DIAGNOSIS — A419 Sepsis, unspecified organism: Secondary | ICD-10-CM | POA: Diagnosis not present

## 2023-10-16 LAB — COMPREHENSIVE METABOLIC PANEL
ALT: 36 U/L (ref 0–44)
AST: 30 U/L (ref 15–41)
Albumin: 2.2 g/dL — ABNORMAL LOW (ref 3.5–5.0)
Alkaline Phosphatase: 115 U/L (ref 38–126)
Anion gap: 6 (ref 5–15)
BUN: 12 mg/dL (ref 8–23)
CO2: 23 mmol/L (ref 22–32)
Calcium: 8.2 mg/dL — ABNORMAL LOW (ref 8.9–10.3)
Chloride: 108 mmol/L (ref 98–111)
Creatinine, Ser: 0.88 mg/dL (ref 0.61–1.24)
GFR, Estimated: >60 mL/min (ref >60)
Glucose, Bld: 85 mg/dL (ref 70–99)
Potassium: 3.4 mmol/L — ABNORMAL LOW (ref 3.5–5.1)
Sodium: 137 mmol/L (ref 135–145)
Total Bilirubin: 0.5 mg/dL (ref 0.0–1.2)
Total Protein: 5.1 g/dL — ABNORMAL LOW (ref 6.5–8.1)

## 2023-10-16 MED ORDER — FERROUS SULFATE 325 (65 FE) MG PO TABS: 325.0000 mg | ORAL_TABLET | Freq: Every day | ORAL | Status: AC

## 2023-10-16 MED ORDER — ENSURE ENLIVE PO LIQD: 237.0000 mL | Freq: Two times a day (BID) | ORAL | Status: AC

## 2023-10-16 MED ORDER — ADULT MULTIVITAMIN W/MINERALS CH: 1.0000 | ORAL_TABLET | Freq: Every day | ORAL | Status: AC

## 2023-10-16 MED ORDER — CYANOCOBALAMIN 1000 MCG/ML IJ SOLN: 1000.0000 ug | INTRAMUSCULAR | Status: AC

## 2023-10-16 MED ORDER — ALBUTEROL SULFATE (2.5 MG/3ML) 0.083% IN NEBU: 2.5000 mg | INHALATION_SOLUTION | RESPIRATORY_TRACT | Status: AC | PRN

## 2023-10-16 MED ORDER — ASCORBIC ACID 500 MG PO TABS: 500.0000 mg | ORAL_TABLET | Freq: Every day | ORAL | Status: AC

## 2023-10-16 MED ORDER — MIDODRINE HCL 5 MG PO TABS: 5.0000 mg | ORAL_TABLET | Freq: Three times a day (TID) | ORAL | Status: DC

## 2023-10-16 NOTE — Discharge Instructions (Signed)
IMPORTANT INFORMATION: PAY CLOSE ATTENTION   PHYSICIAN DISCHARGE INSTRUCTIONS  Follow with Primary care provider  [facility], [facility]  and other consultants as instructed by your Hospitalist Physician  SEEK MEDICAL CARE OR RETURN TO EMERGENCY ROOM IF SYMPTOMS COME BACK, WORSEN OR NEW PROBLEM DEVELOPS   Please note: You were cared for by a hospitalist during your hospital stay. Every effort will be made to forward records to your primary care provider.  You can request that your primary care provider send for your hospital records if they have not received them.  Once you are discharged, your primary care physician will handle any further medical issues. Please note that NO REFILLS for any discharge medications will be authorized once you are discharged, as it is imperative that you return to your primary care physician (or establish a relationship with a primary care physician if you do not have one) for your post hospital discharge needs so that they can reassess your need for medications and monitor your lab values.  Please get a complete blood count and chemistry panel checked by your Primary MD at your next visit, and again as instructed by your Primary MD.  Get Medicines reviewed and adjusted: Please take all your medications with you for your next visit with your Primary MD  Laboratory/radiological data: Please request your Primary MD to go over all hospital tests and procedure/radiological results at the follow up, please ask your primary care provider to get all Hospital records sent to his/her office.  In some cases, they will be blood work, cultures and biopsy results pending at the time of your discharge. Please request that your primary care provider follow up on these results.  If you are diabetic, please bring your blood sugar readings with you to your follow up appointment with primary care.    Please call and make your follow up appointments as soon as possible.    Also  Note the following: If you experience worsening of your admission symptoms, develop shortness of breath, life threatening emergency, suicidal or homicidal thoughts you must seek medical attention immediately by calling 911 or calling your MD immediately  if symptoms less severe.  You must read complete instructions/literature along with all the possible adverse reactions/side effects for all the Medicines you take and that have been prescribed to you. Take any new Medicines after you have completely understood and accpet all the possible adverse reactions/side effects.   Do not drive when taking Pain medications or sleeping medications (Benzodiazepines)  Do not take more than prescribed Pain, Sleep and Anxiety Medications. It is not advisable to combine anxiety,sleep and pain medications without talking with your primary care practitioner  Special Instructions: If you have smoked or chewed Tobacco  in the last 2 yrs please stop smoking, stop any regular Alcohol  and or any Recreational drug use.  Wear Seat belts while driving.  Do not drive if taking any narcotic, mind altering or controlled substances or recreational drugs or alcohol.       

## 2023-10-16 NOTE — Care Management Important Message (Signed)
Important Message  Patient Details  Name: Don [name] MRN: [id number] Date of Birth: 1945/11/17   Important Message Given:  N/A - LOS <3 / Initial given by admissions     Corey Harold 10/16/2023, 11:32 AM

## 2023-10-16 NOTE — Progress Notes (Signed)
Mobility Specialist Progress Note:    10/16/23 1145  Mobility  Activity Transferred from bed to chair  Level of Assistance Maximum assist, patient does 25-49%  Assistive Device Front wheel walker  Distance Ambulated (ft) 3 ft  Range of Motion/Exercises Active;All extremities  Activity Response Tolerated well  Mobility Referral Yes  Mobility visit 1 Mobility  Mobility Specialist Start Time (ACUTE ONLY) 1145  Mobility Specialist Stop Time (ACUTE ONLY) 1200  Mobility Specialist Time Calculation (min) (ACUTE ONLY) 15 min   Pt received in bed, agreeable to mobility. Required MaxA to stand and transfer with RW. Tolerated well, asx throughout. Left pt in chair. Alarm on, call bell in reach. All needs met.  Michaell Grider Mobility Specialist Please contact via Special educational needs teacher or  Rehab office at [phone]

## 2023-10-16 NOTE — TOC Transition Note (Signed)
Transition of Care [facility]) - Discharge Note   Patient Details  Name: Don [name] MRN: [id number] Date of Birth: 07/04/1946  Transition [facility] CM/SW Contact:  Leitha Bleak, RN Phone Number: 10/16/2023, 11:38 AM   Clinical Narrative:   Insurance approved, 1/20 - 1/22, next review date 1/22, Vesta Mixer id [id number], plan auth id [id number] for [facility]. TOC called for a room number. Team updated, TOC following to call EMS.   Final next level of care: Skilled Nursing Facility Barriers to Discharge: Barriers Resolved   Patient Goals and CMS Choice Patient states their goals for this hospitalization and ongoing recovery are:: go back to SNF CMS Medicare.gov Compare Post Acute Care list provided to:: Patient Represenative (must comment) Choice offered to / list presented to : Sibling     Discharge Placement               Patient to be transferred to facility by: EMS Name of family member notified: sister Patient and family notified of of transfer: 10/16/23  Discharge Plan and Services Additional resources added to the After Visit Summary for   In-house Referral: Clinical Social Work Discharge Planning Services: CM Consult               Social Drivers of Health (SDOH) Interventions SDOH Screenings   Food Insecurity: No Food Insecurity (10/14/2023)  Housing: Low Risk  (10/14/2023)  Transportation Needs: No Transportation Needs (10/14/2023)  Utilities: Not At Risk (10/14/2023)  Social Connections: Unknown (10/14/2023)  Tobacco Use: Medium Risk (09/28/2023)     Readmission Risk Interventions    10/14/2023   11:02 AM  Readmission Risk Prevention Plan  Transportation Screening Complete  HRI or Home Care Consult Complete  Social Work Consult for Recovery Care Planning/Counseling Complete  Palliative Care Screening Not Applicable  Medication Review Oceanographer) Complete

## 2023-10-16 NOTE — Progress Notes (Signed)
Report called to Delsa Bern, RN at [employer]. EMS transportation called at this time.

## 2023-10-16 NOTE — Discharge Summary (Signed)
Physician Discharge Summary  Don [name]:[id number] DOB: 10/18/1945 DOA: 10/13/2023  PCP: Ponciano Ort [facility]  Admit date: 10/13/2023 Discharge date: 10/16/2023  Admitted From: [facility] Disposition: [facility]   Recommendations for Outpatient Follow-up:  Follow up with PCP in 2 weeks Please obtain BMP/CBC in 1-2 weeks Please check B12 level in 1 month  Discharge Condition: STABLE   CODE STATUS: DNR  DIET: soft foods advance as tolerated to regular   Brief Hospitalization Summary: Please see all hospital notes, images, labs for full details of the hospitalization. Admission provider HPI: 78 y.o. male, currently at [facility], with hx of debility mainly wheelchair dependent, heart failure with improved EF, hypertension, hyperlipidemia, hep C, CKD 3, who was brought in from SNF due to febrile illness.  Reportedly febrile to 104 F and treated with Tylenol.  Patient's only complaint is that he feels that he is had swelling around his ankles over the past few weeks.  He is not aware of any fevers, no chills.  No chest pain, dyspnea, cough/cold symptoms, abdominal pain, nausea, vomiting, diarrhea, dysuria, rashes.  He denies any bleeding sites.    HOSPITAL COURSE BY PROBLEM LIST  Dehydration - TREATED  -- from poor oral intake -- responded well to IV fluid -- eating and drinking better now   Adult FTT  DNR present on admission  -- appreciate dietitian consultation and recommendations -- pt agreeable to return to [facility]  -- continue DNR order in hospital -- recommend outpatient palliative medicine consultation    Hypotension -- BPs much improved on midodrine 5 mg TID -- holding all antihypertensives   B12 deficiency  -- much improved with supplementation -- recommend rechecking B12 in 1 month   Sepsis ruled out   HFpEF -- stable and compensated   Discharge Diagnoses:  Principal Problem:   Hypotension Active Problems:   Sepsis (HCC)    Dehydration   Discharge Instructions:  Allergies as of 10/16/2023       Reactions   Penicillins Swelling        Medication List     STOP taking these medications    carvedilol 3.125 MG tablet Commonly known as: COREG       TAKE these medications    acetaminophen 650 MG CR tablet Commonly known as: TYLENOL Take 650 mg by mouth every 4 (four) hours as needed (general discomfort, elevated temp).   albuterol 108 (90 Base) MCG/ACT inhaler Commonly known as: VENTOLIN HFA Inhale 2 puffs into the lungs every 4 (four) hours as needed for wheezing or shortness of breath. What changed: Another medication with the same name was added. Make sure you understand how and when to take each.   albuterol (2.5 MG/3ML) 0.083% nebulizer solution Commonly known as: PROVENTIL Take 3 mLs (2.5 mg total) by nebulization every 4 (four) hours as needed for wheezing or shortness of breath. What changed: You were already taking a medication with the same name, and this prescription was added. Make sure you understand how and when to take each.   allopurinol 100 MG tablet Commonly known as: Zyloprim Take 1.5 tablets (150 mg total) by mouth daily.   ascorbic acid 500 MG tablet Commonly known as: VITAMIN C Take 1 tablet (500 mg total) by mouth daily with breakfast. Start taking on: October 17, 2023   aspirin EC 81 MG tablet Take 81 mg by mouth daily.   calcitRIOL 0.25 MCG capsule Commonly known as: ROCALTROL Take 0.25 mcg by mouth daily.  cholecalciferol 25 MCG (1000 UNIT) tablet Commonly known as: VITAMIN D3 Take 1,000 Units by mouth daily.   colchicine 0.6 MG tablet Take 1.2 mg by mouth every 8 (eight) hours as needed (gout flare).   cyanocobalamin 1000 MCG/ML injection Commonly known as: VITAMIN B12 Inject 1 mL (1,000 mcg total) into the muscle See admin instructions. Inject into the muscle twice weekly on Monday, Friday for 4 weeks. Then inject once every 30 days  starting on 12/08/23.   feeding supplement Liqd Take 237 mLs by mouth 2 (two) times daily between meals.   ferrous sulfate 325 (65 FE) MG tablet Take 1 tablet (325 mg total) by mouth daily with breakfast. Start taking on: October 17, 2023   midodrine 5 MG tablet Commonly known as: PROAMATINE Take 1 tablet (5 mg total) by mouth 3 (three) times daily with meals.   multivitamin with minerals Tabs tablet Take 1 tablet by mouth daily. Start taking on: October 17, 2023   ondansetron 4 MG tablet Commonly known as: ZOFRAN Take 1 tablet (4 mg total) by mouth every 6 (six) hours. What changed:  when to take this reasons to take this   pantoprazole 40 MG tablet Commonly known as: PROTONIX Take 1 tablet (40 mg total) by mouth daily.   rosuvastatin 5 MG tablet Commonly known as: CRESTOR Take 5 mg by mouth daily.   Spiriva Respimat 2.5 MCG/ACT Aers Generic drug: Tiotropium Bromide Monohydrate Inhale 2 puffs into the lungs daily.        Contact information for after-discharge care     Destination     [facility] .   Service: Skilled Nursing Contact information: [address] [place] [address] [phone]                    Allergies  Allergen Reactions   Penicillins Swelling   Allergies as of 10/16/2023       Reactions   Penicillins Swelling        Medication List     STOP taking these medications    carvedilol 3.125 MG tablet Commonly known as: COREG       TAKE these medications    acetaminophen 650 MG CR tablet Commonly known as: TYLENOL Take 650 mg by mouth every 4 (four) hours as needed (general discomfort, elevated temp).   albuterol 108 (90 Base) MCG/ACT inhaler Commonly known as: VENTOLIN HFA Inhale 2 puffs into the lungs every 4 (four) hours as needed for wheezing or shortness of breath. What changed: Another medication with the same name was added. Make sure you  understand how and when to take each.   albuterol (2.5 MG/3ML) 0.083% nebulizer solution Commonly known as: PROVENTIL Take 3 mLs (2.5 mg total) by nebulization every 4 (four) hours as needed for wheezing or shortness of breath. What changed: You were already taking a medication with the same name, and this prescription was added. Make sure you understand how and when to take each.   allopurinol 100 MG tablet Commonly known as: Zyloprim Take 1.5 tablets (150 mg total) by mouth daily.   ascorbic acid 500 MG tablet Commonly known as: VITAMIN C Take 1 tablet (500 mg total) by mouth daily with breakfast. Start taking on: October 17, 2023   aspirin EC 81 MG tablet Take 81 mg by mouth daily.   calcitRIOL 0.25 MCG capsule Commonly known as: ROCALTROL Take 0.25 mcg by mouth daily.   cholecalciferol 25 MCG (  1000 UNIT) tablet Commonly known as: VITAMIN D3 Take 1,000 Units by mouth daily.   colchicine 0.6 MG tablet Take 1.2 mg by mouth every 8 (eight) hours as needed (gout flare).   cyanocobalamin 1000 MCG/ML injection Commonly known as: VITAMIN B12 Inject 1 mL (1,000 mcg total) into the muscle See admin instructions. Inject into the muscle twice weekly on Monday, Friday for 4 weeks. Then inject once every 30 days starting on 12/08/23.   feeding supplement Liqd Take 237 mLs by mouth 2 (two) times daily between meals.   ferrous sulfate 325 (65 FE) MG tablet Take 1 tablet (325 mg total) by mouth daily with breakfast. Start taking on: October 17, 2023   midodrine 5 MG tablet Commonly known as: PROAMATINE Take 1 tablet (5 mg total) by mouth 3 (three) times daily with meals.   multivitamin with minerals Tabs tablet Take 1 tablet by mouth daily. Start taking on: October 17, 2023   ondansetron 4 MG tablet Commonly known as: ZOFRAN Take 1 tablet (4 mg total) by mouth every 6 (six) hours. What changed:  when to take this reasons to take this   pantoprazole 40 MG  tablet Commonly known as: PROTONIX Take 1 tablet (40 mg total) by mouth daily.   rosuvastatin 5 MG tablet Commonly known as: CRESTOR Take 5 mg by mouth daily.   Spiriva Respimat 2.5 MCG/ACT Aers Generic drug: Tiotropium Bromide Monohydrate Inhale 2 puffs into the lungs daily.        Procedures/Studies: CT ABDOMEN PELVIS W CONTRAST Result Date: 10/14/2023 CLINICAL DATA:  78 year old male with history of sepsis. Evaluate for intra-abdominal source of infection. EXAM: CT ABDOMEN AND PELVIS WITH CONTRAST TECHNIQUE: Multidetector CT imaging of the abdomen and pelvis was performed using the standard protocol following bolus administration of intravenous contrast. RADIATION DOSE REDUCTION: This exam was performed according to the departmental dose-optimization program which includes automated exposure control, adjustment of the mA and/or kV according to patient size and/or use of iterative reconstruction technique. CONTRAST:  OMNIPAQUE IOHEXOL 300 MG/ML  SOLN COMPARISON:  CT of the abdomen and pelvis 07/24/2023. FINDINGS: Lower chest: Mild scarring in the visualize lung bases. Atherosclerotic calcifications are noted in the descending thoracic aorta as well as the left main, left anterior descending, left circumflex and right coronary arteries. Hepatobiliary: Liver has a shrunken appearance and nodular contour, indicative of underlying cirrhosis. No suspicious cystic or solid hepatic lesions. No intra or extrahepatic biliary ductal dilatation. Gallbladder is unremarkable in appearance. Pancreas: No pancreatic mass. No pancreatic ductal dilatation. Trace amount of fluid adjacent to the tail of the pancreas. No well organized pancreatic or peripancreatic fluid collections. Spleen: Unremarkable. Adrenals/Urinary Tract: Mild bilateral perinephric stranding (nonspecific). 1 cm low-attenuation lesion in the interpolar region of the right kidney compatible with a simple (Bosniak class 1) cysts, which  requires no imaging follow-up. No aggressive appearing renal lesions. No hydroureteronephrosis. Urinary bladder is distended, but otherwise unremarkable in appearance. Bilateral adrenal glands are normal in appearance. Stomach/Bowel: The appearance of the stomach is normal. There is no pathologic dilatation of small bowel or colon. Numerous colonic diverticuli are noted, without surrounding inflammatory changes to indicate an acute diverticulitis at this time. The appendix is not confidently identified and may be surgically absent. Regardless, there are no inflammatory changes noted adjacent to the cecum to suggest the presence of an acute appendicitis at this time. Vascular/Lymphatic: Atherosclerosis throughout the abdominal aorta and pelvic vasculature, without evidence of aneurysm or dissection. No lymphadenopathy noted  in the abdomen or pelvis. Reproductive: Prostate gland and seminal vesicles are unremarkable in appearance. Other: No significant volume of ascites.  No pneumoperitoneum. Musculoskeletal: There are no aggressive appearing lytic or blastic lesions noted in the visualized portions of the skeleton. IMPRESSION: 1. Trace amount of fluid adjacent to the tail of the pancreas. This is nonspecific, but could indicate a mild acute interstitial pancreatitis. Correlation with serum lipase is recommended. 2. Cirrhosis. 3. Colonic diverticulosis without evidence of acute diverticulitis at this time. 4. Aortic atherosclerosis, in addition to left main and three-vessel coronary artery disease. Aortic Atherosclerosis (ICD10-I70.0). Electronically Signed   By: Trudie Reed M.D.   On: 10/14/2023 06:04   DG Chest Port 1 View Result Date: 10/13/2023 CLINICAL DATA:  Sepsis, febrile EXAM: PORTABLE CHEST 1 VIEW COMPARISON:  09/29/2023 FINDINGS: Single frontal view of the chest demonstrates a stable cardiac silhouette. Chronic elevation of the right hemidiaphragm. Left hemidiaphragm is excluded by collimation. No  airspace disease, effusion, or pneumothorax. No acute bony abnormalities. IMPRESSION: 1. Stable chest, no acute process. Electronically Signed   By: Sharlet Salina M.D.   On: 10/13/2023 20:30   DG Ankle 2 Views Left Result Date: 10/01/2023 CLINICAL DATA:  Left ankle pain.  Tender to touch.  Limited motion. EXAM: LEFT ANKLE - 2 VIEW COMPARISON:  Left foot radiographs 11/10/2003 FINDINGS: There is mildly decreased bone mineralization. There is a centrally lucent and peripherally sclerotic irregular lesion within the distal tibial diaphysis in a region measuring up to approximately 4.7 cm in craniocaudal dimension with serpiginous morphology suggesting a remote/chronic infarct. The ankle mortise is symmetric and intact. Minimal distal anterior tibial plafond degenerative spurring. Mild plantar calcaneal heel spur. Mild dorsal talonavicular degenerative osteophytosis. Mild pes planus. Moderate atherosclerotic calcifications. Mild calcific density suggestive of the fibula without regional lateral ankle soft tissue swelling, likely chronic. No acute fracture is seen.  No dislocation. IMPRESSION: 1. No acute fracture. 2. Mild plantar calcaneal heel spur. 3. Mild talonavicular osteoarthritis. 4. 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 Electronically Signed   By: Neita Garnet M.D.   On: 10/01/2023 12:53   ECHOCARDIOGRAM COMPLETE Result Date: 09/29/2023    ECHOCARDIOGRAM REPORT   Patient Name:   Don [name] Date of Exam: 09/29/2023 Medical Rec #:  [id number]       Height:       72.0 in Accession #:    [id number]      Weight:       179.9 lb Date of Birth:  03-Dec-1945       BSA:          2.037 m Patient Age:    77 years        BP:           117/86 mmHg Patient Gender: M               HR:           64 bpm. Exam Location:  Inpatient Procedure: 2D Echo, Cardiac Doppler and Color Doppler Indications:    Syncope R55  History:        Patient has prior history of Echocardiogram examinations, most                  recent 02/14/2021. Cardiomyopathy and CHF, CKD, stage 3,                 Signs/Symptoms:Chest Pain and Syncope; Risk  Factors:Hypertension.  Sonographer:    Lucendia Herrlich RCS Referring Phys: Heywood Iles GARDNER IMPRESSIONS  1. Left ventricular ejection fraction, by estimation, is 55 to 60%. The left ventricle has normal function. The left ventricle has no regional wall motion abnormalities. There is mild left ventricular hypertrophy. Left ventricular diastolic parameters are consistent with Grade I diastolic dysfunction (impaired relaxation).  2. Right ventricular systolic function is normal. The right ventricular size is normal. There is normal pulmonary artery systolic pressure.  3. The mitral valve is normal in structure. No evidence of mitral valve regurgitation. No evidence of mitral stenosis.  4. The tricuspid valve is abnormal.  5. The aortic valve is tricuspid. There is mild calcification of the aortic valve. There is mild thickening of the aortic valve. Aortic valve regurgitation is mild. No aortic stenosis is present.  6. Aortic dilatation noted. There is mild dilatation of the ascending aorta, measuring 38 mm.  7. The inferior vena cava is normal in size with greater than 50% respiratory variability, suggesting right atrial pressure of 3 mmHg. FINDINGS  Left Ventricle: Left ventricular ejection fraction, by estimation, is 55 to 60%. The left ventricle has normal function. The left ventricle has no regional wall motion abnormalities. The left ventricular internal cavity size was normal in size. There is  mild left ventricular hypertrophy. Left ventricular diastolic parameters are consistent with Grade I diastolic dysfunction (impaired relaxation). Right Ventricle: The right ventricular size is normal. Right vetricular wall thickness was not well visualized. Right ventricular systolic function is normal. There is normal pulmonary artery systolic pressure. The tricuspid regurgitant velocity is  1.93 m/s, and with an assumed right atrial pressure of 3 mmHg, the estimated right ventricular systolic pressure is 17.9 mmHg. Left Atrium: Left atrial size was normal in size. Right Atrium: Right atrial size was normal in size. Pericardium: There is no evidence of pericardial effusion. Mitral Valve: The mitral valve is normal in structure. No evidence of mitral valve regurgitation. No evidence of mitral valve stenosis. Tricuspid Valve: The tricuspid valve is abnormal. Tricuspid valve regurgitation is mild . No evidence of tricuspid stenosis. Aortic Valve: The aortic valve is tricuspid. There is mild calcification of the aortic valve. There is mild thickening of the aortic valve. There is mild aortic valve annular calcification. Aortic valve regurgitation is mild. No aortic stenosis is present. Aortic valve mean gradient measures 2.7 mmHg. Aortic valve peak gradient measures 5.9 mmHg. Aortic valve area, by VTI measures 2.58 cm. Pulmonic Valve: The pulmonic valve was not well visualized. Pulmonic valve regurgitation is trivial. No evidence of pulmonic stenosis. Aorta: The aortic root is normal in size and structure and aortic dilatation noted. There is mild dilatation of the ascending aorta, measuring 38 mm. Venous: The inferior vena cava is normal in size with greater than 50% respiratory variability, suggesting right atrial pressure of 3 mmHg. IAS/Shunts: No atrial level shunt detected by color flow Doppler.  LEFT VENTRICLE PLAX 2D LVIDd:         4.50 cm   Diastology LVIDs:         3.30 cm   LV e' medial:    3.37 cm/s LV PW:         1.20 cm   LV E/e' medial:  10.7 LV IVS:        1.30 cm   LV e' lateral:   3.70 cm/s LVOT diam:     2.20 cm   LV E/e' lateral: 9.8 LV SV:  61 LV SV Index:   30 LVOT Area:     3.80 cm  RIGHT VENTRICLE             IVC RV S prime:     12.60 cm/s  IVC diam: 1.10 cm TAPSE (M-mode): 1.0 cm LEFT ATRIUM           Index        RIGHT ATRIUM           Index LA diam:      4.00 cm 1.96 cm/m    RA Area:     11.40 cm LA Vol (A2C): 49.5 ml 24.30 ml/m  RA Volume:   21.60 ml  10.61 ml/m LA Vol (A4C): 47.1 ml 23.12 ml/m  AORTIC VALVE AV Area (Vmax):    2.31 cm AV Area (Vmean):   2.46 cm AV Area (VTI):     2.58 cm AV Vmax:           121.10 cm/s AV Vmean:          73.480 cm/s AV VTI:            0.238 m AV Peak Grad:      5.9 mmHg AV Mean Grad:      2.7 mmHg LVOT Vmax:         73.57 cm/s LVOT Vmean:        47.500 cm/s LVOT VTI:          0.162 m LVOT/AV VTI ratio: 0.68  AORTA Ao Root diam: 3.60 cm Ao Asc diam:  3.80 cm MITRAL VALVE               TRICUSPID VALVE MV Area (PHT): 2.50 cm    TR Peak grad:   14.9 mmHg MV Decel Time: 303 msec    TR Vmax:        193.00 cm/s MV E velocity: 36.20 cm/s MV A velocity: 69.20 cm/s  SHUNTS MV E/A ratio:  0.52        Systemic VTI:  0.16 m                            Systemic Diam: 2.20 cm Dina Rich MD Electronically signed by Dina Rich MD Signature Date/Time: 09/29/2023/5:20:35 PM    Final    EEG adult Result Date: 09/29/2023 Charlsie Quest, MD     09/29/2023  7:19 AM Patient Name: Don [name] MRN: [id number] Epilepsy Attending: Charlsie Quest Referring Physician/Provider: Hillary Bow, DO Date: 09/29/2023 Duration: 23.04 mins Patient history: 78yo M with ams getting eeg to evaluate for seizure Level of alertness: Awake, asleep AEDs during EEG study: None Technical aspects: This EEG study was done with scalp electrodes positioned according to the 10-20 International system of electrode placement. Electrical activity was reviewed with band pass filter of 1-70Hz , sensitivity of 7 uV/mm, display speed of 40mm/sec with a 60Hz  notched filter applied as appropriate. EEG data were recorded continuously and digitally stored.  Video monitoring was available and reviewed as appropriate. Description: The posterior dominant rhythm consists of 8-9  Hz activity of moderate voltage (25-35 uV) seen predominantly in posterior head regions, symmetric and reactive to eye  opening and eye closing. Sleep was characterized by vertex waves, sleep spindles (12 to 14 Hz), maximal frontocentral region. Hyperventilation and photic stimulation were not performed.   IMPRESSION: This study is within normal limits. No seizures or epileptiform discharges were seen throughout the recording.  A normal interictal EEG does not exclude the diagnosis of epilepsy. Charlsie Quest   DG Chest Port 1 View Result Date: 09/29/2023 CLINICAL DATA:  Altered mental status EXAM: PORTABLE CHEST 1 VIEW COMPARISON:  09/14/2023 FINDINGS: The heart size and mediastinal contours are within normal limits. Both lungs are clear. The visualized skeletal structures are unremarkable. IMPRESSION: No active disease. Electronically Signed   By: Alcide Clever M.D.   On: 09/29/2023 01:53   CT HEAD WO CONTRAST Result Date: 09/29/2023 CLINICAL DATA:  Delirium EXAM: CT HEAD WITHOUT CONTRAST TECHNIQUE: Contiguous axial images were obtained from the base of the skull through the vertex without intravenous contrast. RADIATION DOSE REDUCTION: This exam was performed according to the departmental dose-optimization program which includes automated exposure control, adjustment of the mA and/or kV according to patient size and/or use of iterative reconstruction technique. COMPARISON:  CT head 09/14/2023. FINDINGS: Brain: No evidence of acute infarction, hemorrhage, hydrocephalus, extra-axial collection or mass lesion/mass effect. Vascular: No hyperdense vessel identified. Skull: No acute fracture. Sinuses/Orbits: Remote left medial orbital wall fracture. No acute orbital findings. Clear sinuses. Other: No mastoid effusions. IMPRESSION: Stable head CT.  No evidence of acute intracranial abnormality. Electronically Signed   By: Feliberto Harts M.D.   On: 09/29/2023 00:50     Subjective: Pt reports that he is feeling better, he has been eating and drinking well.  No specific complaints.   Discharge Exam: Vitals:   10/16/23 0320  10/16/23 0848  BP: (!) 119/52   Pulse: 72   Resp: 20   Temp: 98.7 F (37.1 C)   SpO2: 99% 99%   Vitals:   10/15/23 1324 10/15/23 1949 10/16/23 0320 10/16/23 0848  BP: (!) 108/57 125/64 (!) 119/52   Pulse: (!) 103 66 72   Resp: 19 20 20    Temp: (!) 97.5 F (36.4 C) 97.8 F (36.6 C) 98.7 F (37.1 C)   TempSrc: Oral Oral Oral   SpO2: 100% 100% 99% 99%  Weight:      Height:       General exam: frail, elderly male, sitting up in bed, eating, Appears calm and comfortable  Respiratory system: Clear to auscultation. Respiratory effort normal. Cardiovascular system: normal S1 & S2 heard. No JVD, murmurs, rubs, gallops or clicks. No pedal edema. Gastrointestinal system: Abdomen is nondistended, soft and nontender. No organomegaly or masses felt. Normal bowel sounds heard. Central nervous system: Alert and oriented. No focal neurological deficits. Extremities: Symmetric 5 x 5 power. Skin: No rashes, lesions or ulcers. Psychiatry: Judgement and insight appear normal. Mood & affect appropriate   The results of significant diagnostics from this hospitalization (including imaging, microbiology, ancillary and laboratory) are listed below for reference.     Microbiology: Recent Results (from the past 240 hours)  Resp panel by RT-PCR (RSV, Flu A&B, Covid) Anterior Nasal Swab     Status: None   Collection Time: 10/13/23  8:10 PM   Specimen: Anterior Nasal Swab  Result Value Ref Range Status   SARS Coronavirus 2 by RT PCR NEGATIVE NEGATIVE Final    Comment: (NOTE) SARS-CoV-2 target nucleic acids are NOT DETECTED.  The SARS-CoV-2 RNA is generally detectable in upper respiratory specimens during the acute phase of infection. The lowest concentration of SARS-CoV-2 viral copies this assay can detect is 138 copies/mL. A negative result does not preclude SARS-Cov-2 infection and should not be used as the sole basis for treatment or other patient management decisions. A negative result may  occur with  improper  specimen collection/handling, submission of specimen other than nasopharyngeal swab, presence of viral mutation(s) within the areas targeted by this assay, and inadequate number of viral copies(<138 copies/mL). A negative result must be combined with clinical observations, patient history, and epidemiological information. The expected result is Negative.  Fact Sheet for Patients:  BloggerCourse.com  Fact Sheet for Healthcare Providers:  SeriousBroker.it  This test is no t yet approved or cleared by the [place] FDA and  has been authorized for detection and/or diagnosis of SARS-CoV-2 by FDA under an Emergency Use Authorization (EUA). This EUA will remain  in effect (meaning this test can be used) for the duration of the COVID-19 declaration under Section 564(b)(1) of the Act, 21 U.S.C.section 360bbb-3(b)(1), unless the authorization is terminated  or revoked sooner.       Influenza A by PCR NEGATIVE NEGATIVE Final   Influenza B by PCR NEGATIVE NEGATIVE Final    Comment: (NOTE) The Xpert Xpress SARS-CoV-2/FLU/RSV plus assay is intended as an aid in the diagnosis of influenza from Nasopharyngeal swab specimens and should not be used as a sole basis for treatment. Nasal washings and aspirates are unacceptable for Xpert Xpress SARS-CoV-2/FLU/RSV testing.  Fact Sheet for Patients: BloggerCourse.com  Fact Sheet for Healthcare Providers: SeriousBroker.it  This test is not yet approved or cleared by the [place] FDA and has been authorized for detection and/or diagnosis of SARS-CoV-2 by FDA under an Emergency Use Authorization (EUA). This EUA will remain in effect (meaning this test can be used) for the duration of the COVID-19 declaration under Section 564(b)(1) of the Act, 21 U.S.C. section 360bbb-3(b)(1), unless the authorization is terminated  or revoked.     Resp Syncytial Virus by PCR NEGATIVE NEGATIVE Final    Comment: (NOTE) Fact Sheet for Patients: BloggerCourse.com  Fact Sheet for Healthcare Providers: SeriousBroker.it  This test is not yet approved or cleared by the [place] FDA and has been authorized for detection and/or diagnosis of SARS-CoV-2 by FDA under an Emergency Use Authorization (EUA). This EUA will remain in effect (meaning this test can be used) for the duration of the COVID-19 declaration under Section 564(b)(1) of the Act, 21 U.S.C. section 360bbb-3(b)(1), unless the authorization is terminated or revoked.  Performed at [facility], [address], [place], [place] [address]   Blood Culture (routine x 2)     Status: None (Preliminary result)   Collection Time: 10/13/23  8:19 PM   Specimen: BLOOD  Result Value Ref Range Status   Specimen Description BLOOD BLOOD RIGHT ARM forearm  Final   Special Requests NONE  Final   Culture   Final    NO GROWTH 3 DAYS Performed at [facility], [address], [place], [place] [address]    Report Status PENDING  Incomplete  Blood Culture (routine x 2)     Status: None (Preliminary result)   Collection Time: 10/13/23  8:40 PM   Specimen: BLOOD  Result Value Ref Range Status   Specimen Description BLOOD BLOOD RIGHT ARM Antecubital  Final   Special Requests NONE  Final   Culture   Final    NO GROWTH 3 DAYS Performed at [facility], [address] [place], [place] [address]    Report Status PENDING  Incomplete  Respiratory (~20 pathogens) panel by PCR     Status: None   Collection Time: 10/14/23  2:33 AM   Specimen: Nasopharyngeal Swab; Respiratory  Result Value Ref Range Status   Adenovirus NOT DETECTED NOT DETECTED Final  Coronavirus 229E NOT DETECTED NOT DETECTED Final    Comment: (NOTE) The Coronavirus on the Respiratory Panel, DOES NOT test for the novel  Coronavirus (2019 nCoV)     Coronavirus HKU1 NOT DETECTED NOT DETECTED Final   Coronavirus NL63 NOT DETECTED NOT DETECTED Final   Coronavirus OC43 NOT DETECTED NOT DETECTED Final   Metapneumovirus NOT DETECTED NOT DETECTED Final   Rhinovirus / Enterovirus NOT DETECTED NOT DETECTED Final   Influenza A NOT DETECTED NOT DETECTED Final   Influenza B NOT DETECTED NOT DETECTED Final   Parainfluenza Virus 1 NOT DETECTED NOT DETECTED Final   Parainfluenza Virus 2 NOT DETECTED NOT DETECTED Final   Parainfluenza Virus 3 NOT DETECTED NOT DETECTED Final   Parainfluenza Virus 4 NOT DETECTED NOT DETECTED Final   Respiratory Syncytial Virus NOT DETECTED NOT DETECTED Final   Bordetella pertussis NOT DETECTED NOT DETECTED Final   Bordetella Parapertussis NOT DETECTED NOT DETECTED Final   Chlamydophila pneumoniae NOT DETECTED NOT DETECTED Final   Mycoplasma pneumoniae NOT DETECTED NOT DETECTED Final    Comment: Performed at [facility], [address], [place], [place] [address]  MRSA Next Gen by PCR, Nasal     Status: None   Collection Time: 10/14/23  2:24 PM   Specimen: Nasal Mucosa; Nasal Swab  Result Value Ref Range Status   MRSA by PCR Next Gen NOT DETECTED NOT DETECTED Final    Comment: (NOTE) The GeneXpert MRSA Assay (FDA approved for NASAL specimens only), is one component of a comprehensive MRSA colonization surveillance program. It is not intended to diagnose MRSA infection nor to guide or monitor treatment for MRSA infections. Test performance is not FDA approved in patients less than 53 years old. Performed at [facility], [address], [place], [place] [address]      Labs: BNP (last 3 results) Recent Labs    08/15/23 1105  BNP 129.0*   Basic Metabolic Panel: Recent Labs  Lab 10/13/23 2019 10/14/23 0636 10/15/23 0520 10/16/23 0358  NA 131* 134* 134* 137  K 3.9 3.7 3.1* 3.4*  CL 100 101 102 108  CO2 26 23 24 23   GLUCOSE 104* 90 107* 85  BUN 18 20 17 12   CREATININE 1.32* 1.06 0.91 0.88   CALCIUM 8.2* 8.0* 8.2* 8.2*  MG  --  1.7  --   --   PHOS  --  3.7  --   --    Liver Function Tests: Recent Labs  Lab 10/13/23 2019 10/14/23 0636 10/15/23 0520 10/16/23 0358  AST 78* 53* 33 30  ALT 71* 58* 38 36  ALKPHOS 245* 206* 133* 115  BILITOT 1.0 0.7 0.5 0.5  PROT 5.6* 5.6* 5.1* 5.1*  ALBUMIN 1.7* 2.1* 2.2* 2.2*   Recent Labs  Lab 10/14/23 0700  LIPASE 41   Recent Labs  Lab 10/13/23 2115  AMMONIA 26   CBC: Recent Labs  Lab 10/13/23 2019 10/14/23 0636 10/15/23 0520  WBC 5.2 3.9* 4.6  NEUTROABS 4.3  --   --   HGB 8.1* 8.3* 7.9*  HCT 24.6* 25.6* 24.1*  MCV 93.2 95.5 93.4  PLT 209 203 205   Cardiac Enzymes: No results for input(s): "CKTOTAL", "CKMB", "CKMBINDEX", "TROPONINI" in the last 168 hours. BNP: Invalid input(s): "POCBNP" CBG: No results for input(s): "GLUCAP" in the last 168 hours. D-Dimer No results for input(s): "DDIMER" in the last 72 hours. Hgb A1c No results for input(s): "HGBA1C" in the last 72 hours. Lipid Profile No results for  input(s): "CHOL", "HDL", "LDLCALC", "TRIG", "CHOLHDL", "LDLDIRECT" in the last 72 hours. Thyroid function studies Recent Labs    10/14/23 0636  TSH 3.121   Anemia work up Recent Labs    10/14/23 0636 10/14/23 0706  VITAMINB12  --  1,427*  FOLATE 14.8  --   FERRITIN 325  --   TIBC 151*  --   IRON 18*  --   RETICCTPCT 1.3  --    Urinalysis    Component Value Date/Time   COLORURINE AMBER (A) 10/13/2023 2019   APPEARANCEUR CLEAR 10/13/2023 2019   LABSPEC 1.016 10/13/2023 2019   PHURINE 5.0 10/13/2023 2019   GLUCOSEU NEGATIVE 10/13/2023 2019   HGBUR MODERATE (A) 10/13/2023 2019   BILIRUBINUR NEGATIVE 10/13/2023 2019   KETONESUR NEGATIVE 10/13/2023 2019   PROTEINUR NEGATIVE 10/13/2023 2019   UROBILINOGEN 1.0 02/01/2012 1300   NITRITE NEGATIVE 10/13/2023 2019   LEUKOCYTESUR NEGATIVE 10/13/2023 2019   Sepsis Labs Recent Labs  Lab 10/13/23 2019 10/14/23 0636 10/15/23 0520  WBC 5.2 3.9* 4.6    Microbiology Recent Results (from the past 240 hours)  Resp panel by RT-PCR (RSV, Flu A&B, Covid) Anterior Nasal Swab     Status: None   Collection Time: 10/13/23  8:10 PM   Specimen: Anterior Nasal Swab  Result Value Ref Range Status   SARS Coronavirus 2 by RT PCR NEGATIVE NEGATIVE Final    Comment: (NOTE) SARS-CoV-2 target nucleic acids are NOT DETECTED.  The SARS-CoV-2 RNA is generally detectable in upper respiratory specimens during the acute phase of infection. The lowest concentration of SARS-CoV-2 viral copies this assay can detect is 138 copies/mL. A negative result does not preclude SARS-Cov-2 infection and should not be used as the sole basis for treatment or other patient management decisions. A negative result may occur with  improper specimen collection/handling, submission of specimen other than nasopharyngeal swab, presence of viral mutation(s) within the areas targeted by this assay, and inadequate number of viral copies(<138 copies/mL). A negative result must be combined with clinical observations, patient history, and epidemiological information. The expected result is Negative.  Fact Sheet for Patients:  BloggerCourse.com  Fact Sheet for Healthcare Providers:  SeriousBroker.it  This test is no t yet approved or cleared by the [place] FDA and  has been authorized for detection and/or diagnosis of SARS-CoV-2 by FDA under an Emergency Use Authorization (EUA). This EUA will remain  in effect (meaning this test can be used) for the duration of the COVID-19 declaration under Section 564(b)(1) of the Act, 21 U.S.C.section 360bbb-3(b)(1), unless the authorization is terminated  or revoked sooner.       Influenza A by PCR NEGATIVE NEGATIVE Final   Influenza B by PCR NEGATIVE NEGATIVE Final    Comment: (NOTE) The Xpert Xpress SARS-CoV-2/FLU/RSV plus assay is intended as an aid in the diagnosis of influenza  from Nasopharyngeal swab specimens and should not be used as a sole basis for treatment. Nasal washings and aspirates are unacceptable for Xpert Xpress SARS-CoV-2/FLU/RSV testing.  Fact Sheet for Patients: BloggerCourse.com  Fact Sheet for Healthcare Providers: SeriousBroker.it  This test is not yet approved or cleared by the [place] FDA and has been authorized for detection and/or diagnosis of SARS-CoV-2 by FDA under an Emergency Use Authorization (EUA). This EUA will remain in effect (meaning this test can be used) for the duration of the COVID-19 declaration under Section 564(b)(1) of the Act, 21 U.S.C. section 360bbb-3(b)(1), unless the authorization is terminated or revoked.  Resp Syncytial Virus by PCR NEGATIVE NEGATIVE Final    Comment: (NOTE) Fact Sheet for Patients: BloggerCourse.com  Fact Sheet for Healthcare Providers: SeriousBroker.it  This test is not yet approved or cleared by the [place] FDA and has been authorized for detection and/or diagnosis of SARS-CoV-2 by FDA under an Emergency Use Authorization (EUA). This EUA will remain in effect (meaning this test can be used) for the duration of the COVID-19 declaration under Section 564(b)(1) of the Act, 21 U.S.C. section 360bbb-3(b)(1), unless the authorization is terminated or revoked.  Performed at [facility], [address], [place], [place] [address]   Blood Culture (routine x 2)     Status: None (Preliminary result)   Collection Time: 10/13/23  8:19 PM   Specimen: BLOOD  Result Value Ref Range Status   Specimen Description BLOOD BLOOD RIGHT ARM forearm  Final   Special Requests NONE  Final   Culture   Final    NO GROWTH 3 DAYS Performed at [facility], [address] [place], [place] [address]    Report Status PENDING  Incomplete  Blood Culture (routine x 2)     Status: None (Preliminary  result)   Collection Time: 10/13/23  8:40 PM   Specimen: BLOOD  Result Value Ref Range Status   Specimen Description BLOOD BLOOD RIGHT ARM Antecubital  Final   Special Requests NONE  Final   Culture   Final    NO GROWTH 3 DAYS Performed at [facility], [address] [place], [place] [address]    Report Status PENDING  Incomplete  Respiratory (~20 pathogens) panel by PCR     Status: None   Collection Time: 10/14/23  2:33 AM   Specimen: Nasopharyngeal Swab; Respiratory  Result Value Ref Range Status   Adenovirus NOT DETECTED NOT DETECTED Final   Coronavirus 229E NOT DETECTED NOT DETECTED Final    Comment: (NOTE) The Coronavirus on the Respiratory Panel, DOES NOT test for the novel  Coronavirus (2019 nCoV)    Coronavirus HKU1 NOT DETECTED NOT DETECTED Final   Coronavirus NL63 NOT DETECTED NOT DETECTED Final   Coronavirus OC43 NOT DETECTED NOT DETECTED Final   Metapneumovirus NOT DETECTED NOT DETECTED Final   Rhinovirus / Enterovirus NOT DETECTED NOT DETECTED Final   Influenza A NOT DETECTED NOT DETECTED Final   Influenza B NOT DETECTED NOT DETECTED Final   Parainfluenza Virus 1 NOT DETECTED NOT DETECTED Final   Parainfluenza Virus 2 NOT DETECTED NOT DETECTED Final   Parainfluenza Virus 3 NOT DETECTED NOT DETECTED Final   Parainfluenza Virus 4 NOT DETECTED NOT DETECTED Final   Respiratory Syncytial Virus NOT DETECTED NOT DETECTED Final   Bordetella pertussis NOT DETECTED NOT DETECTED Final   Bordetella Parapertussis NOT DETECTED NOT DETECTED Final   Chlamydophila pneumoniae NOT DETECTED NOT DETECTED Final   Mycoplasma pneumoniae NOT DETECTED NOT DETECTED Final    Comment: Performed at [facility], [address] [place], [place] [address]  MRSA Next Gen by PCR, Nasal     Status: None   Collection Time: 10/14/23  2:24 PM   Specimen: Nasal Mucosa; Nasal Swab  Result Value Ref Range Status   MRSA by PCR Next Gen NOT DETECTED NOT DETECTED Final    Comment: (NOTE) The  GeneXpert MRSA Assay (FDA approved for NASAL specimens only), is one component of a comprehensive MRSA colonization surveillance program. It is not intended to diagnose MRSA infection nor to guide or monitor treatment for MRSA infections. Test performance  is not FDA approved in patients less than 75 years old. Performed at [facility], [address] [place], [place] [address]     Time coordinating discharge: 41 mins  SIGNED:  Standley Dakins, MD  [facility] 10/16/2023, 11:54 AM How to contact the [facility] Attending or Consulting provider 7A - 7P or covering provider during after hours 7P -7A, for this patient?  Check the care team in [facility] and look for a) attending/consulting [facility] provider listed and b) the [facility] team listed Log into [email or web address] and use [place] universal password to access. If you do not have the password, please contact the hospital operator. Locate the [facility] provider you are looking for under [facility] and page to a number that you can be directly reached. If you still have difficulty reaching the provider, please page the [facility] (Director on Call) for the Hospitalists listed on amion for assistance.

## 2023-10-16 NOTE — Plan of Care (Signed)

## 2023-10-18 LAB — CULTURE, BLOOD (ROUTINE X 2)
Culture: NO GROWTH
Culture: NO GROWTH

## 2023-10-28 ENCOUNTER — Emergency Department (HOSPITAL_COMMUNITY): Payer: 59

## 2023-10-28 ENCOUNTER — Other Ambulatory Visit: Payer: Self-pay

## 2023-10-28 ENCOUNTER — Encounter (HOSPITAL_COMMUNITY): Payer: Self-pay | Admitting: Emergency Medicine

## 2023-10-28 ENCOUNTER — Inpatient Hospital Stay (HOSPITAL_COMMUNITY)
Admission: EM | Admit: 2023-10-28 | Discharge: 2023-11-09 | DRG: 280 | Disposition: A | Discharge status: Skilled Nursing Facility | Payer: 59 | Source: Skilled Nursing Facility | Attending: Family Medicine | Admitting: Family Medicine

## 2023-10-28 DIAGNOSIS — Z6822 Body mass index (BMI) 22.0-22.9, adult: Secondary | ICD-10-CM

## 2023-10-28 DIAGNOSIS — M25521 Pain in right elbow: Secondary | ICD-10-CM | POA: Diagnosis present

## 2023-10-28 DIAGNOSIS — I252 Old myocardial infarction: Secondary | ICD-10-CM | POA: Diagnosis not present

## 2023-10-28 DIAGNOSIS — R54 Age-related physical debility: Secondary | ICD-10-CM | POA: Diagnosis present

## 2023-10-28 DIAGNOSIS — L03113 Cellulitis of right upper limb: Secondary | ICD-10-CM | POA: Diagnosis not present

## 2023-10-28 DIAGNOSIS — D509 Iron deficiency anemia, unspecified: Secondary | ICD-10-CM | POA: Diagnosis present

## 2023-10-28 DIAGNOSIS — R008 Other abnormalities of heart beat: Secondary | ICD-10-CM | POA: Diagnosis present

## 2023-10-28 DIAGNOSIS — I2510 Atherosclerotic heart disease of native coronary artery without angina pectoris: Secondary | ICD-10-CM | POA: Diagnosis present

## 2023-10-28 DIAGNOSIS — E785 Hyperlipidemia, unspecified: Secondary | ICD-10-CM | POA: Diagnosis not present

## 2023-10-28 DIAGNOSIS — I4719 Other supraventricular tachycardia: Secondary | ICD-10-CM | POA: Diagnosis present

## 2023-10-28 DIAGNOSIS — E0781 Sick-euthyroid syndrome: Secondary | ICD-10-CM | POA: Diagnosis present

## 2023-10-28 DIAGNOSIS — E876 Hypokalemia: Secondary | ICD-10-CM | POA: Diagnosis present

## 2023-10-28 DIAGNOSIS — E8809 Other disorders of plasma-protein metabolism, not elsewhere classified: Secondary | ICD-10-CM | POA: Diagnosis present

## 2023-10-28 DIAGNOSIS — I5020 Unspecified systolic (congestive) heart failure: Secondary | ICD-10-CM | POA: Diagnosis present

## 2023-10-28 DIAGNOSIS — K5900 Constipation, unspecified: Secondary | ICD-10-CM | POA: Diagnosis present

## 2023-10-28 DIAGNOSIS — I493 Ventricular premature depolarization: Secondary | ICD-10-CM | POA: Diagnosis present

## 2023-10-28 DIAGNOSIS — I700 Atherosclerosis of aorta: Secondary | ICD-10-CM | POA: Diagnosis present

## 2023-10-28 DIAGNOSIS — I409 Acute myocarditis, unspecified: Secondary | ICD-10-CM | POA: Diagnosis present

## 2023-10-28 DIAGNOSIS — Z515 Encounter for palliative care: Secondary | ICD-10-CM

## 2023-10-28 DIAGNOSIS — I5032 Chronic diastolic (congestive) heart failure: Secondary | ICD-10-CM | POA: Diagnosis present

## 2023-10-28 DIAGNOSIS — Z880 Allergy status to penicillin: Secondary | ICD-10-CM

## 2023-10-28 DIAGNOSIS — E782 Mixed hyperlipidemia: Secondary | ICD-10-CM | POA: Diagnosis present

## 2023-10-28 DIAGNOSIS — I5023 Acute on chronic systolic (congestive) heart failure: Secondary | ICD-10-CM | POA: Diagnosis not present

## 2023-10-28 DIAGNOSIS — M109 Gout, unspecified: Secondary | ICD-10-CM | POA: Diagnosis present

## 2023-10-28 DIAGNOSIS — R059 Cough, unspecified: Secondary | ICD-10-CM | POA: Diagnosis not present

## 2023-10-28 DIAGNOSIS — M79604 Pain in right leg: Secondary | ICD-10-CM | POA: Diagnosis present

## 2023-10-28 DIAGNOSIS — K219 Gastro-esophageal reflux disease without esophagitis: Secondary | ICD-10-CM | POA: Diagnosis present

## 2023-10-28 DIAGNOSIS — D849 Immunodeficiency, unspecified: Secondary | ICD-10-CM | POA: Diagnosis present

## 2023-10-28 DIAGNOSIS — Z7189 Other specified counseling: Secondary | ICD-10-CM | POA: Diagnosis not present

## 2023-10-28 DIAGNOSIS — R7989 Other specified abnormal findings of blood chemistry: Secondary | ICD-10-CM | POA: Diagnosis present

## 2023-10-28 DIAGNOSIS — I110 Hypertensive heart disease with heart failure: Secondary | ICD-10-CM | POA: Diagnosis present

## 2023-10-28 DIAGNOSIS — K7460 Unspecified cirrhosis of liver: Secondary | ICD-10-CM | POA: Diagnosis present

## 2023-10-28 DIAGNOSIS — M79605 Pain in left leg: Secondary | ICD-10-CM | POA: Diagnosis present

## 2023-10-28 DIAGNOSIS — I214 Non-ST elevation (NSTEMI) myocardial infarction: Principal | ICD-10-CM | POA: Insufficient documentation

## 2023-10-28 DIAGNOSIS — I272 Pulmonary hypertension, unspecified: Secondary | ICD-10-CM | POA: Diagnosis present

## 2023-10-28 DIAGNOSIS — I3139 Other pericardial effusion (noninflammatory): Secondary | ICD-10-CM | POA: Diagnosis present

## 2023-10-28 DIAGNOSIS — Z8249 Family history of ischemic heart disease and other diseases of the circulatory system: Secondary | ICD-10-CM

## 2023-10-28 DIAGNOSIS — Z79899 Other long term (current) drug therapy: Secondary | ICD-10-CM

## 2023-10-28 DIAGNOSIS — I400 Infective myocarditis: Secondary | ICD-10-CM | POA: Diagnosis not present

## 2023-10-28 DIAGNOSIS — Z7982 Long term (current) use of aspirin: Secondary | ICD-10-CM

## 2023-10-28 DIAGNOSIS — Z87891 Personal history of nicotine dependence: Secondary | ICD-10-CM

## 2023-10-28 DIAGNOSIS — I471 Supraventricular tachycardia, unspecified: Secondary | ICD-10-CM | POA: Diagnosis present

## 2023-10-28 DIAGNOSIS — B974 Respiratory syncytial virus as the cause of diseases classified elsewhere: Secondary | ICD-10-CM | POA: Diagnosis not present

## 2023-10-28 DIAGNOSIS — R509 Fever, unspecified: Secondary | ICD-10-CM

## 2023-10-28 DIAGNOSIS — M1000 Idiopathic gout, unspecified site: Secondary | ICD-10-CM

## 2023-10-28 DIAGNOSIS — R627 Adult failure to thrive: Secondary | ICD-10-CM | POA: Diagnosis present

## 2023-10-28 DIAGNOSIS — R7982 Elevated C-reactive protein (CRP): Secondary | ICD-10-CM | POA: Diagnosis present

## 2023-10-28 DIAGNOSIS — Z1152 Encounter for screening for COVID-19: Secondary | ICD-10-CM

## 2023-10-28 DIAGNOSIS — Z66 Do not resuscitate: Secondary | ICD-10-CM | POA: Diagnosis present

## 2023-10-28 DIAGNOSIS — R079 Chest pain, unspecified: Secondary | ICD-10-CM

## 2023-10-28 DIAGNOSIS — R262 Difficulty in walking, not elsewhere classified: Secondary | ICD-10-CM | POA: Diagnosis present

## 2023-10-28 DIAGNOSIS — Z8619 Personal history of other infectious and parasitic diseases: Secondary | ICD-10-CM

## 2023-10-28 DIAGNOSIS — R700 Elevated erythrocyte sedimentation rate: Secondary | ICD-10-CM | POA: Diagnosis present

## 2023-10-28 DIAGNOSIS — I501 Left ventricular failure: Secondary | ICD-10-CM | POA: Diagnosis not present

## 2023-10-28 DIAGNOSIS — Z7984 Long term (current) use of oral hypoglycemic drugs: Secondary | ICD-10-CM

## 2023-10-28 LAB — RESP PANEL BY RT-PCR (RSV, FLU A&B, COVID)  RVPGX2
Influenza A by PCR: NEGATIVE
Influenza B by PCR: NEGATIVE
Resp Syncytial Virus by PCR: NEGATIVE
SARS Coronavirus 2 by RT PCR: NEGATIVE

## 2023-10-28 LAB — URINALYSIS, W/ REFLEX TO CULTURE (INFECTION SUSPECTED)
Bacteria, UA: NONE SEEN
Bilirubin Urine: NEGATIVE
Glucose, UA: NEGATIVE mg/dL
Hgb urine dipstick: NEGATIVE
Ketones, ur: NEGATIVE mg/dL
Leukocytes,Ua: NEGATIVE
Nitrite: NEGATIVE
Protein, ur: NEGATIVE mg/dL
Specific Gravity, Urine: 1.018 (ref 1.005–1.030)
pH: 6 (ref 5.0–8.0)

## 2023-10-28 LAB — CBC WITH DIFFERENTIAL/PLATELET
Abs Immature Granulocytes: 0.04 10*3/uL (ref 0.00–0.07)
Basophils Absolute: 0 10*3/uL (ref 0.0–0.1)
Basophils Relative: 0 %
Eosinophils Absolute: 0.2 10*3/uL (ref 0.0–0.5)
Eosinophils Relative: 2 %
HCT: 25.8 % — ABNORMAL LOW (ref 39.0–52.0)
Hemoglobin: 8.3 g/dL — ABNORMAL LOW (ref 13.0–17.0)
Immature Granulocytes: 0 %
Lymphocytes Relative: 11 %
Lymphs Abs: 1.1 10*3/uL (ref 0.7–4.0)
MCH: 30.5 pg (ref 26.0–34.0)
MCHC: 32.2 g/dL (ref 30.0–36.0)
MCV: 94.9 fL (ref 80.0–100.0)
Monocytes Absolute: 0.8 10*3/uL (ref 0.1–1.0)
Monocytes Relative: 8 %
Neutro Abs: 7.6 10*3/uL (ref 1.7–7.7)
Neutrophils Relative %: 79 %
Platelets: 381 10*3/uL (ref 150–400)
RBC: 2.72 MIL/uL — ABNORMAL LOW (ref 4.22–5.81)
RDW: 16.1 % — ABNORMAL HIGH (ref 11.5–15.5)
WBC: 9.7 10*3/uL (ref 4.0–10.5)
nRBC: 0 % (ref 0.0–0.2)

## 2023-10-28 LAB — TROPONIN I (HIGH SENSITIVITY)
Troponin I (High Sensitivity): 77 ng/L — ABNORMAL HIGH (ref <18)
Troponin I (High Sensitivity): 597 ng/L (ref <18)

## 2023-10-28 LAB — COMPREHENSIVE METABOLIC PANEL
ALT: 24 U/L (ref 0–44)
AST: 33 U/L (ref 15–41)
Albumin: 2.3 g/dL — ABNORMAL LOW (ref 3.5–5.0)
Alkaline Phosphatase: 122 U/L (ref 38–126)
Anion gap: 9 (ref 5–15)
BUN: 12 mg/dL (ref 8–23)
CO2: 26 mmol/L (ref 22–32)
Calcium: 8.7 mg/dL — ABNORMAL LOW (ref 8.9–10.3)
Chloride: 101 mmol/L (ref 98–111)
Creatinine, Ser: 0.9 mg/dL (ref 0.61–1.24)
GFR, Estimated: >60 mL/min (ref >60)
Glucose, Bld: 98 mg/dL (ref 70–99)
Potassium: 3.7 mmol/L (ref 3.5–5.1)
Sodium: 136 mmol/L (ref 135–145)
Total Bilirubin: 0.7 mg/dL (ref 0.0–1.2)
Total Protein: 6.1 g/dL — ABNORMAL LOW (ref 6.5–8.1)

## 2023-10-28 LAB — LACTIC ACID, PLASMA
Lactic Acid, Venous: 1.2 mmol/L (ref 0.5–1.9)
Lactic Acid, Venous: 1 mmol/L (ref 0.5–1.9)

## 2023-10-28 LAB — APTT: aPTT: 28 s (ref 24–36)

## 2023-10-28 LAB — PROTIME-INR
INR: 1.3 — ABNORMAL HIGH (ref 0.8–1.2)
Prothrombin Time: 16.1 s — ABNORMAL HIGH (ref 11.4–15.2)

## 2023-10-28 MED ORDER — SODIUM CHLORIDE 0.9 % IV SOLN: INTRAVENOUS | Status: DC

## 2023-10-28 MED ORDER — MAGNESIUM HYDROXIDE 400 MG/5ML PO SUSP: 30.0000 mL | Freq: Every day | ORAL | Status: DC | PRN

## 2023-10-28 MED ORDER — MIDODRINE HCL 5 MG PO TABS: 5.0000 mg | ORAL_TABLET | Freq: Three times a day (TID) | ORAL | Status: DC

## 2023-10-28 MED ORDER — ONDANSETRON HCL 4 MG PO TABS: 4.0000 mg | ORAL_TABLET | Freq: Four times a day (QID) | ORAL | Status: DC | PRN

## 2023-10-28 MED ORDER — PANTOPRAZOLE SODIUM 40 MG PO TBEC
40.0000 mg | DELAYED_RELEASE_TABLET | Freq: Every day | ORAL | Status: DC
Start: 2023-10-29 — End: 2023-11-10
  Administered 2023-10-29 – 2023-11-09 (×12): 40 mg via ORAL
  Filled 2023-10-28 (×12): qty 1

## 2023-10-28 MED ORDER — FERROUS SULFATE 325 (65 FE) MG PO TABS
325.0000 mg | ORAL_TABLET | Freq: Every day | ORAL | Status: DC
  Administered 2023-10-29 – 2023-11-09 (×12): 325 mg via ORAL
  Filled 2023-10-28 (×12): qty 1

## 2023-10-28 MED ORDER — ASPIRIN 81 MG PO TBEC
81.0000 mg | DELAYED_RELEASE_TABLET | Freq: Every day | ORAL | Status: DC
  Administered 2023-10-29 – 2023-11-09 (×12): 81 mg via ORAL
  Filled 2023-10-28 (×12): qty 1

## 2023-10-28 MED ORDER — ENOXAPARIN SODIUM 40 MG/0.4ML IJ SOSY
40.0000 mg | PREFILLED_SYRINGE | INTRAMUSCULAR | Status: DC
  Administered 2023-10-29 – 2023-10-31 (×3): 40 mg via SUBCUTANEOUS
  Filled 2023-10-28 (×3): qty 0.4

## 2023-10-28 MED ORDER — ROSUVASTATIN CALCIUM 5 MG PO TABS
5.0000 mg | ORAL_TABLET | Freq: Every day | ORAL | Status: DC
  Administered 2023-10-29 – 2023-11-09 (×12): 5 mg via ORAL
  Filled 2023-10-28 (×12): qty 1

## 2023-10-28 MED ORDER — COLCHICINE 0.6 MG PO TABS: 0.6000 mg | ORAL_TABLET | Freq: Three times a day (TID) | ORAL | Status: DC | PRN

## 2023-10-28 MED ORDER — ADULT MULTIVITAMIN W/MINERALS CH
1.0000 | ORAL_TABLET | Freq: Every day | ORAL | Status: DC
Start: 2023-10-29 — End: 2023-11-10
  Administered 2023-10-29 – 2023-11-09 (×12): 1 via ORAL
  Filled 2023-10-28 (×12): qty 1

## 2023-10-28 MED ORDER — ALLOPURINOL 300 MG PO TABS
150.0000 mg | ORAL_TABLET | Freq: Every day | ORAL | Status: DC
  Administered 2023-10-29 – 2023-11-09 (×12): 150 mg via ORAL
  Filled 2023-10-28 (×12): qty 1

## 2023-10-28 MED ORDER — LACTATED RINGERS IV SOLN: INTRAVENOUS | Status: DC

## 2023-10-28 MED ORDER — SODIUM CHLORIDE 0.9 % IV BOLUS
1000.0000 mL | Freq: Once | INTRAVENOUS | Status: AC
  Administered 2023-10-28: 1000 mL via INTRAVENOUS

## 2023-10-28 MED ORDER — ENSURE ENLIVE PO LIQD
237.0000 mL | Freq: Two times a day (BID) | ORAL | Status: DC
Start: 2023-10-29 — End: 2023-11-10
  Administered 2023-10-31 – 2023-11-06 (×5): 237 mL via ORAL
  Filled 2023-10-28 (×8): qty 237

## 2023-10-28 MED ORDER — CYANOCOBALAMIN 1000 MCG/ML IJ SOLN
1000.0000 ug | INTRAMUSCULAR | Status: DC
  Administered 2023-10-29 – 2023-11-09 (×4): 1000 ug via INTRAMUSCULAR
  Filled 2023-10-28 (×4): qty 1

## 2023-10-28 MED ORDER — ONDANSETRON HCL 4 MG/2ML IJ SOLN: 4.0000 mg | Freq: Four times a day (QID) | INTRAMUSCULAR | Status: DC | PRN

## 2023-10-28 MED ORDER — VANCOMYCIN HCL IN DEXTROSE 1-5 GM/200ML-% IV SOLN
1000.0000 mg | Freq: Once | INTRAVENOUS | Status: AC
  Administered 2023-10-28: 1000 mg via INTRAVENOUS
  Filled 2023-10-28: qty 200

## 2023-10-28 MED ORDER — ACETAMINOPHEN 650 MG RE SUPP: 650.0000 mg | Freq: Four times a day (QID) | RECTAL | Status: DC | PRN

## 2023-10-28 MED ORDER — VITAMIN C 500 MG PO TABS
500.0000 mg | ORAL_TABLET | Freq: Every day | ORAL | Status: DC
  Administered 2023-10-29 – 2023-11-09 (×12): 500 mg via ORAL
  Filled 2023-10-28 (×12): qty 1

## 2023-10-28 MED ORDER — TRAZODONE HCL 50 MG PO TABS
25.0000 mg | ORAL_TABLET | Freq: Every evening | ORAL | Status: DC | PRN
  Administered 2023-11-03: 25 mg via ORAL
  Filled 2023-10-28: qty 1

## 2023-10-28 MED ORDER — ACETAMINOPHEN 325 MG PO TABS
650.0000 mg | ORAL_TABLET | Freq: Once | ORAL | Status: AC
  Administered 2023-10-28: 650 mg via ORAL
  Filled 2023-10-28: qty 2

## 2023-10-28 MED ORDER — SODIUM CHLORIDE 0.9 % IV SOLN
2.0000 g | Freq: Once | INTRAVENOUS | Status: AC
  Administered 2023-10-28: 2 g via INTRAVENOUS
  Filled 2023-10-28: qty 12.5

## 2023-10-28 MED ORDER — VITAMIN D 25 MCG (1000 UNIT) PO TABS
1000.0000 [IU] | ORAL_TABLET | Freq: Every day | ORAL | Status: DC
Start: 2023-10-29 — End: 2023-11-10
  Administered 2023-10-29 – 2023-11-09 (×12): 1000 [IU] via ORAL
  Filled 2023-10-28 (×12): qty 1

## 2023-10-28 MED ORDER — CALCITRIOL 0.25 MCG PO CAPS
0.2500 ug | ORAL_CAPSULE | Freq: Every day | ORAL | Status: DC
Start: 2023-10-29 — End: 2023-11-10
  Administered 2023-10-29 – 2023-11-09 (×12): 0.25 ug via ORAL
  Filled 2023-10-28 (×12): qty 1

## 2023-10-28 MED ORDER — ACETAMINOPHEN 325 MG PO TABS
650.0000 mg | ORAL_TABLET | Freq: Four times a day (QID) | ORAL | Status: DC | PRN
  Administered 2023-10-31 – 2023-11-02 (×2): 650 mg via ORAL
  Filled 2023-10-28 (×2): qty 2

## 2023-10-28 NOTE — ED Triage Notes (Signed)
Pt c/o chest pain and fever.

## 2023-10-28 NOTE — ED Provider Notes (Signed)
[place] EMERGENCY DEPARTMENT AT [facility] Provider Note   CSN: [id number] Arrival date & time: 10/28/23  4098     History  Chief Complaint  Patient presents with   Chest Pain    Don [name] is a 78 y.o. male.   Chest Pain  78 year old male coming from [facility] nursing facility, he reports he is having chest pain across his chest, the patient had recently been admitted to the hospital for hypotension, he was admitted 2 weeks prior to that for syncope and loss of consciousness, he had been admitted to the hospital in November for acute kidney injury.  He was noted to be febrile today, he does admit to having some occasional cough and shortness of breath, he denies abdominal pain and denies vomiting.  Heart rate on arrival was 160 and temperature was 101.8.    Home Medications Prior to Admission medications   Medication Sig Start Date End Date Taking? Authorizing Provider  acetaminophen (TYLENOL) 650 MG CR tablet Take 650 mg by mouth every 4 (four) hours as needed (general discomfort, elevated temp).    [provider]  albuterol (PROVENTIL) (2.5 MG/3ML) 0.083% nebulizer solution Take 3 mLs (2.5 mg total) by nebulization every 4 (four) hours as needed for wheezing or shortness of breath. 10/16/23   Johnson, Clanford L, MD  albuterol (VENTOLIN HFA) 108 (90 Base) MCG/ACT inhaler Inhale 2 puffs into the lungs every 4 (four) hours as needed for wheezing or shortness of breath. 08/20/23   [provider]  allopurinol (ZYLOPRIM) 100 MG tablet Take 1.5 tablets (150 mg total) by mouth daily. 08/04/23 08/03/24  Vassie Loll, MD  ascorbic acid (VITAMIN C) 500 MG tablet Take 1 tablet (500 mg total) by mouth daily with breakfast. 10/17/23   Cleora Fleet, MD  aspirin EC 81 MG tablet Take 81 mg by mouth daily.    [provider]  calcitRIOL (ROCALTROL) 0.25 MCG capsule Take 0.25 mcg by mouth daily.    [provider]  cholecalciferol  (VITAMIN D3) 25 MCG (1000 UNIT) tablet Take 1,000 Units by mouth daily.    [provider]  colchicine 0.6 MG tablet Take 1.2 mg by mouth every 8 (eight) hours as needed (gout flare). 08/13/23   [provider]  cyanocobalamin (VITAMIN B12) 1000 MCG/ML injection Inject 1 mL (1,000 mcg total) into the muscle See admin instructions. Inject into the muscle twice weekly on Monday, Friday for 4 weeks. Then inject once every 30 days starting on 12/08/23. 10/16/23   Cleora Fleet, MD  feeding supplement (ENSURE ENLIVE / ENSURE PLUS) LIQD Take 237 mLs by mouth 2 (two) times daily between meals. 10/16/23   Johnson, Clanford L, MD  ferrous sulfate 325 (65 FE) MG tablet Take 1 tablet (325 mg total) by mouth daily with breakfast. 10/17/23   Johnson, Clanford L, MD  midodrine (PROAMATINE) 5 MG tablet Take 1 tablet (5 mg total) by mouth 3 (three) times daily with meals. 10/16/23   Cleora Fleet, MD  Multiple Vitamin (MULTIVITAMIN WITH MINERALS) TABS tablet Take 1 tablet by mouth daily. 10/17/23   Johnson, Clanford L, MD  ondansetron (ZOFRAN) 4 MG tablet Take 1 tablet (4 mg total) by mouth every 6 (six) hours. Patient taking differently: Take 4 mg by mouth every 6 (six) hours as needed for nausea or vomiting. 07/24/23   Carmel Sacramento A, PA-C  pantoprazole (PROTONIX) 40 MG tablet Take 1 tablet (40 mg total) by mouth daily.  08/05/23   Vassie Loll, MD  rosuvastatin (CRESTOR) 5 MG tablet Take 5 mg by mouth daily.    [provider]  Tiotropium Bromide Monohydrate (SPIRIVA RESPIMAT) 2.5 MCG/ACT AERS Inhale 2 puffs into the lungs daily.    [provider]      Allergies    Penicillins    Review of Systems   Review of Systems  Cardiovascular:  Positive for chest pain.  All other systems reviewed and are negative.   Physical Exam Updated Vital Signs BP 125/76   Pulse 81   Temp 99.6 F (37.6 C)   Resp (!) 23   Ht 1.829 m (6')   Wt 75 kg   SpO2  96%   BMI 22.42 kg/m  Physical Exam Vitals and nursing note reviewed.  Constitutional:      General: He is in acute distress.     Appearance: He is well-developed. He is ill-appearing.  HENT:     Head: Normocephalic and atraumatic.     Nose: No congestion or rhinorrhea.     Mouth/Throat:     Mouth: Mucous membranes are moist.     Pharynx: No oropharyngeal exudate.  Eyes:     General: No scleral icterus.       Right eye: No discharge.        Left eye: No discharge.     Conjunctiva/sclera: Conjunctivae normal.     Pupils: Pupils are equal, round, and reactive to light.  Neck:     Thyroid: No thyromegaly.     Vascular: No JVD.  Cardiovascular:     Rate and Rhythm: Regular rhythm. Tachycardia present.     Heart sounds: Normal heart sounds. No murmur heard.    No friction rub. No gallop.  Pulmonary:     Effort: Pulmonary effort is normal. No respiratory distress.     Breath sounds: Rales present. No wheezing.     Comments: Subtle rales that clear with deep breathing and coughing Abdominal:     General: Bowel sounds are normal. There is no distension.     Palpations: Abdomen is soft. There is no mass.     Tenderness: There is no abdominal tenderness.  Musculoskeletal:        General: No tenderness. Normal range of motion.     Cervical back: Normal range of motion and neck supple.     Right lower leg: No edema.     Left lower leg: No edema.  Lymphadenopathy:     Cervical: No cervical adenopathy.  Skin:    General: Skin is warm and dry.     Findings: No erythema or rash.  Neurological:     Mental Status: He is alert.     Coordination: Coordination normal.  Psychiatric:        Behavior: Behavior normal.     ED Results / Procedures / Treatments   Labs (all labs ordered are listed, but only abnormal results are displayed) Labs Reviewed  COMPREHENSIVE METABOLIC PANEL - Abnormal; Notable for the following components:      Result Value   Calcium 8.7 (*)    Total Protein  6.1 (*)    Albumin 2.3 (*)    All other components within normal limits  CBC WITH DIFFERENTIAL/PLATELET - Abnormal; Notable for the following components:   RBC 2.72 (*)    Hemoglobin 8.3 (*)    HCT 25.8 (*)    RDW 16.1 (*)    All other components within normal limits  PROTIME-INR -  Abnormal; Notable for the following components:   Prothrombin Time 16.1 (*)    INR 1.3 (*)    All other components within normal limits  URINALYSIS, W/ REFLEX TO CULTURE (INFECTION SUSPECTED) - Abnormal; Notable for the following components:   Color, Urine AMBER (*)    All other components within normal limits  TROPONIN I (HIGH SENSITIVITY) - Abnormal; Notable for the following components:   Troponin I (High Sensitivity) 77 (*)    All other components within normal limits  TROPONIN I (HIGH SENSITIVITY) - Abnormal; Notable for the following components:   Troponin I (High Sensitivity) 597 (*)    All other components within normal limits  RESP PANEL BY RT-PCR (RSV, FLU A&B, COVID)  RVPGX2  CULTURE, BLOOD (ROUTINE X 2)  CULTURE, BLOOD (ROUTINE X 2)  LACTIC ACID, PLASMA  LACTIC ACID, PLASMA  APTT    EKG EKG Interpretation Date/Time:  Sunday October 28 2023 19:01:08 EST Ventricular Rate:  158 PR Interval:  84 QRS Duration:  85 QT Interval:  315 QTC Calculation: 511 R Axis:   58  Text Interpretation: Supraventricular tachycardia Multiple ventricular premature complexes Repolarization abnormality, prob rate related Since last tracing rate faster Confirmed by Eber Hong ([id number]) on 10/28/2023 7:13:48 PM  Radiology DG Chest Port 1 View Result Date: 10/28/2023 CLINICAL DATA:  Chest pain and fever EXAM: PORTABLE CHEST 1 VIEW COMPARISON:  10/13/2023, 09/14/2023, 07/19/2022 FINDINGS: Pleuroparenchymal scarring at the left base. Borderline to mild cardiomegaly. No acute airspace disease or pneumothorax. IMPRESSION: No active disease. Pleuroparenchymal scarring at the left base. Electronically Signed   By: Jasmine Pang M.D.   On: 10/28/2023 19:48    Procedures .Critical Care  Performed by: Eber Hong, MD Authorized by: Eber Hong, MD   Critical care provider statement:    Critical care time (minutes):  30   Critical care time was exclusive of:  Separately billable procedures and treating other patients and teaching time   Critical care was necessary to treat or prevent imminent or life-threatening deterioration of the following conditions:  Cardiac failure   Critical care was time spent personally by me on the following activities:  Development of treatment plan with patient or surrogate, discussions with consultants, evaluation of patient's response to treatment, examination of patient, ordering and review of laboratory studies, ordering and review of radiographic studies, ordering and performing treatments and interventions, pulse oximetry, re-evaluation of patient's condition, review of old charts and obtaining history from patient or surrogate   I assumed direction of critical care for this patient from another provider in my specialty: no     Care discussed with: admitting provider   Comments:           Medications Ordered in ED Medications  lactated ringers infusion ( Intravenous New Bag/Given 10/28/23 2022)  sodium chloride 0.9 % bolus 1,000 mL (1,000 mLs Intravenous Bolus 10/28/23 1941)  vancomycin (VANCOCIN) IVPB 1000 mg/200 mL premix (0 mg Intravenous Stopped 10/28/23 2126)  ceFEPIme (MAXIPIME) 2 g in sodium chloride 0.9 % 100 mL IVPB (0 g Intravenous Stopped 10/28/23 2013)  acetaminophen (TYLENOL) tablet 650 mg (650 mg Oral Given 10/28/23 1943)    ED Course/ Medical Decision Making/ A&P                                 Medical Decision Making Amount and/or Complexity of Data Reviewed Labs: ordered. Radiology: ordered. ECG/medicine tests: ordered.  Risk OTC drugs.  Prescription drug management. Decision regarding hospitalization.    This patient presents to the ED for  concern of fever tachycardia and chest pain, this involves an extensive number of treatment options, and is a complaint that carries with it a high risk of complications and morbidity.  The differential diagnosis includes myocarditis, influenza, pneumonia, sepsis   Co morbidities that complicate the patient evaluation  Known history of rate 1 diastolic dysfunction with congestive heart failure and a preserved ejection fraction as of an echocardiogram in January 2025 High cholesterol   Additional history obtained:  Additional history obtained from medical record, the patient has a DNR status, he was dehydrated at his prior admission, on midodrine for hypotension sepsis was ruled out External records from outside source obtained and reviewed including prior admission notes   Lab Tests:  I Ordered, and personally interpreted labs.  The pertinent results include: Troponin that started at 77 and rose to almost 600, lactate of 1.0 metabolic panel which was unremarkable except for a low albumin at 2.3 which is chronic, liver function is unremarkable, CBC without leukocytosis, chronic anemia, no change.  INR of 1.3, urinalysis without obvious infection   Imaging Studies ordered:  I ordered imaging studies including chest x-ray I independently visualized and interpreted imaging which showed scarring at the lung base but no signs of acute airspace opacities I agree with the radiologist interpretation   Cardiac Monitoring: / EKG:  The patient was maintained on a cardiac monitor.  I personally viewed and interpreted the cardiac monitored which showed an underlying rhythm of: Initially the patient was very tachycardic and what appeared to be a atrial flutter, with treatment of the fever this came down to a sinus rhythm in fact repeat EKG performed at 10:41 PM on February 2 showed what is likely normal sinus rhythm with sinus arrhythmia, no ST elevation or depression   Consultations Obtained:  I  requested consultation with the cardiologist Dr. Luanna Salk as well as the hospitalist Dr. Mertie Moores, the hospitalist will admit,  and discussed lab and imaging findings as well as pertinent plan - they recommend: Admit, avoid anticoagulation as this is likely secondary stress-related elevation in troponin and unlikely to be primary ischemia   Problem List / ED Course / Critical interventions / Medication management  Due to the patient's high heart rate and high fever on arrival there was concern for sepsis and he was given vancomycin and Maxipime, on further review there is no obvious source of fever found at this point raising concern for possible myocarditis and the patient will need to come into the hospital, he is critically ill with a potential life-threatening illness. I ordered medication including vancomycin and cefepime for fever Reevaluation of the patient after these medicines showed that the patient improved, patient also given antipyretic with resolution of fever I have reviewed the patients home medicines and have made adjustments as needed   Social Determinants of Health:  Nursing home patient   Test / Admission - Considered:  Admit to higher level of care         Final Clinical Impression(s) / ED Diagnoses Final diagnoses:  Elevated troponin  Chest pain at rest  Fever, unspecified fever cause    Rx / DC Orders ED Discharge Orders     None         Eber Hong, MD 10/28/23 2245

## 2023-10-28 NOTE — Progress Notes (Signed)
CODE SEPSIS - PHARMACY COMMUNICATION  **[facility] Antibiotics should be administered within 1 hour of Sepsis diagnosis**  Time Code Sepsis Called/Page Received: 1922  Antibiotics Ordered: cefepime, vancomycin  Time of 1st antibiotic administration: 1943  Additional action taken by pharmacy: N/A  If necessary, Name of Provider/Nurse Contacted: N/A    Merryl Hacker ,PharmD Clinical Pharmacist  10/28/2023  7:23 PM

## 2023-10-28 NOTE — Progress Notes (Signed)
 Pt being followed by [facility] for Sepsis protocol.

## 2023-10-29 ENCOUNTER — Inpatient Hospital Stay (HOSPITAL_COMMUNITY): Payer: 59

## 2023-10-29 DIAGNOSIS — I501 Left ventricular failure: Secondary | ICD-10-CM

## 2023-10-29 DIAGNOSIS — E785 Hyperlipidemia, unspecified: Secondary | ICD-10-CM

## 2023-10-29 DIAGNOSIS — K219 Gastro-esophageal reflux disease without esophagitis: Secondary | ICD-10-CM

## 2023-10-29 DIAGNOSIS — I409 Acute myocarditis, unspecified: Secondary | ICD-10-CM | POA: Diagnosis not present

## 2023-10-29 LAB — BASIC METABOLIC PANEL
Anion gap: 10 (ref 5–15)
BUN: 10 mg/dL (ref 8–23)
CO2: 24 mmol/L (ref 22–32)
Calcium: 8.9 mg/dL (ref 8.9–10.3)
Chloride: 104 mmol/L (ref 98–111)
Creatinine, Ser: 0.79 mg/dL (ref 0.61–1.24)
GFR, Estimated: >60 mL/min (ref >60)
Glucose, Bld: 96 mg/dL (ref 70–99)
Potassium: 3.9 mmol/L (ref 3.5–5.1)
Sodium: 138 mmol/L (ref 135–145)

## 2023-10-29 LAB — LIPID PANEL
Cholesterol: 94 mg/dL (ref 0–200)
HDL: 41 mg/dL (ref >40)
LDL Cholesterol: 40 mg/dL (ref 0–99)
Total CHOL/HDL Ratio: 2.3 {ratio}
Triglycerides: 67 mg/dL (ref <150)
VLDL: 13 mg/dL (ref 0–40)

## 2023-10-29 LAB — TROPONIN I (HIGH SENSITIVITY)
Troponin I (High Sensitivity): 1084 ng/L (ref <18)
Troponin I (High Sensitivity): 779 ng/L (ref <18)
Troponin I (High Sensitivity): 580 ng/L (ref <18)

## 2023-10-29 LAB — ECHOCARDIOGRAM LIMITED
Height: 72 in
S' Lateral: 3.3 cm
Weight: 2645.52 [oz_av]

## 2023-10-29 LAB — CBC
HCT: 29.7 % — ABNORMAL LOW (ref 39.0–52.0)
Hemoglobin: 9.2 g/dL — ABNORMAL LOW (ref 13.0–17.0)
MCH: 30.8 pg (ref 26.0–34.0)
MCHC: 31 g/dL (ref 30.0–36.0)
MCV: 99.3 fL (ref 80.0–100.0)
Platelets: 375 10*3/uL (ref 150–400)
RBC: 2.99 MIL/uL — ABNORMAL LOW (ref 4.22–5.81)
RDW: 16.1 % — ABNORMAL HIGH (ref 11.5–15.5)
WBC: 9.8 10*3/uL (ref 4.0–10.5)
nRBC: 0 % (ref 0.0–0.2)

## 2023-10-29 LAB — RESPIRATORY PANEL BY PCR

## 2023-10-29 LAB — PROCALCITONIN: Procalcitonin: <0.1 ng/mL

## 2023-10-29 LAB — SEDIMENTATION RATE: Sed Rate: 85 mm/h — ABNORMAL HIGH (ref 0–16)

## 2023-10-29 LAB — C-REACTIVE PROTEIN: CRP: 14.9 mg/dL — ABNORMAL HIGH (ref <1.0)

## 2023-10-29 LAB — BRAIN NATRIURETIC PEPTIDE: B Natriuretic Peptide: 1224 pg/mL — ABNORMAL HIGH (ref 0.0–100.0)

## 2023-10-29 MED ORDER — MORPHINE SULFATE (PF) 2 MG/ML IV SOLN: 2.0000 mg | INTRAVENOUS | Status: DC | PRN

## 2023-10-29 MED ORDER — ALBUTEROL SULFATE (2.5 MG/3ML) 0.083% IN NEBU
2.5000 mg | INHALATION_SOLUTION | RESPIRATORY_TRACT | Status: DC | PRN
  Administered 2023-11-06 – 2023-11-07 (×2): 2.5 mg via RESPIRATORY_TRACT
  Filled 2023-10-29 (×2): qty 3

## 2023-10-29 MED ORDER — CARVEDILOL 3.125 MG PO TABS
3.1250 mg | ORAL_TABLET | Freq: Two times a day (BID) | ORAL | Status: DC
  Administered 2023-10-29 – 2023-10-30 (×2): 3.125 mg via ORAL
  Filled 2023-10-29 (×2): qty 1

## 2023-10-29 MED ORDER — TIOTROPIUM BROMIDE MONOHYDRATE 2.5 MCG/ACT IN AERS: 2.0000 | INHALATION_SPRAY | Freq: Every day | RESPIRATORY_TRACT | Status: DC

## 2023-10-29 MED ORDER — MIDODRINE HCL 5 MG PO TABS: 5.0000 mg | ORAL_TABLET | Freq: Three times a day (TID) | ORAL | Status: DC | PRN

## 2023-10-29 MED ORDER — KETOROLAC TROMETHAMINE 15 MG/ML IJ SOLN: 15.0000 mg | Freq: Four times a day (QID) | INTRAMUSCULAR | Status: DC | PRN

## 2023-10-29 MED ORDER — HYDRALAZINE HCL 25 MG PO TABS
25.0000 mg | ORAL_TABLET | Freq: Four times a day (QID) | ORAL | Status: DC | PRN
  Administered 2023-10-29: 25 mg via ORAL
  Filled 2023-10-29: qty 1

## 2023-10-29 MED ORDER — NITROGLYCERIN 0.4 MG SL SUBL: 0.4000 mg | SUBLINGUAL_TABLET | SUBLINGUAL | Status: DC | PRN

## 2023-10-29 MED ORDER — UMECLIDINIUM BROMIDE 62.5 MCG/ACT IN AEPB
1.0000 | INHALATION_SPRAY | Freq: Every day | RESPIRATORY_TRACT | Status: DC
Start: 2023-10-30 — End: 2023-11-10
  Administered 2023-10-30 – 2023-11-09 (×10): 1 via RESPIRATORY_TRACT
  Filled 2023-10-29 (×2): qty 7

## 2023-10-29 NOTE — TOC Initial Note (Signed)
Transition of Care [facility]) - Initial/Assessment Note    Patient Details  Name: Don [name] MRN: [id number] Date of Birth: 1946-06-25  [facility] CM/SW Contact:    Elliot Gault, LCSW Phone Number: 10/29/2023, 10:25 AM  Clinical Narrative:                  Pt admitted from [facility] where he was in the short term rehab unit. Pt known to TOC from recent admissions.  Per MD, pt will transfer to [facility] for further care related to myocarditis. Anticipating pt will return to [facility] upon dc from [place].  TOC at [facility] will follow and further assist with dc planning.  Expected Discharge Plan: Skilled Nursing Facility Barriers to Discharge: Continued Medical Work up   Patient Goals and CMS Choice Patient states their goals for this hospitalization and ongoing recovery are:: get better          Expected Discharge Plan and Services In-house Referral: Clinical Social Work     Living arrangements for the past 2 months: Skilled Nursing Facility                                      Prior Living Arrangements/Services Living arrangements for the past 2 months: Skilled Nursing Facility   Patient language and need for interpreter reviewed:: Yes Do you feel safe going back to the place where you live?: Yes      Need for Family Participation in Patient Care: Yes (Comment) Care giver support system in place?: Yes (comment)   Criminal Activity/Legal Involvement Pertinent to Current Situation/Hospitalization: No - Comment as needed  Activities of Daily Living      Permission Sought/Granted                  Emotional Assessment Appearance:: Appears stated age     Orientation: : Oriented to Self, Oriented to Place, Oriented to Situation Alcohol / Substance Use: Not Applicable Psych Involvement: No (comment)  Admission diagnosis:  Acute myocarditis [I40.9] Patient Active Problem List   Diagnosis Date Noted   GERD without esophagitis  10/29/2023   Dyslipidemia 10/29/2023   Acute myocarditis 10/28/2023   Dehydration 10/15/2023   Sepsis (HCC) 10/14/2023   Hypotension 10/13/2023   Syncope 09/29/2023   NICM (nonischemic cardiomyopathy) (HCC) 09/29/2023   CKD (chronic kidney disease) stage 3, GFR 30-59 ml/min (HCC) 09/29/2023   Right hand pain 08/04/2023   Atypical chest pain 08/04/2023   AKI (acute kidney injury) (HCC) 08/03/2023   Leukocytosis 08/03/2023   Falls 08/03/2023   Hypertension    CHF (congestive heart failure) (HCC) 04/19/2019   Pleural effusion    High cholesterol 03/19/2015   GERD (gastroesophageal reflux disease) 03/19/2015   Gout 03/19/2015   Hepatitis C 03/19/2015   PCP:  [facility]:   [facility] - [place], [place] - [address] [place] [address] Phone: [phone] Fax: [phone]     Social Drivers of Health (SDOH) Social History: SDOH Screenings   Food Insecurity: No Food Insecurity (10/14/2023)  Housing: Low Risk  (10/14/2023)  Transportation Needs: No Transportation Needs (10/14/2023)  Utilities: Not At Risk (10/14/2023)  Social Connections: Unknown (10/14/2023)  Tobacco Use: Medium Risk (10/28/2023)   SDOH Interventions:     Readmission Risk Interventions    10/29/2023   10:25 AM 10/14/2023   11:02 AM  Readmission Risk Prevention Plan  Transportation Screening Complete Complete  HRI or Home Care Consult  Complete  Social Work Consult for Recovery Care Planning/Counseling  Complete  Palliative Care Screening  Not Applicable  Medication Review Oceanographer) Complete Complete  HRI or Home Care Consult Complete   SW Recovery Care/Counseling Consult Complete   Palliative Care Screening Not Applicable   Skilled Nursing Facility Complete

## 2023-10-29 NOTE — Assessment & Plan Note (Signed)
 -  We will continue PPI therapy

## 2023-10-29 NOTE — [place] FL2 (Signed)
[place] MEDICAID FL2 LEVEL OF CARE FORM     IDENTIFICATION  Patient Name: Don [name] Birthdate: 1946-08-23 Sex: male Admission Date (Current Location): 10/28/2023  [facility] and [place] Number:  [employer] and Address:  [place]. [facility], [address] [place], [place] [address]      Provider Number: [id number]  Attending Physician Name and Address:  [place], Alvira Philips, DO  Relative Name and Phone Number:       Current Level of Care: Hospital Recommended Level of Care: Skilled Nursing Facility Prior Approval Number:    Date Approved/Denied:   PASRR Number: [id number]  Discharge Plan: SNF    Current Diagnoses: Patient Active Problem List   Diagnosis Date Noted   GERD without esophagitis 10/29/2023   Dyslipidemia 10/29/2023   Acute myocarditis 10/28/2023   Dehydration 10/15/2023   Sepsis (HCC) 10/14/2023   Hypotension 10/13/2023   Syncope 09/29/2023   NICM (nonischemic cardiomyopathy) (HCC) 09/29/2023   CKD (chronic kidney disease) stage 3, GFR 30-59 ml/min (HCC) 09/29/2023   Right hand pain 08/04/2023   Atypical chest pain 08/04/2023   AKI (acute kidney injury) (HCC) 08/03/2023   Leukocytosis 08/03/2023   Falls 08/03/2023   Hypertension    CHF (congestive heart failure) (HCC) 04/19/2019   Pleural effusion    High cholesterol 03/19/2015   GERD (gastroesophageal reflux disease) 03/19/2015   Gout 03/19/2015   Hepatitis C 03/19/2015    Orientation RESPIRATION BLADDER Height & Weight     Self, Situation, Place  Normal Incontinent Weight: 165 lb 5.5 oz (75 kg) Height:  6' (182.9 cm)  BEHAVIORAL SYMPTOMS/MOOD NEUROLOGICAL BOWEL NUTRITION STATUS      Continent Diet (see dc summary)  AMBULATORY STATUS COMMUNICATION OF NEEDS Skin   Extensive Assist Verbally Normal                       Personal Care Assistance Level of Assistance  Bathing, Feeding, Dressing Bathing Assistance: Limited assistance Feeding assistance:  Independent Dressing Assistance: Limited assistance     Functional Limitations Info  Sight, Hearing, Speech Sight Info: Adequate Hearing Info: Impaired Speech Info: Adequate    SPECIAL CARE FACTORS FREQUENCY  PT (By licensed PT), OT (By licensed OT)     PT Frequency: 5x week OT Frequency: 5x week            Contractures Contractures Info: Not present    Additional Factors Info  Code Status, Allergies Code Status Info: Full Allergies Info: Penicillins           Current Medications (10/29/2023):  This is the current hospital active medication list Current Facility-Administered Medications  Medication Dose Route Frequency Provider Last Rate Last Admin   acetaminophen (TYLENOL) tablet 650 mg  650 mg Oral Q6H PRN Mansy, Jan A, MD       Or   acetaminophen (TYLENOL) suppository 650 mg  650 mg Rectal Q6H PRN Mansy, Jan A, MD       albuterol (PROVENTIL) (2.5 MG/3ML) 0.083% nebulizer solution 2.5 mg  2.5 mg Nebulization Q4H PRN [place], Eric J, DO       allopurinol (ZYLOPRIM) tablet 150 mg  150 mg Oral Daily Mansy, Jan A, MD       ascorbic acid (VITAMIN C) tablet 500 mg  500 mg Oral Q breakfast Mansy, Jan A, MD   500 mg at 10/29/23 3875   aspirin EC tablet 81 mg  81 mg Oral Daily Mansy, Vernetta Honey, MD  calcitRIOL (ROCALTROL) capsule 0.25 mcg  0.25 mcg Oral Daily Mansy, Jan A, MD       carvedilol (COREG) tablet 3.125 mg  3.125 mg Oral BID WC [place], Eric J, DO       cholecalciferol (VITAMIN D3) 25 MCG (1000 UNIT) tablet 1,000 Units  1,000 Units Oral Daily Mansy, Jan A, MD       colchicine tablet 1.2 mg  1.2 mg Oral Q8H PRN Mansy, Jan A, MD       cyanocobalamin (VITAMIN B12) injection 1,000 mcg  1,000 mcg Intramuscular See admin instructions Mansy, Jan A, MD       enoxaparin (LOVENOX) injection 40 mg  40 mg Subcutaneous Q24H Mansy, Jan A, MD       feeding supplement (ENSURE ENLIVE / ENSURE PLUS) liquid 237 mL  237 mL Oral BID BM Mansy, Jan A, MD       ferrous sulfate tablet 325 mg   325 mg Oral Q breakfast Mansy, Jan A, MD   325 mg at 10/29/23 1610   magnesium hydroxide (MILK OF MAGNESIA) suspension 30 mL  30 mL Oral Daily PRN Mansy, Jan A, MD       midodrine (PROAMATINE) tablet 5 mg  5 mg Oral TID PRN [place], Eric J, DO       morphine (PF) 2 MG/ML injection 2 mg  2 mg Intravenous Q2H PRN Mansy, Jan A, MD       multivitamin with minerals tablet 1 tablet  1 tablet Oral Daily Mansy, Jan A, MD       nitroGLYCERIN (NITROSTAT) SL tablet 0.4 mg  0.4 mg Sublingual Q5 min PRN Mansy, Jan A, MD       ondansetron [facility]) tablet 4 mg  4 mg Oral Q6H PRN Mansy, Jan A, MD       Or   ondansetron [facility]) injection 4 mg  4 mg Intravenous Q6H PRN Mansy, Jan A, MD       pantoprazole (PROTONIX) EC tablet 40 mg  40 mg Oral Daily Mansy, Jan A, MD       rosuvastatin (CRESTOR) tablet 5 mg  5 mg Oral Daily Mansy, Jan A, MD       Tiotropium Bromide Monohydrate AERS 2 puff  2 puff Inhalation Daily [place], Alvira Philips, DO       traZODone (DESYREL) tablet 25 mg  25 mg Oral QHS PRN Mansy, Vernetta Honey, MD       Current Outpatient Medications  Medication Sig Dispense Refill   ACETAMINOPHEN ER PO Take 325 mg by mouth every 4 (four) hours as needed (general discomfort, elevated temp).     albuterol (PROVENTIL) (2.5 MG/3ML) 0.083% nebulizer solution Take 3 mLs (2.5 mg total) by nebulization every 4 (four) hours as needed for wheezing or shortness of breath.     albuterol (VENTOLIN HFA) 108 (90 Base) MCG/ACT inhaler Inhale 2 puffs into the lungs every 4 (four) hours as needed for wheezing or shortness of breath.     allopurinol (ZYLOPRIM) 100 MG tablet Take 1.5 tablets (150 mg total) by mouth daily. 45 tablet 2   ascorbic acid (VITAMIN C) 500 MG tablet Take 1 tablet (500 mg total) by mouth daily with breakfast.     aspirin EC 81 MG tablet Take 81 mg by mouth daily.     calcitRIOL (ROCALTROL) 0.25 MCG capsule Take 0.25 mcg by mouth daily.     carvedilol (COREG) 3.125 MG tablet Take 3.125 mg by mouth 2 (two) times  daily with  a meal.     cholecalciferol (VITAMIN D3) 25 MCG (1000 UNIT) tablet Take 1,000 Units by mouth daily.     colchicine 0.6 MG tablet Take 1.2 mg by mouth every 8 (eight) hours as needed (gout flare).     ferrous sulfate 325 (65 FE) MG tablet Take 1 tablet (325 mg total) by mouth daily with breakfast.     midodrine (PROAMATINE) 5 MG tablet Take 1 tablet (5 mg total) by mouth 3 (three) times daily with meals.     Multiple Vitamin (MULTIVITAMIN WITH MINERALS) TABS tablet Take 1 tablet by mouth daily.     ondansetron (ZOFRAN) 4 MG tablet Take 1 tablet (4 mg total) by mouth every 6 (six) hours. (Patient taking differently: Take 4 mg by mouth every 6 (six) hours as needed for nausea or vomiting.) 12 tablet 0   pantoprazole (PROTONIX) 40 MG tablet Take 1 tablet (40 mg total) by mouth daily. 30 tablet 1   rosuvastatin (CRESTOR) 5 MG tablet Take 5 mg by mouth daily.     Tiotropium Bromide Monohydrate (SPIRIVA RESPIMAT) 2.5 MCG/ACT AERS Inhale 2 puffs into the lungs daily.     cyanocobalamin (VITAMIN B12) 1000 MCG/ML injection Inject 1 mL (1,000 mcg total) into the muscle See admin instructions. Inject into the muscle twice weekly on Monday, Friday for 4 weeks. Then inject once every 30 days starting on 12/08/23.     feeding supplement (ENSURE ENLIVE / ENSURE PLUS) LIQD Take 237 mLs by mouth 2 (two) times daily between meals.       Discharge Medications: Please see discharge summary for a list of discharge medications.  Relevant Imaging Results:  Relevant Lab Results:   Additional Information SSN: [id number]  Elliot Gault, LCSW

## 2023-10-29 NOTE — Progress Notes (Signed)
PROGRESS NOTE    Don [name]  ION:[id number] DOB: 03/18/1946 DOA: 10/28/2023 PCP: [facility] Ort [facility]    Brief Narrative:   Don [name] is a 78 y.o. male with past medical history significant for chronic systolic congestive heart failure (LVEF 20-25%), CKD stage IIIa, HTN, hyperlipidemia, cirrhosis, gout, hepatitis C who presented to [facility] ED on 2/2 via EMS from [facility] with complaints of chest pain and fever.  Patient reports acute onset substernal chest pain without radiation.  Patient states was there for "some time" and unable to quantify actual duration but subsided without any specific intervention.  Denies any recent sick contacts, no changes in his medications although resides at a SNF.  Further denied any nausea, no vomiting/diarrhea, no diaphoresis, no shortness of breath, no cough, no chills, no urinary symptoms, no headache, no visual disturbance.  In the ED, temperature 101.8 F, HR 160, RR 20, BP 175/105, SpO2 95% on room air.  WBC 9.7, hemoglobin 8.3, platelet count 381.  Sodium 136, potassium 3.7, chloride 101, CO2 26, glucose 98, BUN 12, creatinine 0.90.  AST 33, ALT 24, total bilirubin 0.7.  High-sensitivity troponin 77>597.  Lactic acid 1.2.  COVID/influenza/RSV PCR negative.  Urinalysis unrevealing.  Chest x-ray with no acute cardiopulmonary disease process.  Blood cultures x 2 obtained.  EKG notable for supraventricular tachycardia, rate 158.  Patient was given IV cefepime/vancomycin, Tylenol 650 mg p.o. x 1, 1 L NS bolus.  Cardiology was consulted by EDP.  TRH was consulted for admission for concern of acute myocarditis.  Assessment & Plan:   Acute myocarditis Patient presenting with chest pain, localized to the substernal region lasting for unknown amount of time that self resolved.  Denies radiation.  Troponin initially elevated 77 followed by 597.  EKG with SVT as below.  Repeat EKG with normal sinus rhythm, ventricular bigeminy, rate 72  with T wave inversions 2, 3, aVF which is a change since 10/15/2023.  Influenza/RSV/COVID PCR negative.  Procalcitonin less than 0.10; unlikely infectious etiology. -- Cardiology consulted by EDP recommended transfer to [facility] for further evaluation and management -- Troponin [phone] -- ESR elevated 85 -- CRP: Pending -- Respiratory viral panel: Pending (continue droplet precautions until test results) -- ANA with reflex: Pending -- Pending transfer to [facility] for evaluation by cardiology service, may need cardiac MRI, coronary angiography -- Continue monitor on telemetry -- Continue to trend troponin -- Aspirin, statin, IV Toradol as needed  Supraventricular tachycardia: Resolved On arrival, patient's heart rate elevated at 160, EKG with supraventricular tachycardia with rate 158, converted back to NSR following treatment of fever with Tylenol and 1 L NS bolus. -- Carvedilol 3.125 mg p.o. twice daily -- Continue monitor on telemetry  Fever: Resolved 101.8 F on admission.  WBC count 9.7, lactic acid 1.2.  Chest x-ray with no focal consolidation.  Urinalysis unrevealing.  Procalcitonin less than 0.10.  COVID/influenza/RSV PCR negative.  No infectious source found, did receive 1 dose IV vancomycin/cefepime by EDP.  Suspect fever possibly due to acute myocarditis as above. -- Blood cultures x 2: Pending -- Respiratory viral panel: Pending (continue droplet precautions until test results) -- Will hold further antibiotics at this time -- Repeat CBC in a.m.  Chronic systolic congestive heart failure Essential hypertension -- TTE: Pending -- Carvedilol 3.125 mg p.o. twice daily -- Continue aspirin and statin -- Continue monitor on telemetry  Hyperlipidemia -- Crestor 5 mg p.o. daily  CKD stage IIIa -- Cr  0.90>0.79; stable.  Cirrhosis -- Low-salt diet  Gout -- Allopurinol 150 mg p.o. daily -- Colchicine as needed  History of hepatitis C Outpatient  follow-up with gastroenterology/infectious disease.  GERD -- Protonix 40 mg p.o. daily  DVT prophylaxis: enoxaparin (LOVENOX) injection 40 mg Start: 10/29/23 1000    Code Status: Full Code Family Communication:   Disposition Plan:  Level of care: Progressive Status is: Inpatient Remains inpatient appropriate because: Pending transfer to [facility]    Consultants:  Cardiology  Procedures:  TTE: Pending  Antimicrobials:  Vancomycin 2/2 - 2/2 Cefepime 2/2 - 2/2   Subjective: Patient seen examined bedside, resting comfortably.  Lying in bed.  Remains in ED holding area waiting for transfer to [facility].  Continues to deny chest pain.  No questions, concerns or complaints this morning.  Further denies headache, no dizziness, no chest pain, no palpitations, no shortness of breath, no abdominal pain, no fever/chills/night sweats, no nausea/vomiting/diarrhea, no focal weakness, no abdominal pain, no fatigue, no paresthesias.  No acute events overnight per nurse staff.  Objective: Vitals:   10/29/23 0700 10/29/23 0747 10/29/23 0800 10/29/23 0900  BP: (!) 165/89  (!) 148/86 (!) 149/87  Pulse: 80  81 81  Resp: 18  (!) 24 (!) 22  Temp:  97.8 F (36.6 C)    TempSrc:  Oral    SpO2: 97%  96% 95%  Weight:      Height:        Intake/Output Summary (Last 24 hours) at 10/29/2023 0955 Last data filed at 10/28/2023 2158 Gross per 24 hour  Intake 296.59 ml  Output --  Net 296.59 ml   Filed Weights   10/28/23 1856  Weight: 75 kg    Examination:  Physical Exam: GEN: NAD, alert and oriented x 3, chronically ill appearance, appears older than stated age HEENT: NCAT, PERRL, EOMI, sclera clear, MMM PULM: CTAB w/o wheezes/crackles, normal respiratory effort, on room air CV: RRR w/o M/G/R GI: abd soft, NTND, NABS, no R/G/M MSK: no peripheral edema, muscle strength globally intact 5/5 bilateral upper/lower extremities NEURO: CN II-XII intact, no focal deficits, sensation to  light touch intact PSYCH: normal mood/affect Integumentary: No concerning rashes/lesions/wounds noted on exposed skin surfaces    Data Reviewed: I have personally reviewed following labs and imaging studies  CBC: Recent Labs  Lab 10/28/23 1933 10/29/23 0516  WBC 9.7 9.8  NEUTROABS 7.6  --   HGB 8.3* 9.2*  HCT 25.8* 29.7*  MCV 94.9 99.3  PLT 381 375   Basic Metabolic Panel: Recent Labs  Lab 10/28/23 1933 10/29/23 0516  NA 136 138  K 3.7 3.9  CL 101 104  CO2 26 24  GLUCOSE 98 96  BUN 12 10  CREATININE 0.90 0.79  CALCIUM 8.7* 8.9   GFR: Estimated Creatinine Clearance: 82 mL/min (by C-G formula based on SCr of 0.79 mg/dL). Liver Function Tests: Recent Labs  Lab 10/28/23 1933  AST 33  ALT 24  ALKPHOS 122  BILITOT 0.7  PROT 6.1*  ALBUMIN 2.3*   No results for input(s): "LIPASE", "AMYLASE" in the last 168 hours. No results for input(s): "AMMONIA" in the last 168 hours. Coagulation Profile: Recent Labs  Lab 10/28/23 1933  INR 1.3*   Cardiac Enzymes: No results for input(s): "CKTOTAL", "CKMB", "CKMBINDEX", "TROPONINI" in the last 168 hours. BNP (last 3 results) No results for input(s): "PROBNP" in the last 8760 hours. HbA1C: No results for input(s): "HGBA1C" in the last 72 hours. CBG: No results  for input(s): "GLUCAP" in the last 168 hours. Lipid Profile: Recent Labs    10/29/23 0516  CHOL 94  HDL 41  LDLCALC 40  TRIG 67  CHOLHDL 2.3   Thyroid Function Tests: No results for input(s): "TSH", "T4TOTAL", "FREET4", "T3FREE", "THYROIDAB" in the last 72 hours. Anemia Panel: No results for input(s): "VITAMINB12", "FOLATE", "FERRITIN", "TIBC", "IRON", "RETICCTPCT" in the last 72 hours. Sepsis Labs: Recent Labs  Lab 10/28/23 1933 10/28/23 2137 10/29/23 0516  PROCALCITON  --   --  <0.10  LATICACIDVEN 1.2 1.0  --     Recent Results (from the past 240 hours)  Resp panel by RT-PCR (RSV, Flu A&B, Covid) Anterior Nasal Swab     Status: None    Collection Time: 10/28/23  7:22 PM   Specimen: Anterior Nasal Swab  Result Value Ref Range Status   SARS Coronavirus 2 by RT PCR NEGATIVE NEGATIVE Final    Comment: (NOTE) SARS-CoV-2 target nucleic acids are NOT DETECTED.  The SARS-CoV-2 RNA is generally detectable in upper respiratory specimens during the acute phase of infection. The lowest concentration of SARS-CoV-2 viral copies this assay can detect is 138 copies/mL. A negative result does not preclude SARS-Cov-2 infection and should not be used as the sole basis for treatment or other patient management decisions. A negative result may occur with  improper specimen collection/handling, submission of specimen other than nasopharyngeal swab, presence of viral mutation(s) within the areas targeted by this assay, and inadequate number of viral copies(<138 copies/mL). A negative result must be combined with clinical observations, patient history, and epidemiological information. The expected result is Negative.  Fact Sheet for Patients:  BloggerCourse.com  Fact Sheet for Healthcare Providers:  SeriousBroker.it  This test is no t yet approved or cleared by the [place] FDA and  has been authorized for detection and/or diagnosis of SARS-CoV-2 by FDA under an Emergency Use Authorization (EUA). This EUA will remain  in effect (meaning this test can be used) for the duration of the COVID-19 declaration under Section 564(b)(1) of the Act, 21 U.S.C.section 360bbb-3(b)(1), unless the authorization is terminated  or revoked sooner.       Influenza A by PCR NEGATIVE NEGATIVE Final   Influenza B by PCR NEGATIVE NEGATIVE Final    Comment: (NOTE) The Xpert Xpress SARS-CoV-2/FLU/RSV plus assay is intended as an aid in the diagnosis of influenza from Nasopharyngeal swab specimens and should not be used as a sole basis for treatment. Nasal washings and aspirates are unacceptable for  Xpert Xpress SARS-CoV-2/FLU/RSV testing.  Fact Sheet for Patients: BloggerCourse.com  Fact Sheet for Healthcare Providers: SeriousBroker.it  This test is not yet approved or cleared by the [place] FDA and has been authorized for detection and/or diagnosis of SARS-CoV-2 by FDA under an Emergency Use Authorization (EUA). This EUA will remain in effect (meaning this test can be used) for the duration of the COVID-19 declaration under Section 564(b)(1) of the Act, 21 U.S.C. section 360bbb-3(b)(1), unless the authorization is terminated or revoked.     Resp Syncytial Virus by PCR NEGATIVE NEGATIVE Final    Comment: (NOTE) Fact Sheet for Patients: BloggerCourse.com  Fact Sheet for Healthcare Providers: SeriousBroker.it  This test is not yet approved or cleared by the [place] FDA and has been authorized for detection and/or diagnosis of SARS-CoV-2 by FDA under an Emergency Use Authorization (EUA). This EUA will remain in effect (meaning this test can be used) for the duration of the COVID-19 declaration under Section 564(b)(1) of the  Act, 21 U.S.C. section 360bbb-3(b)(1), unless the authorization is terminated or revoked.  Performed at [facility], [address] [place], [place] [address]   Blood Culture (routine x 2)     Status: None (Preliminary result)   Collection Time: 10/28/23  7:33 PM   Specimen: Left Antecubital; Blood  Result Value Ref Range Status   Specimen Description LEFT ANTECUBITAL BLOOD  Final   Special Requests   Final    BOTTLES DRAWN AEROBIC AND ANAEROBIC Blood Culture adequate volume Performed at [facility], [address] [place], [place] [address]    Culture PENDING  Incomplete   Report Status PENDING  Incomplete  Blood Culture (routine x 2)     Status: None (Preliminary result)   Collection Time: 10/28/23  7:43 PM   Specimen: BLOOD RIGHT  FOREARM  Result Value Ref Range Status   Specimen Description BLOOD RIGHT FOREARM BLOOD  Final   Special Requests   Final    BOTTLES DRAWN AEROBIC AND ANAEROBIC Blood Culture adequate volume Performed at [facility], [address] [place], [place] [address]    Culture PENDING  Incomplete   Report Status PENDING  Incomplete         Radiology Studies: DG Chest Port 1 View Result Date: 10/28/2023 CLINICAL DATA:  Chest pain and fever EXAM: PORTABLE CHEST 1 VIEW COMPARISON:  10/13/2023, 09/14/2023, 07/19/2022 FINDINGS: Pleuroparenchymal scarring at the left base. Borderline to mild cardiomegaly. No acute airspace disease or pneumothorax. IMPRESSION: No active disease. Pleuroparenchymal scarring at the left base. Electronically Signed   By: Jasmine Pang M.D.   On: 10/28/2023 19:48        Scheduled Meds:  allopurinol  150 mg Oral Daily   ascorbic acid  500 mg Oral Q breakfast   aspirin EC  81 mg Oral Daily   calcitRIOL  0.25 mcg Oral Daily   cholecalciferol  1,000 Units Oral Daily   cyanocobalamin  1,000 mcg Intramuscular See admin instructions   enoxaparin (LOVENOX) injection  40 mg Subcutaneous Q24H   feeding supplement  237 mL Oral BID BM   ferrous sulfate  325 mg Oral Q breakfast   midodrine  5 mg Oral TID WC   multivitamin with minerals  1 tablet Oral Daily   pantoprazole  40 mg Oral Daily   rosuvastatin  5 mg Oral Daily   Continuous Infusions:  sodium chloride 100 mL/hr at 10/29/23 0321   lactated ringers Stopped (10/29/23 0320)     LOS: 1 day    Time spent: 56 minutes spent on chart review, discussion with nursing staff, consultants, updating family and interview/physical exam; more than 50% of that time was spent in counseling and/or coordination of care.    Alvira Philips [place], DO [facility] Available via Epic secure chat 7am-7pm After these hours, please refer to coverage provider listed on [email or web address] 10/29/2023, 9:55 AM

## 2023-10-29 NOTE — Assessment & Plan Note (Signed)
-   We will continue colchicine that should help with inflammatory response.

## 2023-10-29 NOTE — Assessment & Plan Note (Addendum)
-   The patient has chest pain with elevated troponin I. - It is highly likely to be acute myocarditis.  Will need to rule out acute coronary syndrome. - He will be admitted to a progressive unit bed at [facility]. - Cardiology service was notified and is aware about the patient.  They stated they will consult in AM.  They can be called then. - There was no recommendation for IV heparin. - We will utilize as needed IV Toradol for suspected myocarditis. - We will continue aspirin, statin therapy and follow serial cardiac enzymes.

## 2023-10-29 NOTE — H&P (Addendum)
Don [name]   PATIENT NAME: Don [name]    MR#:  [id number]  DATE OF BIRTH:  [birth year]  DATE OF ADMISSION:  10/28/2023  PRIMARY CARE PHYSICIAN: [facility]   Patient is coming from: Home  REQUESTING/REFERRING PHYSICIAN: Eber Hong, MD  CHIEF COMPLAINT:   Chief Complaint  Patient presents with   Chest Pain    HISTORY OF PRESENT ILLNESS:  Don [name] is a 78 y.o. male with medical history significant for chronic systolic heart failure and cardiomyopathy with EF of 20 to 25%, stage III chronic kidney disease, liver cirrhosis, essential hypertension, gout, hepatitis C and dyslipidemia, who presented to the emergency room with acute onset of substernal chest and upper abdominal pain mild in intensity with no radiation or nausea or vomiting or diaphoresis.  He denied any associated dyspnea or cough or wheezing.  He had a temperature that was up to 100.1 without chills.  He denied any recent URIs or bronchitis.  No dysuria, oliguria or hematuria or flank pain.  No bleeding diathesis.  He was pain-free during my interview.  ED Course: When the came to the ER, BP was 151/77 with respiratory rate of 24 and temperature 99.6.  Labs revealed albumin of 2.3 and total bili of 6.1 with otherwise unremarkable CMP.  High sensitive troponin I was 77 and later 597.  Lactic acid was 1.2 and later 1.  CBC showed anemia with hemoglobin 8.3 hematocrit 25.8 compared to 7.9 and 24.1 recently.  INR is 1.3 and PT 16.1.  Respiratory panel came back negative.  UA was unremarkable.  Blood culture was drawn.  EKG as reviewed by me : EKG showed supraventricular tachycardia with rate of 158 and PVCs and repolarization abnormality. Imaging: Portable chest x-ray showed pleural parenchymal scarring in the left base with no active disease.  The patient was given IV cefepime and vancomycin, 650 mg p.o. Tylenol and 1 L bolus of IV normal saline.  He will be admitted to a progressive unit bed for  further evaluation and management. PAST MEDICAL HISTORY:   Past Medical History:  Diagnosis Date   Aortic atherosclerosis (HCC)    Cardiomyopathy (HCC)    a. EF 20 to 25% by echo in 03/2019.   Cerebrovascular disease    Chronic HFrEF (heart failure with reduced ejection fraction) (HCC)    Chronic kidney disease, stage 3a (HCC)    Cirrhosis (HCC)    Essential hypertension    Gout    Hepatitis C    Hyperlipidemia     PAST SURGICAL HISTORY:   Past Surgical History:  Procedure Laterality Date   HERNIA REPAIR Left     SOCIAL HISTORY:   Social History   Tobacco Use   Smoking status: Former    Current packs/day: 0.00    Types: Cigarettes    Quit date: 09/29/2008    Years since quitting: 15.0   Smokeless tobacco: Never   Tobacco comments:    years ago  Substance Use Topics   Alcohol use: No    Alcohol/week: 0.0 standard drinks of alcohol    Comment: none in several years.     FAMILY HISTORY:   Family History  Problem Relation Age of Onset   Hypertension Sister     DRUG ALLERGIES:   Allergies  Allergen Reactions   Penicillins Swelling    REVIEW OF SYSTEMS:   ROS As per history of present illness. All pertinent systems were reviewed above. Constitutional, HEENT, cardiovascular, respiratory,  GI, GU, musculoskeletal, neuro, psychiatric, endocrine, integumentary and hematologic systems were reviewed and are otherwise negative/unremarkable except for positive findings mentioned above in the HPI.   MEDICATIONS AT HOME:   Prior to Admission medications   Medication Sig Start Date End Date Taking? Authorizing Provider  acetaminophen (TYLENOL) 650 MG CR tablet Take 650 mg by mouth every 4 (four) hours as needed (general discomfort, elevated temp).    [provider]  albuterol (PROVENTIL) (2.5 MG/3ML) 0.083% nebulizer solution Take 3 mLs (2.5 mg total) by nebulization every 4 (four) hours as needed for wheezing or shortness of breath. 10/16/23   Johnson,  Clanford L, MD  albuterol (VENTOLIN HFA) 108 (90 Base) MCG/ACT inhaler Inhale 2 puffs into the lungs every 4 (four) hours as needed for wheezing or shortness of breath. 08/20/23   [provider]  allopurinol (ZYLOPRIM) 100 MG tablet Take 1.5 tablets (150 mg total) by mouth daily. 08/04/23 08/03/24  Vassie Loll, MD  ascorbic acid (VITAMIN C) 500 MG tablet Take 1 tablet (500 mg total) by mouth daily with breakfast. 10/17/23   Cleora Fleet, MD  aspirin EC 81 MG tablet Take 81 mg by mouth daily.    [provider]  calcitRIOL (ROCALTROL) 0.25 MCG capsule Take 0.25 mcg by mouth daily.    [provider]  cholecalciferol (VITAMIN D3) 25 MCG (1000 UNIT) tablet Take 1,000 Units by mouth daily.    [provider]  colchicine 0.6 MG tablet Take 1.2 mg by mouth every 8 (eight) hours as needed (gout flare). 08/13/23   [provider]  cyanocobalamin (VITAMIN B12) 1000 MCG/ML injection Inject 1 mL (1,000 mcg total) into the muscle See admin instructions. Inject into the muscle twice weekly on Monday, Friday for 4 weeks. Then inject once every 30 days starting on 12/08/23. 10/16/23   Cleora Fleet, MD  feeding supplement (ENSURE ENLIVE / ENSURE PLUS) LIQD Take 237 mLs by mouth 2 (two) times daily between meals. 10/16/23   Johnson, Clanford L, MD  ferrous sulfate 325 (65 FE) MG tablet Take 1 tablet (325 mg total) by mouth daily with breakfast. 10/17/23   Johnson, Clanford L, MD  midodrine (PROAMATINE) 5 MG tablet Take 1 tablet (5 mg total) by mouth 3 (three) times daily with meals. 10/16/23   Cleora Fleet, MD  Multiple Vitamin (MULTIVITAMIN WITH MINERALS) TABS tablet Take 1 tablet by mouth daily. 10/17/23   Johnson, Clanford L, MD  ondansetron (ZOFRAN) 4 MG tablet Take 1 tablet (4 mg total) by mouth every 6 (six) hours. Patient taking differently: Take 4 mg by mouth every 6 (six) hours as needed for nausea or vomiting. 07/24/23   Carmel Sacramento A, PA-C  pantoprazole (PROTONIX) 40 MG tablet Take 1 tablet (40 mg total) by mouth daily. 08/05/23   Vassie Loll, MD  rosuvastatin (CRESTOR) 5 MG tablet Take 5 mg by mouth daily.    [provider]  Tiotropium Bromide Monohydrate (SPIRIVA RESPIMAT) 2.5 MCG/ACT AERS Inhale 2 puffs into the lungs daily.    [provider]      VITAL SIGNS:  Blood pressure 134/70, pulse 74, temperature 99.6 F (37.6 C), resp. rate (!) 22, height 6' (1.829 m), weight 75 kg, SpO2 97%.  PHYSICAL EXAMINATION:  Physical Exam  GENERAL:  78 y.o.-year-old Caucasian male patient lying in the bed with no acute distress.  EYES: Pupils equal, round, reactive to light and accommodation. No scleral icterus. Extraocular muscles intact.  HEENT: Head atraumatic,  normocephalic. Oropharynx and nasopharynx clear.  NECK:  Supple, no jugular venous distention. No thyroid enlargement, no tenderness.  LUNGS: Normal breath sounds bilaterally, no wheezing, rales,rhonchi or crepitation. No use of accessory muscles of respiration.  CARDIOVASCULAR: Regular rate and rhythm, S1, S2 normal. No murmurs, rubs, or gallops.  ABDOMEN: Soft, nondistended, nontender. Bowel sounds present. No organomegaly or mass.  EXTREMITIES: No pedal edema, cyanosis, or clubbing.  NEUROLOGIC: Cranial nerves II through XII are intact. Muscle strength 5/5 in all extremities. Sensation intact. Gait not checked.  PSYCHIATRIC: The patient is alert and oriented x 3.  Normal affect and good eye contact. SKIN: No obvious rash, lesion, or ulcer.   LABORATORY PANEL:   CBC Recent Labs  Lab 10/28/23 1933  WBC 9.7  HGB 8.3*  HCT 25.8*  PLT 381   ------------------------------------------------------------------------------------------------------------------  Chemistries  Recent Labs  Lab 10/28/23 1933  NA 136  K 3.7  CL 101  CO2 26  GLUCOSE 98  BUN 12  CREATININE 0.90  CALCIUM 8.7*  AST 33  ALT 24  ALKPHOS 122  BILITOT  0.7   ------------------------------------------------------------------------------------------------------------------  Cardiac Enzymes No results for input(s): "TROPONINI" in the last 168 hours. ------------------------------------------------------------------------------------------------------------------  RADIOLOGY:  DG Chest Port 1 View Result Date: 10/28/2023 CLINICAL DATA:  Chest pain and fever EXAM: PORTABLE CHEST 1 VIEW COMPARISON:  10/13/2023, 09/14/2023, 07/19/2022 FINDINGS: Pleuroparenchymal scarring at the left base. Borderline to mild cardiomegaly. No acute airspace disease or pneumothorax. IMPRESSION: No active disease. Pleuroparenchymal scarring at the left base. Electronically Signed   By: Jasmine Pang M.D.   On: 10/28/2023 19:48      IMPRESSION AND PLAN:  Assessment and Plan: * Acute myocarditis - The patient has chest pain with elevated troponin I. - It is highly likely to be acute myocarditis.  Will need to rule out acute coronary syndrome. - He will be admitted to a progressive unit bed at [facility]. - Cardiology service was notified and is aware about the patient.  They stated they will consult in AM.  They can be called then. - There was no recommendation for IV heparin. - We will utilize as needed IV Toradol for suspected myocarditis. - We will continue aspirin, statin therapy and follow serial cardiac enzymes.  Dyslipidemia - We will continue statin therapy.  GERD without esophagitis - We will continue PPI therapy.  Gout - We will continue colchicine that should help with inflammatory response.   DVT prophylaxis: Lovenox.  Advanced Care Planning:  Code Status: full code.  Family Communication:  The plan of care was discussed in details with the patient (and family). I answered all questions. The patient agreed to proceed with the above mentioned plan. Further management will depend upon hospital course. Disposition Plan: Back to previous home  environment Consults called: Cardiology consult can be called in AM.  Notification was done All the records are reviewed and case discussed with ED provider.  Status is: Inpatient   At the time of the admission, it appears that the appropriate admission status for this patient is inpatient.  This is judged to be reasonable and necessary in order to provide the required intensity of service to ensure the patient's safety given the presenting symptoms, physical exam findings and initial radiographic and laboratory data in the context of comorbid conditions.  The patient requires inpatient status due to high intensity of service, high risk of further deterioration and high frequency of surveillance required.  I certify that at the time of admission, it is  my clinical judgment that the patient will require inpatient hospital care extending more than 2 midnights.                            Dispo: The patient is from: Home              Anticipated d/c is to: Home              Patient currently is not medically stable to d/c.              Difficult to place patient: No  Hannah Beat M.D on 10/29/2023 at 2:33 AM  [facility]   From 7 PM-7 AM, contact night-coverage [email or web address]  CC: Primary care physician; [facility]

## 2023-10-29 NOTE — Assessment & Plan Note (Signed)
 -  We will continue statin therapy.

## 2023-10-30 DIAGNOSIS — R7989 Other specified abnormal findings of blood chemistry: Secondary | ICD-10-CM

## 2023-10-30 DIAGNOSIS — I409 Acute myocarditis, unspecified: Secondary | ICD-10-CM | POA: Diagnosis not present

## 2023-10-30 LAB — COMPREHENSIVE METABOLIC PANEL
ALT: 21 U/L (ref 0–44)
AST: 27 U/L (ref 15–41)
Albumin: 2.3 g/dL — ABNORMAL LOW (ref 3.5–5.0)
Alkaline Phosphatase: 126 U/L (ref 38–126)
Anion gap: 13 (ref 5–15)
BUN: 9 mg/dL (ref 8–23)
CO2: 22 mmol/L (ref 22–32)
Calcium: 9 mg/dL (ref 8.9–10.3)
Chloride: 102 mmol/L (ref 98–111)
Creatinine, Ser: 0.79 mg/dL (ref 0.61–1.24)
GFR, Estimated: >60 mL/min (ref >60)
Glucose, Bld: 87 mg/dL (ref 70–99)
Potassium: 3.9 mmol/L (ref 3.5–5.1)
Sodium: 137 mmol/L (ref 135–145)
Total Bilirubin: 1.1 mg/dL (ref 0.0–1.2)
Total Protein: 6.4 g/dL — ABNORMAL LOW (ref 6.5–8.1)

## 2023-10-30 LAB — IRON AND TIBC
Iron: 16 ug/dL — ABNORMAL LOW (ref 45–182)
Saturation Ratios: 8 % — ABNORMAL LOW (ref 17.9–39.5)
TIBC: 191 ug/dL — ABNORMAL LOW (ref 250–450)
UIBC: 175 ug/dL

## 2023-10-30 LAB — MAGNESIUM: Magnesium: 1.5 mg/dL — ABNORMAL LOW (ref 1.7–2.4)

## 2023-10-30 LAB — CBC
HCT: 29.7 % — ABNORMAL LOW (ref 39.0–52.0)
Hemoglobin: 9.2 g/dL — ABNORMAL LOW (ref 13.0–17.0)
MCH: 29 pg (ref 26.0–34.0)
MCHC: 31 g/dL (ref 30.0–36.0)
MCV: 93.7 fL (ref 80.0–100.0)
Platelets: 361 10*3/uL (ref 150–400)
RBC: 3.17 MIL/uL — ABNORMAL LOW (ref 4.22–5.81)
RDW: 15.6 % — ABNORMAL HIGH (ref 11.5–15.5)
WBC: 9.9 10*3/uL (ref 4.0–10.5)
nRBC: 0 % (ref 0.0–0.2)

## 2023-10-30 LAB — T4, FREE: Free T4: 1.11 ng/dL (ref 0.61–1.12)

## 2023-10-30 LAB — BRAIN NATRIURETIC PEPTIDE: B Natriuretic Peptide: 1581 pg/mL — ABNORMAL HIGH (ref 0.0–100.0)

## 2023-10-30 LAB — C-REACTIVE PROTEIN: CRP: 15.5 mg/dL — ABNORMAL HIGH (ref <1.0)

## 2023-10-30 LAB — SEDIMENTATION RATE: Sed Rate: 99 mm/h — ABNORMAL HIGH (ref 0–16)

## 2023-10-30 LAB — TSH: TSH: 5.293 u[IU]/mL — ABNORMAL HIGH (ref 0.350–4.500)

## 2023-10-30 LAB — ANA W/REFLEX IF POSITIVE: Anti Nuclear Antibody (ANA): NEGATIVE

## 2023-10-30 LAB — FERRITIN: Ferritin: 180 ng/mL (ref 24–336)

## 2023-10-30 MED ORDER — MAGNESIUM SULFATE 4 GM/100ML IV SOLN
4.0000 g | Freq: Once | INTRAVENOUS | Status: AC
  Administered 2023-10-30: 4 g via INTRAVENOUS
  Filled 2023-10-30: qty 100

## 2023-10-30 MED ORDER — CARVEDILOL 6.25 MG PO TABS
6.2500 mg | ORAL_TABLET | Freq: Two times a day (BID) | ORAL | Status: DC
  Administered 2023-10-30 – 2023-11-09 (×21): 6.25 mg via ORAL
  Filled 2023-10-30 (×21): qty 1

## 2023-10-30 NOTE — ED Notes (Signed)
Report given to [facility] on 6E at [facility]. No further questions at this time, awaiting bed to be clean in order to call [facility].

## 2023-10-30 NOTE — ED Notes (Signed)
Dr [place] aware of magnesium

## 2023-10-30 NOTE — Progress Notes (Signed)
 PROGRESS NOTE    Don [name]  FMW:[id number] DOB: 1946-05-04 DOA: 10/28/2023 PCP: [facility]    Brief Narrative:   Don [name] is a 78 y.o. male with past medical history significant for chronic systolic congestive heart failure (LVEF 20-25%), CKD stage IIIa, HTN, hyperlipidemia, cirrhosis, gout, hepatitis C who presented to [facility] ED on 2/2 via EMS from [facility] with complaints of chest pain and fever.  Patient reports acute onset substernal chest pain without radiation.  Patient states was there for some time and unable to quantify actual duration but subsided without any specific intervention.  Denies any recent sick contacts, no changes in his medications although resides at a SNF.  Further denied any nausea, no vomiting/diarrhea, no diaphoresis, no shortness of breath, no cough, no chills, no urinary symptoms, no headache, no visual disturbance.  In the ED, temperature 101.8 F, HR 160, RR 20, BP 175/105, SpO2 95% on room air.  WBC 9.7, hemoglobin 8.3, platelet count 381.  Sodium 136, potassium 3.7, chloride 101, CO2 26, glucose 98, BUN 12, creatinine 0.90.  AST 33, ALT 24, total bilirubin 0.7.  High-sensitivity troponin 77>597.  Lactic acid 1.2.  COVID/influenza/RSV PCR negative.  Urinalysis unrevealing.  Chest x-ray with no acute cardiopulmonary disease process.  Blood cultures x 2 obtained.  EKG notable for supraventricular tachycardia, rate 158.  Patient was given IV cefepime /vancomycin , Tylenol  650 mg p.o. x 1, 1 L NS bolus.  Cardiology was consulted by EDP.  TRH was consulted for admission for concern of acute myocarditis.  Assessment & Plan:   Acute myocarditis Patient presenting with chest pain, localized to the substernal region lasting for unknown amount of time that self resolved.  Denies radiation.  Troponin initially elevated 77 followed by 597.  EKG with SVT as below.  Repeat EKG with normal sinus rhythm, ventricular bigeminy, rate 72  with T wave inversions 2, 3, aVF which is a change since 10/15/2023.  Influenza/RSV/COVID PCR negative.  Procalcitonin less than 0.10; unlikely infectious etiology. -- Cardiology consulted by EDP recommended transfer to [facility] for further evaluation and management -- Troponin 77>597>1084>779>580 -- ESR elevated 85>99 -- CRP: 14.9>15.5 -- Respiratory viral panel: Negative -- ANA with reflex: Pending -- Iron, TIBC, ferritin: Pending -- SPEP: pending -- Pending transfer to [facility] for evaluation by cardiology service, may need cardiac MRI, coronary angiography -- Continue monitor on telemetry -- Aspirin , statin, IV Toradol  as needed  Supraventricular tachycardia: Resolved On arrival, patient's heart rate elevated at 160, EKG with supraventricular tachycardia with rate 158, converted back to NSR following treatment of fever with Tylenol  and 1 L NS bolus. -- Increase carvedilol  to 6.25 mg BID -- Continue monitor on telemetry  Fever: Resolved 101.8 F on admission.  WBC count 9.7, lactic acid 1.2.  Chest x-ray with no focal consolidation.  Urinalysis unrevealing.  Procalcitonin less than 0.10.  COVID/influenza/RSV PCR negative.  No infectious source found, did receive 1 dose IV vancomycin /cefepime  by EDP.  Suspect fever possibly due to acute myocarditis as above. -- Blood cultures x 2: no growth x 2 days -- Respiratory viral panel: Negative -- Will hold further antibiotics at this time -- Repeat CBC in a.m.  Elevated TSH TSH 5.293 -- Check free T4  Chronic systolic congestive heart failure Essential hypertension -- TTE: LVEF 55-6%, LV with no regional wall motion normalities, mild concentric LVH, LA moderately dilated. -- Carvedilol  3.125 mg p.o. twice daily -- Continue aspirin  and statin --  Continue monitor on telemetry  Hyperlipidemia -- Crestor  5 mg p.o. daily  CKD stage IIIa -- Cr 0.90>0.79; stable.  Cirrhosis -- Low-salt diet  Gout -- Allopurinol  150 mg  p.o. daily -- Colchicine  as needed  History of hepatitis C Outpatient follow-up with gastroenterology/infectious disease.  GERD -- Protonix  40 mg p.o. daily  DVT prophylaxis: enoxaparin  (LOVENOX ) injection 40 mg Start: 10/29/23 1000    Code Status: Full Code Family Communication: No family present at bedside  Disposition Plan:  Level of care: Progressive Status is: Inpatient Remains inpatient appropriate because: Pending transfer to [facility]    Consultants:  Cardiology  Procedures:  TTE:   Antimicrobials:  Vancomycin  2/2 - 2/2 Cefepime  2/2 - 2/2   Subjective: Patient seen examined bedside, resting comfortably.  Lying in bed.  Remains in ED holding area waiting for transfer to [facility].  Continues to deny chest pain.  No questions, concerns or complaints this morning.  Further denies headache, no dizziness, no chest pain, no palpitations, no shortness of breath, no abdominal pain, no fever/chills/night sweats, no nausea/vomiting/diarrhea, no focal weakness, no abdominal pain, no fatigue, no paresthesias.  No acute events overnight per nurse staff.  Objective: Vitals:   10/30/23 0620 10/30/23 0731 10/30/23 0817 10/30/23 0900  BP:    (!) 149/102  Pulse: 92   98  Resp: 20   (!) 22  Temp:  98.8 F (37.1 C)    TempSrc:  Oral    SpO2: 96%  97% 95%  Weight:      Height:        Intake/Output Summary (Last 24 hours) at 10/30/2023 1023 Last data filed at 10/29/2023 2122 Gross per 24 hour  Intake --  Output 300 ml  Net -300 ml   Filed Weights   10/28/23 1856  Weight: 75 kg    Examination:  Physical Exam: GEN: NAD, alert and oriented x 3, chronically ill appearance, appears older than stated age HEENT: NCAT, PERRL, EOMI, sclera clear, MMM PULM: CTAB w/o wheezes/crackles, normal respiratory effort, on room air CV: RRR w/o M/G/R GI: abd soft, NTND, NABS, no R/G/M MSK: no peripheral edema, muscle strength globally intact 5/5 bilateral upper/lower  extremities NEURO: CN II-XII intact, no focal deficits, sensation to light touch intact PSYCH: normal mood/affect Integumentary: No concerning rashes/lesions/wounds noted on exposed skin surfaces    Data Reviewed: I have personally reviewed following labs and imaging studies  CBC: Recent Labs  Lab 10/28/23 1933 10/29/23 0516 10/30/23 0304  WBC 9.7 9.8 9.9  NEUTROABS 7.6  --   --   HGB 8.3* 9.2* 9.2*  HCT 25.8* 29.7* 29.7*  MCV 94.9 99.3 93.7  PLT 381 375 361   Basic Metabolic Panel: Recent Labs  Lab 10/28/23 1933 10/29/23 0516 10/30/23 0304  NA 136 138 137  K 3.7 3.9 3.9  CL 101 104 102  CO2 26 24 22   GLUCOSE 98 96 87  BUN 12 10 9   CREATININE 0.90 0.79 0.79  CALCIUM  8.7* 8.9 9.0  MG  --   --  1.5*   GFR: Estimated Creatinine Clearance: 82 mL/min (by C-G formula based on SCr of 0.79 mg/dL). Liver Function Tests: Recent Labs  Lab 10/28/23 1933 10/30/23 0304  AST 33 27  ALT 24 21  ALKPHOS 122 126  BILITOT 0.7 1.1  PROT 6.1* 6.4*  ALBUMIN  2.3* 2.3*   No results for input(s): LIPASE, AMYLASE in the last 168 hours. No results for input(s): AMMONIA in the last 168 hours.  Coagulation Profile: Recent Labs  Lab 10/28/23 1933  INR 1.3*   Cardiac Enzymes: No results for input(s): CKTOTAL, CKMB, CKMBINDEX, TROPONINI in the last 168 hours. BNP (last 3 results) No results for input(s): PROBNP in the last 8760 hours. HbA1C: No results for input(s): HGBA1C in the last 72 hours. CBG: No results for input(s): GLUCAP in the last 168 hours. Lipid Profile: Recent Labs    10/29/23 0516  CHOL 94  HDL 41  LDLCALC 40  TRIG 67  CHOLHDL 2.3   Thyroid  Function Tests: Recent Labs    10/30/23 0304  TSH 5.293*   Anemia Panel: No results for input(s): VITAMINB12, FOLATE, FERRITIN, TIBC, IRON, RETICCTPCT in the last 72 hours. Sepsis Labs: Recent Labs  Lab 10/28/23 1933 10/28/23 2137 10/29/23 0516  PROCALCITON  --   --  <0.10   LATICACIDVEN 1.2 1.0  --     Recent Results (from the past 240 hours)  Resp panel by RT-PCR (RSV, Flu A&B, Covid) Anterior Nasal Swab     Status: None   Collection Time: 10/28/23  7:22 PM   Specimen: Anterior Nasal Swab  Result Value Ref Range Status   SARS Coronavirus 2 by RT PCR NEGATIVE NEGATIVE Final    Comment: (NOTE) SARS-CoV-2 target nucleic acids are NOT DETECTED.  The SARS-CoV-2 RNA is generally detectable in upper respiratory specimens during the acute phase of infection. The lowest concentration of SARS-CoV-2 viral copies this assay can detect is 138 copies/mL. A negative result does not preclude SARS-Cov-2 infection and should not be used as the sole basis for treatment or other patient management decisions. A negative result may occur with  improper specimen collection/handling, submission of specimen other than nasopharyngeal swab, presence of viral mutation(s) within the areas targeted by this assay, and inadequate number of viral copies(<138 copies/mL). A negative result must be combined with clinical observations, patient history, and epidemiological information. The expected result is Negative.  Fact Sheet for Patients:  bloggercourse.com  Fact Sheet for Healthcare Providers:  seriousbroker.it  This test is no t yet approved or cleared by the [place]  FDA and  has been authorized for detection and/or diagnosis of SARS-CoV-2 by FDA under an Emergency Use Authorization (EUA). This EUA will remain  in effect (meaning this test can be used) for the duration of the COVID-19 declaration under Section 564(b)(1) of the Act, 21 U.S.C.section 360bbb-3(b)(1), unless the authorization is terminated  or revoked sooner.       Influenza A by PCR NEGATIVE NEGATIVE Final   Influenza B by PCR NEGATIVE NEGATIVE Final    Comment: (NOTE) The Xpert Xpress SARS-CoV-2/FLU/RSV plus assay is intended as an aid in the  diagnosis of influenza from Nasopharyngeal swab specimens and should not be used as a sole basis for treatment. Nasal washings and aspirates are unacceptable for Xpert Xpress SARS-CoV-2/FLU/RSV testing.  Fact Sheet for Patients: bloggercourse.com  Fact Sheet for Healthcare Providers: seriousbroker.it  This test is not yet approved or cleared by the [place]  FDA and has been authorized for detection and/or diagnosis of SARS-CoV-2 by FDA under an Emergency Use Authorization (EUA). This EUA will remain in effect (meaning this test can be used) for the duration of the COVID-19 declaration under Section 564(b)(1) of the Act, 21 U.S.C. section 360bbb-3(b)(1), unless the authorization is terminated or revoked.     Resp Syncytial Virus by PCR NEGATIVE NEGATIVE Final    Comment: (NOTE) Fact Sheet for Patients: bloggercourse.com  Fact Sheet for Healthcare Providers: seriousbroker.it  This  test is not yet approved or cleared by the [place]  FDA and has been authorized for detection and/or diagnosis of SARS-CoV-2 by FDA under an Emergency Use Authorization (EUA). This EUA will remain in effect (meaning this test can be used) for the duration of the COVID-19 declaration under Section 564(b)(1) of the Act, 21 U.S.C. section 360bbb-3(b)(1), unless the authorization is terminated or revoked.  Performed at [facility], [address] [place], [place] [address]   Blood Culture (routine x 2)     Status: None (Preliminary result)   Collection Time: 10/28/23  7:33 PM   Specimen: Left Antecubital; Blood  Result Value Ref Range Status   Specimen Description LEFT ANTECUBITAL BLOOD  Final   Special Requests   Final    BOTTLES DRAWN AEROBIC AND ANAEROBIC Blood Culture adequate volume   Culture   Final    NO GROWTH 2 DAYS Performed at [facility], [address] [place], [place]  [address]    Report Status PENDING  Incomplete  Blood Culture (routine x 2)     Status: None (Preliminary result)   Collection Time: 10/28/23  7:43 PM   Specimen: BLOOD RIGHT FOREARM  Result Value Ref Range Status   Specimen Description BLOOD RIGHT FOREARM BLOOD  Final   Special Requests   Final    BOTTLES DRAWN AEROBIC AND ANAEROBIC Blood Culture adequate volume   Culture   Final    NO GROWTH 2 DAYS Performed at [facility], [address], [place], [place] [address]    Report Status PENDING  Incomplete  Respiratory (~20 pathogens) panel by PCR     Status: None   Collection Time: 10/29/23  5:20 PM   Specimen: Nasopharyngeal Swab; Respiratory  Result Value Ref Range Status   Adenovirus NOT DETECTED NOT DETECTED Final   Coronavirus 229E NOT DETECTED NOT DETECTED Final    Comment: (NOTE) The Coronavirus on the Respiratory Panel, DOES NOT test for the novel  Coronavirus (2019 nCoV)    Coronavirus HKU1 NOT DETECTED NOT DETECTED Final   Coronavirus NL63 NOT DETECTED NOT DETECTED Final   Coronavirus OC43 NOT DETECTED NOT DETECTED Final   Metapneumovirus NOT DETECTED NOT DETECTED Final   Rhinovirus / Enterovirus NOT DETECTED NOT DETECTED Final   Influenza A NOT DETECTED NOT DETECTED Final   Influenza B NOT DETECTED NOT DETECTED Final   Parainfluenza Virus 1 NOT DETECTED NOT DETECTED Final   Parainfluenza Virus 2 NOT DETECTED NOT DETECTED Final   Parainfluenza Virus 3 NOT DETECTED NOT DETECTED Final   Parainfluenza Virus 4 NOT DETECTED NOT DETECTED Final   Respiratory Syncytial Virus NOT DETECTED NOT DETECTED Final   Bordetella pertussis NOT DETECTED NOT DETECTED Final   Bordetella Parapertussis NOT DETECTED NOT DETECTED Final   Chlamydophila pneumoniae NOT DETECTED NOT DETECTED Final   Mycoplasma pneumoniae NOT DETECTED NOT DETECTED Final    Comment: Performed at [facility], [address], [place], [place] [address]         Radiology Studies: ECHOCARDIOGRAM  LIMITED Result Date: 10/29/2023    ECHOCARDIOGRAM LIMITED REPORT   Patient Name:   Don [name] Date of Exam: 10/29/2023 Medical Rec #:  [id number]       Height:       72.0 in Accession #:    [id number]      Weight:       165.3 lb Date of Birth:  07-18-1946       BSA:  1.965 m Patient Age:    77 years        BP:           176/89 mmHg Patient Gender: M               HR:           90 bpm. Exam Location:  [facility] Procedure: Limited Echo Indications:    LVEF  History:        Patient has prior history of Echocardiogram examinations, most                 recent 09/29/2023. CHF and Cardiomyopathy; Risk                 Factors:Hypertension.  Sonographer:    Jayson Gaskins Referring Phys: [id number] Marko Skalski J Sender Rueb IMPRESSIONS  1. No doppler analysis done.  2. Left ventricular ejection fraction, by estimation, is 55 to 60%. The left ventricle has normal function. The left ventricle has no regional wall motion abnormalities. There is mild concentric left ventricular hypertrophy.  3. Right ventricular systolic function is normal. The right ventricular size is normal.  4. Left atrial size was moderately dilated.  5. The mitral valve is grossly normal.  6. The aortic valve is tricuspid. No aortic stenosis is present.  7. The inferior vena cava is normal in size with greater than 50% respiratory variability, suggesting right atrial pressure of 3 mmHg. Comparison(s): The left ventricular function is unchanged. FINDINGS  Left Ventricle: Left ventricular ejection fraction, by estimation, is 55 to 60%. The left ventricle has normal function. The left ventricle has no regional wall motion abnormalities. The left ventricular internal cavity size was normal in size. There is  mild concentric left ventricular hypertrophy. Right Ventricle: The right ventricular size is normal. Right vetricular wall thickness was not assessed. Right ventricular systolic function is normal. Left Atrium: Left atrial size was moderately dilated. Right  Atrium: Right atrial size was normal in size. Pericardium: There is no evidence of pericardial effusion. Mitral Valve: The mitral valve is grossly normal. Tricuspid Valve: The tricuspid valve is grossly normal. Aortic Valve: The aortic valve is tricuspid. No aortic stenosis is present. Pulmonic Valve: The pulmonic valve was grossly normal. Venous: The inferior vena cava is normal in size with greater than 50% respiratory variability, suggesting right atrial pressure of 3 mmHg. IAS/Shunts: The interatrial septum was not assessed. LEFT VENTRICLE PLAX 2D LVIDd:         4.80 cm LVIDs:         3.30 cm LV PW:         1.30 cm LV IVS:        1.20 cm  LEFT ATRIUM             Index LA Vol (A2C):   72.6 ml 36.95 ml/m LA Vol (A4C):   75.8 ml 38.57 ml/m LA Biplane Vol: 74.9 ml 38.12 ml/m Vina Gull MD Electronically signed by Vina Gull MD Signature Date/Time: 10/29/2023/3:30:59 PM    Final    DG Chest Port 1 View Result Date: 10/28/2023 CLINICAL DATA:  Chest pain and fever EXAM: PORTABLE CHEST 1 VIEW COMPARISON:  10/13/2023, 09/14/2023, 07/19/2022 FINDINGS: Pleuroparenchymal scarring at the left base. Borderline to mild cardiomegaly. No acute airspace disease or pneumothorax. IMPRESSION: No active disease. Pleuroparenchymal scarring at the left base. Electronically Signed   By: Luke Bun M.D.   On: 10/28/2023 19:48        Scheduled Meds:  allopurinol   150 mg Oral Daily   ascorbic acid   500 mg Oral Q breakfast   aspirin  EC  81 mg Oral Daily   calcitRIOL   0.25 mcg Oral Daily   carvedilol   3.125 mg Oral BID WC   cholecalciferol   1,000 Units Oral Daily   cyanocobalamin   1,000 mcg Intramuscular Once per day on Monday Friday   enoxaparin  (LOVENOX ) injection  40 mg Subcutaneous Q24H   feeding supplement  237 mL Oral BID BM   ferrous sulfate   325 mg Oral Q breakfast   multivitamin with minerals  1 tablet Oral Daily   pantoprazole   40 mg Oral Daily   rosuvastatin   5 mg Oral Daily   umeclidinium bromide   1  puff Inhalation Daily   Continuous Infusions:  magnesium  sulfate bolus IVPB       LOS: 2 days    Time spent: 56 minutes spent on chart review, discussion with nursing staff, consultants, updating family and interview/physical exam; more than 50% of that time was spent in counseling and/or coordination of care.    Camellia PARAS Sanchez Hemmer, DO [facility] Available via Epic secure chat 7am-7pm After these hours, please refer to coverage provider listed on [email or web address] 10/30/2023, 10:23 AM

## 2023-10-30 NOTE — Consult Note (Addendum)
 Cardiology Consultation   Patient ID: Don [name] MRN: [id number]; DOB: 1945-10-16  Admit date: 10/28/2023 Date of Consult: 10/30/2023  PCP:  [facility]   [facility] Providers Cardiologist:  Jayson Sierras, MD        Patient Profile:   Don [name] is a 78 y.o. 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 Austria.  History of Present Illness:   [name] was recently admitted to [facility] from 1/3 - 10/04/2023 for evaluation of syncope and this was felt to be due to orthostasis as he had been on Lasix , Entresto  and beta-blocker therapy prior to admission. Repeat echocardiogram showed his EF was preserved at 55 to 60% with no regional wall motion abnormalities and normal RV function. He was restarted on Coreg  3.125 mg twice daily at discharge with Entresto  24-26 mg twice daily, Lasix  20 mg daily, spironolactone  25 mg daily and Jardiance 10 mg daily being discontinued. Was discharged to SNF.  He had a recurrent admission from 1/18 - 10/16/2023 for dehydration in the setting of poor oral intake and Coreg  was discontinued that admission due to hypotension and he required Midodrine  5 mg 3 times daily and this was continued at discharge.  He presented back to [facility] ED on 10/28/[facility] from SNF for evaluation of a fever and chest pain. He was initially in SVT upon arrival with heart rate in the 150's but received Tylenol  and 1 L fluid bolus for further turn to normal sinus rhythm. Initial labs showed WBC 9.7, Hgb 8.3, platelets 381, Na+ 136, K+ 3.7 and creatinine 0.90. Albumin  2.3. AST 33 and ALT 24  Lactic acid normal at 1.2 and 1.0. BNP elevated at 1224. Initial and repeat Hs Troponin values elevated at 77, 597, 1084, 779 and 580. CRP elevated at 14.9 and ESR at 85. Respiratory panel negative   Limited echocardiogram was obtained yesterday which shows a preserved EF of 55 to 60% with no regional wall motion abnormalities. No evidence of a pericardial effusion.  He has not been started on IV Heparin . Remains on Coreg  3.125 mg twice daily and Crestor  5 mg daily.  Toradol  was ordered as PRN but this has not been administered.  In talking with the patient today, he reports he had been having intermittent chest pain for a few days prior to admission but this has now resolved. Still having some shortness of breath and is unsure how long this has been occurring. He was febrile upon arrival to the ED but denies any subjective fever or chills since admission. He has been afebrile for the past 24 hours. No specific orthopnea, PND or pitting edema.  Reports his pain resolved upon the day of arrival and he denies any recurrent pain since.   Past Medical History:  Diagnosis Date   Aortic atherosclerosis (HCC)    Cardiomyopathy (HCC)    a. EF 20 to 25% by echo in 03/2019.   Cerebrovascular disease    Chronic HFrEF (heart failure with reduced ejection fraction) (HCC)    Chronic kidney disease, stage 3a (HCC)    Cirrhosis (HCC)    Essential hypertension    Gout    Hepatitis C    Hyperlipidemia     Past Surgical History:  Procedure Laterality Date   HERNIA REPAIR Left  Home Medications:  Prior to Admission medications   Medication Sig Start Date End Date Taking? Authorizing Provider  ACETAMINOPHEN  ER PO Take 325 mg by mouth every 4 (four) hours as needed (general discomfort, elevated temp).   Yes [provider]  albuterol  (PROVENTIL ) (2.5 MG/3ML) 0.083% nebulizer solution Take 3 mLs (2.5 mg total) by nebulization every 4 (four) hours as needed for wheezing or shortness of breath. 10/16/23  Yes Johnson, Clanford L, MD  albuterol  (VENTOLIN  HFA) 108 (90 Base) MCG/ACT inhaler Inhale 2 puffs into the lungs every 4 (four) hours as needed for wheezing or shortness of breath. 08/20/23   Yes [provider]  allopurinol  (ZYLOPRIM ) 100 MG tablet Take 1.5 tablets (150 mg total) by mouth daily. 08/04/23 08/03/24 Yes Ricky Fines, MD  ascorbic acid  (VITAMIN C ) 500 MG tablet Take 1 tablet (500 mg total) by mouth daily with breakfast. 10/17/23  Yes Johnson, Clanford L, MD  aspirin  EC 81 MG tablet Take 81 mg by mouth daily.   Yes [provider]  calcitRIOL  (ROCALTROL ) 0.25 MCG capsule Take 0.25 mcg by mouth daily.   Yes [provider]  carvedilol  (COREG ) 3.125 MG tablet Take 3.125 mg by mouth 2 (two) times daily with a meal.   Yes [provider]  cholecalciferol  (VITAMIN D3) 25 MCG (1000 UNIT) tablet Take 1,000 Units by mouth daily.   Yes [provider]  colchicine  0.6 MG tablet Take 1.2 mg by mouth every 8 (eight) hours as needed (gout flare). 08/13/23  Yes [provider]  ferrous sulfate  325 (65 FE) MG tablet Take 1 tablet (325 mg total) by mouth daily with breakfast. 10/17/23  Yes Johnson, Clanford L, MD  midodrine  (PROAMATINE ) 5 MG tablet Take 1 tablet (5 mg total) by mouth 3 (three) times daily with meals. 10/16/23  Yes Johnson, Clanford L, MD  Multiple Vitamin (MULTIVITAMIN WITH MINERALS) TABS tablet Take 1 tablet by mouth daily. 10/17/23  Yes Johnson, Clanford L, MD  ondansetron  (ZOFRAN ) 4 MG tablet Take 1 tablet (4 mg total) by mouth every 6 (six) hours. Patient taking differently: Take 4 mg by mouth every 6 (six) hours as needed for nausea or vomiting. 07/24/23  Yes Beatty, Celeste A, PA-C  pantoprazole  (PROTONIX ) 40 MG tablet Take 1 tablet (40 mg total) by mouth daily. 08/05/23  Yes Ricky Fines, MD  rosuvastatin  (CRESTOR ) 5 MG tablet Take 5 mg by mouth daily.   Yes [provider]  Tiotropium Bromide  Monohydrate (SPIRIVA  RESPIMAT) 2.5 MCG/ACT AERS Inhale 2 puffs into the lungs daily.   Yes [provider]  cyanocobalamin  (VITAMIN B12) 1000 MCG/ML injection Inject 1 mL (1,000 mcg total) into the muscle See  admin instructions. Inject 1000mcg into the muscle twice weekly on Monday, Friday for 4 weeks. Then inject 1000mcg once every 30 days starting on 12/08/23. 10/16/23   Vicci Afton CROME, MD  feeding supplement (ENSURE ENLIVE / ENSURE PLUS) LIQD Take 237 mLs by mouth 2 (two) times daily between meals. 10/16/23   Johnson, Clanford L, MD    Inpatient Medications: Scheduled Meds:  allopurinol   150 mg Oral Daily   ascorbic acid   500 mg Oral Q breakfast   aspirin  EC  81 mg Oral Daily   calcitRIOL   0.25 mcg Oral Daily   carvedilol   6.25 mg Oral BID WC   cholecalciferol   1,000 Units Oral Daily   cyanocobalamin   1,000 mcg Intramuscular Once per day on Monday Friday   enoxaparin  (LOVENOX ) injection  40 mg Subcutaneous Q24H  feeding supplement  237 mL Oral BID BM   ferrous sulfate   325 mg Oral Q breakfast   multivitamin with minerals  1 tablet Oral Daily   pantoprazole   40 mg Oral Daily   rosuvastatin   5 mg Oral Daily   umeclidinium bromide   1 puff Inhalation Daily   Continuous Infusions:  magnesium  sulfate bolus IVPB 4 g (10/30/23 1052)   PRN Meds: acetaminophen  **OR** acetaminophen , albuterol , colchicine , hydrALAZINE , ketorolac , magnesium  hydroxide, midodrine , morphine  injection, nitroGLYCERIN , ondansetron  **OR** ondansetron  (ZOFRAN ) IV, traZODone   Allergies:    Allergies  Allergen Reactions   Penicillins Swelling    Social History:   Social History   Socioeconomic History   Marital status: Single    Spouse name: Not on file   Number of children: Not on file   Years of education: Not on file   Highest education level: Not on file  Occupational History   Not on file  Tobacco Use   Smoking status: Former    Current packs/day: 0.00    Types: Cigarettes    Quit date: 09/29/2008    Years since quitting: 15.0   Smokeless tobacco: Never   Tobacco comments:    years ago  Vaping Use   Vaping status: Never Used  Substance and Sexual Activity   Alcohol use: No    Alcohol/week: 0.0  standard drinks of alcohol    Comment: none in several years.    Drug use: Not Currently    Comment: stopped 2-3 yrs ago   Sexual activity: Not on file  Other Topics Concern   Not on file  Social History Narrative   Not on file   Social Drivers of Health   Financial Resource Strain: Not on file  Food Insecurity: No Food Insecurity (10/14/2023)   Hunger Vital Sign    Worried About Running Out of Food in the Last Year: Never true    Ran Out of Food in the Last Year: Never true  Transportation Needs: No Transportation Needs (10/14/2023)   PRAPARE - Administrator, Civil Service (Medical): No    Lack of Transportation (Non-Medical): No  Physical Activity: Not on file  Stress: Not on file  Social Connections: Unknown (10/14/2023)   Social Connection and Isolation Panel [NHANES]    Frequency of Communication with Friends and Family: Patient declined    Frequency of Social Gatherings with Friends and Family: Once a week    Attends Religious Services: Patient declined    Database Administrator or Organizations: No    Attends Engineer, Structural: Patient declined    Marital Status: Never married  Intimate Partner Violence: Not At Risk (10/14/2023)   Humiliation, Afraid, Rape, and Kick questionnaire    Fear of Current or Ex-Partner: No    Emotionally Abused: No    Physically Abused: No    Sexually Abused: No    Family History:    Family History  Problem Relation Age of Onset   Hypertension Sister      ROS:  Please see the history of present illness.   All other ROS reviewed and negative.     Physical Exam/Data:   Vitals:   10/30/23 0620 10/30/23 0731 10/30/23 0817 10/30/23 0900  BP:    (!) 149/102  Pulse: 92   98  Resp: 20   (!) 22  Temp:  98.8 F (37.1 C)    TempSrc:  Oral    SpO2: 96%  97% 95%  Weight:  Height:        Intake/Output Summary (Last 24 hours) at 10/30/2023 1145 Last data filed at 10/29/2023 2122 Gross per 24 hour  Intake --   Output 300 ml  Net -300 ml      10/28/2023    6:56 PM 10/14/2023    6:14 AM 10/13/2023    7:58 PM  Last 3 Weights  Weight (lbs) 165 lb 5.5 oz 164 lb 14.5 oz 160 lb  Weight (kg) 75 kg 74.8 kg 72.576 kg     Body mass index is 22.42 kg/m.  General:  Well nourished, well developed male appearing in no acute distress. HEENT: normal Neck: no JVD Vascular: No carotid bruits; Distal pulses 2+ bilaterally Cardiac:  normal S1, S2; RRR; no murmur  Lungs:  clear to auscultation bilaterally, no wheezing, rhonchi or rales  Abd: soft, nontender, no hepatomegaly  Ext: 1+ pitting edema along RLE, trace edema along LLE.  Musculoskeletal:  No deformities, BUE and BLE strength normal and equal Skin: warm and dry  Neuro:  CNs 2-12 intact, no focal abnormalities noted Psych:  Normal affect   EKG:  The EKG was personally reviewed and demonstrates:  NSR, HR 72 with PVC's and in a bigeminal pattern. No acute ST changes.   Telemetry:  Telemetry was personally reviewed and demonstrates: Normal sinus rhythm, heart rate in 80's to 90's with frequent PVC's. He did have a brief narrow-complex tachycardia with HR in the 160's yesterday evening which lasted about 5 minutes then spontaneously resolved.   Relevant CV Studies:  Echocardiogram: 09/29/2023 IMPRESSIONS     1. Left ventricular ejection fraction, by estimation, is 55 to 60%. The  left ventricle has normal function. The left ventricle has no regional  wall motion abnormalities. There is mild left ventricular hypertrophy.  Left ventricular diastolic parameters  are consistent with Grade I diastolic dysfunction (impaired relaxation).   2. Right ventricular systolic function is normal. The right ventricular  size is normal. There is normal pulmonary artery systolic pressure.   3. The mitral valve is normal in structure. No evidence of mitral valve  regurgitation. No evidence of mitral stenosis.   4. The tricuspid valve is abnormal.   5. The aortic  valve is tricuspid. There is mild calcification of the  aortic valve. There is mild thickening of the aortic valve. Aortic valve  regurgitation is mild. No aortic stenosis is present.   6. Aortic dilatation noted. There is mild dilatation of the ascending  aorta, measuring 38 mm.   7. The inferior vena cava is normal in size with greater than 50%  respiratory variability, suggesting right atrial pressure of 3 mmHg.   Limited Echo: 10/29/2023 IMPRESSIONS     1. No doppler analysis done.   2. Left ventricular ejection fraction, by estimation, is 55 to 60%. The  left ventricle has normal function. The left ventricle has no regional  wall motion abnormalities. There is mild concentric left ventricular  hypertrophy.   3. Right ventricular systolic function is normal. The right ventricular  size is normal.   4. Left atrial size was moderately dilated.   5. The mitral valve is grossly normal.   6. The aortic valve is tricuspid. No aortic stenosis is present.   7. The inferior vena cava is normal in size with greater than 50%  respiratory variability, suggesting right atrial pressure of 3 mmHg.   Comparison(s): The left ventricular function is unchanged.    Laboratory Data:  High Sensitivity Troponin:  Recent Labs  Lab 10/28/23 1933 10/28/23 2137 10/29/23 0516 10/29/23 0822 10/29/23 1134  TROPONINIHS 77* 597* 1,084* 779* 580*     Chemistry Recent Labs  Lab 10/28/23 1933 10/29/23 0516 10/30/23 0304  NA 136 138 137  K 3.7 3.9 3.9  CL 101 104 102  CO2 26 24 22   GLUCOSE 98 96 87  BUN 12 10 9   CREATININE 0.90 0.79 0.79  CALCIUM  8.7* 8.9 9.0  MG  --   --  1.5*  GFRNONAA >60 >60 >60  ANIONGAP 9 10 13     Recent Labs  Lab 10/28/23 1933 10/30/23 0304  PROT 6.1* 6.4*  ALBUMIN  2.3* 2.3*  AST 33 27  ALT 24 21  ALKPHOS 122 126  BILITOT 0.7 1.1   Lipids  Recent Labs  Lab 10/29/23 0516  CHOL 94  TRIG 67  HDL 41  LDLCALC 40  CHOLHDL 2.3    Hematology Recent  Labs  Lab 10/28/23 1933 10/29/23 0516 10/30/23 0304  WBC 9.7 9.8 9.9  RBC 2.72* 2.99* 3.17*  HGB 8.3* 9.2* 9.2*  HCT 25.8* 29.7* 29.7*  MCV 94.9 99.3 93.7  MCH 30.5 30.8 29.0  MCHC 32.2 31.0 31.0  RDW 16.1* 16.1* 15.6*  PLT 381 375 361   Thyroid   Recent Labs  Lab 10/30/23 0304  TSH 5.293*    BNP Recent Labs  Lab 10/29/23 0822 10/30/23 0304  BNP 1,224.0* 1,581.0*    DDimer No results for input(s): DDIMER in the last 168 hours.   Radiology/Studies:    DG Chest Port 1 View Result Date: 10/28/2023 CLINICAL DATA:  Chest pain and fever EXAM: PORTABLE CHEST 1 VIEW COMPARISON:  10/13/2023, 09/14/2023, 07/19/2022 FINDINGS: Pleuroparenchymal scarring at the left base. Borderline to mild cardiomegaly. No acute airspace disease or pneumothorax. IMPRESSION: No active disease. Pleuroparenchymal scarring at the left base. Electronically Signed   By: Luke Bun M.D.   On: 10/28/2023 19:48     Assessment and Plan:   1. Chest Pain with Atypical Features/Elevated Troponin Values - He presented with episodes of chest pain which were lasting for several hours per his report. No association with exertion and symptoms were occurring at rest. Pain has now resolved. Hs troponin values peaked at 1084 and now downtrending to 580 on most recent check. CRP was elevated at 14.9 and ESR 85.  He is awaiting a bed at [facility] and would anticipate obtaining a cMRI for assessment of myocarditis in the setting of his presumed viral illness. If no evidence of myocarditis, he may require a cardiac catheterization for ischemic evaluation given his elevated enzymes. Will review with Dr. Alvan in regards to starting IV Heparin  in the interim but would likely hold off given concerns for myocarditis and due to him being pain-free. Remains on ASA 81 mg daily, Crestor  5 mg daily (LDL 40 this admission) and Coreg  6.25 mg twice daily.  2. History of HFimpEF - His EF was at 20-25% in 03/2019, normalized in the  interim and at 55-60%. EF remains preserved at 55 to 60% by repeat echocardiogram this admission. He does have trace lower extremity edema. Would anticipate restarting Entresto  or Spironolactone  given his elevated BP but would make gradual changes given recent orthostasis.  3. SVT - He was noted to be in a narrow complex tachycardia on admission which appears most consistent with SVT and did have a brief episode yesterday evening lasting for approximately 5 minutes. He has been restarted on beta-blocker therapy with dose adjustment as discussed  below. K+ at 3.9 this morning but Magnesium  low at 1.5 and supplementation has been ordered by the admitting team.  TSH was also elevated at 5.293 and Free T4 pending. Coreg  just titrated this morning.   4. HTN - Blood pressure has actually been elevated, at 149/102 on most recent check. As discussed above, he did have orthostatic hypotension during prior admissions which led to discontinuation of Entresto , Spironolactone  and Jardiance during that time. He has been restarted on Coreg  and dosing was just increased from 3.125 mg twice daily to 6.25 mg twice daily starting today. Follow BP trend.  5. HLD - He has been continued Crestor  5mg  daily.   6. Fever - He was febrile on admission and blood cultures have been negative thus far and he has been afebrile for the past 24 hours. Respiratory panel also negative. Work-up per the admitting team.    For questions or updates, please contact [facility] Please consult [email or web address] for contact info under    Signed, Laymon CHRISTELLA Qua, PA-C  10/30/2023 11:45 AM   Attending Note  Patient seen and discussed with PA Qua, I agree with her documentation. 78 yo male history of HFimpEF, HTN, cirrhosis, presented with chest pain. Descirbed midchest and upper abdominal pain, would last 3-4 hours constant, no other associated symptoms.     Admission labs  K 136 K 3.7 BUN 12 Cr 0.90 WBC 9.7 Hgb 8.3 LDL  40 ESR 85 CRP 14.9 Procal <0.10 BNP 1224 Lactic acid 1.2-->1 COVID neg/flu neg /RSV neg Extended viral panel: negative EKG SVT 160 lateral ST depressions Trop 77--> 597--> 1084-->779-->580 Jan 2025 echo: LVEF 55-60%, no WMAs, grade I dd, normal RV 10/2023 limited echo: LVEF 55-60%   03/2019 echo: LVEF 20-25%, mod RV dysfunction 07/2019 echo: LVEF 45% 01/2021 echo: LVEF 50%   1.Elevated troponin - presented with chest pain/abdominal pain, fever.  - EKG SVT 160s, lateral ST depressions. ST depressions resolved back in SR - peak trop 1084, trending down - atypical chest pains lasting 3-4 hours at a time, nonexertional - from notes on admission thought to be secondary to myocarditis. ESR 85, CRP 14.9  -Jan 2025 echo: LVEF 55-60%, no WMAs, grade I dd, normal RV -10/2023 limited echo: LVEF 55-60%   - agree with plans for transfer to [facility] and cMRI to better evaluate for possible myocarditis given fever, atypical chest pains. If negative cMRI would need to reconsider ischemic testing - no significant HF, no significant ventricular arrhythmias, chest pain has resolved. Trop trending down.   2.HFimpEF - 03/2019 echo LVEF 20-25% -07/2019 echo: LVEF 45% - 01/2021 echo: LVEF 50% -Jan 2025 echo: LVEF 55-60%, no WMAs, grade I dd, normal RV -10/2023 limited echo: LVEF 55-60% *given rapid improvement of LVEF did not have cath   -off metoprolol due to low HRs 06/2022. In setting of SVT has been started on coreg  here it appears. Tolerating thus far with HRs in the 80s - orthostatic hypotension during prior admission which led to discontinuation of Entresto , Spironolactone  and Jardiance during that time    3.Anemia - admit Hgb 8.3   4. Fever - unclear etiology, negative infectious workup by primary team - prior admit Jan 19 with fever as well without a clear cause   5. SVT - SVT 160s on admission - has been started on coreg , monitor as chart indicates prior low HRs on beta blockers. So  far tolerating without issue rates in the 80s    Dorn Ross MD

## 2023-10-31 ENCOUNTER — Inpatient Hospital Stay (HOSPITAL_COMMUNITY): Payer: 59

## 2023-10-31 DIAGNOSIS — I400 Infective myocarditis: Secondary | ICD-10-CM | POA: Diagnosis not present

## 2023-10-31 DIAGNOSIS — I214 Non-ST elevation (NSTEMI) myocardial infarction: Secondary | ICD-10-CM

## 2023-10-31 LAB — BASIC METABOLIC PANEL
Anion gap: 10 (ref 5–15)
BUN: 6 mg/dL — ABNORMAL LOW (ref 8–23)
CO2: 25 mmol/L (ref 22–32)
Calcium: 8.9 mg/dL (ref 8.9–10.3)
Chloride: 102 mmol/L (ref 98–111)
Creatinine, Ser: 0.88 mg/dL (ref 0.61–1.24)
GFR, Estimated: >60 mL/min (ref >60)
Glucose, Bld: 93 mg/dL (ref 70–99)
Potassium: 3.6 mmol/L (ref 3.5–5.1)
Sodium: 137 mmol/L (ref 135–145)

## 2023-10-31 LAB — RENAL FUNCTION PANEL
Albumin: 2 g/dL — ABNORMAL LOW (ref 3.5–5.0)
Anion gap: 10 (ref 5–15)
BUN: 7 mg/dL — ABNORMAL LOW (ref 8–23)
CO2: 25 mmol/L (ref 22–32)
Calcium: 8.9 mg/dL (ref 8.9–10.3)
Chloride: 102 mmol/L (ref 98–111)
Creatinine, Ser: 0.9 mg/dL (ref 0.61–1.24)
GFR, Estimated: >60 mL/min (ref >60)
Glucose, Bld: 100 mg/dL — ABNORMAL HIGH (ref 70–99)
Phosphorus: 3.6 mg/dL (ref 2.5–4.6)
Potassium: 3.4 mmol/L — ABNORMAL LOW (ref 3.5–5.1)
Sodium: 137 mmol/L (ref 135–145)

## 2023-10-31 LAB — CBC
HCT: 27.1 % — ABNORMAL LOW (ref 39.0–52.0)
HCT: 28.8 % — ABNORMAL LOW (ref 39.0–52.0)
Hemoglobin: 8.7 g/dL — ABNORMAL LOW (ref 13.0–17.0)
Hemoglobin: 9.6 g/dL — ABNORMAL LOW (ref 13.0–17.0)
MCH: 29.5 pg (ref 26.0–34.0)
MCH: 30.4 pg (ref 26.0–34.0)
MCHC: 32.1 g/dL (ref 30.0–36.0)
MCHC: 33.3 g/dL (ref 30.0–36.0)
MCV: 91.9 fL (ref 80.0–100.0)
MCV: 91.1 fL (ref 80.0–100.0)
Platelets: 385 10*3/uL (ref 150–400)
Platelets: 429 10*3/uL — ABNORMAL HIGH (ref 150–400)
RBC: 2.95 MIL/uL — ABNORMAL LOW (ref 4.22–5.81)
RBC: 3.16 MIL/uL — ABNORMAL LOW (ref 4.22–5.81)
RDW: 15.9 % — ABNORMAL HIGH (ref 11.5–15.5)
RDW: 15.9 % — ABNORMAL HIGH (ref 11.5–15.5)
WBC: 8.6 10*3/uL (ref 4.0–10.5)
WBC: 9 10*3/uL (ref 4.0–10.5)
nRBC: 0 % (ref 0.0–0.2)
nRBC: 0 % (ref 0.0–0.2)

## 2023-10-31 LAB — MAGNESIUM
Magnesium: 1.9 mg/dL (ref 1.7–2.4)
Magnesium: 1.8 mg/dL (ref 1.7–2.4)

## 2023-10-31 LAB — URIC ACID: Uric Acid, Serum: 4.9 mg/dL (ref 3.7–8.6)

## 2023-10-31 LAB — TROPONIN I (HIGH SENSITIVITY): Troponin I (High Sensitivity): 336 ng/L (ref <18)

## 2023-10-31 LAB — SEDIMENTATION RATE: Sed Rate: 104 mm/h — ABNORMAL HIGH (ref 0–16)

## 2023-10-31 LAB — BRAIN NATRIURETIC PEPTIDE: B Natriuretic Peptide: 1410 pg/mL — ABNORMAL HIGH (ref 0.0–100.0)

## 2023-10-31 LAB — CK: Total CK: 33 U/L — ABNORMAL LOW (ref 49–397)

## 2023-10-31 LAB — C-REACTIVE PROTEIN: CRP: 17.9 mg/dL — ABNORMAL HIGH (ref <1.0)

## 2023-10-31 MED ORDER — SODIUM CHLORIDE 0.9 % WEIGHT BASED INFUSION
1.0000 mL/kg/h | INTRAVENOUS | Status: DC
  Administered 2023-11-01: 1 mL/kg/h via INTRAVENOUS

## 2023-10-31 MED ORDER — POTASSIUM CHLORIDE CRYS ER 20 MEQ PO TBCR
60.0000 meq | EXTENDED_RELEASE_TABLET | Freq: Once | ORAL | Status: AC
  Administered 2023-10-31: 60 meq via ORAL
  Filled 2023-10-31: qty 3

## 2023-10-31 MED ORDER — COLCHICINE 0.6 MG PO TABS
0.6000 mg | ORAL_TABLET | Freq: Every day | ORAL | Status: DC | PRN
Start: 2023-10-31 — End: 2023-11-10

## 2023-10-31 MED ORDER — SODIUM CHLORIDE 0.9 % WEIGHT BASED INFUSION: 3.0000 mL/kg/h | INTRAVENOUS | Status: AC

## 2023-10-31 MED ORDER — DICLOFENAC SODIUM 1 % EX GEL
2.0000 g | Freq: Four times a day (QID) | CUTANEOUS | Status: DC
  Administered 2023-10-31 – 2023-11-09 (×17): 2 g via TOPICAL
  Filled 2023-10-31 (×2): qty 100

## 2023-10-31 MED ORDER — GADOBUTROL 1 MMOL/ML IV SOLN: 10.0000 mL | Freq: Once | INTRAVENOUS | Status: DC | PRN

## 2023-10-31 NOTE — Plan of Care (Signed)

## 2023-10-31 NOTE — Progress Notes (Signed)
 PROGRESS NOTE  Don [name] FMW:[id number] DOB: 25-Mar-1946   PCP: [facility]  Patient is from: SNF.  Nonambulatory at baseline.  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    In the ED, temp 101.8 F, HR 160, RR 20, BP 175/105, SpO2 95% on room air.  WBC 9.7, hemoglobin 8.3, platelet count 381.  CMP without significant finding.  Trop 77>597.  EKG notable for supraventricular tachycardia, rate 158. Lactic acid 1.2.  COVID/influenza/RSV PCR negative.  UA and CXR without significant finding. Blood cultures x 2 obtained.  Started on broad-spectrum antibiotics.  Cardiology consulted.  Admission requested for acute myocarditis.  SVT and fever resolved.  RVP and blood culture negative.  Antibiotics discontinued.  Patient was evaluated by cardiology who recommended transfer to [facility] for further cardiac evaluation  Subjective: Seen and examined earlier this morning.  No major events overnight of this morning.  No complaints but not a great historian.  He reports bilateral leg/knee stiffness.  Denies chest pain or shortness of breath.  Denies GI or UTI symptoms.  Objective: Vitals:   10/30/23 1952 10/30/23 2325 10/31/23 0825 10/31/23 0828  BP: (!) 157/92 (!) 146/77 128/70 128/70  Pulse: 85 83 85 70  Resp: 19 18 20 20   Temp: 98.5 F (36.9 C) 98.5 F (36.9 C) 99.3 F (37.4 C)   TempSrc: Oral Oral Oral   SpO2: 97% 96%  (!) 51%  Weight:      Height:        Examination:  GENERAL: No apparent distress.  Nontoxic. HEENT: MMM.  Vision and hearing grossly intact.  NECK: Supple.  No apparent JVD.  RESP:  No IWOB.  Fair aeration bilaterally. CVS:  RRR.  Heart sounds normal.  ABD/GI/GU: BS+. Abd soft, NTND.  MSK/EXT:  Moves extremities. No apparent deformity. No edema.  SKIN: no apparent skin lesion or wound NEURO: Awake, alert and oriented appropriately.  No apparent focal neuro deficit. PSYCH: Calm. Normal affect.   Procedures:  None  Microbiology summarized: COVID-19, influenza and RSV PCR nonreactive A 20 pathogen RVP nonreactive Blood cultures negative  Assessment and plan: Suspected non-STEMI: Presented with acute substernal chest pain and fever.  Troponin trended from 77 and peaked at 1600 before trending down.  Limited TTE without significant finding.  Infectious workup unrevealing.  Pro-Cal negative.  CRP elevated to 15.  ESR elevated to 90s.  Antibiotics discontinued. -Cardiology board-plan for [facility] +/-cMRI based on LHC results. -Follow ANA and SPEP. -Continue low-dose aspirin  -Discontinue Toradol .   Supraventricular tachycardia: HR elevated to 160s on arrival.  Previous history of intolerance to beta-blocker due to bradycardia.  Resolved -Started on Coreg  and tolerating.  Continue. -Optimize electrolytes. -Continue telemetry   Fever: Unclear source of infection.  Infectious workup including CXR, UA, cultures and viral panel unrevealing.  Fever resolved.  Antibiotics discontinued.  Chronic HFimpEF: Limited TTE with LVEF of 55 to 60% (previously 20 to 25%)., no RWMA.  BNP elevated to 1600.  Appears euvolemic on exam.  Currently without cardiopulmonary symptoms.  Not on diuretics. -Continue Coreg  as above -Did not tolerate GDMT in the past due to orthostatic hypotension. -Strict intake  and output, daily weight and renal functions  History of gout without acute flareup: While he is unusually high dose colchicine .  He is also on a statin which could increase his risk of rhabdo. -Uric acid and CK. -Will discuss with pharmacy and decrease colchicine  to 0.6 mg twice daily.  -Continue home allopurinol .  History of liver  cirrhosis: Compensated.  Euthyroid sick syndrome: Mild elevated TSH with normal free T4. -Repeat in 4 to 6 weeks  CKD 3A ruled out.  Hyperlipidemia -Continue Crestor  5 mg p.o. daily  History of hepatitis C Outpatient follow-up with gastroenterology/infectious disease.  Bilateral leg pain -Tylenol  as needed -Voltaren  gel   GERD -Protonix  40 mg p.o. daily  Ambulatory dysfunction: Patient reports using chairs with wheels to get around.  -PT/OT eval  Body mass index is 22.34 kg/m.           DVT prophylaxis:  enoxaparin  (LOVENOX ) injection 40 mg Start: 10/29/23 1000  Code Status: Full code Family Communication: None at bedside Level of care: Progressive Status is: Inpatient Remains inpatient appropriate because: Non-STEMI/elevated troponin   Final disposition: Likely back to SNF Consultants:  Cardiology  55 minutes with more than 50% spent in reviewing records, counseling patient/family and coordinating care.   Sch Meds:  Scheduled Meds:  allopurinol   150 mg Oral Daily   ascorbic acid   500 mg Oral Q breakfast   aspirin  EC  81 mg Oral Daily   calcitRIOL   0.25 mcg Oral Daily   carvedilol   6.25 mg Oral BID WC   cholecalciferol   1,000 Units Oral Daily   cyanocobalamin   1,000 mcg Intramuscular Once per day on Monday Friday   diclofenac  Sodium  2 g Topical QID   enoxaparin  (LOVENOX ) injection  40 mg Subcutaneous Q24H   feeding supplement  237 mL Oral BID BM   ferrous sulfate   325 mg Oral Q breakfast   multivitamin with minerals  1 tablet Oral Daily   pantoprazole   40 mg Oral Daily   rosuvastatin   5 mg Oral Daily   umeclidinium bromide   1 puff Inhalation Daily   Continuous Infusions: PRN Meds:.acetaminophen  **OR** acetaminophen , albuterol , colchicine , hydrALAZINE , magnesium  hydroxide, midodrine , morphine  injection, nitroGLYCERIN , ondansetron  **OR** ondansetron  (ZOFRAN ) IV, traZODone   Antimicrobials: Anti-infectives (From admission, onward)    Start      Dose/Rate Route Frequency Ordered Stop   10/28/23 1930  vancomycin  (VANCOCIN ) IVPB 1000 mg/200 mL premix        1,000 mg 200 mL/hr over 60 Minutes Intravenous  Once 10/28/23 1922 10/28/23 2126   10/28/23 1930  ceFEPIme  (MAXIPIME ) 2 g in sodium chloride  0.9 % 100 mL IVPB        2 g 200 mL/hr over 30 Minutes Intravenous  Once 10/28/23 1922 10/28/23 2013        I have personally reviewed the following labs and images: CBC: Recent Labs  Lab 10/28/23 1933 10/29/23 0516 10/30/23 0304 10/31/23 0433 10/31/23 0810  WBC 9.7 9.8 9.9 8.6 9.0  NEUTROABS 7.6  --   --   --   --   HGB 8.3* 9.2* 9.2* 8.7* 9.6*  HCT 25.8* 29.7* 29.7* 27.1* 28.8*  MCV 94.9 99.3 93.7 91.9 91.1  PLT 381 375 361 385 429*   BMP &GFR Recent Labs  Lab 10/28/23 1933 10/29/23 0516 10/30/23 0304 10/31/23 0433 10/31/23 0810  NA 136 138 137 137 137  K 3.7 3.9 3.9 3.6 3.4*  CL 101 104 102 102 102  CO2 26 24 22 25 25   GLUCOSE  98 96 87 93 100*  BUN 12 10 9  6* 7*  CREATININE 0.90 0.79 0.79 0.88 0.90  CALCIUM  8.7* 8.9 9.0 8.9 8.9  MG  --   --  1.5* 1.9 1.8  PHOS  --   --   --   --  3.6   Estimated Creatinine Clearance: 72.6 mL/min (by C-G formula based on SCr of 0.9 mg/dL). Liver & Pancreas: Recent Labs  Lab 10/28/23 1933 10/30/23 0304 10/31/23 0810  AST 33 27  --   ALT 24 21  --   ALKPHOS 122 126  --   BILITOT 0.7 1.1  --   PROT 6.1* 6.4*  --   ALBUMIN  2.3* 2.3* 2.0*   No results for input(s): LIPASE, AMYLASE in the last 168 hours. No results for input(s): AMMONIA in the last 168 hours. Diabetic: No results for input(s): HGBA1C in the last 72 hours. No results for input(s): GLUCAP in the last 168 hours. Cardiac Enzymes: No results for input(s): CKTOTAL, CKMB, CKMBINDEX, TROPONINI in the last 168 hours. No results for input(s): PROBNP in the last 8760 hours. Coagulation Profile: Recent Labs  Lab 10/28/23 1933  INR 1.3*   Thyroid  Function Tests: Recent Labs     10/30/23 0304 10/30/23 0919  TSH 5.293*  --   FREET4  --  1.11   Lipid Profile: Recent Labs    10/29/23 0516  CHOL 94  HDL 41  LDLCALC 40  TRIG 67  CHOLHDL 2.3   Anemia Panel: Recent Labs    10/30/23 0919  FERRITIN 180  TIBC 191*  IRON 16*   Urine analysis:    Component Value Date/Time   COLORURINE AMBER (A) 10/28/2023 1922   APPEARANCEUR CLEAR 10/28/2023 1922   LABSPEC 1.018 10/28/2023 1922   PHURINE 6.0 10/28/2023 1922   GLUCOSEU NEGATIVE 10/28/2023 1922   HGBUR NEGATIVE 10/28/2023 1922   BILIRUBINUR NEGATIVE 10/28/2023 1922   KETONESUR NEGATIVE 10/28/2023 1922   PROTEINUR NEGATIVE 10/28/2023 1922   UROBILINOGEN 1.0 02/01/2012 1300   NITRITE NEGATIVE 10/28/2023 1922   LEUKOCYTESUR NEGATIVE 10/28/2023 1922   Sepsis Labs: Invalid input(s): PROCALCITONIN, LACTICIDVEN  Microbiology: Recent Results (from the past 240 hours)  Resp panel by RT-PCR (RSV, Flu A&B, Covid) Anterior Nasal Swab     Status: None   Collection Time: 10/28/23  7:22 PM   Specimen: Anterior Nasal Swab  Result Value Ref Range Status   SARS Coronavirus 2 by RT PCR NEGATIVE NEGATIVE Final    Comment: (NOTE) SARS-CoV-2 target nucleic acids are NOT DETECTED.  The SARS-CoV-2 RNA is generally detectable in upper respiratory specimens during the acute phase of infection. The lowest concentration of SARS-CoV-2 viral copies this assay can detect is 138 copies/mL. A negative result does not preclude SARS-Cov-2 infection and should not be used as the sole basis for treatment or other patient management decisions. A negative result may occur with  improper specimen collection/handling, submission of specimen other than nasopharyngeal swab, presence of viral mutation(s) within the areas targeted by this assay, and inadequate number of viral copies(<138 copies/mL). A negative result must be combined with clinical observations, patient history, and epidemiological information. The expected result  is Negative.  Fact Sheet for Patients:  bloggercourse.com  Fact Sheet for Healthcare Providers:  seriousbroker.it  This test is no t yet approved or cleared by the [place]  FDA and  has been authorized for detection and/or diagnosis of SARS-CoV-2 by FDA under an Emergency Use Authorization (EUA). This EUA will remain  in  effect (meaning this test can be used) for the duration of the COVID-19 declaration under Section 564(b)(1) of the Act, 21 U.S.C.section 360bbb-3(b)(1), unless the authorization is terminated  or revoked sooner.       Influenza A by PCR NEGATIVE NEGATIVE Final   Influenza B by PCR NEGATIVE NEGATIVE Final    Comment: (NOTE) The Xpert Xpress SARS-CoV-2/FLU/RSV plus assay is intended as an aid in the diagnosis of influenza from Nasopharyngeal swab specimens and should not be used as a sole basis for treatment. Nasal washings and aspirates are unacceptable for Xpert Xpress SARS-CoV-2/FLU/RSV testing.  Fact Sheet for Patients: bloggercourse.com  Fact Sheet for Healthcare Providers: seriousbroker.it  This test is not yet approved or cleared by the [place]  FDA and has been authorized for detection and/or diagnosis of SARS-CoV-2 by FDA under an Emergency Use Authorization (EUA). This EUA will remain in effect (meaning this test can be used) for the duration of the COVID-19 declaration under Section 564(b)(1) of the Act, 21 U.S.C. section 360bbb-3(b)(1), unless the authorization is terminated or revoked.     Resp Syncytial Virus by PCR NEGATIVE NEGATIVE Final    Comment: (NOTE) Fact Sheet for Patients: bloggercourse.com  Fact Sheet for Healthcare Providers: seriousbroker.it  This test is not yet approved or cleared by the [place]  FDA and has been authorized for detection and/or diagnosis of  SARS-CoV-2 by FDA under an Emergency Use Authorization (EUA). This EUA will remain in effect (meaning this test can be used) for the duration of the COVID-19 declaration under Section 564(b)(1) of the Act, 21 U.S.C. section 360bbb-3(b)(1), unless the authorization is terminated or revoked.  Performed at [facility], [address], [place], [place] [address]   Blood Culture (routine x 2)     Status: None (Preliminary result)   Collection Time: 10/28/23  7:33 PM   Specimen: Left Antecubital; Blood  Result Value Ref Range Status   Specimen Description LEFT ANTECUBITAL BLOOD  Final   Special Requests   Final    BOTTLES DRAWN AEROBIC AND ANAEROBIC Blood Culture adequate volume   Culture   Final    NO GROWTH 3 DAYS Performed at [facility], [address], [place], [place] [address]    Report Status PENDING  Incomplete  Blood Culture (routine x 2)     Status: None (Preliminary result)   Collection Time: 10/28/23  7:43 PM   Specimen: BLOOD RIGHT FOREARM  Result Value Ref Range Status   Specimen Description BLOOD RIGHT FOREARM BLOOD  Final   Special Requests   Final    BOTTLES DRAWN AEROBIC AND ANAEROBIC Blood Culture adequate volume   Culture   Final    NO GROWTH 3 DAYS Performed at [facility], [address], [place], [place] [address]    Report Status PENDING  Incomplete  Respiratory (~20 pathogens) panel by PCR     Status: None   Collection Time: 10/29/23  5:20 PM   Specimen: Nasopharyngeal Swab; Respiratory  Result Value Ref Range Status   Adenovirus NOT DETECTED NOT DETECTED Final   Coronavirus 229E NOT DETECTED NOT DETECTED Final    Comment: (NOTE) The Coronavirus on the Respiratory Panel, DOES NOT test for the novel  Coronavirus (2019 nCoV)    Coronavirus HKU1 NOT DETECTED NOT DETECTED Final   Coronavirus NL63 NOT DETECTED NOT DETECTED Final   Coronavirus OC43 NOT DETECTED NOT DETECTED Final   Metapneumovirus NOT DETECTED NOT DETECTED Final   Rhinovirus / Enterovirus  NOT DETECTED NOT DETECTED Final  Influenza A NOT DETECTED NOT DETECTED Final   Influenza B NOT DETECTED NOT DETECTED Final   Parainfluenza Virus 1 NOT DETECTED NOT DETECTED Final   Parainfluenza Virus 2 NOT DETECTED NOT DETECTED Final   Parainfluenza Virus 3 NOT DETECTED NOT DETECTED Final   Parainfluenza Virus 4 NOT DETECTED NOT DETECTED Final   Respiratory Syncytial Virus NOT DETECTED NOT DETECTED Final   Bordetella pertussis NOT DETECTED NOT DETECTED Final   Bordetella Parapertussis NOT DETECTED NOT DETECTED Final   Chlamydophila pneumoniae NOT DETECTED NOT DETECTED Final   Mycoplasma pneumoniae NOT DETECTED NOT DETECTED Final    Comment: Performed at [facility], [address], [place], [place] [address]    Radiology Studies: No results found.    Elizardo Chilson T. Mitsuru Dault [facility]  If 7PM-7AM, please contact night-coverage [email or web address] 10/31/2023, 12:06 PM

## 2023-10-31 NOTE — Evaluation (Signed)
 Physical Therapy Evaluation Patient Details Name: Don [name] MRN: [id number] DOB: 1946-07-04 Today's Date: 10/31/2023  History of Present Illness  Don [name] is a 78 y.o. male admitted 10/28/23 for chest pain and fever. EKG with supraventricular tachycardia with rate 158, converted back to NSR following treatment of fever with Tylenol  and 1 L NS bolus. Limited echocardiogram was obtained which shows a preserved EF of 55 to 60% with no regional wall motion abnormalities. Of note, pt was recently admitted 1/3-1/9 for evaluation of syncope and this was felt to be due to orthostasis as he had been on Lasix  and 1/18-1/21 for dehydration in the setting of poor oral intake. PMH of chronic systolic CHF, CKD stage IIIa, HTN, HLD, cirrhosis, gout, and hepatitis C.   Clinical Impression  Pt admitted with above diagnosis. PTA, pt was residing at a SNF and relied on facility staff to assist him with ADLs and functional mobility. Pt currently with functional limitations due to the deficits listed below (see PT Problem List). Pt resisted BLE PROM and demonstrated inconsistent BLE activation intermittently engaging during mobility tasks. Pt required mod-maxA for bed mobility, sat EOB with supervision, and was unable to perform a STS with totalA. Pt appeared to be self-limiting at times. Pt will benefit from acute skilled PT to increase his independence and safety with mobility to allow discharge. Recommend return to post acute rehab for follow up therapy, <3 hours/day.     If plan is discharge home, recommend the following: A lot of help with bathing/dressing/bathroom;Help with stairs or ramp for entrance;Assistance with cooking/housework;Two people to help with walking and/or transfers;Assist for transportation   Can travel by private vehicle   No    Equipment Recommendations Other (comment) (TBD at next venue)  Recommendations for Other Services       Functional Status Assessment Patient has had a  recent decline in their functional status and/or demonstrates limited ability to make significant improvements in function in a reasonable and predictable amount of time     Precautions / Restrictions Precautions Precautions: Fall Restrictions Weight Bearing Restrictions Per Provider Order: No      Mobility  Bed Mobility Overal bed mobility: Needs Assistance Bed Mobility: Supine to Sit, Sit to Supine     Supine to sit: Mod assist, Max assist Sit to supine: Mod assist   General bed mobility comments: Pt inconsistent in performance, ocassionally assisting in bringing LE off EOB then reporting I can't and requiring mod-maxA to move BLE. Cued pt to reach for bedrail, HHA to guide RUE to railing, and modA at trunk to achieve upright. Pt required maxA via bedpad to scoot to EOB. Returning to supine pt initially started to bring BLE, then required modA to return LE to bed. He was able to manage trunk and turn onto back with CGA. Reposition in bed using bedfeatures and maxA.    Transfers Overall transfer level: Needs assistance Equipment used: Rolling walker (2 wheels) Transfers: Sit to/from Stand Sit to Stand: Total assist           General transfer comment: Attempted to stand from EOB x2, but pt was unable.    Ambulation/Gait               General Gait Details: Deferred  Stairs            Wheelchair Mobility     Tilt Bed    Modified Rankin (Stroke Patients Only)       Balance Overall balance assessment: Needs  assistance Sitting-balance support: Feet supported, Bilateral upper extremity supported Sitting balance-Leahy Scale: Fair Sitting balance - Comments: Pt maintained seated balance EOB for a prolonged period of time with supervision.                                     Pertinent Vitals/Pain Pain Assessment Pain Assessment: 0-10 Pain Score: 5  Pain Location: R abdomen Pain Descriptors / Indicators: Aching, Discomfort, Sore Pain  Intervention(s): Monitored during session    Home Living Family/patient expects to be discharged to:: Skilled nursing facility Living Arrangements: Other relatives Available Help at Discharge: Family;Available 24 hours/day Type of Home: House Home Access: Stairs to enter Entrance Stairs-Rails: Can reach both Entrance Stairs-Number of Steps: 6 Alternate Level Stairs-Number of Steps: 10 Home Layout: Two level;Able to live on main level with bedroom/bathroom Home Equipment: BSC/3in1;Rolling Walker (2 wheels);Crutches;Cane - single point      Prior Function Prior Level of Function : Needs assist       Physical Assist : Mobility (physical);ADLs (physical) Mobility (physical): Bed mobility;Transfers;Gait;Stairs ADLs (physical): IADLs;Bathing;Dressing;Toileting;Grooming Mobility Comments: Pt reports at the SNF he has relied on the staff to be able to get in/out of bed and transfer. Pt states he hasn't walked in months. ADLs Comments: Pt reports at the SNF he has relied on the staff for all ADLs. He is able to feed himself.     Extremity/Trunk Assessment   Upper Extremity Assessment Upper Extremity Assessment: Defer to OT evaluation    Lower Extremity Assessment Lower Extremity Assessment: RLE deficits/detail;LLE deficits/detail RLE Deficits / Details: Supine PROM to all joints was met with resistance. Minimal ankle DF/PF, knee flex to ~20deg, and hip flex to ~10deg. Pt demonstrated active toe wiggles and minimal hip abd/add. Unable to MMT. RLE: Unable to fully assess due to pain (Pt appeared to be self-limiting and was inconsistent with performance during functional activity.) LLE Deficits / Details: Supine PROM to all joints was met with resistance. Minimal ankle DF/PF, knee flex to ~20deg, and hip flex to ~10deg. Pt demonstrated active toe wiggles and minimal hip abd/add. Unable to MMT. LLE: Unable to fully assess due to pain (Pt appeared to be self-limiting and was inconsistent with  performance during functional activity.)    Cervical / Trunk Assessment Cervical / Trunk Assessment: Normal  Communication   Communication Communication: No apparent difficulties Cueing Techniques: Verbal cues;Tactile cues  Cognition Arousal: Alert Behavior During Therapy: Flat affect Overall Cognitive Status: Within Functional Limits for tasks assessed                                 General Comments: Pt A,O to self, time, and place but is unclear on the situation.        General Comments General comments (skin integrity, edema, etc.): VSS on RA    Exercises General Exercises - Lower Extremity Ankle Circles/Pumps: Supine, Both (Instructed pt to complete 10 reps every hour) Quad Sets: Supine, Both, 10 reps (Instructed pt to complete 10 reps every hour) Gluteal Sets: Supine, Both (Instructed pt to complete 10 reps every hour) Heel Slides: Supine, Both (Instructed pt to complete 10 reps every hour) Hip ABduction/ADduction: Supine, Both (Instructed pt to complete 10 reps every hour)   Assessment/Plan    PT Assessment Patient needs continued PT services  PT Problem List Decreased strength;Decreased activity tolerance;Decreased balance;Decreased mobility;Decreased range  of motion;Decreased knowledge of use of DME;Decreased safety awareness;Pain       PT Treatment Interventions DME instruction;Gait training;Stair training;Functional mobility training;Therapeutic activities;Therapeutic exercise;Balance training;Patient/family education;Wheelchair mobility training    PT Goals (Current goals can be found in the Care Plan section)  Acute Rehab PT Goals Patient Stated Goal: Move easier PT Goal Formulation: With patient Time For Goal Achievement: 11/14/23 Potential to Achieve Goals: Fair    Frequency Min 1X/week     Co-evaluation               AM-PAC PT 6 Clicks Mobility  Outcome Measure Help needed turning from your back to your side while in a flat  bed without using bedrails?: A Lot Help needed moving from lying on your back to sitting on the side of a flat bed without using bedrails?: A Lot Help needed moving to and from a bed to a chair (including a wheelchair)?: Total Help needed standing up from a chair using your arms (e.g., wheelchair or bedside chair)?: Total Help needed to walk in hospital room?: Total Help needed climbing 3-5 steps with a railing? : Total 6 Click Score: 8    End of Session Equipment Utilized During Treatment: Gait belt Activity Tolerance: Patient limited by pain;Other (comment) (Pt appeared to be self-limiting during mobility tasks) Patient left: in bed;with bed alarm set;with call bell/phone within reach Nurse Communication: Mobility status PT Visit Diagnosis: Unsteadiness on feet (R26.81);Other abnormalities of gait and mobility (R26.89);Muscle weakness (generalized) (M62.81);Pain Pain - Right/Left: Right Pain - part of body:  (Abdomen)    Time: 9067-8996 PT Time Calculation (min) (ACUTE ONLY): 31 min   Charges:   PT Evaluation $PT Eval Moderate Complexity: 1 Mod PT Treatments $Therapeutic Exercise: 8-22 mins PT General Charges $$ ACUTE PT VISIT: 1 Visit         Randall SAUNDERS, PT, DPT Acute Rehabilitation Services Office: [phone] Secure Chat Preferred   Delon CHRISTELLA Callander 10/31/2023, 10:54 AM

## 2023-10-31 NOTE — TOC Initial Note (Addendum)
 Transition of Care [facility]) - Initial/Assessment Note    Patient Details  Name: Don [name] MRN: [id number] Date of Birth: 10-04-1945  [facility]) CM/SW Contact:    Don [name], LCSWA Phone Number: 10/31/2023, 12:43 PM  Clinical Narrative:                  CSW received consult for possible SNF placement at time of discharge. Due to patients current orientation CSW Lvm with patients sister Don [name]. CSW awaiting call back to discuss  PT recommendation of SNF placement for patient at time of discharge.    Update- CSW received call back from Don [name] patients sister. CSW discussed  PT recommendation of SNF placement at time of discharge for patient. Patients sister Don [name] reports PTA to [facility] patient came from [facility] short term. Patients sister expressed understanding of PT recommendation and is agreeable to SNF placement for patient at time of discharge. Patients sister would like for patient to return to [facility] for short term rehab.CSW discussed insurance authorization process.  No further questions reported at this time. Patients sister request for MD to give her a call for medical update. CSW informed MD.CSW to continue to follow and assist with discharge planning needs.   CSW started insurance authorization for patient Don [name] ID# [id number]. Insurance authorization currently pending.   Expected Discharge Plan: Skilled Nursing Facility Barriers to Discharge: Continued Medical Work up   Patient Goals and CMS Choice Patient states their goals for this hospitalization and ongoing recovery are:: get better          Expected Discharge Plan and Services In-house Referral: Clinical Social Work     Living arrangements for the past 2 months: Skilled Nursing Facility                                      Prior Living Arrangements/Services Living arrangements for the past 2 months: Skilled Nursing Facility   Patient language and need for interpreter reviewed::  Yes Do you feel safe going back to the place where you live?: Yes      Need for Family Participation in Patient Care: Yes (Comment) Care giver support system in place?: Yes (comment)   Criminal Activity/Legal Involvement Pertinent to Current Situation/Hospitalization: No - Comment as needed  Activities of Daily Living   ADL Screening (condition at time of admission) Independently performs ADLs?: No Does the patient have a NEW difficulty with bathing/dressing/toileting/self-feeding that is expected to last >3 days?: Yes (Initiates electronic notice to provider for possible OT consult) Does the patient have a NEW difficulty with getting in/out of bed, walking, or climbing stairs that is expected to last >3 days?: Yes (Initiates electronic notice to provider for possible PT consult) Does the patient have a NEW difficulty with communication that is expected to last >3 days?: No Is the patient deaf or have difficulty hearing?: No Does the patient have difficulty seeing, even when wearing glasses/contacts?: No Does the patient have difficulty concentrating, remembering, or making decisions?: Yes  Permission Sought/Granted                  Emotional Assessment Appearance:: Appears stated age     Orientation: : Oriented to Self, Oriented to Place, Oriented to Situation Alcohol / Substance Use: Not Applicable Psych Involvement: No (comment)  Admission diagnosis:  Elevated troponin [R79.89] Chest pain at rest [R07.9] Acute myocarditis [I40.9] Fever, unspecified fever  cause [R50.9] Patient Active Problem List   Diagnosis Date Noted   Non-ST elevation (NSTEMI) myocardial infarction (HCC) 10/31/2023   GERD without esophagitis 10/29/2023   Dyslipidemia 10/29/2023   Acute myocarditis 10/28/2023   Dehydration 10/15/2023   Sepsis (HCC) 10/14/2023   Hypotension 10/13/2023   Syncope 09/29/2023   NICM (nonischemic cardiomyopathy) (HCC) 09/29/2023   CKD (chronic kidney disease) stage 3,  GFR 30-59 ml/min (HCC) 09/29/2023   Right hand pain 08/04/2023   Atypical chest pain 08/04/2023   AKI (acute kidney injury) (HCC) 08/03/2023   Leukocytosis 08/03/2023   Falls 08/03/2023   Hypertension    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 [place] [address] Phone: [phone] Fax: [phone]     Social Drivers of Health (SDOH) Social History: SDOH Screenings   Food Insecurity: Patient Unable To Answer (10/30/2023)  Housing: Low Risk  (10/14/2023)  Transportation Needs: No Transportation Needs (10/14/2023)  Utilities: Not At Risk (10/14/2023)  Social Connections: Unknown (10/31/2023)  Tobacco Use: Medium Risk (10/28/2023)   SDOH Interventions:     Readmission Risk Interventions    10/29/2023   10:25 AM 10/14/2023   11:02 AM  Readmission Risk Prevention Plan  Transportation Screening Complete Complete  HRI or Home Care Consult  Complete  Social Work Consult for Recovery Care Planning/Counseling  Complete  Palliative Care Screening  Not Applicable  Medication Review Oceanographer) Complete Complete  HRI or Home Care Consult Complete   SW Recovery Care/Counseling Consult Complete   Palliative Care Screening Not Applicable   Skilled Nursing Facility Complete

## 2023-10-31 NOTE — [place] FL2 (Signed)
 [place]  MEDICAID FL2 LEVEL OF CARE FORM     IDENTIFICATION  Patient Name: Don [name] Birthdate: 01-01-1946 Sex: male Admission Date (Current Location): 10/28/2023  [facility] and [place] Number:  Producer, Television/film/video and Address:  [facility] [place], [address], [place], [place] [address]      Provider Number: [id number]  Attending Physician Name and Address:  Kathrin Mignon DASEN, MD  Relative Name and Phone Number:       Current Level of Care: Hospital Recommended Level of Care: Skilled Nursing Facility Prior Approval Number:    Date Approved/Denied:   PASRR Number: [id number]  Discharge Plan: SNF    Current Diagnoses: Patient Active Problem List   Diagnosis Date Noted   Non-ST elevation (NSTEMI) myocardial infarction (HCC) 10/31/2023   GERD without esophagitis 10/29/2023   Dyslipidemia 10/29/2023   Acute myocarditis 10/28/2023   Dehydration 10/15/2023   Sepsis (HCC) 10/14/2023   Hypotension 10/13/2023   Syncope 09/29/2023   NICM (nonischemic cardiomyopathy) (HCC) 09/29/2023   CKD (chronic kidney disease) stage 3, GFR 30-59 ml/min (HCC) 09/29/2023   Right hand pain 08/04/2023   Atypical chest pain 08/04/2023   AKI (acute kidney injury) (HCC) 08/03/2023   Leukocytosis 08/03/2023   Falls 08/03/2023   Hypertension    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   Pleural effusion    High cholesterol 03/19/2015   GERD (gastroesophageal reflux disease) 03/19/2015   Gout 03/19/2015   Hepatitis C 03/19/2015    Orientation RESPIRATION BLADDER Height & Weight     Self, Place  Normal Incontinent Weight: 164 lb 10.9 oz (74.7 kg) Height:  6' (182.9 cm)  BEHAVIORAL SYMPTOMS/MOOD NEUROLOGICAL BOWEL NUTRITION STATUS      Continent Diet (see dc summary)  AMBULATORY STATUS COMMUNICATION OF NEEDS Skin   Extensive Assist Verbally Normal                       Personal Care Assistance Level of Assistance  Bathing,  Feeding, Dressing Bathing Assistance: Maximum assistance Feeding assistance: Independent Dressing Assistance: Maximum assistance     Functional Limitations Info  Sight, Hearing, Speech Sight Info: Adequate Hearing Info: Impaired Speech Info: Adequate    SPECIAL CARE FACTORS FREQUENCY  PT (By licensed PT), OT (By licensed OT)     PT Frequency: 5x min weekly OT Frequency: 5x min weekly            Contractures Contractures Info: Not present    Additional Factors Info  Code Status, Allergies Code Status Info: Full Allergies Info: Penicillins           Current Medications (10/31/2023):  This is the current hospital active medication list Current Facility-Administered Medications  Medication Dose Route Frequency Provider Last Rate Last Admin   [START ON 11/01/2023] 0.9% sodium chloride  infusion  3 mL/kg/hr Intravenous Continuous Goodrich, Callie E, PA-C       Followed by   NOREEN ON 11/01/2023] 0.9% sodium chloride  infusion  1 mL/kg/hr Intravenous Continuous Goodrich, Callie E, PA-C       acetaminophen  (TYLENOL ) tablet 650 mg  650 mg Oral Q6H PRN Mansy, Jan A, MD       Or   acetaminophen  (TYLENOL ) suppository 650 mg  650 mg Rectal Q6H PRN Mansy, Jan A, MD       albuterol  (PROVENTIL ) (2.5 MG/3ML) 0.083% nebulizer solution 2.5 mg  2.5 mg Nebulization Q4H PRN Austria, Camellia PARAS, DO  allopurinol  (ZYLOPRIM ) tablet 150 mg  150 mg Oral Daily Mansy, Jan A, MD   150 mg at 10/31/23 0820   ascorbic acid  (VITAMIN C ) tablet 500 mg  500 mg Oral Q breakfast Mansy, Jan A, MD   500 mg at 10/31/23 0820   aspirin  EC tablet 81 mg  81 mg Oral Daily Mansy, Jan A, MD   81 mg at 10/31/23 0820   calcitRIOL  (ROCALTROL ) capsule 0.25 mcg  0.25 mcg Oral Daily Mansy, Jan A, MD   0.25 mcg at 10/31/23 0820   carvedilol  (COREG ) tablet 6.25 mg  6.25 mg Oral BID WC Austria, Eric J, DO   6.25 mg at 10/31/23 0820   cholecalciferol  (VITAMIN D3) 25 MCG (1000 UNIT) tablet 1,000 Units  1,000 Units Oral Daily Mansy, Jan  A, MD   1,000 Units at 10/31/23 9179   colchicine  tablet 0.6 mg  0.6 mg Oral Daily PRN Kathrin Simmer T, MD       cyanocobalamin  (VITAMIN B12) injection 1,000 mcg  1,000 mcg Intramuscular Once per day on Monday Friday Mansy, Jan A, MD   1,000 mcg at 10/29/23 2110   diclofenac  Sodium (VOLTAREN ) 1 % topical gel 2 g  2 g Topical QID Gonfa, Taye T, MD       enoxaparin  (LOVENOX ) injection 40 mg  40 mg Subcutaneous Q24H Mansy, Jan A, MD   40 mg at 10/31/23 0820   feeding supplement (ENSURE ENLIVE / ENSURE PLUS) liquid 237 mL  237 mL Oral BID BM Mansy, Jan A, MD   237 mL at 10/31/23 9051   ferrous sulfate  tablet 325 mg  325 mg Oral Q breakfast Mansy, Jan A, MD   325 mg at 10/31/23 0820   gadobutrol  (GADAVIST ) 1 MMOL/ML injection 10 mL  10 mL Intravenous Once PRN Goodrich, Callie E, PA-C       hydrALAZINE  (APRESOLINE ) tablet 25 mg  25 mg Oral Q6H PRN Austria, Eric J, DO   25 mg at 10/29/23 1601   magnesium  hydroxide (MILK OF MAGNESIA) suspension 30 mL  30 mL Oral Daily PRN Mansy, Jan A, MD       midodrine  (PROAMATINE ) tablet 5 mg  5 mg Oral TID PRN Austria, Eric J, DO       morphine  (PF) 2 MG/ML injection 2 mg  2 mg Intravenous Q2H PRN Mansy, Jan A, MD       multivitamin with minerals tablet 1 tablet  1 tablet Oral Daily Mansy, Jan A, MD   1 tablet at 10/31/23 0820   nitroGLYCERIN  (NITROSTAT ) SL tablet 0.4 mg  0.4 mg Sublingual Q5 min PRN Mansy, Jan A, MD       ondansetron  (ZOFRAN ) tablet 4 mg  4 mg Oral Q6H PRN Mansy, Jan A, MD       Or   ondansetron  (ZOFRAN ) injection 4 mg  4 mg Intravenous Q6H PRN Mansy, Jan A, MD       pantoprazole  (PROTONIX ) EC tablet 40 mg  40 mg Oral Daily Mansy, Jan A, MD   40 mg at 10/31/23 0820   rosuvastatin  (CRESTOR ) tablet 5 mg  5 mg Oral Daily Mansy, Jan A, MD   5 mg at 10/31/23 0820   traZODone  (DESYREL ) tablet 25 mg  25 mg Oral QHS PRN Mansy, Jan A, MD       umeclidinium bromide  (INCRUSE ELLIPTA ) 62.5 MCG/ACT 1 puff  1 puff Inhalation Daily Austria, Eric J, DO   1 puff at  10/31/23 [phone]  Discharge Medications: Please see discharge summary for a list of discharge medications.  Relevant Imaging Results:  Relevant Lab Results:   Additional Information SSN: [id number]  [place], [place]

## 2023-10-31 NOTE — Progress Notes (Signed)
 DAILY PROGRESS NOTE   Patient Name: Don [name] Date of Encounter: 10/31/2023 Cardiologist: Jayson Sierras, MD  Chief Complaint   No chest pain  Patient Profile   Don [name] is a 78 y.o. 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 Austria.   Subjective   Afebrile overnight. No further chest pain or positional chest pain.  On IV Heparin  for troponin elevation. Concern for possible viral myocarditis, although LVEF preserved, no pericardial effusion - inflammatory markers are elevated, but procalcitonin is low. Only complaints is leg pain and left knee swelling. ANA negative - ESR >100 today, CRP remains elevated at 17.9. BNP elevated at 1581.  Objective   Vitals:   10/30/23 1952 10/30/23 2325 10/31/23 0825 10/31/23 0828  BP: (!) 157/92 (!) 146/77 128/70 128/70  Pulse: 85 83 85 70  Resp: 19 18 20 20   Temp: 98.5 F (36.9 C) 98.5 F (36.9 C) 99.3 F (37.4 C)   TempSrc: Oral Oral Oral   SpO2: 97% 96%  (!) 51%  Weight:      Height:        Intake/Output Summary (Last 24 hours) at 10/31/2023 1024 Last data filed at 10/31/2023 9140 Gross per 24 hour  Intake 360 ml  Output 400 ml  Net -40 ml   Filed Weights   10/28/23 1856 10/30/23 1725  Weight: 75 kg 74.7 kg    Physical Exam   General appearance: alert and no distress Lungs: clear to auscultation bilaterally Heart: irregularly irregular rhythm Extremities: extremities normal, atraumatic, no cyanosis or edema and no warmth or obvious swelling of the left knee Neurologic: Grossly normal  Inpatient Medications    Scheduled Meds:  allopurinol   150 mg Oral Daily   ascorbic acid   500 mg Oral Q breakfast   aspirin  EC  81 mg Oral Daily   calcitRIOL   0.25 mcg Oral Daily   carvedilol   6.25 mg Oral BID WC   cholecalciferol   1,000 Units Oral  Daily   cyanocobalamin   1,000 mcg Intramuscular Once per day on Monday Friday   enoxaparin  (LOVENOX ) injection  40 mg Subcutaneous Q24H   feeding supplement  237 mL Oral BID BM   ferrous sulfate   325 mg Oral Q breakfast   multivitamin with minerals  1 tablet Oral Daily   pantoprazole   40 mg Oral Daily   rosuvastatin   5 mg Oral Daily   umeclidinium bromide   1 puff Inhalation Daily    Continuous Infusions:   PRN Meds: acetaminophen  **OR** acetaminophen , albuterol , colchicine , hydrALAZINE , ketorolac , magnesium  hydroxide, midodrine , morphine  injection, nitroGLYCERIN , ondansetron  **OR** ondansetron  (ZOFRAN ) IV, traZODone    Labs   Results for orders placed or performed during the hospital encounter of 10/28/23 (from the past 48 hours)  ANA w/Reflex if Positive     Status: None   Collection Time: 10/29/23 11:29 AM  Result Value Ref Range   Anti Nuclear Antibody (ANA) Negative Negative    Comment: (NOTE) Performed At: [facility] [employer] [address] [place], [place] [id number] Jennette Shorter MD Ey:[id number]   Troponin I (High Sensitivity)     Status: Abnormal   Collection Time: 10/29/23 11:34 AM  Result Value Ref Range   Troponin I (High Sensitivity) 580 (HH) <18 ng/L    Comment: CRITICAL VALUE NOTED. VALUE IS CONSISTENT WITH PREVIOUSLY REPORTED/CALLED VALUE (  NOTE) Elevated high sensitivity troponin I (hsTnI) values and significant  changes across serial measurements may suggest ACS but many other  chronic and acute conditions are known to elevate hsTnI results.  Refer to the Links section for chest pain algorithms and additional  guidance. Performed at [facility], [address] [place], [place] [address]   Respiratory (~20 pathogens) panel by PCR     Status: None   Collection Time: 10/29/23  5:20 PM   Specimen: Nasopharyngeal Swab; Respiratory  Result Value Ref Range   Adenovirus NOT DETECTED NOT DETECTED   Coronavirus 229E NOT DETECTED NOT DETECTED    Comment:  (NOTE) The Coronavirus on the Respiratory Panel, DOES NOT test for the novel  Coronavirus (2019 nCoV)    Coronavirus HKU1 NOT DETECTED NOT DETECTED   Coronavirus NL63 NOT DETECTED NOT DETECTED   Coronavirus OC43 NOT DETECTED NOT DETECTED   Metapneumovirus NOT DETECTED NOT DETECTED   Rhinovirus / Enterovirus NOT DETECTED NOT DETECTED   Influenza A NOT DETECTED NOT DETECTED   Influenza B NOT DETECTED NOT DETECTED   Parainfluenza Virus 1 NOT DETECTED NOT DETECTED   Parainfluenza Virus 2 NOT DETECTED NOT DETECTED   Parainfluenza Virus 3 NOT DETECTED NOT DETECTED   Parainfluenza Virus 4 NOT DETECTED NOT DETECTED   Respiratory Syncytial Virus NOT DETECTED NOT DETECTED   Bordetella pertussis NOT DETECTED NOT DETECTED   Bordetella Parapertussis NOT DETECTED NOT DETECTED   Chlamydophila pneumoniae NOT DETECTED NOT DETECTED   Mycoplasma pneumoniae NOT DETECTED NOT DETECTED    Comment: Performed at [facility], [address], [place], [place] [address]  Comprehensive metabolic panel     Status: Abnormal   Collection Time: 10/30/23  3:04 AM  Result Value Ref Range   Sodium 137 135 - 145 mmol/L   Potassium 3.9 3.5 - 5.1 mmol/L   Chloride 102 98 - 111 mmol/L   CO2 22 22 - 32 mmol/L   Glucose, Bld 87 70 - 99 mg/dL    Comment: Glucose reference range applies only to samples taken after fasting for at least 8 hours.   BUN 9 8 - 23 mg/dL   Creatinine, Ser 9.20 0.61 - 1.24 mg/dL   Calcium  9.0 8.9 - 10.3 mg/dL   Total Protein 6.4 (L) 6.5 - 8.1 g/dL   Albumin  2.3 (L) 3.5 - 5.0 g/dL   AST 27 15 - 41 U/L   ALT 21 0 - 44 U/L   Alkaline Phosphatase 126 38 - 126 U/L   Total Bilirubin 1.1 0.0 - 1.2 mg/dL   GFR, Estimated >39 >39 mL/min    Comment: (NOTE) Calculated using the CKD-EPI Creatinine Equation (2021)    Anion gap 13 5 - 15    Comment: Performed at [facility], [address], [place], [place] [address]  Magnesium      Status: Abnormal   Collection Time: 10/30/23  3:04 AM  Result  Value Ref Range   Magnesium  1.5 (L) 1.7 - 2.4 mg/dL    Comment: REPEATED TO VERIFY Performed at [facility], [address] [place], [place] [address]   CBC     Status: Abnormal   Collection Time: 10/30/23  3:04 AM  Result Value Ref Range   WBC 9.9 4.0 - 10.5 K/uL   RBC 3.17 (L) 4.22 - 5.81 MIL/uL   Hemoglobin 9.2 (L) 13.0 - 17.0 g/dL   HCT 70.2 (L) 60.9 - 47.9 %   MCV 93.7 80.0 - 100.0 fL   MCH 29.0  26.0 - 34.0 pg   MCHC 31.0 30.0 - 36.0 g/dL   RDW 84.3 (H) 88.4 - 84.4 %   Platelets 361 150 - 400 K/uL   nRBC 0.0 0.0 - 0.2 %    Comment: Performed at [facility], [address], [place], [place] [address]  Brain natriuretic peptide     Status: Abnormal   Collection Time: 10/30/23  3:04 AM  Result Value Ref Range   B Natriuretic Peptide 1,581.0 (H) 0.0 - 100.0 pg/mL    Comment: Performed at [facility], [address], [place], [place] [address]  Sedimentation rate     Status: Abnormal   Collection Time: 10/30/23  3:04 AM  Result Value Ref Range   Sed Rate 99 (H) 0 - 16 mm/hr    Comment: Performed at [facility], [address], [place], [place] [address]  C-reactive protein     Status: Abnormal   Collection Time: 10/30/23  3:04 AM  Result Value Ref Range   CRP 15.5 (H) <1.0 mg/dL    Comment: Performed at [facility], [address], [place], [place] [address]  TSH     Status: Abnormal   Collection Time: 10/30/23  3:04 AM  Result Value Ref Range   TSH 5.293 (H) 0.350 - 4.500 uIU/mL    Comment: Performed by a 3rd Generation assay with a functional sensitivity of <=0.01 uIU/mL. Performed at [facility], [address], [place], [place] [address]   T4, free     Status: None   Collection Time: 10/30/23  9:19 AM  Result Value Ref Range   Free T4 1.11 0.61 - 1.12 ng/dL    Comment: (NOTE) Biotin ingestion may interfere with free T4 tests. If the results are inconsistent with the TSH level, previous test results, or the clinical presentation, then consider biotin  interference. If needed, order repeat testing after stopping biotin. Performed at [facility], [address] [place], [place] [address]   Iron and TIBC     Status: Abnormal   Collection Time: 10/30/23  9:19 AM  Result Value Ref Range   Iron 16 (L) 45 - 182 ug/dL   TIBC 808 (L) 749 - 549 ug/dL   Saturation Ratios 8 (L) 17.9 - 39.5 %   UIBC 175 ug/dL    Comment: Performed at [facility], [address] [place], [place] [address]  Ferritin     Status: None   Collection Time: 10/30/23  9:19 AM  Result Value Ref Range   Ferritin 180 24 - 336 ng/mL    Comment: Performed at [facility], [address], [place], [place] [address]  Troponin I (High Sensitivity)     Status: Abnormal   Collection Time: 10/31/23  4:33 AM  Result Value Ref Range   Troponin I (High Sensitivity) 336 (HH) <18 ng/L    Comment: CRITICAL VALUE NOTED. VALUE IS CONSISTENT WITH PREVIOUSLY REPORTED/CALLED VALUE (NOTE) Elevated high sensitivity troponin I (hsTnI) values and significant  changes across serial measurements may suggest ACS but many other  chronic and acute conditions are known to elevate hsTnI results.  Refer to the Links section for chest pain algorithms and additional  guidance. Performed at [facility], [address], [place], [place] [address]   Basic metabolic panel     Status: Abnormal   Collection Time: 10/31/23  4:33 AM  Result Value Ref Range   Sodium 137 135 - 145 mmol/L   Potassium 3.6 3.5 -  5.1 mmol/L   Chloride 102 98 - 111 mmol/L   CO2 25 22 - 32 mmol/L   Glucose, Bld 93 70 - 99 mg/dL    Comment: Glucose reference range applies only to samples taken after fasting for at least 8 hours.   BUN 6 (L) 8 - 23 mg/dL   Creatinine, Ser 9.11 0.61 - 1.24 mg/dL   Calcium  8.9 8.9 - 10.3 mg/dL   GFR, Estimated >39 >39 mL/min    Comment: (NOTE) Calculated using the CKD-EPI Creatinine Equation (2021)    Anion gap 10 5 - 15    Comment: Performed at [facility], [address], [place], [place] [address]  CBC     Status: Abnormal   Collection Time: 10/31/23  4:33 AM  Result Value Ref Range   WBC 8.6 4.0 - 10.5 K/uL   RBC 2.95 (L) 4.22 - 5.81 MIL/uL   Hemoglobin 8.7 (L) 13.0 - 17.0 g/dL   HCT 72.8 (L) 60.9 - 47.9 %   MCV 91.9 80.0 - 100.0 fL   MCH 29.5 26.0 - 34.0 pg   MCHC 32.1 30.0 - 36.0 g/dL   RDW 84.0 (H) 88.4 - 84.4 %   Platelets 385 150 - 400 K/uL   nRBC 0.0 0.0 - 0.2 %    Comment: Performed at [facility], [address] [place], [place] [address]  C-reactive protein     Status: Abnormal   Collection Time: 10/31/23  4:33 AM  Result Value Ref Range   CRP 17.9 (H) <1.0 mg/dL    Comment: Performed at [facility], [address] [place], [place] [address]  Sedimentation rate     Status: Abnormal   Collection Time: 10/31/23  4:33 AM  Result Value Ref Range   Sed Rate 104 (H) 0 - 16 mm/hr    Comment: Performed at [facility], [address], [place], [place] [address]  Magnesium      Status: None   Collection Time: 10/31/23  4:33 AM  Result Value Ref Range   Magnesium  1.9 1.7 - 2.4 mg/dL    Comment: Performed at [facility], [address] [place], [place] [address]  Brain natriuretic peptide     Status: Abnormal   Collection Time: 10/31/23  4:33 AM  Result Value Ref Range   B Natriuretic Peptide 1,410.0 (H) 0.0 - 100.0 pg/mL    Comment: Performed at [facility], [address], [place], [place] [address]  Magnesium      Status: None   Collection Time: 10/31/23  8:10 AM  Result Value Ref Range   Magnesium  1.8 1.7 - 2.4 mg/dL    Comment: Performed at [facility], [address], [place], [place] [address]  CBC     Status: Abnormal   Collection Time: 10/31/23  8:10 AM  Result Value Ref Range   WBC 9.0 4.0 - 10.5 K/uL   RBC 3.16 (L) 4.22 - 5.81 MIL/uL   Hemoglobin 9.6 (L) 13.0 - 17.0 g/dL   HCT 71.1 (L) 60.9 - 47.9 %   MCV 91.1 80.0 - 100.0 fL   MCH 30.4 26.0 - 34.0 pg   MCHC 33.3 30.0 - 36.0  g/dL   RDW 84.0 (H) 88.4 - 84.4 %   Platelets 429 (H) 150 - 400 K/uL   nRBC 0.0 0.0 - 0.2 %    Comment: Performed at [facility], [address], [place],  [place] [address]  Renal function panel     Status: Abnormal   Collection Time: 10/31/23  8:10 AM  Result Value Ref Range   Sodium 137 135 - 145 mmol/L   Potassium 3.4 (L) 3.5 - 5.1 mmol/L   Chloride 102 98 - 111 mmol/L   CO2 25 22 - 32 mmol/L   Glucose, Bld 100 (H) 70 - 99 mg/dL    Comment: Glucose reference range applies only to samples taken after fasting for at least 8 hours.   BUN 7 (L) 8 - 23 mg/dL   Creatinine, Ser 9.09 0.61 - 1.24 mg/dL   Calcium  8.9 8.9 - 10.3 mg/dL   Phosphorus 3.6 2.5 - 4.6 mg/dL   Albumin  2.0 (L) 3.5 - 5.0 g/dL   GFR, Estimated >39 >39 mL/min    Comment: (NOTE) Calculated using the CKD-EPI Creatinine Equation (2021)    Anion gap 10 5 - 15    Comment: Performed at [facility], [address], [place], [place] [address]    ECG   N/A  Telemetry   Sinus rhythm with frequent PAC's and PVC's, occasional ventricular bigeminy - Personally Reviewed  Radiology    ECHOCARDIOGRAM LIMITED Result Date: 10/29/2023    ECHOCARDIOGRAM LIMITED REPORT   Patient Name:   SHAMEER MOLSTAD Date of Exam: 10/29/2023 Medical Rec #:  [id number]       Height:       72.0 in Accession #:    [id number]      Weight:       165.3 lb Date of Birth:  1946/03/03       BSA:          1.965 m Patient Age:    77 years        BP:           176/89 mmHg Patient Gender: M               HR:           90 bpm. Exam Location:  [facility] Procedure: Limited Echo Indications:    LVEF  History:        Patient has prior history of Echocardiogram examinations, most                 recent 09/29/2023. CHF and Cardiomyopathy; Risk                 Factors:Hypertension.  Sonographer:    Jayson Gaskins Referring Phys: [id number] ERIC J AUSTRIA IMPRESSIONS  1. No doppler analysis done.  2. Left ventricular ejection fraction, by estimation, is 55 to 60%. The  left ventricle has normal function. The left ventricle has no regional wall motion abnormalities. There is mild concentric left ventricular hypertrophy.  3. Right ventricular systolic function is normal. The right ventricular size is normal.  4. Left atrial size was moderately dilated.  5. The mitral valve is grossly normal.  6. The aortic valve is tricuspid. No aortic stenosis is present.  7. The inferior vena cava is normal in size with greater than 50% respiratory variability, suggesting right atrial pressure of 3 mmHg. Comparison(s): The left ventricular function is unchanged. FINDINGS  Left Ventricle: Left ventricular ejection fraction, by estimation, is 55 to 60%. The left ventricle has normal function. The left ventricle has no regional wall motion abnormalities. The left ventricular internal cavity size was normal in size. There is  mild concentric left ventricular hypertrophy. Right Ventricle: The right ventricular size is normal. Right  vetricular wall thickness was not assessed. Right ventricular systolic function is normal. Left Atrium: Left atrial size was moderately dilated. Right Atrium: Right atrial size was normal in size. Pericardium: There is no evidence of pericardial effusion. Mitral Valve: The mitral valve is grossly normal. Tricuspid Valve: The tricuspid valve is grossly normal. Aortic Valve: The aortic valve is tricuspid. No aortic stenosis is present. Pulmonic Valve: The pulmonic valve was grossly normal. Venous: The inferior vena cava is normal in size with greater than 50% respiratory variability, suggesting right atrial pressure of 3 mmHg. IAS/Shunts: The interatrial septum was not assessed. LEFT VENTRICLE PLAX 2D LVIDd:         4.80 cm LVIDs:         3.30 cm LV PW:         1.30 cm LV IVS:        1.20 cm  LEFT ATRIUM             Index LA Vol (A2C):   72.6 ml 36.95 ml/m LA Vol (A4C):   75.8 ml 38.57 ml/m LA Biplane Vol: 74.9 ml 38.12 ml/m Vina Gull MD Electronically signed by Vina Gull  MD Signature Date/Time: 10/29/2023/3:30:59 PM    Final     Cardiac Studies   See echo above  Assessment   Principal Problem:   Acute myocarditis Active Problems:   Gout   GERD without esophagitis   Dyslipidemia   Non-ST elevation (NSTEMI) myocardial infarction [facility])   Plan   Mr. Kollman presented with several days of chest pain and found to have typical rise/fall of troponin as seen with NSTEMI. This is complicated by fever, elevated BNP and elevated ESR/CRP, but no clear source of infection. Has been now afebrile for 48 hrs. No further chest pain. Echo shows preserved LVEF, no regional WMA's and no pericardial effusion. I suspect this was NSTEMI - although chest pain was atypical, he has risk factors and given age, more likely to have CAD. Would recommend proceeding with LHC first - if there is CAD, then will manage. If non-obstructive, then plan to arrange a cMRI this admission. Continue IV heparin  for now.  Time Spent Directly with Patient:  I have spent a total of 35 minutes with the patient reviewing hospital notes, telemetry, EKGs, labs and examining the patient as well as establishing an assessment and plan that was discussed personally with the patient.  > 50% of time was spent in direct patient care.  Length of Stay:  LOS: 3 days   Vinie KYM Maxcy, MD, [facility], FACP  [place]  [facility]  Medical Director of the Advanced Lipid Disorders &  Cardiovascular Risk Reduction Clinic Diplomate of the American Board of Clinical Lipidology Attending Cardiologist  Direct Dial: [phone]  Fax: [phone]  Website:  [email or web address]  Vinie BROCKS Solmon Bohr 10/31/2023, 10:24 AM

## 2023-10-31 NOTE — Plan of Care (Signed)
Updated patient sister over the phone.

## 2023-10-31 NOTE — Progress Notes (Signed)
   Plan is for left cardiac catheterization tomorrow. Please see Dr. Katharina progress notes from earlier today for more information.  Shared Decision Making/Informed Consent The risks [stroke (1 in 1000), death (1 in 1000), kidney failure [usually temporary] (1 in 500), bleeding (1 in 200), allergic reaction [possibly serious] (1 in 200)], benefits (diagnostic support and management of coronary artery disease) and alternatives of a cardiac catheterization were discussed in detail with Don [name] and he is willing to proceed.  [name] [name], PA-C 10/31/2023 4:17 PM

## 2023-10-31 NOTE — Progress Notes (Signed)
   Informed by radiology that the patient could not tolerate cardiac MRI so the procedure was cancelled. He has known LM and 3 vessel coronary calcification based on abdominal CT imaging in 09/2023. Given troponin with typical rise and fall of NSTEMI, will proceed with coronary angiography tomorrow.  Vinie KYM Maxcy, MD, [facility], FACP  [place]  [facility]  Medical Director of the Advanced Lipid Disorders &  Cardiovascular Risk Reduction Clinic Diplomate of the American Board of Clinical Lipidology Attending Cardiologist  Direct Dial: [phone]  Fax: [phone]  Website:  [email or web address]

## 2023-11-01 ENCOUNTER — Inpatient Hospital Stay (HOSPITAL_COMMUNITY): Payer: 59

## 2023-11-01 DIAGNOSIS — I3139 Other pericardial effusion (noninflammatory): Secondary | ICD-10-CM

## 2023-11-01 DIAGNOSIS — I214 Non-ST elevation (NSTEMI) myocardial infarction: Secondary | ICD-10-CM | POA: Diagnosis not present

## 2023-11-01 DIAGNOSIS — I400 Infective myocarditis: Secondary | ICD-10-CM | POA: Diagnosis not present

## 2023-11-01 DIAGNOSIS — I5032 Chronic diastolic (congestive) heart failure: Secondary | ICD-10-CM | POA: Diagnosis not present

## 2023-11-01 LAB — CBC WITH DIFFERENTIAL/PLATELET
Abs Immature Granulocytes: 0.04 10*3/uL (ref 0.00–0.07)
Basophils Absolute: 0 10*3/uL (ref 0.0–0.1)
Basophils Relative: 0 %
Eosinophils Absolute: 0.2 10*3/uL (ref 0.0–0.5)
Eosinophils Relative: 2 %
HCT: 25.2 % — ABNORMAL LOW (ref 39.0–52.0)
Hemoglobin: 8.1 g/dL — ABNORMAL LOW (ref 13.0–17.0)
Immature Granulocytes: 0 %
Lymphocytes Relative: 10 %
Lymphs Abs: 0.9 10*3/uL (ref 0.7–4.0)
MCH: 30.1 pg (ref 26.0–34.0)
MCHC: 32.1 g/dL (ref 30.0–36.0)
MCV: 93.7 fL (ref 80.0–100.0)
Monocytes Absolute: 0.8 10*3/uL (ref 0.1–1.0)
Monocytes Relative: 9 %
Neutro Abs: 7.1 10*3/uL (ref 1.7–7.7)
Neutrophils Relative %: 79 %
Platelets: 375 10*3/uL (ref 150–400)
RBC: 2.69 MIL/uL — ABNORMAL LOW (ref 4.22–5.81)
RDW: 15.8 % — ABNORMAL HIGH (ref 11.5–15.5)
WBC: 9 10*3/uL (ref 4.0–10.5)
nRBC: 0 % (ref 0.0–0.2)

## 2023-11-01 LAB — CBC
HCT: 23.1 % — ABNORMAL LOW (ref 39.0–52.0)
Hemoglobin: 7.7 g/dL — ABNORMAL LOW (ref 13.0–17.0)
MCH: 30.1 pg (ref 26.0–34.0)
MCHC: 33.3 g/dL (ref 30.0–36.0)
MCV: 90.2 fL (ref 80.0–100.0)
Platelets: 336 10*3/uL (ref 150–400)
RBC: 2.56 MIL/uL — ABNORMAL LOW (ref 4.22–5.81)
RDW: 15.8 % — ABNORMAL HIGH (ref 11.5–15.5)
WBC: 8.5 10*3/uL (ref 4.0–10.5)
nRBC: 0 % (ref 0.0–0.2)

## 2023-11-01 LAB — TECHNOLOGIST SMEAR REVIEW: Plt Morphology: NORMAL

## 2023-11-01 LAB — COMPREHENSIVE METABOLIC PANEL
ALT: 20 U/L (ref 0–44)
AST: 26 U/L (ref 15–41)
Albumin: 1.7 g/dL — ABNORMAL LOW (ref 3.5–5.0)
Alkaline Phosphatase: 121 U/L (ref 38–126)
Anion gap: 9 (ref 5–15)
BUN: 13 mg/dL (ref 8–23)
CO2: 24 mmol/L (ref 22–32)
Calcium: 8.9 mg/dL (ref 8.9–10.3)
Chloride: 107 mmol/L (ref 98–111)
Creatinine, Ser: 0.98 mg/dL (ref 0.61–1.24)
GFR, Estimated: >60 mL/min (ref >60)
Glucose, Bld: 129 mg/dL — ABNORMAL HIGH (ref 70–99)
Potassium: 3.8 mmol/L (ref 3.5–5.1)
Sodium: 140 mmol/L (ref 135–145)
Total Bilirubin: 0.6 mg/dL (ref 0.0–1.2)
Total Protein: 5.2 g/dL — ABNORMAL LOW (ref 6.5–8.1)

## 2023-11-01 LAB — HEMOGLOBIN AND HEMATOCRIT, BLOOD
HCT: 23.5 % — ABNORMAL LOW (ref 39.0–52.0)
Hemoglobin: 7.6 g/dL — ABNORMAL LOW (ref 13.0–17.0)

## 2023-11-01 LAB — LACTATE DEHYDROGENASE: LDH: 169 U/L (ref 98–192)

## 2023-11-01 LAB — CK: Total CK: 23 U/L — ABNORMAL LOW (ref 49–397)

## 2023-11-01 LAB — SEDIMENTATION RATE: Sed Rate: 103 mm/h — ABNORMAL HIGH (ref 0–16)

## 2023-11-01 LAB — MAGNESIUM: Magnesium: 1.8 mg/dL (ref 1.7–2.4)

## 2023-11-01 LAB — C-REACTIVE PROTEIN: CRP: 19.3 mg/dL — ABNORMAL HIGH (ref <1.0)

## 2023-11-01 MED ORDER — SPIRONOLACTONE 12.5 MG HALF TABLET
12.5000 mg | ORAL_TABLET | Freq: Every day | ORAL | Status: DC
  Administered 2023-11-01 – 2023-11-09 (×9): 12.5 mg via ORAL
  Filled 2023-11-01 (×9): qty 1

## 2023-11-01 MED ORDER — SODIUM CHLORIDE 0.9 % WEIGHT BASED INFUSION
10.0000 mL/h | INTRAVENOUS | Status: DC
  Administered 2023-11-01: 10 mL/h via INTRAVENOUS

## 2023-11-01 MED ORDER — GADOBUTROL 1 MMOL/ML IV SOLN
8.0000 mL | Freq: Once | INTRAVENOUS | Status: AC | PRN
  Administered 2023-11-01: 8 mL via INTRAVENOUS

## 2023-11-01 NOTE — TOC Progression Note (Addendum)
 Transition of Care [facility]) - Progression Note    Patient Details  Name: Don [name] MRN: [id number] Date of Birth: 10-21-1945  Transition of Care [facility]) CM/SW Contact  Isaiah Public, LCSWA Phone Number: 11/01/2023, 11:15 AM  Clinical Narrative:     Patients insurance requested additional clinicals current PT/OT notes and current progress notes for insurance determination for SNF. CSW submitted requested clinicals. Insurance requested OT note.CSW will submit OT note once complete. CSW will continue to follow.  Expected Discharge Plan: Skilled Nursing Facility Barriers to Discharge: Continued Medical Work up  Expected Discharge Plan and Services In-house Referral: Clinical Social Work     Living arrangements for the past 2 months: Skilled Nursing Facility                                       Social Determinants of Health (SDOH) Interventions SDOH Screenings   Food Insecurity: Patient Unable To Answer (10/30/2023)  Housing: Low Risk  (10/14/2023)  Transportation Needs: No Transportation Needs (10/14/2023)  Utilities: Not At Risk (10/14/2023)  Social Connections: Unknown (10/31/2023)  Tobacco Use: Medium Risk (10/28/2023)    Readmission Risk Interventions    10/29/2023   10:25 AM 10/14/2023   11:02 AM  Readmission Risk Prevention Plan  Transportation Screening Complete Complete  HRI or Home Care Consult  Complete  Social Work Consult for Recovery Care Planning/Counseling  Complete  Palliative Care Screening  Not Applicable  Medication Review Oceanographer) Complete Complete  HRI or Home Care Consult Complete   SW Recovery Care/Counseling Consult Complete   Palliative Care Screening Not Applicable   Skilled Nursing Facility Complete

## 2023-11-01 NOTE — Consult Note (Signed)
 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 Kathrin.   Don [name] is a 78 y.o. 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   Seen in the ED numerous times over the last 5 month (>8x) for various reasons including nausea, syncope, drug-induced gout, neuropathy, and hypotension.   Recent admission 09/28/23 for hypotension and syncope. Repeat echo this admission showed EF 55-60% with normal RV. He was restarted on GDMT and discharge to SNF on 10/03/23.  Re-admitted 10/13/23 for dehydration in the setting of failure to thrive. He was started and discharged back to SNF on Midodrine  5 mg tid.   Returned to [facility] 10/28/23 from SNF for fever and chest pain. On presentation was found to be in SVT in the 150s and was given 1L IVF bolus with reduction in HR. Other vitals were BP 125/76 and SpO2 96%. Labs notable for WBC 9.7, hgb 8.3, plt 381, Na 136, K 3.7, Cr 0.9, BNP 1224, hs-trop 77>597>1084>779, elevated CRP and ESR. Respiratory panel negative. Bcx NGTD. Empiric antibiotics initiated, but have been de-escalated due to negative infectious work up. Echo obtained which showed again EF 55 to 60% w/o pericardial effusion. Cardiology was consulted for further evaluation due to concern for acute myocarditis. She was taken for cardiac MRI, however was unable to tolerate the procedure, so was cancelled. cMRI re-attempted and completed today which showed myocardial edema and acute myocarditis, as well as dense LGE in the basal septum and inferior walls, looks like nonischemic scar pattern, could potentially be concerning for sarcoid. Has continued with fevers  intermittently.  Advanced Heart Failure consulted for further evaluation.  Home Medications Prior to Admission medications   Medication Sig Start Date End Date Taking? Authorizing Provider  ACETAMINOPHEN  ER PO Take 325 mg by mouth every 4 (four) hours as needed (general discomfort, elevated temp).   Yes [provider]  albuterol  (PROVENTIL ) (2.5 MG/3ML) 0.083% nebulizer solution Take 3 mLs (2.5 mg total) by nebulization every 4 (four) hours as needed for wheezing or shortness of breath. 10/16/23  Yes Johnson, Clanford L, MD  albuterol  (VENTOLIN  HFA) 108 (90 Base) MCG/ACT inhaler Inhale 2 puffs into the lungs every 4 (four) hours as needed for wheezing or shortness of breath. 08/20/23  Yes [provider]  allopurinol  (ZYLOPRIM ) 100 MG tablet Take 1.5 tablets (150 mg total) by mouth daily. 08/04/23 08/03/24 Yes Ricky Fines, MD  ascorbic acid  (VITAMIN C ) 500 MG tablet Take 1 tablet (500 mg total) by mouth daily with breakfast. 10/17/23  Yes Johnson, Clanford L, MD  aspirin  EC 81 MG tablet Take 81 mg by mouth daily.   Yes [provider]  calcitRIOL  (ROCALTROL ) 0.25 MCG capsule Take 0.25 mcg by mouth daily.   Yes [provider]  carvedilol  (COREG ) 3.125 MG tablet Take 3.125 mg by mouth 2 (two) times daily with a meal.   Yes [provider]  cholecalciferol  (VITAMIN D3) 25 MCG (1000 UNIT) tablet Take 1,000 Units by mouth daily.   Yes [provider]  colchicine  0.6 MG tablet Take 1.2 mg by mouth every 8 (eight) hours as needed (gout flare). 08/13/23  Yes [provider]  ferrous sulfate  325 (65 FE) MG tablet Take 1 tablet (325 mg total) by mouth daily with breakfast. 10/17/23  Yes Johnson, Clanford L, MD  midodrine  (PROAMATINE ) 5 MG tablet Take 1 tablet (5 mg total) by mouth 3 (three) times daily with meals. 10/16/23  Yes Johnson, Clanford L, MD  Multiple Vitamin (MULTIVITAMIN WITH MINERALS) TABS tablet Take 1 tablet by mouth daily. 10/17/23   Yes Johnson, Clanford L, MD  ondansetron  (ZOFRAN ) 4 MG tablet Take 1 tablet (4 mg total) by mouth every 6 (six) hours. Patient taking differently: Take 4 mg by mouth every 6 (six) hours as needed for nausea or vomiting. 07/24/23  Yes Beatty, Celeste A, PA-C  pantoprazole  (PROTONIX ) 40 MG tablet Take 1 tablet (40 mg total) by mouth daily. 08/05/23  Yes Ricky Fines, MD  rosuvastatin  (CRESTOR ) 5 MG tablet Take 5 mg by mouth daily.   Yes [provider]  Tiotropium Bromide  Monohydrate (SPIRIVA  RESPIMAT) 2.5 MCG/ACT AERS Inhale 2 puffs into the lungs daily.   Yes [provider]  cyanocobalamin  (VITAMIN B12) 1000 MCG/ML injection Inject 1 mL (1,000 mcg total) into the muscle See admin instructions. Inject 1000mcg into the muscle twice weekly on Monday, Friday for 4 weeks. Then inject 1000mcg once every 30 days starting on 12/08/23. 10/16/23   Vicci Afton CROME, MD  feeding supplement (ENSURE ENLIVE / ENSURE PLUS) LIQD Take 237 mLs by mouth 2 (two) times daily between meals. 10/16/23   Vicci Afton CROME, MD    Past Medical History: Past Medical History:  Diagnosis Date   Aortic atherosclerosis (HCC)    Cardiomyopathy (HCC)    a. EF 20 to 25% by echo in 03/2019.   Cerebrovascular disease    Chronic HFrEF (heart failure with reduced ejection fraction) (HCC)    Chronic kidney disease, stage 3a (HCC)    Cirrhosis (HCC)    Essential hypertension    Gout    Hepatitis C    Hyperlipidemia     Past Surgical History: Past Surgical History:  Procedure Laterality Date   HERNIA REPAIR Left     Family History: Family History  Problem Relation Age of Onset   Hypertension Sister     Social History: Social History   Socioeconomic History   Marital status: Single    Spouse name: Not on file   Number of children: Not on file   Years of education: Not on file   Highest education level: Not on file  Occupational History   Not on file  Tobacco Use   Smoking status: Former     Current packs/day: 0.00    Types: Cigarettes    Quit date: 09/29/2008    Years since quitting: 15.0   Smokeless tobacco: Never   Tobacco comments:    years ago  Vaping Use   Vaping status: Never Used  Substance and Sexual Activity   Alcohol use: No    Alcohol/week: 0.0 standard drinks of alcohol    Comment: none in several years.    Drug use: Not Currently    Comment: stopped 2-3 yrs ago   Sexual activity: Not on file  Other Topics Concern   Not on file  Social History Narrative   Not on file   Social Drivers of Health   Financial Resource Strain: Not on file  Food Insecurity: Patient Unable To Answer (10/30/2023)   Hunger Vital Sign    Worried About Running Out of Food in the Last Year: Patient unable to answer  Ran Out of Food in the Last Year: Patient unable to answer  Transportation Needs: No Transportation Needs (10/14/2023)   PRAPARE - Administrator, Civil Service (Medical): No    Lack of Transportation (Non-Medical): No  Physical Activity: Not on file  Stress: Not on file  Social Connections: Unknown (10/31/2023)   Social Connection and Isolation Panel [NHANES]    Frequency of Communication with Friends and Family: Patient unable to answer    Frequency of Social Gatherings with Friends and Family: Once a week    Attends Religious Services: Never    Database Administrator or Organizations: No    Attends Banker Meetings: Never    Marital Status: Never married    Allergies:  Allergies  Allergen Reactions   Penicillins Swelling    Objective:    Vital Signs:   Temp:  [98.4 F (36.9 C)-101.8 F (38.8 C)] 98.8 F (37.1 C) (02/06 1604) Pulse Rate:  [83-89] 89 (02/06 1604) Resp:  [16-23] 20 (02/06 1604) BP: (109-144)/(58-76) 144/76 (02/06 1604) SpO2:  [93 %-99 %] 98 % (02/06 1604) Last BM Date : 10/29/23  Weight change: Filed Weights   10/28/23 1856 10/30/23 1725  Weight: 75 kg 74.7 kg   Intake/Output:   Intake/Output  Summary (Last 24 hours) at 11/01/2023 1614 Last data filed at 11/01/2023 1300 Gross per 24 hour  Intake 0 ml  Output 400 ml  Net -400 ml    Physical Exam   General: Frail appearing. Failure to thrive Cardiac: JVP not visible. S1 and S2 present. No murmurs or rub. Extremities: Warm and dry. No rash, cyanosis.  No peripheral edema.  Neuro: Affect flat. Not sure how much patient understands. BLE weakness, unable to ambulate  Telemetry   SR 80-90s (personally reviewed)  EKG    Vent Bigeminy 72 bpm 10/28/23 (personally reviewed)  Labs   Basic Metabolic Panel: Recent Labs  Lab 10/29/23 0516 10/30/23 0304 10/31/23 0433 10/31/23 0810 11/01/23 0439  NA 138 137 137 137 140  K 3.9 3.9 3.6 3.4* 3.8  CL 104 102 102 102 107  CO2 24 22 25 25 24   GLUCOSE 96 87 93 100* 129*  BUN 10 9 6* 7* 13  CREATININE 0.79 0.79 0.88 0.90 0.98  CALCIUM  8.9 9.0 8.9 8.9 8.9  MG  --  1.5* 1.9 1.8 1.8  PHOS  --   --   --  3.6  --     Liver Function Tests: Recent Labs  Lab 10/28/23 1933 10/30/23 0304 10/31/23 0810 11/01/23 0439  AST 33 27  --  26  ALT 24 21  --  20  ALKPHOS 122 126  --  121  BILITOT 0.7 1.1  --  0.6  PROT 6.1* 6.4*  --  5.2*  ALBUMIN  2.3* 2.3* 2.0* 1.7*   No results for input(s): LIPASE, AMYLASE in the last 168 hours. No results for input(s): AMMONIA in the last 168 hours.  CBC: Recent Labs  Lab 10/28/23 1933 10/29/23 0516 10/30/23 0304 10/31/23 0433 10/31/23 0810 11/01/23 0439 11/01/23 1000  WBC 9.7 9.8 9.9 8.6 9.0 8.5  --   NEUTROABS 7.6  --   --   --   --   --   --   HGB 8.3* 9.2* 9.2* 8.7* 9.6* 7.7* 7.6*  HCT 25.8* 29.7* 29.7* 27.1* 28.8* 23.1* 23.5*  MCV 94.9 99.3 93.7 91.9 91.1 90.2  --   PLT 381 375 361 385 429* 336  --  Cardiac Enzymes: Recent Labs  Lab 10/31/23 0809 11/01/23 0439  CKTOTAL 33* 23*    BNP: BNP (last 3 results) Recent Labs    10/29/23 0822 10/30/23 0304 10/31/23 0433  BNP 1,224.0* 1,581.0* 1,410.0*    ProBNP (last  3 results) No results for input(s): PROBNP in the last 8760 hours.   CBG: No results for input(s): GLUCAP in the last 168 hours.  Coagulation Studies: No results for input(s): LABPROT, INR in the last 72 hours.   Imaging   MR CARDIAC MORPHOLOGY W WO CONTRAST Result Date: 11/01/2023 CLINICAL DATA:  46M with HFrEF, cirrhosis, CKD p/w NSTEMI. Troponin up to 1084. Also with fevers and elevated ESR, CRP. Echo with EF 55-60%. EXAM: CARDIAC MRI TECHNIQUE: The patient was scanned on a 1.5 Tesla Siemens magnet. A dedicated cardiac coil was used. Functional imaging was done using Fiesta sequences. 2,3, and 4 chamber views were done to assess for RWMA's. Modified Simpson's rule using a short axis stack was used to calculate an ejection fraction on a dedicated work [employer]. The patient received 8 cc of Gadavist . After 10 minutes inversion recovery sequences were used to assess for infiltration and scar tissue. Phase contrast velocity mapping was performed above the aortic and pulmonic valves CONTRAST:  8 cc  of Gadavist  FINDINGS: Left ventricle: -Asymmetric LV hypertrophy measuring 14mm in septum (9mm in posterior wall) -Mild dilatation -Moderate systolic dysfunction -Markedly elevated ECV (43%), native T1 (up to in basal inferior wall), and T2 (up to 64ms in basal anterior wall) -Dense LGE in basal septum and inferior walls, in addition to RV insertion sites. LV EF: 37% (Normal 49-79%) Absolute volumes: LV EDV: (Normal 95-215 mL) LV ESV: (Normal 25-85 mL) LV SV: 79mL (Normal 61-145 mL) CO: 6.7L/min (Normal 3.4-7.8 L/min) Indexed volumes: LV EDV: 131mL/sq-m (Normal 50-108 mL/sq-m) LV ESV: 12mL/sq-m (Normal 11-47 mL/sq-m) LV SV: 23mL/sq-m (Normal 33-72 mL/sq-m) CI: 3.4L/min/sq-m (Normal 1.8-4.2 L/min/sq-m) Right ventricle: Normal size with moderate systolic dysfunction RV EF:  39% (Normal 51-80%) Absolute volumes: RV EDV: (Normal 109-217 mL) RV ESV:  (Normal 23-91 mL) RV SV: 77mL (Normal 71-141 mL) CO: 6.5L/min (Normal 2.8-8.8 L/min) Indexed volumes: RV EDV: 122mL/sq-m (Normal 58-109 mL/sq-m) RV ESV: 39mL/sq-m (Normal 12-46 mL/sq-m) RV SV: 82mL/sq-m (Normal 38-71 mL/sq-m) CI: 3.3L/min/sq-m (Normal 1.7-4.2 L/min/sq-m) Left atrium: Moderate enlargement Right atrium: Mild enlargement Mitral valve: Mild regurgitation Aortic valve: Tricuspid. Trivial regurgitation Tricuspid valve: Mild regurgitation Pulmonic valve: No regurgitation Aorta: Normal proximal ascending aorta Pulmonary artery: Dilated main PA measuring 31mm Pericardium: Small effusion IMPRESSION: 1. Diffusely elevated myocardial T2 values (along with T1 and ECV), suggesting myocardial edema and meeting criteria for acute myocarditis. In addition, there is dense LGE in basal septum and inferior walls, along with RV insertion sites, which is a nonischemic scar pattern. Given multifocal LGE, while could be due to myocarditis, would also consider sarcoidosis on the differential and recommend chest CT to evaluate for pulmonary sarcoid and FDG-PET to evaluate for cardiac sarcoid. Finally, global ECV is markedly elevated (43%), which is in the amyloid range; suspect ECV elevation is due to diffuse myocardial edema in setting of myocarditis rather than amyloid, and would recommend repeating cardiac MRI in 3 months to reevaluate ECV once inflammation has resolved 2. Asymmetric LV hypertrophy measuring 14mm in septum (9mm in posterior wall), not meeting criteria for hypertrophic cardiomyopathy (<34mm) 3.  Mild LV dilatation with moderate systolic dysfunction (EF 37%) 4.  Normal RV size with moderate systolic dysfunction (EF  39%) 5.  Small pericardial effusion 6.  Dilated main pulmonary artery measuring 31mm Electronically Signed   By: Lonni Nanas M.D.   On: 11/01/2023 14:38   MR CARDIAC VELOCITY FLOW MAP Result Date: 11/01/2023 CLINICAL DATA:  31M with HFrEF, cirrhosis, CKD p/w NSTEMI. Troponin up to  1084. Also with fevers and elevated ESR, CRP. Echo with EF 55-60%. EXAM: CARDIAC MRI TECHNIQUE: The patient was scanned on a 1.5 Tesla Siemens magnet. A dedicated cardiac coil was used. Functional imaging was done using Fiesta sequences. 2,3, and 4 chamber views were done to assess for RWMA's. Modified Simpson's rule using a short axis stack was used to calculate an ejection fraction on a dedicated work [employer]. The patient received 8 cc of Gadavist . After 10 minutes inversion recovery sequences were used to assess for infiltration and scar tissue. Phase contrast velocity mapping was performed above the aortic and pulmonic valves CONTRAST:  8 cc  of Gadavist  FINDINGS: Left ventricle: -Asymmetric LV hypertrophy measuring 14mm in septum (9mm in posterior wall) -Mild dilatation -Moderate systolic dysfunction -Markedly elevated ECV (43%), native T1 (up to in basal inferior wall), and T2 (up to 64ms in basal anterior wall) -Dense LGE in basal septum and inferior walls, in addition to RV insertion sites. LV EF: 37% (Normal 49-79%) Absolute volumes: LV EDV: (Normal 95-215 mL) LV ESV: (Normal 25-85 mL) LV SV: 79mL (Normal 61-145 mL) CO: 6.7L/min (Normal 3.4-7.8 L/min) Indexed volumes: LV EDV: 146mL/sq-m (Normal 50-108 mL/sq-m) LV ESV: 42mL/sq-m (Normal 11-47 mL/sq-m) LV SV: 31mL/sq-m (Normal 33-72 mL/sq-m) CI: 3.4L/min/sq-m (Normal 1.8-4.2 L/min/sq-m) Right ventricle: Normal size with moderate systolic dysfunction RV EF:  39% (Normal 51-80%) Absolute volumes: RV EDV: (Normal 109-217 mL) RV ESV: (Normal 23-91 mL) RV SV: 77mL (Normal 71-141 mL) CO: 6.5L/min (Normal 2.8-8.8 L/min) Indexed volumes: RV EDV: 133mL/sq-m (Normal 58-109 mL/sq-m) RV ESV: 48mL/sq-m (Normal 12-46 mL/sq-m) RV SV: 53mL/sq-m (Normal 38-71 mL/sq-m) CI: 3.3L/min/sq-m (Normal 1.7-4.2 L/min/sq-m) Left atrium: Moderate enlargement Right atrium: Mild enlargement Mitral valve: Mild regurgitation Aortic valve:  Tricuspid. Trivial regurgitation Tricuspid valve: Mild regurgitation Pulmonic valve: No regurgitation Aorta: Normal proximal ascending aorta Pulmonary artery: Dilated main PA measuring 31mm Pericardium: Small effusion IMPRESSION: 1. Diffusely elevated myocardial T2 values (along with T1 and ECV), suggesting myocardial edema and meeting criteria for acute myocarditis. In addition, there is dense LGE in basal septum and inferior walls, along with RV insertion sites, which is a nonischemic scar pattern. Given multifocal LGE, while could be due to myocarditis, would also consider sarcoidosis on the differential and recommend chest CT to evaluate for pulmonary sarcoid and FDG-PET to evaluate for cardiac sarcoid. Finally, global ECV is markedly elevated (43%), which is in the amyloid range; suspect ECV elevation is due to diffuse myocardial edema in setting of myocarditis rather than amyloid, and would recommend repeating cardiac MRI in 3 months to reevaluate ECV once inflammation has resolved 2. Asymmetric LV hypertrophy measuring 14mm in septum (9mm in posterior wall), not meeting criteria for hypertrophic cardiomyopathy (<64mm) 3.  Mild LV dilatation with moderate systolic dysfunction (EF 37%) 4.  Normal RV size with moderate systolic dysfunction (EF 39%) 5.  Small pericardial effusion 6.  Dilated main pulmonary artery measuring 31mm Electronically Signed   By: Lonni Nanas M.D.   On: 11/01/2023 14:38   MR CARDIAC VELOCITY FLOW MAP Result Date: 11/01/2023 CLINICAL DATA:  31M with HFrEF, cirrhosis, CKD p/w NSTEMI. Troponin up to 1084. Also with fevers and elevated ESR,  CRP. Echo with EF 55-60%. EXAM: CARDIAC MRI TECHNIQUE: The patient was scanned on a 1.5 Tesla Siemens magnet. A dedicated cardiac coil was used. Functional imaging was done using Fiesta sequences. 2,3, and 4 chamber views were done to assess for RWMA's. Modified Simpson's rule using a short axis stack was used to calculate an ejection fraction  on a dedicated work [employer]. The patient received 8 cc of Gadavist . After 10 minutes inversion recovery sequences were used to assess for infiltration and scar tissue. Phase contrast velocity mapping was performed above the aortic and pulmonic valves CONTRAST:  8 cc  of Gadavist  FINDINGS: Left ventricle: -Asymmetric LV hypertrophy measuring 14mm in septum (9mm in posterior wall) -Mild dilatation -Moderate systolic dysfunction -Markedly elevated ECV (43%), native T1 (up to in basal inferior wall), and T2 (up to 64ms in basal anterior wall) -Dense LGE in basal septum and inferior walls, in addition to RV insertion sites. LV EF: 37% (Normal 49-79%) Absolute volumes: LV EDV: (Normal 95-215 mL) LV ESV: (Normal 25-85 mL) LV SV: 79mL (Normal 61-145 mL) CO: 6.7L/min (Normal 3.4-7.8 L/min) Indexed volumes: LV EDV: 171mL/sq-m (Normal 50-108 mL/sq-m) LV ESV: 20mL/sq-m (Normal 11-47 mL/sq-m) LV SV: 57mL/sq-m (Normal 33-72 mL/sq-m) CI: 3.4L/min/sq-m (Normal 1.8-4.2 L/min/sq-m) Right ventricle: Normal size with moderate systolic dysfunction RV EF:  39% (Normal 51-80%) Absolute volumes: RV EDV: (Normal 109-217 mL) RV ESV: (Normal 23-91 mL) RV SV: 77mL (Normal 71-141 mL) CO: 6.5L/min (Normal 2.8-8.8 L/min) Indexed volumes: RV EDV: 132mL/sq-m (Normal 58-109 mL/sq-m) RV ESV: 95mL/sq-m (Normal 12-46 mL/sq-m) RV SV: 36mL/sq-m (Normal 38-71 mL/sq-m) CI: 3.3L/min/sq-m (Normal 1.7-4.2 L/min/sq-m) Left atrium: Moderate enlargement Right atrium: Mild enlargement Mitral valve: Mild regurgitation Aortic valve: Tricuspid. Trivial regurgitation Tricuspid valve: Mild regurgitation Pulmonic valve: No regurgitation Aorta: Normal proximal ascending aorta Pulmonary artery: Dilated main PA measuring 31mm Pericardium: Small effusion IMPRESSION: 1. Diffusely elevated myocardial T2 values (along with T1 and ECV), suggesting myocardial edema and meeting criteria for acute myocarditis. In addition,  there is dense LGE in basal septum and inferior walls, along with RV insertion sites, which is a nonischemic scar pattern. Given multifocal LGE, while could be due to myocarditis, would also consider sarcoidosis on the differential and recommend chest CT to evaluate for pulmonary sarcoid and FDG-PET to evaluate for cardiac sarcoid. Finally, global ECV is markedly elevated (43%), which is in the amyloid range; suspect ECV elevation is due to diffuse myocardial edema in setting of myocarditis rather than amyloid, and would recommend repeating cardiac MRI in 3 months to reevaluate ECV once inflammation has resolved 2. Asymmetric LV hypertrophy measuring 14mm in septum (9mm in posterior wall), not meeting criteria for hypertrophic cardiomyopathy (<49mm) 3.  Mild LV dilatation with moderate systolic dysfunction (EF 37%) 4.  Normal RV size with moderate systolic dysfunction (EF 39%) 5.  Small pericardial effusion 6.  Dilated main pulmonary artery measuring 31mm Electronically Signed   By: Lonni Nanas M.D.   On: 11/01/2023 14:38    Medications:    Current Medications:  allopurinol   150 mg Oral Daily   ascorbic acid   500 mg Oral Q breakfast   aspirin  EC  81 mg Oral Daily   calcitRIOL   0.25 mcg Oral Daily   carvedilol   6.25 mg Oral BID WC   cholecalciferol   1,000 Units Oral Daily   cyanocobalamin   1,000 mcg Intramuscular Once per day on Monday Friday   diclofenac  Sodium  2 g Topical QID   feeding supplement  237  mL Oral BID BM   ferrous sulfate   325 mg Oral Q breakfast   multivitamin with minerals  1 tablet Oral Daily   pantoprazole   40 mg Oral Daily   rosuvastatin   5 mg Oral Daily   umeclidinium bromide   1 puff Inhalation Daily    Infusions:  sodium chloride  10 mL/hr (11/01/23 1000)   Patient Profile   Don [name] is a 78 y.o. 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   No admitted for acute  myocarditis and acute HF.   Assessment/Plan   Acute myocarditis - presented with atypical chest pain, fevers, and significantly elevated CRP, ERS, and hs-trop - respiratory panel negative. Bcx - negative - cMRI with reduced LVED 37% and evidence of acute myocarditis - no chest pain - symptom management  - tylenol  prn for fever  Acute on chronic systolic heart failure - EF down to 20-25 in 2020 with improvement to 45% in 2022 and 50% in 2023 - Repeat echo 2/5: EF 55-60% with nl RV - cMRI 2/6: LVEF 37, RVEF 39%. LGE in basal septum and inferior walls, nonischemic pattern could be d/t acute myocarditis or cardiac sarcoid. Could consider PET scan as outpatient, however I am not sure that it will effect treatment plan. - continue coreg  6.25 mg bid - start spiro 12.5 mg daily - Would be cautious with ARB/ARNi with history of syncope and recent need for midodrine .  - orthostatic vital signs negative.  - obtain chest CT prior to discharge with concern for sarcoid - [facility]. Will likely guide further management.  Paroxysmal SVT - present on admission to ED, resolved with IVF - frequent PVCs on tele - continue coreg   Fe deficiency Anemia - baseline hgb 9-11. Today hgb is 7.6 - no overt s/s of bleeding - Tsat 8, ferritin 180  - oral replacement per primary  Hypokalemia - supp for goal K>4 - start spiro as above  FTT - continue PT/OT  - with current condition and presentation, could consider Palliative Care consult for GOC discussion  Length of Stay: 4  Dimitra Woodstock, NP  11/01/2023, 4:14 PM  Advanced Heart Failure Team Pager [phone] (M-F; 7a - 5p)  Please contact [facility] for night-coverage after hours (4p -7a ) and weekends on [email or web address]

## 2023-11-01 NOTE — Progress Notes (Signed)
 OT Cancellation Note  Patient Details Name: Don [name] MRN: [id number] DOB: 1945/10/06   Cancelled Treatment:    Reason Eval/Treat Not Completed: Patient at procedure or test/ unavailable off unit for cardiac MRI. Will follow up for OT eval as pt available.   [name]  [name] 11/01/2023, 12:20 PM

## 2023-11-01 NOTE — Progress Notes (Signed)
 PROGRESS NOTE  Don [name] FMW:[id number] DOB: 11-01-1945   PCP: [facility]  Patient is from: SNF.  Nonambulatory at baseline.  DOA: 10/28/2023 LOS: 4  Chief complaints Chief Complaint  Patient presents with   Chest Pain     Brief Narrative / Interim history: 78 year old M with PMH of HFimpEF, liver cirrhosis, hepatitis C, hypotension on midodrine , anemia, HTN, HLD and gout brought to [facility] by EMS from [facility] on 2/2 due to acute onset substernal chest pain and fever.    In the ED, temp 101.8 F, HR 160, RR 20, BP 175/105, SpO2 95% on room air.  WBC 9.7, hemoglobin 8.3, platelet count 381.  CMP without significant finding.  Trop 77>597.  EKG notable for supraventricular tachycardia, rate 158. Lactic acid 1.2.  COVID/influenza/RSV PCR negative.  UA and CXR without significant finding. Blood cultures x 2 obtained.  Started on broad-spectrum antibiotics.  Cardiology consulted.  Admission requested for acute myocarditis.  Patient was evaluated by cardiology who recommended transfer to [facility] for further cardiac evaluation  SVT and fever resolved.  RVP and blood culture negative.  Antibiotics discontinued.  Elevated inflammatory markers, intermittent fever and acute on chronic anemia without explanation.   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  Other differential diagnosis including sarcoidosis, and a small pericardial effusion.   Subjective: Seen and examined earlier this morning, and later this afternoon.  Events overnight of this morning.  Patient has no complaints.  He denies pain, shortness of breath, dizziness, GI or UTI symptoms.  Hemoglobin dropped about 2 g.  No report of overt bleeding.  Objective: Vitals:   10/31/23 2355 11/01/23 0355 11/01/23 0600 11/01/23 0719  BP: 131/66 (!) 109/58  133/74  Pulse: 86 87  85  Resp: (!) 22 20  20   Temp: 99 F (37.2 C)  98.9 F (37.2 C) 99.4 F (37.4 C)  TempSrc: Oral  Oral Oral  SpO2: 97%  97%  99%  Weight:      Height:        Examination:  GENERAL: No apparent distress.  Nontoxic. HEENT: MMM.  Vision and hearing grossly intact.  NECK: Supple.  No apparent JVD.  RESP:  No IWOB.  Fair aeration bilaterally. CVS:  RRR. Heart sounds normal.  ABD/GI/GU: BS+. Abd soft, NTND.  MSK/EXT:  Moves extremities. No apparent deformity. No edema.  Tenderness at the base of left great toe without overlying skin erythema, swelling or signs of inflammation. SKIN: no apparent skin lesion or wound NEURO: Awake, alert and oriented appropriately.  No apparent focal neuro deficit. PSYCH: Calm. Normal affect.   Procedures:  None  Microbiology summarized: COVID-19, influenza and RSV PCR nonreactive A 20 pathogen RVP nonreactive Blood cultures negative  Assessment and plan: Acute myocarditis/suspected non-STEMI: Presented with acute substernal chest pain and fever.  Troponin 77>>> 1600>> 336 suggesting ACS.  Limited TTE without significant finding.  Infectious workup unrevealing.  Pro-Cal negative.  CRP elevated to 15>> 18.  ESR elevated to 90>> 104.  ANA normal.  Cardiac MRI concerning for acute myocarditis. -Cardiology board-LHC canceled due to anemia -Follow SPEP. -Continue low-dose aspirin    Supraventricular tachycardia: HR elevated to 160s on arrival.  Previous history of intolerance to beta-blocker due to bradycardia.  Resolved -Continue Coreg .  Tolerating. -Optimize electrolytes. -Continue telemetry   Fever: Unclear source.  Infectious workup including CXR, UA, cultures and viral panel unrevealing.  Antibiotics discontinued. Inflammatory markers markedly elevated.  MRI suggestive of acute myocarditis.  Left foot tenderness without signs of infection. -Left foot x-ray -Repeat CRP and ESR  Chronic HFimpEF: Limited TTE with LVEF of 55 to 60% (previously 20 to 25%)., no RWMA.  BNP elevated to 1600.  Appears euvolemic on exam.  Currently without cardiopulmonary symptoms.  Not on  diuretics.  Cardiac MRI as above. -Continue Coreg  as above -Did not tolerate GDMT in the past due to orthostatic hypotension. -Strict intake and output, daily weight and renal functions  History of gout without acute flareup: While he is unusually high dose colchicine .  He is also on a statin which could increase his risk of rhabdo.  CK and uric acid not elevated. -Decreased colchicine  to 0.6 mg daily as needed. -Continue home allopurinol .  Acute on chronic anemia: Although Hgb dropped about 2 g in the last 24 hours, his baseline Hgb seems to be about 8.  No overt bleeding. Recent Labs    10/13/23 2019 10/14/23 0636 10/15/23 0520 10/28/23 1933 10/29/23 0516 10/30/23 0304 10/31/23 0433 10/31/23 0810 11/01/23 0439 11/01/23 1000  HGB 8.1* 8.3* 7.9* 8.3* 9.2* 9.2* 8.7* 9.6* 7.7* 7.6*  -Check Hemoccult and hemolysis lab -Discontinue subcu Lovenox  -SCD for VTE prophylaxis  Significantly elevated CRP and ESR: Unclear source of this.  No clear source of infection or inflammation.  He seems to have acute myocarditis.  Unsure if this is contributing. -Recheck CRP and ESR  History of liver cirrhosis: Compensated.  Euthyroid sick syndrome: Mild elevated TSH with normal free T4. -Repeat in 4 to 6 weeks  CKD 3A ruled out.  Hyperlipidemia -Continue Crestor  5 mg p.o. daily  History of hepatitis C Outpatient follow-up with gastroenterology/infectious disease.  Bilateral leg pain: Exam reassuring except for left foot tenderness.  No signs of infection. -Tylenol  as needed -Voltaren  gel -Left foot x-ray  Ambulatory dysfunction: Patient reports using chairs with wheels to get around.  -PT/OT eval   GERD -Protonix  40 mg p.o. daily   Body mass index is 22.34 kg/m.           DVT prophylaxis:  Place and maintain sequential compression device Start: 11/01/23 1234  Code Status: Full code Family Communication: Updated patient's sister over the phone on 2/5.  None at bedside  today. Level of care: Progressive Status is: Inpatient Remains inpatient appropriate because: Non-STEMI/elevated troponin/myocarditis   Final disposition: Likely back to SNF Consultants:  Cardiology  55 minutes with more than 50% spent in reviewing records, counseling patient/family and coordinating care.   Sch Meds:  Scheduled Meds:  allopurinol   150 mg Oral Daily   ascorbic acid   500 mg Oral Q breakfast   aspirin  EC  81 mg Oral Daily   calcitRIOL   0.25 mcg Oral Daily   carvedilol   6.25 mg Oral BID WC   cholecalciferol   1,000 Units Oral Daily   cyanocobalamin   1,000 mcg Intramuscular Once per day on Monday Friday   diclofenac  Sodium  2 g Topical QID   feeding supplement  237 mL Oral BID BM   ferrous sulfate   325 mg Oral Q breakfast   multivitamin with minerals  1 tablet Oral Daily   pantoprazole   40 mg Oral Daily   rosuvastatin   5 mg Oral Daily   umeclidinium bromide   1 puff Inhalation Daily   Continuous Infusions:  sodium chloride  10 mL/hr (11/01/23 1000)   PRN Meds:.acetaminophen  **OR** acetaminophen , albuterol , colchicine , gadobutrol , hydrALAZINE , magnesium  hydroxide, midodrine , morphine  injection, nitroGLYCERIN , ondansetron  **OR** ondansetron  (ZOFRAN ) IV, traZODone   Antimicrobials: Anti-infectives (From admission, onward)  Start     Dose/Rate Route Frequency Ordered Stop   10/28/23 1930  vancomycin  (VANCOCIN ) IVPB 1000 mg/200 mL premix        1,000 mg 200 mL/hr over 60 Minutes Intravenous  Once 10/28/23 1922 10/28/23 2126   10/28/23 1930  ceFEPIme  (MAXIPIME ) 2 g in sodium chloride  0.9 % 100 mL IVPB        2 g 200 mL/hr over 30 Minutes Intravenous  Once 10/28/23 1922 10/28/23 2013        I have personally reviewed the following labs and images: CBC: Recent Labs  Lab 10/28/23 1933 10/29/23 0516 10/30/23 0304 10/31/23 0433 10/31/23 0810 11/01/23 0439 11/01/23 1000  WBC 9.7 9.8 9.9 8.6 9.0 8.5  --   NEUTROABS 7.6  --   --   --   --   --   --   HGB  8.3* 9.2* 9.2* 8.7* 9.6* 7.7* 7.6*  HCT 25.8* 29.7* 29.7* 27.1* 28.8* 23.1* 23.5*  MCV 94.9 99.3 93.7 91.9 91.1 90.2  --   PLT 381 375 361 385 429* 336  --    BMP &GFR Recent Labs  Lab 10/29/23 0516 10/30/23 0304 10/31/23 0433 10/31/23 0810 11/01/23 0439  NA 138 137 137 137 140  K 3.9 3.9 3.6 3.4* 3.8  CL 104 102 102 102 107  CO2 24 22 25 25 24   GLUCOSE 96 87 93 100* 129*  BUN 10 9 6* 7* 13  CREATININE 0.79 0.79 0.88 0.90 0.98  CALCIUM  8.9 9.0 8.9 8.9 8.9  MG  --  1.5* 1.9 1.8 1.8  PHOS  --   --   --  3.6  --    Estimated Creatinine Clearance: 66.7 mL/min (by C-G formula based on SCr of 0.98 mg/dL). Liver & Pancreas: Recent Labs  Lab 10/28/23 1933 10/30/23 0304 10/31/23 0810 11/01/23 0439  AST 33 27  --  26  ALT 24 21  --  20  ALKPHOS 122 126  --  121  BILITOT 0.7 1.1  --  0.6  PROT 6.1* 6.4*  --  5.2*  ALBUMIN  2.3* 2.3* 2.0* 1.7*   No results for input(s): LIPASE, AMYLASE in the last 168 hours. No results for input(s): AMMONIA in the last 168 hours. Diabetic: No results for input(s): HGBA1C in the last 72 hours. No results for input(s): GLUCAP in the last 168 hours. Cardiac Enzymes: Recent Labs  Lab 10/31/23 0809 11/01/23 0439  CKTOTAL 33* 23*   No results for input(s): PROBNP in the last 8760 hours. Coagulation Profile: Recent Labs  Lab 10/28/23 1933  INR 1.3*   Thyroid  Function Tests: Recent Labs    10/30/23 0304 10/30/23 0919  TSH 5.293*  --   FREET4  --  1.11   Lipid Profile: No results for input(s): CHOL, HDL, LDLCALC, TRIG, CHOLHDL, LDLDIRECT in the last 72 hours.  Anemia Panel: Recent Labs    10/30/23 0919  FERRITIN 180  TIBC 191*  IRON 16*   Urine analysis:    Component Value Date/Time   COLORURINE AMBER (A) 10/28/2023 1922   APPEARANCEUR CLEAR 10/28/2023 1922   LABSPEC 1.018 10/28/2023 1922   PHURINE 6.0 10/28/2023 1922   GLUCOSEU NEGATIVE 10/28/2023 1922   HGBUR NEGATIVE 10/28/2023 1922    BILIRUBINUR NEGATIVE 10/28/2023 1922   KETONESUR NEGATIVE 10/28/2023 1922   PROTEINUR NEGATIVE 10/28/2023 1922   UROBILINOGEN 1.0 02/01/2012 1300   NITRITE NEGATIVE 10/28/2023 1922   LEUKOCYTESUR NEGATIVE 10/28/2023 1922   Sepsis Labs: Invalid input(s): PROCALCITONIN,  LACTICIDVEN  Microbiology: Recent Results (from the past 240 hours)  Resp panel by RT-PCR (RSV, Flu A&B, Covid) Anterior Nasal Swab     Status: None   Collection Time: 10/28/23  7:22 PM   Specimen: Anterior Nasal Swab  Result Value Ref Range Status   SARS Coronavirus 2 by RT PCR NEGATIVE NEGATIVE Final    Comment: (NOTE) SARS-CoV-2 target nucleic acids are NOT DETECTED.  The SARS-CoV-2 RNA is generally detectable in upper respiratory specimens during the acute phase of infection. The lowest concentration of SARS-CoV-2 viral copies this assay can detect is 138 copies/mL. A negative result does not preclude SARS-Cov-2 infection and should not be used as the sole basis for treatment or other patient management decisions. A negative result may occur with  improper specimen collection/handling, submission of specimen other than nasopharyngeal swab, presence of viral mutation(s) within the areas targeted by this assay, and inadequate number of viral copies(<138 copies/mL). A negative result must be combined with clinical observations, patient history, and epidemiological information. The expected result is Negative.  Fact Sheet for Patients:  bloggercourse.com  Fact Sheet for Healthcare Providers:  seriousbroker.it  This test is no t yet approved or cleared by the [place]  FDA and  has been authorized for detection and/or diagnosis of SARS-CoV-2 by FDA under an Emergency Use Authorization (EUA). This EUA will remain  in effect (meaning this test can be used) for the duration of the COVID-19 declaration under Section 564(b)(1) of the Act, 21 U.S.C.section  360bbb-3(b)(1), unless the authorization is terminated  or revoked sooner.       Influenza A by PCR NEGATIVE NEGATIVE Final   Influenza B by PCR NEGATIVE NEGATIVE Final    Comment: (NOTE) The Xpert Xpress SARS-CoV-2/FLU/RSV plus assay is intended as an aid in the diagnosis of influenza from Nasopharyngeal swab specimens and should not be used as a sole basis for treatment. Nasal washings and aspirates are unacceptable for Xpert Xpress SARS-CoV-2/FLU/RSV testing.  Fact Sheet for Patients: bloggercourse.com  Fact Sheet for Healthcare Providers: seriousbroker.it  This test is not yet approved or cleared by the [place]  FDA and has been authorized for detection and/or diagnosis of SARS-CoV-2 by FDA under an Emergency Use Authorization (EUA). This EUA will remain in effect (meaning this test can be used) for the duration of the COVID-19 declaration under Section 564(b)(1) of the Act, 21 U.S.C. section 360bbb-3(b)(1), unless the authorization is terminated or revoked.     Resp Syncytial Virus by PCR NEGATIVE NEGATIVE Final    Comment: (NOTE) Fact Sheet for Patients: bloggercourse.com  Fact Sheet for Healthcare Providers: seriousbroker.it  This test is not yet approved or cleared by the [place]  FDA and has been authorized for detection and/or diagnosis of SARS-CoV-2 by FDA under an Emergency Use Authorization (EUA). This EUA will remain in effect (meaning this test can be used) for the duration of the COVID-19 declaration under Section 564(b)(1) of the Act, 21 U.S.C. section 360bbb-3(b)(1), unless the authorization is terminated or revoked.  Performed at [facility], [address] [place], [place] [address]   Blood Culture (routine x 2)     Status: None (Preliminary result)   Collection Time: 10/28/23  7:33 PM   Specimen: Left Antecubital; Blood  Result Value Ref  Range Status   Specimen Description LEFT ANTECUBITAL BLOOD  Final   Special Requests   Final    BOTTLES DRAWN AEROBIC AND ANAEROBIC Blood Culture adequate volume   Culture   Final  NO GROWTH 4 DAYS Performed at [facility], [address] [place], [place] [address]    Report Status PENDING  Incomplete  Blood Culture (routine x 2)     Status: None (Preliminary result)   Collection Time: 10/28/23  7:43 PM   Specimen: BLOOD RIGHT FOREARM  Result Value Ref Range Status   Specimen Description BLOOD RIGHT FOREARM BLOOD  Final   Special Requests   Final    BOTTLES DRAWN AEROBIC AND ANAEROBIC Blood Culture adequate volume   Culture   Final    NO GROWTH 4 DAYS Performed at [facility], [address] [place], [place] [address]    Report Status PENDING  Incomplete  Respiratory (~20 pathogens) panel by PCR     Status: None   Collection Time: 10/29/23  5:20 PM   Specimen: Nasopharyngeal Swab; Respiratory  Result Value Ref Range Status   Adenovirus NOT DETECTED NOT DETECTED Final   Coronavirus 229E NOT DETECTED NOT DETECTED Final    Comment: (NOTE) The Coronavirus on the Respiratory Panel, DOES NOT test for the novel  Coronavirus (2019 nCoV)    Coronavirus HKU1 NOT DETECTED NOT DETECTED Final   Coronavirus NL63 NOT DETECTED NOT DETECTED Final   Coronavirus OC43 NOT DETECTED NOT DETECTED Final   Metapneumovirus NOT DETECTED NOT DETECTED Final   Rhinovirus / Enterovirus NOT DETECTED NOT DETECTED Final   Influenza A NOT DETECTED NOT DETECTED Final   Influenza B NOT DETECTED NOT DETECTED Final   Parainfluenza Virus 1 NOT DETECTED NOT DETECTED Final   Parainfluenza Virus 2 NOT DETECTED NOT DETECTED Final   Parainfluenza Virus 3 NOT DETECTED NOT DETECTED Final   Parainfluenza Virus 4 NOT DETECTED NOT DETECTED Final   Respiratory Syncytial Virus NOT DETECTED NOT DETECTED Final   Bordetella pertussis NOT DETECTED NOT DETECTED Final   Bordetella Parapertussis NOT DETECTED NOT DETECTED Final    Chlamydophila pneumoniae NOT DETECTED NOT DETECTED Final   Mycoplasma pneumoniae NOT DETECTED NOT DETECTED Final    Comment: Performed at [facility], [address] [place], [place] [address]    Radiology Studies: No results found.    Limmie Schoenberg T. Charlesetta Milliron [facility]  If 7PM-7AM, please contact night-coverage [email or web address] 11/01/2023, 12:35 PM

## 2023-11-01 NOTE — Progress Notes (Addendum)
   Follow-up note - apparently the cMRI was performed, after initially being told the patient could not tolerate it.  The final result is consistent with acute myocarditis, LVEF 37%, RVEF 39%. I have asked the advanced heart failure service for their help with evaluation and management.  Vinie KYM Maxcy, MD, [facility], FACP  [place]  [facility]  Medical Director of the Advanced Lipid Disorders &  Cardiovascular Risk Reduction Clinic Diplomate of the American Board of Clinical Lipidology Attending Cardiologist  Direct Dial: [phone]  Fax: [phone]  Website:  [email or web address]

## 2023-11-01 NOTE — H&P (View-Only) (Signed)
   Follow-up note - apparently the cMRI was performed, after initially being told the patient could not tolerate it.  The final result is consistent with acute myocarditis, LVEF 37%, RVEF 39%. I have asked the advanced heart failure service for their help with evaluation and management.  Vinie KYM Maxcy, MD, [facility], FACP  [place]  [facility]  Medical Director of the Advanced Lipid Disorders &  Cardiovascular Risk Reduction Clinic Diplomate of the American Board of Clinical Lipidology Attending Cardiologist  Direct Dial: [phone]  Fax: [phone]  Website:  [email or web address]

## 2023-11-01 NOTE — Plan of Care (Signed)

## 2023-11-01 NOTE — Progress Notes (Addendum)
 Rounding Note    Patient Name: Don [name] Date of Encounter: 11/01/2023  [facility] Cardiologist: Jayson Sierras, MD   Subjective   He spiked another fever (101.8) last night.His biggest complaint today is pain in his leg. He states they hurt on the inside and he can't put weight on them or keep them still. He has KT kinesiology tape on both his feet which he states was placed at the exercise place. He denies pain anywhere else. No chest pain. He does report feeling a little short of breath this morning but looks comfortably. No lightheadedness or dizziness.   Hemoglobin is down from 9.6 yesterday to 7.7 today. He denies any abnormal bleeding in urine or stools. He denies any history of GI bleeding.  Inpatient Medications    Scheduled Meds:  allopurinol   150 mg Oral Daily   ascorbic acid   500 mg Oral Q breakfast   aspirin  EC  81 mg Oral Daily   calcitRIOL   0.25 mcg Oral Daily   carvedilol   6.25 mg Oral BID WC   cholecalciferol   1,000 Units Oral Daily   cyanocobalamin   1,000 mcg Intramuscular Once per day on Monday Friday   diclofenac  Sodium  2 g Topical QID   enoxaparin  (LOVENOX ) injection  40 mg Subcutaneous Q24H   feeding supplement  237 mL Oral BID BM   ferrous sulfate   325 mg Oral Q breakfast   multivitamin with minerals  1 tablet Oral Daily   pantoprazole   40 mg Oral Daily   rosuvastatin   5 mg Oral Daily   umeclidinium bromide   1 puff Inhalation Daily   Continuous Infusions:  sodium chloride  1 mL/kg/hr (11/01/23 0606)   PRN Meds: acetaminophen  **OR** acetaminophen , albuterol , colchicine , gadobutrol , hydrALAZINE , magnesium  hydroxide, midodrine , morphine  injection, nitroGLYCERIN , ondansetron  **OR** ondansetron  (ZOFRAN ) IV, traZODone    Vital Signs    Vitals:   10/31/23 2355 11/01/23 0355 11/01/23 0600 11/01/23 0719  BP: 131/66 (!) 109/58  133/74  Pulse: 86 87    Resp: (!) 22 20    Temp: 99 F (37.2 C)  98.9 F (37.2 C) 99.4 F (37.4 C)   TempSrc: Oral  Oral Oral  SpO2: 97% 97%    Weight:      Height:        Intake/Output Summary (Last 24 hours) at 11/01/2023 0725 Last data filed at 11/01/2023 0415 Gross per 24 hour  Intake 710 ml  Output 800 ml  Net -90 ml      10/30/2023    5:25 PM 10/28/2023    6:56 PM 10/14/2023    6:14 AM  Last 3 Weights  Weight (lbs) 164 lb 10.9 oz 165 lb 5.5 oz 164 lb 14.5 oz  Weight (kg) 74.7 kg 75 kg 74.8 kg      Telemetry    Normal sinus rhythm with rates in the 80s to 90s. PACs/ PVCs noted. - Personally Reviewed  ECG    No new ECG tracing this admission. - Personally Reviewed  Physical Exam   GEN: No acute distress.   Neck: No JVD. Cardiac: RRR. No murmurs, rubs, or gallops.  Respiratory: Clear to auscultation bilaterally. GI: Soft, non-distend, and non-tender. MS: No lower extremity edema. No deformity. Skin: Warm and dry. Neuro:  No focal deficits. Psych: Normal affect.  Labs    High Sensitivity Troponin:   Recent Labs  Lab 10/28/23 2137 10/29/23 0516 10/29/23 0822 10/29/23 1134 10/31/23 0433  TROPONINIHS 597* 1,084* 779* 580* 336*     Chemistry  Recent Labs  Lab 10/28/23 1933 10/29/23 0516 10/30/23 0304 10/31/23 0433 10/31/23 0810 11/01/23 0439  NA 136   < > 137 137 137 140  K 3.7   < > 3.9 3.6 3.4* 3.8  CL 101   < > 102 102 102 107  CO2 26   < > 22 25 25 24   GLUCOSE 98   < > 87 93 100* 129*  BUN 12   < > 9 6* 7* 13  CREATININE 0.90   < > 0.79 0.88 0.90 0.98  CALCIUM  8.7*   < > 9.0 8.9 8.9 8.9  MG  --    < > 1.5* 1.9 1.8 1.8  PROT 6.1*  --  6.4*  --   --  5.2*  ALBUMIN  2.3*  --  2.3*  --  2.0* 1.7*  AST 33  --  27  --   --  26  ALT 24  --  21  --   --  20  ALKPHOS 122  --  126  --   --  121  BILITOT 0.7  --  1.1  --   --  0.6  GFRNONAA >60   < > >60 >60 >60 >60  ANIONGAP 9   < > 13 10 10 9    < > = values in this interval not displayed.    Lipids  Recent Labs  Lab 10/29/23 0516  CHOL 94  TRIG 67  HDL 41  LDLCALC 40  CHOLHDL 2.3     Hematology Recent Labs  Lab 10/31/23 0433 10/31/23 0810 11/01/23 0439  WBC 8.6 9.0 8.5  RBC 2.95* 3.16* 2.56*  HGB 8.7* 9.6* 7.7*  HCT 27.1* 28.8* 23.1*  MCV 91.9 91.1 90.2  MCH 29.5 30.4 30.1  MCHC 32.1 33.3 33.3  RDW 15.9* 15.9* 15.8*  PLT 385 429* 336   Thyroid   Recent Labs  Lab 10/30/23 0304 10/30/23 0919  TSH 5.293*  --   FREET4  --  1.11    BNP Recent Labs  Lab 10/29/23 0822 10/30/23 0304 10/31/23 0433  BNP 1,224.0* 1,581.0* 1,410.0*    DDimer No results for input(s): DDIMER in the last 168 hours.   Radiology    No results found.  Cardiac Studies   Complete Echocardiogram 09/29/2023: Impressions: 1. Left ventricular ejection fraction, by estimation, is 55 to 60%. The  left ventricle has normal function. The left ventricle has no regional  wall motion abnormalities. There is mild left ventricular hypertrophy.  Left ventricular diastolic parameters  are consistent with Grade I diastolic dysfunction (impaired relaxation).   2. Right ventricular systolic function is normal. The right ventricular  size is normal. There is normal pulmonary artery systolic pressure.   3. The mitral valve is normal in structure. No evidence of mitral valve  regurgitation. No evidence of mitral stenosis.   4. The tricuspid valve is abnormal.   5. The aortic valve is tricuspid. There is mild calcification of the  aortic valve. There is mild thickening of the aortic valve. Aortic valve  regurgitation is mild. No aortic stenosis is present.   6. Aortic dilatation noted. There is mild dilatation of the ascending  aorta, measuring 38 mm.   7. The inferior vena cava is normal in size with greater than 50%  respiratory variability, suggesting right atrial pressure of 3 mmHg.  _______________  Limited Echocardiogram 10/29/2023: Impressions: 1. No doppler analysis done.   2. Left ventricular ejection fraction, by estimation, is 55 to 60%. The  left ventricle has normal function.  The left ventricle has no regional  wall motion abnormalities. There is mild concentric left ventricular  hypertrophy.   3. Right ventricular systolic function is normal. The right ventricular  size is normal.   4. Left atrial size was moderately dilated.   5. The mitral valve is grossly normal.   6. The aortic valve is tricuspid. No aortic stenosis is present.   7. The inferior vena cava is normal in size with greater than 50%  respiratory variability, suggesting right atrial pressure of 3 mmHg.     Patient Profile     78 y.o. male with a history of chronic HFrEF with EF as low as 20-25% in 03/2019 with subsequent normalization, hypertension, hyperlipidemia, CKD stage III, hepatitis C, liver cirrhosis, GERD, and gout. He was recently admitted in 09/2023 for dehydration in setting of poor PO intake and adult failure to thrive. Hospitalization was complicated by persistent hypotension despite IV fluids and holding of antihypertensives and he was discharged on Midodrine . He was readmitted on 10/28/2023 for NSTEMI and possible acute myocarditis after presenting to [facility] with acute onset substernal chest and upper abdominal pain as well as fever of 100.1.   Assessment & Plan    NSTEMI Possible Acute Myocarditis High-sensitivity troponin 77 >> 597 >> 1,084 >> 779 >> 580 >> 336. Troponin trend is consistent with ACS. However, Sed Rate and CRP continues to slowly increase - up to 104 and 17.9 respectively today. Echo shows LVEF of 55-60% with no regional wall motion abnormalities. Cardiac MRI was attempted yesterday but patient was unable to tolerate this. CT in 09/2022 does show left main and 3 vessel coronary calcification.  - No chest pain.  - He was not started on IV Heparin  due to concern that troponin elevation was due to acute myocarditis and atypical chest pain. - Continue aspirin , beta-blocker, statin. - He is scheduled for LHC later this afternoon. Consent for this yesterday.  However, hemoglobin this morning is down from 9.6 yesterday to 7.7 today. Will repeat STAT H&H to make sure this is accurate. He denies any overt bleeding. If repeat hemoglobin comes back unchanged, will discuss with MD about whether cath needs to be postponed.  Chronic HFrEF with Recovered EF LVEF as low as 20-25% in 2020 but normalized with GDMT. Presumed to be non-ischemic in nature. Echo this admission shows LVEF of 55-60% with mild LVH. BNP elevated this admission at 1,224 >> 1,581.  - He reports feeling a little of short of breath this morning. Euvolemic on exam. - Will decrease pre-cath fluids to KVO dose (39mL/hr). - GDMT previously limited by hypotension. He was started on Midodrine  during recent hospitalization in 09/2023 due to persistent hypotension but this has been able to be stopped here. - Continue Coreg  6.25mg  twice daily. - Will check orthostatic vital signs. If negative, we could consider slowly adding GDMT.  Paroxysmal SVT Patient had a brief episode of narrow complex tachycardia that looked like SVT while at [facility] which lasted for about 5 minutes.  - No recurrence. - Continue Coreg  as above.  Hypertension History of hypertension listed in chart but more recently has struggled with hypotension requiring Midodrine . Midodrine  has been stopped this admission. - He has occasional elevated BP readings but overall stable. No symptoms of hypotension this morning. - Continue Coreg  as above. - Will check orthostatic vital signs.   Hyperlipidemia Lipid panel this admission: Total Cholesterol 94, Triglycerides 67, HDL 41, LDL 40. -  Continue Crestor  5mg  daily.  CKD Stage III Creatinine stable around 0.9 this admission. GFR >60.  Normocytic Anemia Baseline hemoglobin in the 9 to 11 range.  Iron low at 16 but ferritin normal this admission. Vitamin B12 elevated at 1,427 and Folate normal in 09/2023. - Hemoglobin has fluctuated this admission but down from 9.6 yesterday  to 7.7  today. - No overt bleeding. He denies any history of GI bleeding. - Will repeat a STAT H&H given plans for cath this afternoon. - Management per primary team.  Otherwise, per primary team: - Fever - Hepatitis C - Liver cirrhosis - GERD - Gout - Hypoalbuminemia - Failure to thrive - Leg pain    For questions or updates, please contact [facility] Please consult [email or web address] for contact info under        Signed, Khalifa Knecht E Aleksa Catterton, PA-C  11/01/2023, 7:25 AM    ADDENDUM: Repeat hemoglobin came back at 7.6. Discussed with Dr. Jordan who recommended holding off on cath given drop in hemoglobin and fever last night. Will order FOBT and notify primary team.  Oral Remache E Ophie Burrowes, PA-C 11/01/2023 11:38 AM

## 2023-11-02 ENCOUNTER — Inpatient Hospital Stay (HOSPITAL_COMMUNITY): Payer: 59

## 2023-11-02 ENCOUNTER — Encounter (HOSPITAL_COMMUNITY): Payer: Self-pay | Admitting: Internal Medicine

## 2023-11-02 ENCOUNTER — Encounter (HOSPITAL_COMMUNITY)
Admission: EM | Disposition: A | Discharge status: Skilled Nursing Facility | Payer: Self-pay | Source: Skilled Nursing Facility | Attending: Student

## 2023-11-02 DIAGNOSIS — I5032 Chronic diastolic (congestive) heart failure: Secondary | ICD-10-CM | POA: Diagnosis not present

## 2023-11-02 DIAGNOSIS — I2510 Atherosclerotic heart disease of native coronary artery without angina pectoris: Secondary | ICD-10-CM

## 2023-11-02 DIAGNOSIS — I214 Non-ST elevation (NSTEMI) myocardial infarction: Secondary | ICD-10-CM | POA: Diagnosis not present

## 2023-11-02 DIAGNOSIS — I400 Infective myocarditis: Secondary | ICD-10-CM | POA: Diagnosis not present

## 2023-11-02 DIAGNOSIS — I408 Other acute myocarditis: Secondary | ICD-10-CM

## 2023-11-02 DIAGNOSIS — I5020 Unspecified systolic (congestive) heart failure: Secondary | ICD-10-CM

## 2023-11-02 HISTORY — PX: RIGHT/LEFT HEART CATH AND CORONARY ANGIOGRAPHY: CATH118266

## 2023-11-02 LAB — POCT I-STAT EG7
Acid-Base Excess: 0 mmol/L (ref 0.0–2.0)
Acid-base deficit: 3 mmol/L — ABNORMAL HIGH (ref 0.0–2.0)
Bicarbonate: 21.8 mmol/L (ref 20.0–28.0)
Bicarbonate: 24.3 mmol/L (ref 20.0–28.0)
Calcium, Ion: 1.14 mmol/L — ABNORMAL LOW (ref 1.15–1.40)
Calcium, Ion: 1.25 mmol/L (ref 1.15–1.40)
HCT: 24 % — ABNORMAL LOW (ref 39.0–52.0)
HCT: 24 % — ABNORMAL LOW (ref 39.0–52.0)
Hemoglobin: 8.2 g/dL — ABNORMAL LOW (ref 13.0–17.0)
Hemoglobin: 8.2 g/dL — ABNORMAL LOW (ref 13.0–17.0)
O2 Saturation: 59 %
O2 Saturation: 69 %
Potassium: 3.5 mmol/L (ref 3.5–5.1)
Potassium: 3.8 mmol/L (ref 3.5–5.1)
Sodium: 135 mmol/L (ref 135–145)
Sodium: 138 mmol/L (ref 135–145)
TCO2: 23 mmol/L (ref 22–32)
TCO2: 25 mmol/L (ref 22–32)
pCO2, Ven: 34.7 mm[Hg] — ABNORMAL LOW (ref 44–60)
pCO2, Ven: 36.5 mm[Hg] — ABNORMAL LOW (ref 44–60)
pH, Ven: 7.405 (ref 7.25–7.43)
pH, Ven: 7.432 — ABNORMAL HIGH (ref 7.25–7.43)
pO2, Ven: 30 mm[Hg] — CL (ref 32–45)
pO2, Ven: 35 mm[Hg] (ref 32–45)

## 2023-11-02 LAB — CBC
HCT: 23.6 % — ABNORMAL LOW (ref 39.0–52.0)
Hemoglobin: 7.6 g/dL — ABNORMAL LOW (ref 13.0–17.0)
MCH: 29.7 pg (ref 26.0–34.0)
MCHC: 32.2 g/dL (ref 30.0–36.0)
MCV: 92.2 fL (ref 80.0–100.0)
Platelets: 353 10*3/uL (ref 150–400)
RBC: 2.56 MIL/uL — ABNORMAL LOW (ref 4.22–5.81)
RDW: 15.9 % — ABNORMAL HIGH (ref 11.5–15.5)
WBC: 9.4 10*3/uL (ref 4.0–10.5)
nRBC: 0 % (ref 0.0–0.2)

## 2023-11-02 LAB — BASIC METABOLIC PANEL
Anion gap: 10 (ref 5–15)
BUN: 10 mg/dL (ref 8–23)
CO2: 24 mmol/L (ref 22–32)
Calcium: 8.8 mg/dL — ABNORMAL LOW (ref 8.9–10.3)
Chloride: 103 mmol/L (ref 98–111)
Creatinine, Ser: 0.88 mg/dL (ref 0.61–1.24)
GFR, Estimated: >60 mL/min (ref >60)
Glucose, Bld: 94 mg/dL (ref 70–99)
Potassium: 3.6 mmol/L (ref 3.5–5.1)
Sodium: 137 mmol/L (ref 135–145)

## 2023-11-02 LAB — CULTURE, BLOOD (ROUTINE X 2)
Culture: NO GROWTH
Culture: NO GROWTH
Special Requests: ADEQUATE
Special Requests: ADEQUATE

## 2023-11-02 LAB — POCT I-STAT 7, (LYTES, BLD GAS, ICA,H+H)
Acid-Base Excess: 0 mmol/L (ref 0.0–2.0)
Bicarbonate: 23.5 mmol/L (ref 20.0–28.0)
Calcium, Ion: 1.23 mmol/L (ref 1.15–1.40)
HCT: 23 % — ABNORMAL LOW (ref 39.0–52.0)
Hemoglobin: 7.8 g/dL — ABNORMAL LOW (ref 13.0–17.0)
O2 Saturation: 95 %
Potassium: 3.8 mmol/L (ref 3.5–5.1)
Sodium: 139 mmol/L (ref 135–145)
TCO2: 24 mmol/L (ref 22–32)
pCO2 arterial: 31.3 mm[Hg] — ABNORMAL LOW (ref 32–48)
pH, Arterial: 7.484 — ABNORMAL HIGH (ref 7.35–7.45)
pO2, Arterial: 68 mm[Hg] — ABNORMAL LOW (ref 83–108)

## 2023-11-02 SURGERY — RIGHT/LEFT HEART CATH AND CORONARY ANGIOGRAPHY
Anesthesia: LOCAL

## 2023-11-02 MED ORDER — HEPARIN SODIUM (PORCINE) 1000 UNIT/ML IJ SOLN
INTRAMUSCULAR | Status: AC
  Filled 2023-11-02: qty 10

## 2023-11-02 MED ORDER — ACETAMINOPHEN 325 MG PO TABS
650.0000 mg | ORAL_TABLET | ORAL | Status: DC | PRN
  Filled 2023-11-02: qty 2

## 2023-11-02 MED ORDER — MIDAZOLAM HCL 2 MG/2ML IJ SOLN
INTRAMUSCULAR | Status: DC | PRN
  Administered 2023-11-02: 1 mg via INTRAVENOUS

## 2023-11-02 MED ORDER — FUROSEMIDE 10 MG/ML IJ SOLN
INTRAMUSCULAR | Status: DC | PRN
  Administered 2023-11-02: 40 mg via INTRAVENOUS

## 2023-11-02 MED ORDER — LIDOCAINE HCL (PF) 1 % IJ SOLN
INTRAMUSCULAR | Status: DC | PRN
  Administered 2023-11-02: 5 mL

## 2023-11-02 MED ORDER — SODIUM CHLORIDE 0.9% FLUSH
3.0000 mL | Freq: Two times a day (BID) | INTRAVENOUS | Status: DC
  Administered 2023-11-02 – 2023-11-09 (×13): 3 mL via INTRAVENOUS

## 2023-11-02 MED ORDER — VERAPAMIL HCL 2.5 MG/ML IV SOLN
INTRAVENOUS | Status: AC
  Filled 2023-11-02: qty 2

## 2023-11-02 MED ORDER — SODIUM CHLORIDE 0.9 % IV SOLN: 250.0000 mL | INTRAVENOUS | Status: AC | PRN

## 2023-11-02 MED ORDER — HEPARIN (PORCINE) IN NACL 1000-0.9 UT/500ML-% IV SOLN
INTRAVENOUS | Status: DC | PRN
  Administered 2023-11-02: 1000 mL

## 2023-11-02 MED ORDER — FUROSEMIDE 10 MG/ML IJ SOLN
INTRAMUSCULAR | Status: AC
  Filled 2023-11-02: qty 4

## 2023-11-02 MED ORDER — SODIUM CHLORIDE 0.9% FLUSH: 3.0000 mL | INTRAVENOUS | Status: DC | PRN

## 2023-11-02 MED ORDER — LIDOCAINE HCL (PF) 1 % IJ SOLN
INTRAMUSCULAR | Status: AC
  Filled 2023-11-02: qty 30

## 2023-11-02 MED ORDER — ASPIRIN 81 MG PO CHEW: 81.0000 mg | CHEWABLE_TABLET | ORAL | Status: AC

## 2023-11-02 MED ORDER — HEPARIN SODIUM (PORCINE) 1000 UNIT/ML IJ SOLN
INTRAMUSCULAR | Status: DC | PRN
  Administered 2023-11-02: 5000 [IU] via INTRAVENOUS

## 2023-11-02 MED ORDER — MIDAZOLAM HCL 2 MG/2ML IJ SOLN
INTRAMUSCULAR | Status: AC
  Filled 2023-11-02: qty 2

## 2023-11-02 MED ORDER — SODIUM CHLORIDE 0.9 % IV SOLN
INTRAVENOUS | Status: DC
Start: 2023-11-02 — End: 2023-11-02

## 2023-11-02 MED ORDER — VERAPAMIL HCL 2.5 MG/ML IV SOLN
INTRAVENOUS | Status: DC | PRN
  Administered 2023-11-02: 10 mL via INTRA_ARTERIAL

## 2023-11-02 MED ORDER — IOHEXOL 350 MG/ML SOLN
INTRAVENOUS | Status: DC | PRN
  Administered 2023-11-02: 30 mL

## 2023-11-02 MED ORDER — LABETALOL HCL 5 MG/ML IV SOLN: 10.0000 mg | INTRAVENOUS | Status: AC | PRN

## 2023-11-02 MED ORDER — HYDRALAZINE HCL 20 MG/ML IJ SOLN: 10.0000 mg | INTRAMUSCULAR | Status: AC | PRN

## 2023-11-02 MED ORDER — FENTANYL CITRATE (PF) 100 MCG/2ML IJ SOLN
INTRAMUSCULAR | Status: AC
  Filled 2023-11-02: qty 2

## 2023-11-02 SURGICAL SUPPLY — 10 items
CATH BALLN WEDGE 5F 110CM (CATHETERS) IMPLANT
CATH INFINITI 5FR ANG PIGTAIL (CATHETERS) IMPLANT
CATH INFINITI AMBI 6FR TG (CATHETERS) IMPLANT
DEVICE RAD COMP TR BAND LRG (VASCULAR PRODUCTS) IMPLANT
GLIDESHEATH SLEND SS 6F .021 (SHEATH) IMPLANT
PACK CARDIAC CATHETERIZATION (CUSTOM PROCEDURE TRAY) ×1 IMPLANT
SET ATX-X65L (MISCELLANEOUS) IMPLANT
SHEATH GLIDE SLENDER 4/5FR (SHEATH) IMPLANT
SHEATH PROBE COVER 6X72 (BAG) IMPLANT
WIRE EMERALD 3MM-J .035X260CM (WIRE) IMPLANT

## 2023-11-02 NOTE — Evaluation (Signed)
 Occupational Therapy Evaluation Patient Details Name: Don [name] MRN: [id number] DOB: [birth year] Today's Date: 11/02/2023   History of Present Illness Don [name] is a 78 y.o. male admitted 10/28/23 for chest pain and fever. EKG with supraventricular tachycardia with rate 158, converted back to NSR following treatment of fever with Tylenol  and 1 L NS bolus. Limited echocardiogram was obtained which shows a preserved EF of 55 to 60% with no regional wall motion abnormalities. Of note, pt was recently admitted 1/3-1/9 for evaluation of syncope and this was felt to be due to orthostasis as he had been on Lasix  and 1/18-1/21 for dehydration in the setting of poor oral intake. PMH of chronic systolic CHF, CKD stage IIIa, HTN, HLD, cirrhosis, gout, and hepatitis C.   Clinical Impression   Pt admitted to [facility] from SNF where he was receiving skilled rehab following multiple recent hospital admissions. Pt reports he was receiving assistance from staff for grooming, dressing, bathing, toileting, and function mobility/transfers just prior to this admission. Pt now presents with decreased activity tolerance, generalized B UE weakness, decreased B UE fine and gross motor coordination (worse on Left), noted edematous B elbows (worse on Right), decreased R shoulder and elbow ROM, increased pain and decreased functional use of R UE, impaired cognition, and decreased safety and independence with functional tasks. Pt currently demonstrates ability to complete UB ADLs with Set up to Mod assist with frequent use of non-dominant L hand due to R UE deficits, LB ADLs with Max assist, and bed mobility during/in preparation for functional tasks with Min to Mod assist. VSS on RA throughout session. Pt participated well in session and reports he is motivated to increase independence and address R UE deficits. Pt will benefit from acute skilled OT services to address deficits and increase safety and independence with functional  tasks. Post acute discharge, pt will benefit from return to SNF for continued intensive inpatient skilled rehab services < 3 hours per day to maximize rehab potential.       If plan is discharge home, recommend the following: Two people to help with walking and/or transfers;A lot of help with bathing/dressing/bathroom;Assistance with cooking/housework;Assist for transportation;Help with stairs or ramp for entrance;Direct supervision/assist for medications management;Direct supervision/assist for financial management;Assistance with feeding (Set up for self-feeding)    Functional Status Assessment  Patient has had a recent decline in their functional status and demonstrates the ability to make significant improvements in function in a reasonable and predictable amount of time.  Equipment Recommendations  Other (comment) (defer to next level of care)    Recommendations for Other Services       Precautions / Restrictions Precautions Precautions: Fall Precaution Comments: watch BP Restrictions Weight Bearing Restrictions Per Provider Order: No      Mobility Bed Mobility Overal bed mobility: Needs Assistance Bed Mobility: Rolling, Supine to Sit, Sit to Supine Rolling: Min assist, Used rails (with cues for hand placement)   Supine to sit: Min assist, HOB elevated (with increased time) Sit to supine: Mod assist   General bed mobility comments: Min assist for trunk management and to bring hips to EOB in supine to sit; Mod assist for B LE elevation and trunk management in sit to supine    Transfers                          Balance Overall balance assessment: Needs assistance Sitting-balance support: Single extremity supported, No upper extremity supported, Feet supported Sitting  balance-Leahy Scale: Fair                                     ADL either performed or assessed with clinical judgement   ADL Overall ADL's : Needs  assistance/impaired Eating/Feeding: Set up;Sitting Eating/Feeding Details (indicate cue type and reason): Pt reporting intermittent use of non-dominant L UE for self-feeding over past few months due to pain in shoulder and elbow and edematous R elbow Grooming: Minimal assistance;Sitting   Upper Body Bathing: Minimal assistance;Moderate assistance;Sitting;Cueing for compensatory techniques (with increased time)   Lower Body Bathing: Maximal assistance;Sitting/lateral leans;Cueing for compensatory techniques (with increased time)   Upper Body Dressing : Minimal assistance;Sitting;Cueing for compensatory techniques (with increased time)   Lower Body Dressing: Maximal assistance;Sitting/lateral leans;Cueing for compensatory techniques (with increased time)       Toileting- Clothing Manipulation and Hygiene: Maximal assistance;Bed level;Cueing for compensatory techniques         General ADL Comments: Pt with decreased activity tolerance, edematous R UE, and pain in R UE with movement limiting functional level this session     Vision Baseline Vision/History: 0 No visual deficits Ability to See in Adequate Light: 0 Adequate Patient Visual Report: No change from baseline Vision Assessment?: No apparent visual deficits     Perception         Praxis         Pertinent Vitals/Pain Pain Assessment Pain Assessment: Faces Faces Pain Scale: Hurts little more Pain Location: R abdomen; R shoulder and R elbow with AROM/PROM Pain Descriptors / Indicators: Aching, Discomfort, Sore, Grimacing Pain Intervention(s): Limited activity within patient's tolerance, Monitored during session, Repositioned, Other (comment) (RN notified)     Extremity/Trunk Assessment Upper Extremity Assessment Upper Extremity Assessment: Right hand dominant;Generalized weakness;RUE deficits/detail;LUE deficits/detail RUE Deficits / Details: generalized weakness; decreased fine and gross motor coordination (worse on  Left); noted edematous R elbow with RN present and also visualizing; edematous R elbow restricting AROM/PROM; pain with AROM/PROM of shoulder and elbow (Pt reporting pain and swelling present for several months due to four falls at home prior to being at SNF, OT uncertain of accuracy of pt report; pt also with history of gout) RUE Coordination: decreased gross motor;decreased fine motor LUE Deficits / Details: generalized weakness; decreased fine and gross motor coordination (worse on Left); mildly edematous elbow LUE Coordination: decreased gross motor;decreased fine motor   Lower Extremity Assessment Lower Extremity Assessment: Defer to PT evaluation   Cervical / Trunk Assessment Cervical / Trunk Assessment: Normal   Communication Communication Communication: No apparent difficulties (mild difficulties with word finding; soft voice) Cueing Techniques: Verbal cues   Cognition Arousal: Alert Behavior During Therapy: Flat affect Overall Cognitive Status: Impaired/Different from baseline (No family/caregiver present to determine baseline cognition) Area of Impairment: Attention, Memory, Following commands, Awareness, Safety/judgement, Problem solving                   Current Attention Level: Selective Memory: Decreased short-term memory Following Commands: Follows one step commands consistently, Follows multi-step commands inconsistently, Follows multi-step commands with increased time Safety/Judgement: Decreased awareness of safety, Decreased awareness of deficits Awareness: Emergent Problem Solving: Slow processing, Difficulty sequencing, Requires verbal cues (Increased time for sequencing) General Comments: Pt oriented to self, place, and time. Pt reporting multiple recent admissions when asked about current reason for hospitalization. Pt pleasant throughout session with occasional need for increased explaination to be agreeable to task (ex.  pt initially declining repositioning  R UE in bed then agreeable once OT explained repositioning was to assist in management of swelling in R elbow).     General Comments  VSS on RA throughout session. RN present during a portion of session    Exercises     Shoulder Instructions      Home Living Family/patient expects to be discharged to:: Skilled nursing facility Living Arrangements: Other relatives Available Help at Discharge: Family;Available 24 hours/day Type of Home: House Home Access: Stairs to enter [employer] of Steps: 6 Entrance Stairs-Rails: Can reach both Home Layout: Two level;Able to live on main level with bedroom/bathroom Alternate Level Stairs-Number of Steps: 10 Alternate Level Stairs-Rails: Left Bathroom Shower/Tub: Tub/shower unit   Bathroom Toilet: Standard Bathroom Accessibility: Yes   Home Equipment: BSC/3in1;Rolling Walker (2 wheels);Crutches;Cane - single point   Additional Comments: currently presenting from SNF with plan to return to SNF; info above is for pt's home in [place]      Prior Functioning/Environment Prior Level of Function : Needs assist             Mobility Comments: Pt reports at the SNF he has relied on the staff to be able to get in/out of bed and transfer. Pt states he hasn't walked in months. Kinesiotape present on B ankles with pt reporting it was placed at the exercise place. Additional questioning clarified pt referring to rehab gym at [facility]. ADLs Comments: Pt reports Min assist with grooming and Mod to Max assist with bathing, dressing, and toileting in SNF. Pt reports staff at SNF is transferring him to an office chair for funcitonal mobility to bathroom. Suspect pt is decribing a transport wheelchair, but uncertain. Pt reports he self-feeds with Set up.        OT Problem List: Decreased strength;Decreased range of motion;Decreased activity tolerance;Impaired balance (sitting and/or standing);Decreased coordination;Decreased safety  awareness;Decreased knowledge of use of DME or AE;Impaired UE functional use;Increased edema      OT Treatment/Interventions: Self-care/ADL training;Therapeutic exercise;Energy conservation;DME and/or AE instruction;Therapeutic activities;Cognitive remediation/compensation;Patient/family education;Balance training    OT Goals(Current goals can be found in the care plan section) Acute Rehab OT Goals Patient Stated Goal: to be able to use his R arm better, to be able to walk OT Goal Formulation: With patient Time For Goal Achievement: 11/16/23 Potential to Achieve Goals: Good ADL Goals Pt Will Perform Grooming: with set-up;sitting Pt Will Perform Upper Body Bathing: with contact guard assist;sitting (with adaptive equipment as needed) Pt Will Perform Lower Body Bathing: with min assist;sitting/lateral leans;sit to/from stand;with adaptive equipment Pt Will Perform Lower Body Dressing: with min assist;sitting/lateral leans;sit to/from stand;with adaptive equipment Pt Will Transfer to Toilet: with min assist;bedside commode (step-pivot transfer with least restrictive AD) Pt Will Perform Toileting - Clothing Manipulation and hygiene: with min assist;sitting/lateral leans;sit to/from stand Pt/caregiver will Perform Home Exercise Program: Increased ROM;Increased strength;Both right and left upper extremity;With minimal assist;With written HEP provided (Increased activity tolerance)  OT Frequency: Min 1X/week    Co-evaluation              AM-PAC OT 6 Clicks Daily Activity     Outcome Measure Help from another person eating meals?: A Little Help from another person taking care of personal grooming?: A Little Help from another person toileting, which includes using toliet, bedpan, or urinal?: A Lot Help from another person bathing (including washing, rinsing, drying)?: A Lot Help from another person to put on and taking off regular upper body clothing?: A  Little Help from another person to  put on and taking off regular lower body clothing?: A Lot 6 Click Score: 15   End of Session Nurse Communication: Mobility status;Other (comment) (Pt with noted edematous B elbows, much worse on R with ROM affected in R elbow with RN visualizing during session.)  Activity Tolerance: Patient tolerated treatment well Patient left: in bed;with call bell/phone within reach;with bed alarm set  OT Visit Diagnosis: Muscle weakness (generalized) (M62.81);Ataxia, unspecified (R27.0);Other (comment) (decreased activity tolerance)                Time: 9076-9055 OT Time Calculation (min): 21 min Charges:  OT General Charges $OT Visit: 1 Visit OT Evaluation $OT Eval Moderate Complexity: 1 Mod  Margarie Rockey HERO., OTR/L, MA Acute Rehab [phone]   Margarie FORBES Horns 11/02/2023, 10:23 AM

## 2023-11-02 NOTE — TOC Progression Note (Addendum)
 Transition of Care [facility]) - Progression Note    Patient Details  Name: Don [name] MRN: [id number] Date of Birth: 09/16/1946  Transition of Care [facility]) CM/SW Contact  Isaiah Public, LCSWA Phone Number: 11/02/2023, 11:11 AM  Clinical Narrative:     Patients insurance authorization currently pending for SNF. CSW submitted OT note that was requested to patients insurance for review. CSW will continue to follow and assist with patients dc planning needs.  Expected Discharge Plan: Skilled Nursing Facility Barriers to Discharge: Continued Medical Work up  Expected Discharge Plan and Services In-house Referral: Clinical Social Work     Living arrangements for the past 2 months: Skilled Nursing Facility                                       Social Determinants of Health (SDOH) Interventions SDOH Screenings   Food Insecurity: Patient Unable To Answer (10/30/2023)  Housing: Low Risk  (10/14/2023)  Transportation Needs: No Transportation Needs (10/14/2023)  Utilities: Not At Risk (10/14/2023)  Social Connections: Unknown (10/31/2023)  Tobacco Use: Medium Risk (10/28/2023)    Readmission Risk Interventions    10/29/2023   10:25 AM 10/14/2023   11:02 AM  Readmission Risk Prevention Plan  Transportation Screening Complete Complete  HRI or Home Care Consult  Complete  Social Work Consult for Recovery Care Planning/Counseling  Complete  Palliative Care Screening  Not Applicable  Medication Review Oceanographer) Complete Complete  HRI or Home Care Consult Complete   SW Recovery Care/Counseling Consult Complete   Palliative Care Screening Not Applicable   Skilled Nursing Facility Complete

## 2023-11-02 NOTE — Progress Notes (Signed)
 Physical Therapy Treatment Patient Details Name: Don [name] MRN: [id number] DOB: [birth year] Today's Date: 11/02/2023   History of Present Illness Don [name] is a 78 y.o. male admitted 10/28/23 for chest pain and fever. EKG with supraventricular tachycardia with rate 158, converted back to NSR following treatment of fever with Tylenol  and 1 L NS bolus. Limited echocardiogram was obtained which shows a preserved EF of 55 to 60% with no regional wall motion abnormalities. Of note, pt was recently admitted 1/3-1/9 for evaluation of syncope and this was felt to be due to orthostasis as he had been on Lasix  and 1/18-1/21 for dehydration in the setting of poor oral intake. PMH of chronic systolic CHF, CKD stage IIIa, HTN, HLD, cirrhosis, gout, and hepatitis C.    PT Comments  PT session limited secondary to arrival of transport to take pt for heart cath. Pt required less assistance with bed mobility performing supine<>sit with minA. He was able to perform STS x2 from an elevated surface with modA using RW with a seated rest in between attempts. Pt maintains excessive fwd flex when standing, so his trunk is over the RW. VC/TC to correct posture were unsuccessful and pt can only tolerate standing for a short period of time. Attempted to side step along EOB, pt took 2 steps to the L before reporting fatigue and inability to adjust RW. Will continue to follow acutely and advance appropriately.      If plan is discharge home, recommend the following: A lot of help with bathing/dressing/bathroom;Help with stairs or ramp for entrance;Assistance with cooking/housework;Assist for transportation;A lot of help with walking and/or transfers   Can travel by private vehicle     No  Equipment Recommendations  Wheelchair (measurements PT);Wheelchair cushion (measurements PT)    Recommendations for Other Services       Precautions / Restrictions Precautions Precautions: Fall Precaution Comments: watch HR and  BP Restrictions Weight Bearing Restrictions Per Provider Order: No     Mobility  Bed Mobility Overal bed mobility: Needs Assistance Bed Mobility: Supine to Sit, Sit to Supine     Supine to sit: HOB elevated, Used rails, Min assist Sit to supine: Min assist   General bed mobility comments: Pt took increase time to perform bed mobility. Sat up on the L side of bed, brought LE off EOB, required minA at trunk to obtain upright position. Cued pt to scoot EOB, he would lean to off weight hip and slightly shift hips fwd. Took increased time to reach EOB, provided minA via bed pad to bring hips to EOB quicker. Returning to supine pt require minA for BLE elevation back into bed. Repositioned using bed features and bed pad with modA x2 to scoot HOB.    Transfers Overall transfer level: Needs assistance Equipment used: Rolling walker (2 wheels) Transfers: Sit to/from Stand Sit to Stand: Mod assist, From elevated surface                Ambulation/Gait Ambulation/Gait assistance: Mod assist, +2 safety/equipment Gait Distance (Feet): 1 Feet Assistive device: Rolling walker (2 wheels)         General Gait Details: Pt was able to take two side steps to the L, but was unable to continue d/t fatigue and BLE weakness. Pt was unable to manuever RW to continue stepping. Unable to further OOB mobility secondary to arrival of transport to take pt for heart cath.   Stairs  Wheelchair Mobility     Tilt Bed    Modified Rankin (Stroke Patients Only)       Balance Overall balance assessment: Needs assistance Sitting-balance support: Single extremity supported, Feet supported Sitting balance-Leahy Scale: Fair Sitting balance - Comments: Pt sat EOB with supervision.   Standing balance support: Bilateral upper extremity supported, During functional activity, Reliant on assistive device for balance Standing balance-Leahy Scale: Poor Standing balance comment: Dependent on  RW for support when OOB. Pt maintained excessive fwd lean while standing, unable to obtain neutral posture.                            Cognition Arousal: Alert Behavior During Therapy: Flat affect Overall Cognitive Status: No family/caregiver present to determine baseline cognitive functioning                                 General Comments: Pt follows simple one step commands with increased time. He demonstrated difficulty with sequencing and was slow to initate tasks.        Exercises      General Comments General comments (skin integrity, edema, etc.): Pt had one episode of VTach during session.      Pertinent Vitals/Pain Pain Assessment Pain Assessment: Faces Faces Pain Scale: Hurts little more Pain Location: R Abdomen Pain Descriptors / Indicators: Discomfort, Grimacing Pain Intervention(s): Monitored during session    Home Living                          Prior Function            PT Goals (current goals can now be found in the care plan section) Acute Rehab PT Goals Patient Stated Goal: Try standing Progress towards PT goals: Progressing toward goals    Frequency    Min 1X/week      PT Plan      Co-evaluation              AM-PAC PT 6 Clicks Mobility   Outcome Measure  Help needed turning from your back to your side while in a flat bed without using bedrails?: A Lot Help needed moving from lying on your back to sitting on the side of a flat bed without using bedrails?: A Lot Help needed moving to and from a bed to a chair (including a wheelchair)?: A Lot Help needed standing up from a chair using your arms (e.g., wheelchair or bedside chair)?: A Lot Help needed to walk in hospital room?: Total Help needed climbing 3-5 steps with a railing? : Total 6 Click Score: 10    End of Session Equipment Utilized During Treatment: Gait belt Activity Tolerance: Other (comment) (Treatment limited secondary to arrival  of transport to take pt off floor for heart cath.) Patient left: in bed;with call bell/phone within reach Nurse Communication: Mobility status PT Visit Diagnosis: Unsteadiness on feet (R26.81);Other abnormalities of gait and mobility (R26.89);Muscle weakness (generalized) (M62.81);Pain Pain - Right/Left: Right Pain - part of body:  (Abdomen)     Time: 8572-8556 PT Time Calculation (min) (ACUTE ONLY): 16 min  Charges:    $Therapeutic Activity: 8-22 mins                       Randall SAUNDERS, PT, DPT Acute Rehabilitation Services Office: [phone] Secure Chat Preferred    Delon  CHRISTELLA Callander 11/02/2023, 3:56 PM

## 2023-11-02 NOTE — Progress Notes (Signed)
 PROGRESS NOTE  Don [name] FMW:[id number] DOB: 10/10/1945   PCP: [facility]  Patient is from: SNF.  Nonambulatory at baseline.  DOA: 10/28/2023 LOS: 5  Chief complaints Chief Complaint  Patient presents with   Chest Pain     Brief Narrative / Interim history: 78 year old M with PMH of HFimpEF, liver cirrhosis, hepatitis C, hypotension on midodrine , anemia, HTN, HLD and gout brought to [facility] by EMS from [facility] on 2/2 due to acute onset substernal chest pain and fever.    In the ED, temp 101.8 F, HR 160, RR 20, BP 175/105, SpO2 95% on room air.  WBC 9.7, hemoglobin 8.3, platelet count 381.  CMP without significant finding.  Trop 77>597.  EKG notable for supraventricular tachycardia, rate 158. Lactic acid 1.2.  COVID/influenza/RSV PCR negative.  UA and CXR without significant finding. Blood cultures x 2 obtained.  Started on broad-spectrum antibiotics.  Cardiology consulted.  Admission requested for acute myocarditis.  Patient was evaluated by cardiology who recommended transfer to [facility] for further cardiac evaluation  SVT and fever resolved.  RVP and blood culture negative.  Antibiotics discontinued.  Elevated inflammatory markers, intermittent fever and acute on chronic anemia without explanation.   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  Other differential diagnosis including sarcoidosis, and a small pericardial effusion.  Plan for [facility]  Patient continued to spike intermittent fever with markedly elevated inflammatory markers.  No clear source of infection.  ID consulted.   Subjective: Seen and examined earlier this morning.  Spiked another fever to 101.3 last night again.  No clear source of infection.  Patient has no complaints but not a great historian.  He responds no to pain although he later reported right arm/elbow pain.   Objective: Vitals:   11/02/23 0742 11/02/23 0831 11/02/23 0926 11/02/23 1246  BP:  129/63 132/72 (!)  146/90  Pulse:  74 81 83  Resp:  20  20  Temp:  98.6 F (37 C)  99.6 F (37.6 C)  TempSrc:  Oral  Oral  SpO2: 99% 99%  100%  Weight:      Height:        Examination:  GENERAL: No apparent distress.  Appears frail. HEENT: MMM.  Vision and hearing grossly intact.  NECK: Supple.  No apparent JVD.  RESP:  No IWOB.  Fair aeration bilaterally. CVS:  RRR. Heart sounds normal.  ABD/GI/GU: BS+. Abd soft, NTND.  MSK/EXT:  Moves extremities.  Some swelling in right forearm but no erythema or increased warmth to touch.  Fair range of motion in right elbow. SKIN: no apparent skin lesion or wound NEURO: Awake, alert and oriented appropriately.  No apparent focal neuro deficit. PSYCH: Calm.  Flat affect.  Procedures:  None  Microbiology summarized: COVID-19, influenza and RSV PCR nonreactive A 20 pathogen RVP nonreactive Blood cultures negative  Assessment and plan: Acute myocarditis/suspected non-STEMI: Presented with acute substernal chest pain and fever.  Troponin 77>>> 1600>> 336 suggesting ACS.  Limited TTE without significant finding.  Infectious workup unrevealing.  Pro-Cal negative.  CRP elevated to 15>> 18.  ESR elevated to 90>> 104.  ANA normal.  Cardiac MRI concerning for acute myocarditis. -Cardiology board-plan for [facility] and steroid based on the outcome of heart cath -Follow SPEP. -Continue low-dose aspirin    Supraventricular tachycardia: HR elevated to 160s on arrival.  History of BB intolerance.  Resolved.  -Continue Coreg .  Tolerating. -Optimize electrolytes. -Continue telemetry   Intermittent fever: Unclear source.  Infectious workup including CXR, UA, cultures and viral panel unrevealing.  Antibiotics discontinued. Inflammatory markers markedly elevated.  MRI suggestive of acute myocarditis.  Left foot and right elbow x-ray without significant finding. -Consulted ID  Chronic HFimpEF: Limited TTE with LVEF of 55 to 60% (previously 20 to 25%)., no RWMA.  BNP elevated  to 1600.  Appears euvolemic on exam.  Currently without cardiopulmonary symptoms.  Not on diuretics.  Cardiac MRI as above. -Continue Coreg  as above -Did not tolerate GDMT in the past due to orthostatic hypotension. -Strict intake and output, daily weight and renal functions  History of gout without acute flareup: While he is unusually high dose colchicine .  He is also on a statin which could increase his risk of rhabdo.  CK and uric acid not elevated. -Decreased colchicine  to 0.6 mg daily as needed. -Continue home allopurinol .  Acute on chronic anemia: Although Hgb dropped about 2 g in the last 24 hours, baseline about 8.0.  No overt bleeding.  LDH within normal.  Blood smear without significant finding.  Inflammatory markers markedly elevated. Recent Labs    10/15/23 0520 10/28/23 1933 10/29/23 0516 10/30/23 0304 10/31/23 0433 10/31/23 0810 11/01/23 0439 11/01/23 1000 11/01/23 1415 11/02/23 0725  HGB 7.9* 8.3* 9.2* 9.2* 8.7* 9.6* 7.7* 7.6* 8.1* 7.6*  -Follow Hemoccult -Follow haptoglobin. -SCD for VTE prophylaxis  Significantly elevated CRP and ESR with intermittent fever: Due to acute myocarditis?  No clear source of infection. -ID consulted.  History of liver cirrhosis: Compensated.  Euthyroid sick syndrome: Mild elevated TSH with normal free T4. -Repeat in 4 to 6 weeks  CKD 3A ruled out.  Hyperlipidemia -Continue Crestor  5 mg p.o. daily  History of hepatitis C Outpatient follow-up with gastroenterology/infectious disease.  Bilateral leg pain: Exam reassuring except for left foot tenderness.  X-ray negative.  No signs of infection. -Tylenol  as needed -Voltaren  gel  Ambulatory dysfunction: Patient reports using chairs with wheels to get around.  -PT/OT eval   GERD -Protonix  40 mg p.o. daily  Physical deconditioning: Too deconditioned and frail.  Poor overall prognosis.  Still full code. -Consulted palliative   Body mass index is 22.34 kg/m.            DVT prophylaxis:  Place and maintain sequential compression device Start: 11/01/23 1234  Code Status: Full code Family Communication: Updated patient's sister over the phone on 2/5.  None at bedside today. Level of care: Progressive Status is: Inpatient Remains inpatient appropriate because: Non-STEMI/elevated troponin/myocarditis   Final disposition: Likely back to SNF Consultants:  Cardiology Infectious disease Palliative medicine  55 minutes with more than 50% spent in reviewing records, counseling patient/family and coordinating care.   Sch Meds:  Scheduled Meds:  allopurinol   150 mg Oral Daily   ascorbic acid   500 mg Oral Q breakfast   aspirin  EC  81 mg Oral Daily   calcitRIOL   0.25 mcg Oral Daily   carvedilol   6.25 mg Oral BID WC   cholecalciferol   1,000 Units Oral Daily   cyanocobalamin   1,000 mcg Intramuscular Once per day on Monday Friday   diclofenac  Sodium  2 g Topical QID   feeding supplement  237 mL Oral BID BM   ferrous sulfate   325 mg Oral Q breakfast   multivitamin with minerals  1 tablet Oral Daily   pantoprazole   40 mg Oral Daily   rosuvastatin   5 mg Oral Daily   spironolactone   12.5 mg Oral Daily   umeclidinium bromide   1 puff Inhalation Daily  Continuous Infusions:  sodium chloride      PRN Meds:.acetaminophen  **OR** acetaminophen , albuterol , colchicine , hydrALAZINE , magnesium  hydroxide, morphine  injection, nitroGLYCERIN , ondansetron  **OR** ondansetron  (ZOFRAN ) IV, traZODone   Antimicrobials: Anti-infectives (From admission, onward)    Start     Dose/Rate Route Frequency Ordered Stop   10/28/23 1930  vancomycin  (VANCOCIN ) IVPB 1000 mg/200 mL premix        1,000 mg 200 mL/hr over 60 Minutes Intravenous  Once 10/28/23 1922 10/28/23 2126   10/28/23 1930  ceFEPIme  (MAXIPIME ) 2 g in sodium chloride  0.9 % 100 mL IVPB        2 g 200 mL/hr over 30 Minutes Intravenous  Once 10/28/23 1922 10/28/23 2013        I have personally reviewed the following  labs and images: CBC: Recent Labs  Lab 10/28/23 1933 10/29/23 0516 10/31/23 0433 10/31/23 0810 11/01/23 0439 11/01/23 1000 11/01/23 1415 11/02/23 0725  WBC 9.7   < > 8.6 9.0 8.5  --  9.0 9.4  NEUTROABS 7.6  --   --   --   --   --  7.1  --   HGB 8.3*   < > 8.7* 9.6* 7.7* 7.6* 8.1* 7.6*  HCT 25.8*   < > 27.1* 28.8* 23.1* 23.5* 25.2* 23.6*  MCV 94.9   < > 91.9 91.1 90.2  --  93.7 92.2  PLT 381   < > 385 429* 336  --  375 353   < > = values in this interval not displayed.   BMP &GFR Recent Labs  Lab 10/30/23 0304 10/31/23 0433 10/31/23 0810 11/01/23 0439 11/02/23 0725  NA 137 137 137 140 137  K 3.9 3.6 3.4* 3.8 3.6  CL 102 102 102 107 103  CO2 22 25 25 24 24   GLUCOSE 87 93 100* 129* 94  BUN 9 6* 7* 13 10  CREATININE 0.79 0.88 0.90 0.98 0.88  CALCIUM  9.0 8.9 8.9 8.9 8.8*  MG 1.5* 1.9 1.8 1.8  --   PHOS  --   --  3.6  --   --    Estimated Creatinine Clearance: 74.3 mL/min (by C-G formula based on SCr of 0.88 mg/dL). Liver & Pancreas: Recent Labs  Lab 10/28/23 1933 10/30/23 0304 10/31/23 0810 11/01/23 0439  AST 33 27  --  26  ALT 24 21  --  20  ALKPHOS 122 126  --  121  BILITOT 0.7 1.1  --  0.6  PROT 6.1* 6.4*  --  5.2*  ALBUMIN  2.3* 2.3* 2.0* 1.7*   No results for input(s): LIPASE, AMYLASE in the last 168 hours. No results for input(s): AMMONIA in the last 168 hours. Diabetic: No results for input(s): HGBA1C in the last 72 hours. No results for input(s): GLUCAP in the last 168 hours. Cardiac Enzymes: Recent Labs  Lab 10/31/23 0809 11/01/23 0439  CKTOTAL 33* 23*   No results for input(s): PROBNP in the last 8760 hours. Coagulation Profile: Recent Labs  Lab 10/28/23 1933  INR 1.3*   Thyroid  Function Tests: No results for input(s): TSH, T4TOTAL, FREET4, T3FREE, THYROIDAB in the last 72 hours.  Lipid Profile: No results for input(s): CHOL, HDL, LDLCALC, TRIG, CHOLHDL, LDLDIRECT in the last 72 hours.  Anemia  Panel: No results for input(s): VITAMINB12, FOLATE, FERRITIN, TIBC, IRON, RETICCTPCT in the last 72 hours.  Urine analysis:    Component Value Date/Time   COLORURINE AMBER (A) 10/28/2023 1922   APPEARANCEUR CLEAR 10/28/2023 1922   LABSPEC 1.018 10/28/2023 1922  PHURINE 6.0 10/28/2023 1922   GLUCOSEU NEGATIVE 10/28/2023 1922   HGBUR NEGATIVE 10/28/2023 1922   BILIRUBINUR NEGATIVE 10/28/2023 1922   KETONESUR NEGATIVE 10/28/2023 1922   PROTEINUR NEGATIVE 10/28/2023 1922   UROBILINOGEN 1.0 02/01/2012 1300   NITRITE NEGATIVE 10/28/2023 1922   LEUKOCYTESUR NEGATIVE 10/28/2023 1922   Sepsis Labs: Invalid input(s): PROCALCITONIN, LACTICIDVEN  Microbiology: Recent Results (from the past 240 hours)  Resp panel by RT-PCR (RSV, Flu A&B, Covid) Anterior Nasal Swab     Status: None   Collection Time: 10/28/23  7:22 PM   Specimen: Anterior Nasal Swab  Result Value Ref Range Status   SARS Coronavirus 2 by RT PCR NEGATIVE NEGATIVE Final    Comment: (NOTE) SARS-CoV-2 target nucleic acids are NOT DETECTED.  The SARS-CoV-2 RNA is generally detectable in upper respiratory specimens during the acute phase of infection. The lowest concentration of SARS-CoV-2 viral copies this assay can detect is 138 copies/mL. A negative result does not preclude SARS-Cov-2 infection and should not be used as the sole basis for treatment or other patient management decisions. A negative result may occur with  improper specimen collection/handling, submission of specimen other than nasopharyngeal swab, presence of viral mutation(s) within the areas targeted by this assay, and inadequate number of viral copies(<138 copies/mL). A negative result must be combined with clinical observations, patient history, and epidemiological information. The expected result is Negative.  Fact Sheet for Patients:  bloggercourse.com  Fact Sheet for Healthcare Providers:   seriousbroker.it  This test is no t yet approved or cleared by the [place]  FDA and  has been authorized for detection and/or diagnosis of SARS-CoV-2 by FDA under an Emergency Use Authorization (EUA). This EUA will remain  in effect (meaning this test can be used) for the duration of the COVID-19 declaration under Section 564(b)(1) of the Act, 21 U.S.C.section 360bbb-3(b)(1), unless the authorization is terminated  or revoked sooner.       Influenza A by PCR NEGATIVE NEGATIVE Final   Influenza B by PCR NEGATIVE NEGATIVE Final    Comment: (NOTE) The Xpert Xpress SARS-CoV-2/FLU/RSV plus assay is intended as an aid in the diagnosis of influenza from Nasopharyngeal swab specimens and should not be used as a sole basis for treatment. Nasal washings and aspirates are unacceptable for Xpert Xpress SARS-CoV-2/FLU/RSV testing.  Fact Sheet for Patients: bloggercourse.com  Fact Sheet for Healthcare Providers: seriousbroker.it  This test is not yet approved or cleared by the [place]  FDA and has been authorized for detection and/or diagnosis of SARS-CoV-2 by FDA under an Emergency Use Authorization (EUA). This EUA will remain in effect (meaning this test can be used) for the duration of the COVID-19 declaration under Section 564(b)(1) of the Act, 21 U.S.C. section 360bbb-3(b)(1), unless the authorization is terminated or revoked.     Resp Syncytial Virus by PCR NEGATIVE NEGATIVE Final    Comment: (NOTE) Fact Sheet for Patients: bloggercourse.com  Fact Sheet for Healthcare Providers: seriousbroker.it  This test is not yet approved or cleared by the [place]  FDA and has been authorized for detection and/or diagnosis of SARS-CoV-2 by FDA under an Emergency Use Authorization (EUA). This EUA will remain in effect (meaning this test can be used) for  the duration of the COVID-19 declaration under Section 564(b)(1) of the Act, 21 U.S.C. section 360bbb-3(b)(1), unless the authorization is terminated or revoked.  Performed at [facility], [address], [place], [place] [address]   Blood Culture (routine x 2)     Status: None  Collection Time: 10/28/23  7:33 PM   Specimen: Left Antecubital; Blood  Result Value Ref Range Status   Specimen Description LEFT ANTECUBITAL BLOOD  Final   Special Requests   Final    BOTTLES DRAWN AEROBIC AND ANAEROBIC Blood Culture adequate volume   Culture   Final    NO GROWTH 5 DAYS Performed at [facility], [address] [place], [place] [address]    Report Status 11/02/2023 FINAL  Final  Blood Culture (routine x 2)     Status: None   Collection Time: 10/28/23  7:43 PM   Specimen: BLOOD RIGHT FOREARM  Result Value Ref Range Status   Specimen Description BLOOD RIGHT FOREARM BLOOD  Final   Special Requests   Final    BOTTLES DRAWN AEROBIC AND ANAEROBIC Blood Culture adequate volume   Culture   Final    NO GROWTH 5 DAYS Performed at [facility], [address] [place], [place] [address]    Report Status 11/02/2023 FINAL  Final  Respiratory (~20 pathogens) panel by PCR     Status: None   Collection Time: 10/29/23  5:20 PM   Specimen: Nasopharyngeal Swab; Respiratory  Result Value Ref Range Status   Adenovirus NOT DETECTED NOT DETECTED Final   Coronavirus 229E NOT DETECTED NOT DETECTED Final    Comment: (NOTE) The Coronavirus on the Respiratory Panel, DOES NOT test for the novel  Coronavirus (2019 nCoV)    Coronavirus HKU1 NOT DETECTED NOT DETECTED Final   Coronavirus NL63 NOT DETECTED NOT DETECTED Final   Coronavirus OC43 NOT DETECTED NOT DETECTED Final   Metapneumovirus NOT DETECTED NOT DETECTED Final   Rhinovirus / Enterovirus NOT DETECTED NOT DETECTED Final   Influenza A NOT DETECTED NOT DETECTED Final   Influenza B NOT DETECTED NOT DETECTED Final   Parainfluenza Virus 1 NOT DETECTED  NOT DETECTED Final   Parainfluenza Virus 2 NOT DETECTED NOT DETECTED Final   Parainfluenza Virus 3 NOT DETECTED NOT DETECTED Final   Parainfluenza Virus 4 NOT DETECTED NOT DETECTED Final   Respiratory Syncytial Virus NOT DETECTED NOT DETECTED Final   Bordetella pertussis NOT DETECTED NOT DETECTED Final   Bordetella Parapertussis NOT DETECTED NOT DETECTED Final   Chlamydophila pneumoniae NOT DETECTED NOT DETECTED Final   Mycoplasma pneumoniae NOT DETECTED NOT DETECTED Final    Comment: Performed at [facility], [address], [place], [place] [address]    Radiology Studies: DG Elbow 2 Views Right Result Date: 11/02/2023 CLINICAL DATA:  Right elbow pain. EXAM: RIGHT ELBOW - 2 VIEW COMPARISON:  None Available. FINDINGS: An IV catheter is seen extending into the volar soft tissues at the distal aspect of the upper arm proximal to the elbow. No joint effusion. Minimal chronic enthesopathic change at the triceps insertion on the olecranon. Mild medial and lateral elbow chondrocalcinosis. No significant joint space narrowing. No acute fracture or dislocation. Moderate atherosclerotic calcifications. IMPRESSION: Mild medial and lateral elbow chondrocalcinosis. No significant joint space narrowing. Electronically Signed   By: Tanda Lyons M.D.   On: 11/02/2023 13:53   DG Foot 2 Views Left Result Date: 11/01/2023 CLINICAL DATA:  Left foot pain.  No known injury. EXAM: LEFT FOOT - 2 VIEW COMPARISON:  10/01/2023. FINDINGS: There is no evidence of acute fracture or dislocation. Degenerative changes are present in the midfoot. Moderate calcaneal spurring is present. Vascular calcifications are present in the soft tissues. IMPRESSION: No acute osseous abnormality. Electronically Signed   By: Leita Birmingham M.D.   On:  11/01/2023 19:33      Huong Luthi T. Vijay Durflinger [facility]  If 7PM-7AM, please contact night-coverage [email or web address] 11/02/2023, 2:22 PM

## 2023-11-02 NOTE — Progress Notes (Signed)
 ID Brief note   78 year old male with multiple comorbidities including CHF, liver cirrhosis, hepatitis C, HTN, HLD, gout admitted with possible myocarditis/non-STEMI.  ID consulted for fevers  Chart reviewed No leukocytosis, hemodynamically stable and off antibiotics currently 2/2 blood cx NGTD RVP negative   Patient was off to the floor for cardiac cath when I went to see the patient.  Dr Efrain on this weekend. Full consult note to follow tomorrow   D/w Dr Kathrin  Annalee Orem, MD Infectious Disease Physician [facility] [address] [place], [place] [address] Phone: [phone]  Fax: [phone]

## 2023-11-02 NOTE — Interval H&P Note (Signed)
 History and Physical Interval Note:  11/02/2023 7:05 AM  Don [name]  has presented today for surgery, with the diagnosis of nstemi.  The various methods of treatment have been discussed with the patient and family. After consideration of risks, benefits and other options for treatment, the patient has consented to  Procedure(s): RIGHT/LEFT HEART CATH AND CORONARY ANGIOGRAPHY (N/A) as a surgical intervention.  The patient's history has been reviewed, patient examined, no change in status, stable for surgery.  I have reviewed the patient's chart and labs.  Questions were answered to the patient's satisfaction.     Doretta Remmert K Alethea Terhaar

## 2023-11-02 NOTE — Progress Notes (Addendum)
 Patient Name: Don [name] Date of Encounter: 11/02/2023  [facility] Cardiologist: Jayson Sierras, MD   Interval Summary  .    Not oriented to year, president.  Hemoglobin again dropped today 7.6.  No obvious signs of bleeding.  Denies any chest pain, has some slight shortness of breath.  Very weak and lethargic.  Vital Signs .    Vitals:   11/01/23 2020 11/01/23 2341 11/02/23 0300 11/02/23 0742  BP: 124/74  132/69   Pulse: 96  81   Resp: 20  16   Temp: 100 F (37.8 C) (!) 101.3 F (38.5 C) 98.9 F (37.2 C)   TempSrc: Oral Oral Oral   SpO2: 98%  98% 99%  Weight:      Height:        Intake/Output Summary (Last 24 hours) at 11/02/2023 0801 Last data filed at 11/02/2023 0304 Gross per 24 hour  Intake 81.33 ml  Output 600 ml  Net -518.67 ml      10/30/2023    5:25 PM 10/28/2023    6:56 PM 10/14/2023    6:14 AM  Last 3 Weights  Weight (lbs) 164 lb 10.9 oz 165 lb 5.5 oz 164 lb 14.5 oz  Weight (kg) 74.7 kg 75 kg 74.8 kg      Telemetry/ECG    Sinus rhythm heart rates in the 70s, PVCs- Personally Reviewed  CV Studies    Cardiac MRI 11/01/2023 . Diffusely elevated myocardial T2 values (along with T1 and ECV), suggesting myocardial edema and meeting criteria for acute myocarditis. In addition, there is dense LGE in basal septum and inferior walls, along with RV insertion sites, which is a nonischemic scar pattern. Given multifocal LGE, while could be due to myocarditis, would also consider sarcoidosis on the differential and recommend chest CT to evaluate for pulmonary sarcoid and FDG-PET to evaluate for cardiac sarcoid. Finally, global ECV is markedly elevated (43%), which is in the amyloid range; suspect ECV elevation is due to diffuse myocardial edema in setting of myocarditis rather than amyloid, and would recommend repeating cardiac MRI in 3 months to reevaluate ECV once inflammation has resolved   2. Asymmetric LV hypertrophy measuring 14mm in septum  (9mm in posterior wall), not meeting criteria for hypertrophic cardiomyopathy (<23mm)   3.  Mild LV dilatation with moderate systolic dysfunction (EF 37%)   4.  Normal RV size with moderate systolic dysfunction (EF 39%)   5.  Small pericardial effusion   6.  Dilated main pulmonary artery measuring 31mm   Complete Echocardiogram 09/29/2023: Impressions: 1. Left ventricular ejection fraction, by estimation, is 55 to 60%. The  left ventricle has normal function. The left ventricle has no regional  wall motion abnormalities. There is mild left ventricular hypertrophy.  Left ventricular diastolic parameters  are consistent with Grade I diastolic dysfunction (impaired relaxation).   2. Right ventricular systolic function is normal. The right ventricular  size is normal. There is normal pulmonary artery systolic pressure.   3. The mitral valve is normal in structure. No evidence of mitral valve  regurgitation. No evidence of mitral stenosis.   4. The tricuspid valve is abnormal.   5. The aortic valve is tricuspid. There is mild calcification of the  aortic valve. There is mild thickening of the aortic valve. Aortic valve  regurgitation is mild. No aortic stenosis is present.   6. Aortic dilatation noted. There is mild dilatation of the ascending  aorta, measuring 38 mm.   7. The inferior vena  cava is normal in size with greater than 50%  respiratory variability, suggesting right atrial pressure of 3 mmHg.  _______________   Limited Echocardiogram 10/29/2023: Impressions: 1. No doppler analysis done.   2. Left ventricular ejection fraction, by estimation, is 55 to 60%. The  left ventricle has normal function. The left ventricle has no regional  wall motion abnormalities. There is mild concentric left ventricular  hypertrophy.   3. Right ventricular systolic function is normal. The right ventricular  size is normal.   4. Left atrial size was moderately dilated.   5. The mitral valve is  grossly normal.   6. The aortic valve is tricuspid. No aortic stenosis is present.   7. The inferior vena cava is normal in size with greater than 50%  respiratory variability, suggesting right atrial pressure of 3 mmHg.   Physical Exam .   GEN: Ill and frail appearing Neck: + JVD Cardiac: RRR, no murmurs, rubs, or gallops.  Respiratory: Clear to auscultation bilaterally. GI: Soft, nontender, non-distended  MS: No edema.  Significant right upper extremity weakness, asymmetric swelling  Patient Profile    78 y.o. male with a history of chronic HFrEF with EF as low as 20-25% in 03/2019 with subsequent normalization, hypertension, hyperlipidemia, CKD stage III, hepatitis C, liver cirrhosis, GERD, and gout. He was recently admitted in 09/2023 for dehydration in setting of poor PO intake and adult failure to thrive. Hospitalization was complicated by persistent hypotension despite IV fluids and holding of antihypertensives and he was discharged on Midodrine .   He was readmitted on 10/28/2023 for NSTEMI and possible acute myocarditis after presenting to [facility] with acute onset substernal chest and upper abdominal pain as well as fever of 100.1.    Assessment & Plan .     NSTEMI Acute myocarditis Atypical chest pain (non now), febrile, initial troponins rising to 1000+, lateral ST depressions.  Elevated sed rate and CRP 19.  Cardiac MRI yesterday shows elevated myocardial T2 values suggestive of acute myocarditis with multifocal LGE in a nonischemic disease pattern.  ECV 43% which could suggest possible amyloid or findings consistent with myocarditis.  Sarcoidosis also needs to be considered.  LVEF 37%, moderately reduced RV function 39%.  Although surface echocardiogram showed normal EF.   Per AHF recommendations  will plan for right left heart catheterization.  If cardiac output is reduced less than 2.0 recommended 1 g of Solu-Medrol  x 3 days with slow 4-week prednisone  taper.  Not felt  to be candidate for advanced therapies.  If low output likely will need palliative care.  He is disheveled, frail, weak.  I do not feel he is of capable medical decision-making therefore consent was placed through daughter, other APP present during consent.  Continue aspirin , beta-blocker, statin (LDL 40) Repeat cardiac MRI in 3 months May consider chest CT for sarcoid tomorrow if renal functions okay.  Informed Consent   Shared Decision Making/Informed Consent The risks [stroke (1 in 1000), death (1 in 1000), kidney failure [usually temporary] (1 in 500), bleeding (1 in 200), allergic reaction [possibly serious] (1 in 200)], benefits (diagnostic support and management of coronary artery disease) and alternatives of a cardiac catheterization were discussed in detail with Mr. Eliot and he is willing to proceed.      Heart failure with recovered EF, now reduced EF as low as 20 to 25% 2020 with normalization with GDMT, thought to be nonischemic.  Cardiac MRI shows LVEF 37%, RV function 39%.  Reporting some shortness  of breath, suspect he is mildly volume up.  Will diurese based off cath if indicated Off midodrine  GDMT: Spironolactone  12.5 mg, carvedilol  6.25 mg twice daily (may need to stop this if low output).  Titrate postcatheterization  Paroxysmal SVT Noted on EKG at any point hospital.  5 minutes.  No recurrences.  Continue beta-blocker  Anemia Baseline 9-11.  Fluctuated throughout this admission and currently 7.6 today.  No obvious signs of bleeding.  Risk discussed with sister.  Otherwise, per primary team: - Fever - Hepatitis C - Liver cirrhosis - GERD - Gout - Hypoalbuminemia - Failure to thrive - Leg pain  For questions or updates, please contact [facility] Please consult [email or web address] for contact info under        Signed, Thom LITTIE Sluder, PA-C

## 2023-11-03 DIAGNOSIS — R627 Adult failure to thrive: Secondary | ICD-10-CM | POA: Diagnosis not present

## 2023-11-03 DIAGNOSIS — Z7189 Other specified counseling: Secondary | ICD-10-CM | POA: Diagnosis not present

## 2023-11-03 DIAGNOSIS — I409 Acute myocarditis, unspecified: Secondary | ICD-10-CM | POA: Diagnosis not present

## 2023-11-03 DIAGNOSIS — I400 Infective myocarditis: Secondary | ICD-10-CM | POA: Diagnosis not present

## 2023-11-03 DIAGNOSIS — I214 Non-ST elevation (NSTEMI) myocardial infarction: Secondary | ICD-10-CM | POA: Diagnosis not present

## 2023-11-03 DIAGNOSIS — I5020 Unspecified systolic (congestive) heart failure: Secondary | ICD-10-CM | POA: Diagnosis not present

## 2023-11-03 DIAGNOSIS — I5032 Chronic diastolic (congestive) heart failure: Secondary | ICD-10-CM | POA: Diagnosis not present

## 2023-11-03 DIAGNOSIS — Z515 Encounter for palliative care: Secondary | ICD-10-CM

## 2023-11-03 LAB — CBC
HCT: 23.8 % — ABNORMAL LOW (ref 39.0–52.0)
Hemoglobin: 7.7 g/dL — ABNORMAL LOW (ref 13.0–17.0)
MCH: 29.4 pg (ref 26.0–34.0)
MCHC: 32.4 g/dL (ref 30.0–36.0)
MCV: 90.8 fL (ref 80.0–100.0)
Platelets: 355 10*3/uL (ref 150–400)
RBC: 2.62 MIL/uL — ABNORMAL LOW (ref 4.22–5.81)
RDW: 15.9 % — ABNORMAL HIGH (ref 11.5–15.5)
WBC: 8.1 10*3/uL (ref 4.0–10.5)
nRBC: 0 % (ref 0.0–0.2)

## 2023-11-03 LAB — BASIC METABOLIC PANEL
Anion gap: 14 (ref 5–15)
BUN: 10 mg/dL (ref 8–23)
CO2: 24 mmol/L (ref 22–32)
Calcium: 9 mg/dL (ref 8.9–10.3)
Chloride: 99 mmol/L (ref 98–111)
Creatinine, Ser: 0.9 mg/dL (ref 0.61–1.24)
GFR, Estimated: >60 mL/min (ref >60)
Glucose, Bld: 93 mg/dL (ref 70–99)
Potassium: 3.8 mmol/L (ref 3.5–5.1)
Sodium: 137 mmol/L (ref 135–145)

## 2023-11-03 LAB — MAGNESIUM: Magnesium: 1.5 mg/dL — ABNORMAL LOW (ref 1.7–2.4)

## 2023-11-03 LAB — OCCULT BLOOD X 1 CARD TO LAB, STOOL: Fecal Occult Bld: NEGATIVE

## 2023-11-03 LAB — LIPOPROTEIN A (LPA): Lipoprotein (a): 66.7 nmol/L — ABNORMAL HIGH (ref <75.0)

## 2023-11-03 LAB — HAPTOGLOBIN: Haptoglobin: 519 mg/dL — ABNORMAL HIGH (ref 34–355)

## 2023-11-03 MED ORDER — SENNOSIDES-DOCUSATE SODIUM 8.6-50 MG PO TABS: 1.0000 | ORAL_TABLET | Freq: Two times a day (BID) | ORAL | Status: DC | PRN

## 2023-11-03 MED ORDER — SENNOSIDES-DOCUSATE SODIUM 8.6-50 MG PO TABS
2.0000 | ORAL_TABLET | Freq: Two times a day (BID) | ORAL | Status: AC
  Administered 2023-11-03 (×2): 2 via ORAL
  Filled 2023-11-03 (×2): qty 2

## 2023-11-03 MED ORDER — POLYETHYLENE GLYCOL 3350 17 G PO PACK: 17.0000 g | PACK | Freq: Two times a day (BID) | ORAL | Status: DC | PRN

## 2023-11-03 MED ORDER — ENOXAPARIN SODIUM 40 MG/0.4ML IJ SOSY
40.0000 mg | PREFILLED_SYRINGE | INTRAMUSCULAR | Status: DC
  Administered 2023-11-03 – 2023-11-09 (×7): 40 mg via SUBCUTANEOUS
  Filled 2023-11-03 (×7): qty 0.4

## 2023-11-03 MED ORDER — PHENOL 1.4 % MT LIQD
1.0000 | OROMUCOSAL | Status: DC | PRN
  Administered 2023-11-03 – 2023-11-05 (×2): 1 via OROMUCOSAL
  Filled 2023-11-03: qty 177

## 2023-11-03 MED ORDER — MAGNESIUM SULFATE 2 GM/50ML IV SOLN
2.0000 g | Freq: Once | INTRAVENOUS | Status: AC
  Administered 2023-11-03: 2 g via INTRAVENOUS
  Filled 2023-11-03: qty 50

## 2023-11-03 MED ORDER — ENOXAPARIN SODIUM 40 MG/0.4ML IJ SOSY: 40.0000 mg | PREFILLED_SYRINGE | INTRAMUSCULAR | Status: DC

## 2023-11-03 MED ORDER — POLYETHYLENE GLYCOL 3350 17 G PO PACK
17.0000 g | PACK | Freq: Two times a day (BID) | ORAL | Status: AC
  Filled 2023-11-03: qty 1

## 2023-11-03 NOTE — Progress Notes (Signed)
 Progress Note  Patient Name: Don [name] Date of Encounter: 11/03/2023  Primary Cardiologist: Jayson Sierras, MD  Interval Summary   Chart reviewed including cardiac catheterization results from yesterday.  Patient does not report any chest pain or shortness of breath at rest this morning.  No palpitations.  He is afebrile and hemodynamically stable.  Vital Signs    Vitals:   11/02/23 2300 11/03/23 0534 11/03/23 0929 11/03/23 0931  BP: (!) 147/73 133/64  122/65  Pulse: 84 85 86 84  Resp: 18 20 16 18   Temp:  99.9 F (37.7 C)  99 F (37.2 C)  TempSrc:  Oral  Oral  SpO2: 95% 93%  95%  Weight:      Height:        Intake/Output Summary (Last 24 hours) at 11/03/2023 1140 Last data filed at 11/03/2023 0053 Gross per 24 hour  Intake 480 ml  Output 2200 ml  Net -1720 ml   Filed Weights   10/28/23 1856 10/30/23 1725  Weight: 75 kg 74.7 kg    Physical Exam   GEN: No acute distress.  Appears frail. Neck: No JVD. Cardiac: RRR with ectopy, no murmur or gallop.  Respiratory: Nonlabored. Clear to auscultation bilaterally. GI: Soft, nontender, bowel sounds present. MS: No edema.  ECG/Telemetry    Telemetry reviewed showing sinus rhythm with frequent PACs and bursts of SVT.  Labs    Chemistry Recent Labs  Lab 10/28/23 1933 10/29/23 9483 10/30/23 0304 10/31/23 0433 10/31/23 0810 11/01/23 0439 11/02/23 0725 11/02/23 1531 11/02/23 1534 11/03/23 0318  NA 136   < > 137   < > 137 140 137 138  139 135 137  K 3.7   < > 3.9   < > 3.4* 3.8 3.6 3.8  3.8 3.5 3.8  CL 101   < > 102   < > 102 107 103  --   --  99  CO2 26   < > 22   < > 25 24 24   --   --  24  GLUCOSE 98   < > 87   < > 100* 129* 94  --   --  93  BUN 12   < > 9   < > 7* 13 10  --   --  10  CREATININE 0.90   < > 0.79   < > 0.90 0.98 0.88  --   --  0.90  CALCIUM  8.7*   < > 9.0   < > 8.9 8.9 8.8*  --   --  9.0  PROT 6.1*  --  6.4*  --   --  5.2*  --   --   --   --   ALBUMIN  2.3*  --  2.3*  --  2.0* 1.7*  --    --   --   --   AST 33  --  27  --   --  26  --   --   --   --   ALT 24  --  21  --   --  20  --   --   --   --   ALKPHOS 122  --  126  --   --  121  --   --   --   --   BILITOT 0.7  --  1.1  --   --  0.6  --   --   --   --   GFRNONAA >60   < > >  60   < > >60 >60 >60  --   --  >60  ANIONGAP 9   < > 13   < > 10 9 10   --   --  14   < > = values in this interval not displayed.    Hematology Recent Labs  Lab 11/01/23 1415 11/02/23 0725 11/02/23 1531 11/02/23 1534 11/03/23 0318  WBC 9.0 9.4  --   --  8.1  RBC 2.69* 2.56*  --   --  2.62*  HGB 8.1* 7.6* 8.2*  7.8* 8.2* 7.7*  HCT 25.2* 23.6* 24.0*  23.0* 24.0* 23.8*  MCV 93.7 92.2  --   --  90.8  MCH 30.1 29.7  --   --  29.4  MCHC 32.1 32.2  --   --  32.4  RDW 15.8* 15.9*  --   --  15.9*  PLT 375 353  --   --  355   Cardiac Enzymes Recent Labs  Lab 10/28/23 2137 10/29/23 0516 10/29/23 0822 10/29/23 1134 10/31/23 0433  TROPONINIHS 597* 1,084* 779* 580* 336*   Lipid Panel     Component Value Date/Time   CHOL 94 10/29/2023 0516   TRIG 67 10/29/2023 0516   HDL 41 10/29/2023 0516   CHOLHDL 2.3 10/29/2023 0516   VLDL 13 10/29/2023 0516   LDLCALC 40 10/29/2023 0516    Cardiac Studies   Cardiac catheterization 11/02/2023:   Mid LAD lesion is 40% stenosed.   1st Diag lesion is 90% stenosed.   Prox Cx lesion is 40% stenosed.   1.  High-grade ostial lesion of very small diagonal which should be treated medically with mild to moderate disease elsewhere. 2.  Fick cardiac output of 9.7 L/min and Fick cardiac index of 4.9 L/min/m with the following hemodynamics:            Right atrial pressure mean of 15 mmHg            Right ventricular pressure 68/5 with an end-diastolic pressure of 28 mmHg            Wedge pressure mean of 27 mmHg with V waves to 30 mmHg            PA pressure 70/29 with a mean of 47 mmHg            PVR 2.1 Woods units            PA pulsatility index of 2.7 3.  LVEDP of 24 mmHg   Recommendation: The  patient was administered 40 mg of Lasix  in the cardiac catheterization laboratory; continue diuretic therapy and medical optimization.  The results were reviewed with Dr. Mona.  Assessment & Plan   1.  Acute on chronic HFrEF, cardiac MRI on February 6 demonstrating LVEF 37%, RVEF 39%, and LGE pattern suggesting acute myocarditis or sarcoidosis.  ESR 103 and CRP 19.  Left and right heart catheterization yesterday documented above with hemodynamics showing high output, cardiac index (4.9), increased filling pressures and pulmonary venous hypertension.  Seen by heart failure team on February 6 at this point plan is medical therapy, not candidate for advanced treatment modalities.  Recommendation from heart failure team was to hold off on steroids unless cardiac output was severely reduced, which is not the case.  ID service subsequently consulted by primary team and recommending steroids.  He is currently on Coreg  and Aldactone .  2.  PSVT.  3.  History of hepatitis C with hepatic cirrhosis.  4.  Acute on chronic anemia, no obvious bleeding.  Being followed by primary team.  Hemoglobin 7.7 and relatively stable, normal MCV.  Stool guaiac negative.  5.  Mixed hyperlipidemia, on Crestor .  Discussed case with Dr. Gardenia on the heart failure team.  For now would hold off on steroids unless ventricular ectopy becomes very frequent.  He is on Coreg  and Aldactone .  May want to consider palliative care consultation.  For questions or updates, please contact [facility] Please consult [email or web address] for contact info under   Signed, Jayson Sierras, MD  11/03/2023, 11:40 AM

## 2023-11-03 NOTE — Consult Note (Signed)
 [facility]       Reason for Consult: Fever    Referring Physician: Dr. Kathrin  Principal Problem:   Acute myocarditis Active Problems:   Gout   Heart failure with improved ejection fraction (HFimpEF) (HCC)   GERD without esophagitis   Dyslipidemia   Non-ST elevation (NSTEMI) myocardial infarction (HCC)    allopurinol   150 mg Oral Daily   ascorbic acid   500 mg Oral Q breakfast   aspirin  EC  81 mg Oral Daily   calcitRIOL   0.25 mcg Oral Daily   carvedilol   6.25 mg Oral BID WC   cholecalciferol   1,000 Units Oral Daily   cyanocobalamin   1,000 mcg Intramuscular Once per day on Monday Friday   diclofenac  Sodium  2 g Topical QID   feeding supplement  237 mL Oral BID BM   ferrous sulfate   325 mg Oral Q breakfast   multivitamin with minerals  1 tablet Oral Daily   pantoprazole   40 mg Oral Daily   polyethylene glycol  17 g Oral BID   rosuvastatin   5 mg Oral Daily   senna-docusate  2 tablet Oral BID   sodium chloride  flush  3 mL Intravenous Q12H   spironolactone   12.5 mg Oral Daily   umeclidinium bromide   1 puff Inhalation Daily    Recommendations: Continue with supportive care I agree with steroid treatment  Assessment: He has presentation of chest pain with fever, elevated inflammatory markers and cardiac workup most consistent with acute myocarditis.  He has had recent HIV testing that was negative.  Viral etiology is possible but respiratory viral panel is negative.  No known sick contacts.  Unclear etiology but agree with treatment with steroids.  ID team will otherwise sign off, please call with any questions  HPI: Don FRIZELL is a 78 y.o. male SN[facility] resident recently admitted for orthostasis comes back here now with fever and chest pain.  He was found to have elevated troponins, elevated sed rate and CRP and cardiac workup consistent with acute myocarditis.  He does not recall any previous fever prior to admission, he was having no symptoms  including no cough, no shortness of breath.  No diarrhea or rash.  Cardiac MRI with global ECV elevation consistent with myocarditis.  He has undergone right and left heart cath.   Review of Systems:  Constitutional: positive for fevers All other systems reviewed and are negative    Past Medical History:  Diagnosis Date   Aortic atherosclerosis (HCC)    Cardiomyopathy (HCC)    a. EF 20 to 25% by echo in 03/2019.   Cerebrovascular disease    Chronic HFrEF (heart failure with reduced ejection fraction) (HCC)    Chronic kidney disease, stage 3a (HCC)    Cirrhosis (HCC)    Essential hypertension    Gout    Hepatitis C    Hyperlipidemia     Social History   Tobacco Use   Smoking status: Former    Current packs/day: 0.00    Types: Cigarettes    Quit date: 09/29/2008    Years since quitting: 15.1   Smokeless tobacco: Never   Tobacco comments:    years ago  Vaping Use   Vaping status: Never Used  Substance Use Topics   Alcohol use: No    Alcohol/week: 0.0 standard drinks of alcohol    Comment: none in several years.    Drug use: Not Currently    Comment: stopped 2-3 yrs ago  Family History  Problem Relation Age of Onset   Hypertension Sister     Allergies  Allergen Reactions   Penicillins Swelling    Physical Exam: Constitutional: in no apparent distress  Vitals:   11/03/23 0929 11/03/23 0931  BP:  122/65  Pulse: 86   Resp: 16 18  Temp:  99 F (37.2 C)  SpO2:     EYES: anicteric Respiratory: Normal respiratory effort Musculoskeletal: no edema  Lab Results  Component Value Date   WBC 8.1 11/03/2023   HGB 7.7 (L) 11/03/2023   HCT 23.8 (L) 11/03/2023   MCV 90.8 11/03/2023   PLT 355 11/03/2023    Lab Results  Component Value Date   CREATININE 0.90 11/03/2023   BUN 10 11/03/2023   NA 137 11/03/2023   K 3.8 11/03/2023   CL 99 11/03/2023   CO2 24 11/03/2023    Lab Results  Component Value Date   ALT 20 11/01/2023   AST 26 11/01/2023   ALKPHOS  121 11/01/2023     Microbiology: Recent Results (from the past 240 hours)  Resp panel by RT-PCR (RSV, Flu A&B, Covid) Anterior Nasal Swab     Status: None   Collection Time: 10/28/23  7:22 PM   Specimen: Anterior Nasal Swab  Result Value Ref Range Status   SARS Coronavirus 2 by RT PCR NEGATIVE NEGATIVE Final    Comment: (NOTE) SARS-CoV-2 target nucleic acids are NOT DETECTED.  The SARS-CoV-2 RNA is generally detectable in upper respiratory specimens during the acute phase of infection. The lowest concentration of SARS-CoV-2 viral copies this assay can detect is 138 copies/mL. A negative result does not preclude SARS-Cov-2 infection and should not be used as the sole basis for treatment or other patient management decisions. A negative result may occur with  improper specimen collection/handling, submission of specimen other than nasopharyngeal swab, presence of viral mutation(s) within the areas targeted by this assay, and inadequate number of viral copies(<138 copies/mL). A negative result must be combined with clinical observations, patient history, and epidemiological information. The expected result is Negative.  Fact Sheet for Patients:  bloggercourse.com  Fact Sheet for Healthcare Providers:  seriousbroker.it  This test is no t yet approved or cleared by the [place]  FDA and  has been authorized for detection and/or diagnosis of SARS-CoV-2 by FDA under an Emergency Use Authorization (EUA). This EUA will remain  in effect (meaning this test can be used) for the duration of the COVID-19 declaration under Section 564(b)(1) of the Act, 21 U.S.C.section 360bbb-3(b)(1), unless the authorization is terminated  or revoked sooner.       Influenza A by PCR NEGATIVE NEGATIVE Final   Influenza B by PCR NEGATIVE NEGATIVE Final    Comment: (NOTE) The Xpert Xpress SARS-CoV-2/FLU/RSV plus assay is intended as an aid in the  diagnosis of influenza from Nasopharyngeal swab specimens and should not be used as a sole basis for treatment. Nasal washings and aspirates are unacceptable for Xpert Xpress SARS-CoV-2/FLU/RSV testing.  Fact Sheet for Patients: bloggercourse.com  Fact Sheet for Healthcare Providers: seriousbroker.it  This test is not yet approved or cleared by the [place]  FDA and has been authorized for detection and/or diagnosis of SARS-CoV-2 by FDA under an Emergency Use Authorization (EUA). This EUA will remain in effect (meaning this test can be used) for the duration of the COVID-19 declaration under Section 564(b)(1) of the Act, 21 U.S.C. section 360bbb-3(b)(1), unless the authorization is terminated or revoked.     Resp Syncytial  Virus by PCR NEGATIVE NEGATIVE Final    Comment: (NOTE) Fact Sheet for Patients: bloggercourse.com  Fact Sheet for Healthcare Providers: seriousbroker.it  This test is not yet approved or cleared by the [place]  FDA and has been authorized for detection and/or diagnosis of SARS-CoV-2 by FDA under an Emergency Use Authorization (EUA). This EUA will remain in effect (meaning this test can be used) for the duration of the COVID-19 declaration under Section 564(b)(1) of the Act, 21 U.S.C. section 360bbb-3(b)(1), unless the authorization is terminated or revoked.  Performed at [facility], [address], [place], [place] [address]   Blood Culture (routine x 2)     Status: None   Collection Time: 10/28/23  7:33 PM   Specimen: Left Antecubital; Blood  Result Value Ref Range Status   Specimen Description LEFT ANTECUBITAL BLOOD  Final   Special Requests   Final    BOTTLES DRAWN AEROBIC AND ANAEROBIC Blood Culture adequate volume   Culture   Final    NO GROWTH 5 DAYS Performed at [facility], [address] [place], [place] [address]    Report Status  11/02/2023 FINAL  Final  Blood Culture (routine x 2)     Status: None   Collection Time: 10/28/23  7:43 PM   Specimen: BLOOD RIGHT FOREARM  Result Value Ref Range Status   Specimen Description BLOOD RIGHT FOREARM BLOOD  Final   Special Requests   Final    BOTTLES DRAWN AEROBIC AND ANAEROBIC Blood Culture adequate volume   Culture   Final    NO GROWTH 5 DAYS Performed at [facility], [address] [place], [place] [address]    Report Status 11/02/2023 FINAL  Final  Respiratory (~20 pathogens) panel by PCR     Status: None   Collection Time: 10/29/23  5:20 PM   Specimen: Nasopharyngeal Swab; Respiratory  Result Value Ref Range Status   Adenovirus NOT DETECTED NOT DETECTED Final   Coronavirus 229E NOT DETECTED NOT DETECTED Final    Comment: (NOTE) The Coronavirus on the Respiratory Panel, DOES NOT test for the novel  Coronavirus (2019 nCoV)    Coronavirus HKU1 NOT DETECTED NOT DETECTED Final   Coronavirus NL63 NOT DETECTED NOT DETECTED Final   Coronavirus OC43 NOT DETECTED NOT DETECTED Final   Metapneumovirus NOT DETECTED NOT DETECTED Final   Rhinovirus / Enterovirus NOT DETECTED NOT DETECTED Final   Influenza A NOT DETECTED NOT DETECTED Final   Influenza B NOT DETECTED NOT DETECTED Final   Parainfluenza Virus 1 NOT DETECTED NOT DETECTED Final   Parainfluenza Virus 2 NOT DETECTED NOT DETECTED Final   Parainfluenza Virus 3 NOT DETECTED NOT DETECTED Final   Parainfluenza Virus 4 NOT DETECTED NOT DETECTED Final   Respiratory Syncytial Virus NOT DETECTED NOT DETECTED Final   Bordetella pertussis NOT DETECTED NOT DETECTED Final   Bordetella Parapertussis NOT DETECTED NOT DETECTED Final   Chlamydophila pneumoniae NOT DETECTED NOT DETECTED Final   Mycoplasma pneumoniae NOT DETECTED NOT DETECTED Final    Comment: Performed at [facility], [address] [place], [place] [address]    Lamar LELON Bucks, MD [facility] [email or web address] 11/03/2023, 10:00 AM

## 2023-11-03 NOTE — Consult Note (Signed)
 Consultation Note Date: 11/03/2023   Patient Name: Don [name]  DOB: 07/10/1946  MRN: [id number]  Age / Sex: 78 y.o., male  PCP: [facility] Referring Physician: Kathrin Mignon DASEN, MD  Reason for Consultation: Establishing goals of care  HPI/Patient Profile: 78 y.o. male  with past medical history of HFimpEF, liver cirrhosis, hepatitis C, hypotension on midodrine , anemia, HTN, HLD and gout admitted on 10/28/2023 with acute onset substernal chest pain and fever . 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 Other differential diagnosis including sarcoidosis, and a small pericardial effusion.  Patient also with ongoing intermittent fever with unclear source.  PMT consulted to discuss goals of care.  Clinical Assessment and Goals of Care: I have reviewed medical records including EPIC notes, labs and imaging, assessed the patient and then spoke with patient's Sister [name] to discuss diagnosis prognosis, GOC, EOL wishes, disposition and options.  Attempted conversation with patient however he was quite withdrawn.  When I asked him how he felt he just shook his head.  I attempted orientation questions which he would not respond to.  I asked him which family member I should call and he told me his sister.  Could not elicit any other verbal interaction.  I introduced Palliative Medicine as specialized medical care for people living with serious illness. It focuses on providing relief from the symptoms and stress of a serious illness. The goal is to improve quality of life for both the patient and the family.  We discussed a brief life review of the patient.  [name] shares the patient was never married and has no kids.  He lives with her.  They also have an older brother however [name] shares the older brother's health is not good either.  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  As far as functional and  nutritional status [name] shares the patient has not walked in almost 6 months.  She is shares he is dependent on her for most ADLs.  She shares his appetite fluctuated.  She shares of some confusion at home and difficulty sleeping.  [name] shares patient's gout gave him a lot of problems and she feels it took over his body.   We discussed patient's current illness and what it means in the larger context of patient's on-going co-morbidities.  We discussed his overall frailty and his myocarditis.  I attempted to elicit values and goals of care important to the patient.    [name] is hesitant to talk about goals of care or patient's thoughts on medical interventions as she tells me they have never discussed this before.  I attempted to speak some about medical care but she tells me we need to talk to the patient about that.  I explained to [name] that right now Mr. Flippin is not in a state where he can have these conversations and she expresses understanding but remains hesitant to speak for him.  She is hopeful for improvement in his mental status so that he can share his own wishes with this.  [name] does share about other medical interventions that members of their family have underwent.  I asked her if Mr. Flippin ever told her how he felt about that and she says he has not.  Discussed with [name] the importance of continued conversation with family and the medical providers regarding overall plan of care and treatment options, ensuring decisions are within the context of the patient's values and GOCs.  [name] shares she may be coming to the hospital next week and will be open to more conversation then I shared palliative team number with her.  She will call if she plans to come to the hospital.   Questions and concerns were addressed. The family was encouraged to call with questions or concerns.  Primary Decision Maker NEXT OF KIN -Sister [name]    SUMMARY OF RECOMMENDATIONS   -Patient very  withdrawn and does not engage in goals of care discussions at this time -His next of kin is his sister and brother however sister shares that his brother is in poor health (never married, no children) -Attempted goals of care discussion with patient's Sister [name] however she is very hesitant to discuss this and shares she would like the patient to make decisions for himself, she is hopeful for improvement mental status so that he can engage in goals of care discussions -[name] is open to ongoing conversations and shares she may be at the hospital some next week and will call when she arrives  Code Status/Advance Care Planning: Full code by default - patient unable to discuss and sister not wanting to make decisions for patient at this time as she is unsure of his wishes      Primary Diagnoses: Present on Admission:  Acute myocarditis  Gout  HFrEF (heart failure with reduced ejection fraction) (HCC)   I have reviewed the medical record, interviewed the patient and family, and examined the patient. The following aspects are pertinent.  Past Medical History:  Diagnosis Date   Aortic atherosclerosis (HCC)    Cardiomyopathy (HCC)    a. EF 20 to 25% by echo in 03/2019.   Cerebrovascular disease    Chronic HFrEF (heart failure with reduced ejection fraction) (HCC)    Chronic kidney disease, stage 3a (HCC)    Cirrhosis (HCC)    Essential hypertension    Gout    Hepatitis C    Hyperlipidemia    Social History   Socioeconomic History   Marital status: Single    Spouse name: Not on file   Number of children: Not on file   Years of education: Not on file   Highest education level: Not on file  Occupational History   Not on file  Tobacco Use   Smoking status: Former    Current packs/day: 0.00    Types: Cigarettes    Quit date: 09/29/2008    Years since quitting: 15.1   Smokeless tobacco: Never   Tobacco comments:    years ago  Vaping Use   Vaping status: Never Used  Substance  and Sexual Activity   Alcohol use: No    Alcohol/week: 0.0 standard drinks of alcohol    Comment: none in several years.    Drug use: Not Currently    Comment: stopped 2-3 yrs ago   Sexual activity: Not on file  Other Topics Concern   Not on file  Social History Narrative   Not on file   Social Drivers of Health   Financial Resource Strain: Not on file  Food Insecurity: Patient Unable To Answer (10/30/2023)   Hunger Vital Sign    Worried About Running Out of Food in the Last Year: Patient unable to answer    Ran Out of Food in the Last Year: Patient unable to answer  Transportation Needs: No Transportation Needs (10/14/2023)   PRAPARE - Administrator, Civil Service (Medical): No    Lack of Transportation (Non-Medical): No  Physical Activity: Not on file  Stress: Not on file  Social Connections: Unknown (10/31/2023)   Social Connection and Isolation Panel [NHANES]    Frequency of Communication with Friends and Family: Patient unable to answer    Frequency of Social Gatherings with Friends and Family: Once a week    Attends Religious Services: Never    Database Administrator or Organizations: No    Attends Engineer, Structural: Never    Marital Status: Never married   Family History  Problem Relation Age of Onset   Hypertension Sister    Scheduled Meds:  allopurinol   150 mg Oral Daily   ascorbic acid   500 mg Oral Q breakfast   aspirin  EC  81 mg Oral Daily   calcitRIOL   0.25 mcg Oral Daily   carvedilol   6.25 mg Oral BID WC   cholecalciferol   1,000 Units Oral Daily   cyanocobalamin   1,000 mcg Intramuscular Once per day on Monday Friday   diclofenac  Sodium  2 g Topical QID   feeding supplement  237 mL Oral BID BM   ferrous sulfate   325 mg Oral Q breakfast   multivitamin with minerals  1 tablet Oral Daily   pantoprazole   40 mg Oral Daily   polyethylene glycol  17 g Oral BID   rosuvastatin   5 mg Oral Daily   senna-docusate  2 tablet Oral BID   sodium  chloride flush  3 mL Intravenous Q12H   spironolactone   12.5 mg Oral Daily   umeclidinium bromide   1 puff Inhalation Daily   Continuous Infusions:  sodium chloride      PRN Meds:.sodium chloride , acetaminophen , albuterol , colchicine , hydrALAZINE , nitroGLYCERIN , ondansetron  **OR** ondansetron  (ZOFRAN ) IV, polyethylene glycol **FOLLOWED BY** [START ON 11/04/2023] polyethylene glycol, senna-docusate **FOLLOWED BY** [START ON 11/04/2023] senna-docusate, sodium chloride  flush, traZODone  Allergies  Allergen Reactions   Penicillins Swelling   Review of Systems  Unable to perform ROS: Mental status change    Physical Exam Constitutional:      General: He is not in acute distress.    Appearance: He is ill-appearing.     Comments: Wakes easily to voice but interaction is minimal, appears fatigued  Cardiovascular:     Rate and Rhythm: Normal rate.  Pulmonary:     Effort: Pulmonary effort is normal.  Musculoskeletal:     Right lower leg: No edema.     Left lower leg: No edema.  Skin:    General: Skin is warm and dry.  Neurological:     Comments: Does not answer orientation questions     Vital Signs: BP (!) 116/59 (BP Location: Left Arm)   Pulse 84   Temp 99.2 F (37.3 C) (Oral)   Resp 20   Ht 6' (1.829 m)   Wt 74.7 kg   SpO2 95%   BMI 22.34 kg/m  Pain Scale: 0-10 POSS *See Group Information*: 1-Acceptable,Awake and alert Pain Score: 0-No pain   SpO2: SpO2: 95 % O2 Device:SpO2: 95 % O2 Flow Rate: .   IO: Intake/output summary:  Intake/Output Summary (Last 24 hours) at 11/03/2023 1441 Last data filed at 11/03/2023 1200 Gross per 24 hour  Intake 830 ml  Output 2200 ml  Net -1370 ml    LBM: Last BM Date : 11/02/23 Baseline Weight: Weight: 75 kg Most recent weight: Weight: 74.7 kg     Palliative Assessment/Data: PPS 30%     *Please note that this is a verbal dictation therefore any spelling or grammatical errors are due to  the [employer] One system  interpretation.   Time Total: 60 minutes Time spent includes: Detailed review of medical records (labs, imaging, vital signs), medically appropriate exam, discussion with treatment team, counseling and educating patient, family and/or staff, documenting clinical information, medication management and coordination of care.    Tobey Jama Barnacle, DNP, AGNP-C Palliative Medicine Team [phone] Pager: [phone]

## 2023-11-03 NOTE — TOC Progression Note (Signed)
 Transition of Care [facility]) - Progression Note    Patient Details  Name: Don [name] MRN: [id number] Date of Birth: May 23, 1946  Transition of [facility]) CM/SW Contact  Olam FORBES Ally, LCSW Phone Number: 11/03/2023, 11:32 AM  Clinical Narrative:     Patient's authorization is still pending.  TOC team will continue to assist with discharge planning needs.   Expected Discharge Plan: Skilled Nursing Facility Barriers to Discharge: Continued Medical Work up  Expected Discharge Plan and Services In-house Referral: Clinical Social Work     Living arrangements for the past 2 months: Skilled Nursing Facility                                       Social Determinants of Health (SDOH) Interventions SDOH Screenings   Food Insecurity: Patient Unable To Answer (10/30/2023)  Housing: Low Risk  (10/14/2023)  Transportation Needs: No Transportation Needs (10/14/2023)  Utilities: Not At Risk (10/14/2023)  Social Connections: Unknown (10/31/2023)  Tobacco Use: Medium Risk (10/28/2023)    Readmission Risk Interventions    10/29/2023   10:25 AM 10/14/2023   11:02 AM  Readmission Risk Prevention Plan  Transportation Screening Complete Complete  HRI or Home Care Consult  Complete  Social Work Consult for Recovery Care Planning/Counseling  Complete  Palliative Care Screening  Not Applicable  Medication Review Oceanographer) Complete Complete  HRI or Home Care Consult Complete   SW Recovery Care/Counseling Consult Complete   Palliative Care Screening Not Applicable   Skilled Nursing Facility Complete

## 2023-11-03 NOTE — Progress Notes (Signed)
 PROGRESS NOTE  JABRI BLANCETT FMW:[id number] DOB: Sep 20, 1946   PCP: [facility]  Patient is from: SNF.  Nonambulatory at baseline.  DOA: 10/28/2023 LOS: 6  Chief complaints Chief Complaint  Patient presents with   Chest Pain     Brief Narrative / Interim history: 78 year old M with PMH of HFimpEF, liver cirrhosis, hepatitis C, hypotension on midodrine , anemia, HTN, HLD and gout brought to [facility] by EMS from [facility] on 2/2 due to acute onset substernal chest pain and fever.    In the ED, temp 101.8 F, HR 160, RR 20, BP 175/105, SpO2 95% on room air.  WBC 9.7, hemoglobin 8.3, platelet count 381.  CMP without significant finding.  Trop 77>597.  EKG notable for supraventricular tachycardia, rate 158. Lactic acid 1.2.  COVID/influenza/RSV PCR negative.  UA and CXR without significant finding. Blood cultures x 2 obtained.  Started on broad-spectrum antibiotics.  Cardiology consulted.  Admission requested for acute myocarditis.  Patient was evaluated by cardiology who recommended transfer to [facility] for further cardiac evaluation  SVT and fever resolved.  RVP and blood culture negative.  Antibiotics discontinued.  Elevated inflammatory markers, intermittent fever and acute on chronic anemia without explanation.   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  Other differential diagnosis including sarcoidosis, and a small pericardial effusion.  Patient underwent R/LHC on 2/7 that showed mild LAD and proximal LCx lesion and high-grade ostial lesion of very small diagonal.  No stent placement.  Cardiology recommended medical management.  Patient continued to spike intermittent fever with markedly elevated inflammatory markers.  No clear source of infection.  ID consulted but did not feel there is active infection and signed off.  Palliative medicine consulted as well.   Subjective: Seen and examined earlier this morning.  No major events overnight of this  morning.  No complaints but not a great historian.  He is a kind of withdrawn.  No further fever but mild temp to 99 yesterday.  Objective: Vitals:   11/02/23 2300 11/03/23 0534 11/03/23 0929 11/03/23 0931  BP: (!) 147/73 133/64  122/65  Pulse: 84 85 86 84  Resp: 18 20 16 18   Temp:  99.9 F (37.7 C)  99 F (37.2 C)  TempSrc:  Oral  Oral  SpO2: 95% 93%  95%  Weight:      Height:        Examination:  GENERAL: No apparent distress.  Appears frail. HEENT: MMM.  Vision and hearing grossly intact.  NECK: Supple.  No apparent JVD.  RESP:  No IWOB.  Fair aeration bilaterally. CVS:  RRR. Heart sounds normal.  ABD/GI/GU: BS+. Abd soft, NTND.  MSK/EXT:  Moves extremities.  Some swelling in right forearm but no erythema or increased warmth to touch.  Fair range of motion in right elbow. SKIN: no apparent skin lesion or wound NEURO: Awake, alert and oriented appropriately.  No apparent focal neuro deficit. PSYCH: Somewhat withdrawn with flat affect.  Procedures:  12/7-R/LHC on 2/7 that showed mild LAD and proximal LCx lesion and high-grade ostial lesion of very small diagonal.  Microbiology summarized: COVID-19, influenza and RSV PCR nonreactive A 20 pathogen RVP nonreactive Blood cultures negative  Assessment and plan: Acute myocarditis/suspected non-STEMI: Presented with acute substernal chest pain and fever.  Troponin 77>>> 1600>> 336 suggesting ACS.  Limited TTE without significant finding.  Infectious workup unrevealing.  Pro-Cal negative.  CRP elevated to 15>> 18.  ESR elevated to 90>> 104.  ANA normal.  Cardiac MRI concerning for acute myocarditis.  R/LHC as above. -Appreciate help by cardiology-steroid for myocarditis? -Medical management for CAD. -Follow SPEP. -Continue low-dose aspirin    Supraventricular tachycardia: HR elevated to 160s on arrival.  History of BB intolerance.  Resolved.  -Continue Coreg .  Tolerating. -Optimize electrolytes. -Continue telemetry    Intermittent fever: Unclear source.  Infectious workup including CXR, UA, cultures and viral panel unrevealing.  Antibiotics discontinued. Inflammatory markers markedly elevated.  MRI suggestive of acute myocarditis.  Left foot and right elbow x-ray without significant finding.  Low suspicion for active infection per ID.  ID signed off.  Chronic HFimpEF: Limited TTE with LVEF of 55 to 60% (previously 20 to 25%)., no RWMA.  BNP elevated to 1600.  Appears euvolemic on exam.  Currently without cardiopulmonary symptoms.  Not on diuretics.  Cardiac MRI and R/LHC as above. -Continue Coreg  as above -Did not tolerate GDMT in the past due to orthostatic hypotension. -Strict intake and output, daily weight and renal functions  History of gout without acute flareup: While he is unusually high dose colchicine .  He is also on a statin which could increase his risk of rhabdo.  CK and uric acid not elevated. -Decreased colchicine  to 0.6 mg daily as needed. -Continue home allopurinol .  Acute on chronic anemia: Although Hgb dropped about 2 g in the last 24 hours, baseline about 8.0.  No overt bleeding.  LDH within normal.  Haptoglobin slightly elevated arguing against hemolysis.  Blood smear without significant finding.  Inflammatory markers markedly elevated.  Hemoccult negative.  H&H relatively stable now Recent Labs    10/30/23 0304 10/31/23 0433 10/31/23 0810 11/01/23 0439 11/01/23 1000 11/01/23 1415 11/02/23 0725 11/02/23 1531 11/02/23 1534 11/03/23 0318  HGB 9.2* 8.7* 9.6* 7.7* 7.6* 8.1* 7.6* 8.2*  7.8* 8.2* 7.7*  -Resume Lovenox  for VTE prophylaxis  Significantly elevated CRP and ESR with intermittent fever: Due to acute myocarditis?  No clear source of infection.  ID signed off.  History of liver cirrhosis: Compensated.  Euthyroid sick syndrome: Mild elevated TSH with normal free T4. -Repeat in 4 to 6 weeks  CKD 3A ruled out.  Hyperlipidemia -Continue Crestor  5 mg p.o. daily  History  of hepatitis C Outpatient follow-up with gastroenterology/infectious disease.  Bilateral leg pain: Exam reassuring except for left foot tenderness.  X-ray negative.  No signs of infection. -Tylenol  as needed -Voltaren  gel  Ambulatory dysfunction: Patient reports using chairs with wheels to get around.  -PT/OT eval   GERD -Protonix  40 mg p.o. daily  Physical deconditioning: Too deconditioned and frail.  Poor overall prognosis.  Still full code. -Palliative consulted.  Follow recommendations   Body mass index is 22.34 kg/m.           DVT prophylaxis:  enoxaparin  (LOVENOX ) injection 40 mg Start: 11/03/23 1315 Place and maintain sequential compression device Start: 11/01/23 1234  Code Status: Full code Family Communication: None at bedside. Level of care: Progressive Status is: Inpatient Remains inpatient appropriate because: Non-STEMI/elevated troponin/myocarditis   Final disposition: Likely back to SNF Consultants:  Cardiology Infectious disease Palliative medicine  55 minutes with more than 50% spent in reviewing records, counseling patient/family and coordinating care.   Sch Meds:  Scheduled Meds:  allopurinol   150 mg Oral Daily   ascorbic acid   500 mg Oral Q breakfast   aspirin  EC  81 mg Oral Daily   calcitRIOL   0.25 mcg Oral Daily   carvedilol   6.25 mg Oral BID WC   cholecalciferol   1,000 Units  Oral Daily   cyanocobalamin   1,000 mcg Intramuscular Once per day on Monday Friday   diclofenac  Sodium  2 g Topical QID   enoxaparin  (LOVENOX ) injection  40 mg Subcutaneous Q24H   feeding supplement  237 mL Oral BID BM   ferrous sulfate   325 mg Oral Q breakfast   multivitamin with minerals  1 tablet Oral Daily   pantoprazole   40 mg Oral Daily   polyethylene glycol  17 g Oral BID   rosuvastatin   5 mg Oral Daily   senna-docusate  2 tablet Oral BID   sodium chloride  flush  3 mL Intravenous Q12H   spironolactone   12.5 mg Oral Daily   umeclidinium bromide   1 puff  Inhalation Daily   Continuous Infusions:  sodium chloride      PRN Meds:.sodium chloride , acetaminophen , albuterol , colchicine , hydrALAZINE , nitroGLYCERIN , ondansetron  **OR** ondansetron  (ZOFRAN ) IV, polyethylene glycol **FOLLOWED BY** [START ON 11/04/2023] polyethylene glycol, senna-docusate **FOLLOWED BY** [START ON 11/04/2023] senna-docusate, sodium chloride  flush, traZODone   Antimicrobials: Anti-infectives (From admission, onward)    Start     Dose/Rate Route Frequency Ordered Stop   10/28/23 1930  vancomycin  (VANCOCIN ) IVPB 1000 mg/200 mL premix        1,000 mg 200 mL/hr over 60 Minutes Intravenous  Once 10/28/23 1922 10/28/23 2126   10/28/23 1930  ceFEPIme  (MAXIPIME ) 2 g in sodium chloride  0.9 % 100 mL IVPB        2 g 200 mL/hr over 30 Minutes Intravenous  Once 10/28/23 1922 10/28/23 2013        I have personally reviewed the following labs and images: CBC: Recent Labs  Lab 10/28/23 1933 10/29/23 0516 10/31/23 0810 11/01/23 0439 11/01/23 1000 11/01/23 1415 11/02/23 0725 11/02/23 1531 11/02/23 1534 11/03/23 0318  WBC 9.7   < > 9.0 8.5  --  9.0 9.4  --   --  8.1  NEUTROABS 7.6  --   --   --   --  7.1  --   --   --   --   HGB 8.3*   < > 9.6* 7.7*   < > 8.1* 7.6* 8.2*  7.8* 8.2* 7.7*  HCT 25.8*   < > 28.8* 23.1*   < > 25.2* 23.6* 24.0*  23.0* 24.0* 23.8*  MCV 94.9   < > 91.1 90.2  --  93.7 92.2  --   --  90.8  PLT 381   < > 429* 336  --  375 353  --   --  355   < > = values in this interval not displayed.   BMP &GFR Recent Labs  Lab 10/30/23 0304 10/31/23 0433 10/31/23 0810 11/01/23 0439 11/02/23 0725 11/02/23 1531 11/02/23 1534 11/03/23 0318  NA 137 137 137 140 137 138  139 135 137  K 3.9 3.6 3.4* 3.8 3.6 3.8  3.8 3.5 3.8  CL 102 102 102 107 103  --   --  99  CO2 22 25 25 24 24   --   --  24  GLUCOSE 87 93 100* 129* 94  --   --  93  BUN 9 6* 7* 13 10  --   --  10  CREATININE 0.79 0.88 0.90 0.98 0.88  --   --  0.90  CALCIUM  9.0 8.9 8.9 8.9 8.8*  --   --   9.0  MG 1.5* 1.9 1.8 1.8  --   --   --  1.5*  PHOS  --   --  3.6  --   --   --   --   --  Estimated Creatinine Clearance: 72.6 mL/min (by C-G formula based on SCr of 0.9 mg/dL). Liver & Pancreas: Recent Labs  Lab 10/28/23 1933 10/30/23 0304 10/31/23 0810 11/01/23 0439  AST 33 27  --  26  ALT 24 21  --  20  ALKPHOS 122 126  --  121  BILITOT 0.7 1.1  --  0.6  PROT 6.1* 6.4*  --  5.2*  ALBUMIN  2.3* 2.3* 2.0* 1.7*   No results for input(s): LIPASE, AMYLASE in the last 168 hours. No results for input(s): AMMONIA in the last 168 hours. Diabetic: No results for input(s): HGBA1C in the last 72 hours. No results for input(s): GLUCAP in the last 168 hours. Cardiac Enzymes: Recent Labs  Lab 10/31/23 0809 11/01/23 0439  CKTOTAL 33* 23*   No results for input(s): PROBNP in the last 8760 hours. Coagulation Profile: Recent Labs  Lab 10/28/23 1933  INR 1.3*   Thyroid  Function Tests: No results for input(s): TSH, T4TOTAL, FREET4, T3FREE, THYROIDAB in the last 72 hours.  Lipid Profile: No results for input(s): CHOL, HDL, LDLCALC, TRIG, CHOLHDL, LDLDIRECT in the last 72 hours.  Anemia Panel: No results for input(s): VITAMINB12, FOLATE, FERRITIN, TIBC, IRON, RETICCTPCT in the last 72 hours.  Urine analysis:    Component Value Date/Time   COLORURINE AMBER (A) 10/28/2023 1922   APPEARANCEUR CLEAR 10/28/2023 1922   LABSPEC 1.018 10/28/2023 1922   PHURINE 6.0 10/28/2023 1922   GLUCOSEU NEGATIVE 10/28/2023 1922   HGBUR NEGATIVE 10/28/2023 1922   BILIRUBINUR NEGATIVE 10/28/2023 1922   KETONESUR NEGATIVE 10/28/2023 1922   PROTEINUR NEGATIVE 10/28/2023 1922   UROBILINOGEN 1.0 02/01/2012 1300   NITRITE NEGATIVE 10/28/2023 1922   LEUKOCYTESUR NEGATIVE 10/28/2023 1922   Sepsis Labs: Invalid input(s): PROCALCITONIN, LACTICIDVEN  Microbiology: Recent Results (from the past 240 hours)  Resp panel by RT-PCR (RSV, Flu A&B, Covid)  Anterior Nasal Swab     Status: None   Collection Time: 10/28/23  7:22 PM   Specimen: Anterior Nasal Swab  Result Value Ref Range Status   SARS Coronavirus 2 by RT PCR NEGATIVE NEGATIVE Final    Comment: (NOTE) SARS-CoV-2 target nucleic acids are NOT DETECTED.  The SARS-CoV-2 RNA is generally detectable in upper respiratory specimens during the acute phase of infection. The lowest concentration of SARS-CoV-2 viral copies this assay can detect is 138 copies/mL. A negative result does not preclude SARS-Cov-2 infection and should not be used as the sole basis for treatment or other patient management decisions. A negative result may occur with  improper specimen collection/handling, submission of specimen other than nasopharyngeal swab, presence of viral mutation(s) within the areas targeted by this assay, and inadequate number of viral copies(<138 copies/mL). A negative result must be combined with clinical observations, patient history, and epidemiological information. The expected result is Negative.  Fact Sheet for Patients:  bloggercourse.com  Fact Sheet for Healthcare Providers:  seriousbroker.it  This test is no t yet approved or cleared by the [place]  FDA and  has been authorized for detection and/or diagnosis of SARS-CoV-2 by FDA under an Emergency Use Authorization (EUA). This EUA will remain  in effect (meaning this test can be used) for the duration of the COVID-19 declaration under Section 564(b)(1) of the Act, 21 U.S.C.section 360bbb-3(b)(1), unless the authorization is terminated  or revoked sooner.       Influenza A by PCR NEGATIVE NEGATIVE Final   Influenza B by PCR NEGATIVE NEGATIVE Final    Comment: (NOTE) The Xpert Xpress SARS-CoV-2/FLU/RSV plus  assay is intended as an aid in the diagnosis of influenza from Nasopharyngeal swab specimens and should not be used as a sole basis for treatment. Nasal washings  and aspirates are unacceptable for Xpert Xpress SARS-CoV-2/FLU/RSV testing.  Fact Sheet for Patients: bloggercourse.com  Fact Sheet for Healthcare Providers: seriousbroker.it  This test is not yet approved or cleared by the [place]  FDA and has been authorized for detection and/or diagnosis of SARS-CoV-2 by FDA under an Emergency Use Authorization (EUA). This EUA will remain in effect (meaning this test can be used) for the duration of the COVID-19 declaration under Section 564(b)(1) of the Act, 21 U.S.C. section 360bbb-3(b)(1), unless the authorization is terminated or revoked.     Resp Syncytial Virus by PCR NEGATIVE NEGATIVE Final    Comment: (NOTE) Fact Sheet for Patients: bloggercourse.com  Fact Sheet for Healthcare Providers: seriousbroker.it  This test is not yet approved or cleared by the [place]  FDA and has been authorized for detection and/or diagnosis of SARS-CoV-2 by FDA under an Emergency Use Authorization (EUA). This EUA will remain in effect (meaning this test can be used) for the duration of the COVID-19 declaration under Section 564(b)(1) of the Act, 21 U.S.C. section 360bbb-3(b)(1), unless the authorization is terminated or revoked.  Performed at [facility], [address] [place], [place] [address]   Blood Culture (routine x 2)     Status: None   Collection Time: 10/28/23  7:33 PM   Specimen: Left Antecubital; Blood  Result Value Ref Range Status   Specimen Description LEFT ANTECUBITAL BLOOD  Final   Special Requests   Final    BOTTLES DRAWN AEROBIC AND ANAEROBIC Blood Culture adequate volume   Culture   Final    NO GROWTH 5 DAYS Performed at [facility], [address], [place], [place] [address]    Report Status 11/02/2023 FINAL  Final  Blood Culture (routine x 2)     Status: None   Collection Time: 10/28/23  7:43 PM   Specimen: BLOOD  RIGHT FOREARM  Result Value Ref Range Status   Specimen Description BLOOD RIGHT FOREARM BLOOD  Final   Special Requests   Final    BOTTLES DRAWN AEROBIC AND ANAEROBIC Blood Culture adequate volume   Culture   Final    NO GROWTH 5 DAYS Performed at [facility], [address], [place], [place] [address]    Report Status 11/02/2023 FINAL  Final  Respiratory (~20 pathogens) panel by PCR     Status: None   Collection Time: 10/29/23  5:20 PM   Specimen: Nasopharyngeal Swab; Respiratory  Result Value Ref Range Status   Adenovirus NOT DETECTED NOT DETECTED Final   Coronavirus 229E NOT DETECTED NOT DETECTED Final    Comment: (NOTE) The Coronavirus on the Respiratory Panel, DOES NOT test for the novel  Coronavirus (2019 nCoV)    Coronavirus HKU1 NOT DETECTED NOT DETECTED Final   Coronavirus NL63 NOT DETECTED NOT DETECTED Final   Coronavirus OC43 NOT DETECTED NOT DETECTED Final   Metapneumovirus NOT DETECTED NOT DETECTED Final   Rhinovirus / Enterovirus NOT DETECTED NOT DETECTED Final   Influenza A NOT DETECTED NOT DETECTED Final   Influenza B NOT DETECTED NOT DETECTED Final   Parainfluenza Virus 1 NOT DETECTED NOT DETECTED Final   Parainfluenza Virus 2 NOT DETECTED NOT DETECTED Final   Parainfluenza Virus 3 NOT DETECTED NOT DETECTED Final   Parainfluenza Virus 4 NOT DETECTED NOT DETECTED Final   Respiratory Syncytial Virus NOT DETECTED NOT  DETECTED Final   Bordetella pertussis NOT DETECTED NOT DETECTED Final   Bordetella Parapertussis NOT DETECTED NOT DETECTED Final   Chlamydophila pneumoniae NOT DETECTED NOT DETECTED Final   Mycoplasma pneumoniae NOT DETECTED NOT DETECTED Final    Comment: Performed at [facility], [address], [place], [place] [address]    Radiology Studies: CARDIAC CATHETERIZATION Result Date: 11/02/2023   Mid LAD lesion is 40% stenosed.   1st Diag lesion is 90% stenosed.   Prox Cx lesion is 40% stenosed. 1.  High-grade ostial lesion of very small diagonal  which should be treated medically with mild to moderate disease elsewhere. 2.  Fick cardiac output of 9.7 L/min and Fick cardiac index of 4.9 L/min/m with the following hemodynamics:  Right atrial pressure mean of 15 mmHg  Right ventricular pressure 68/5 with an end-diastolic pressure of 28 mmHg  Wedge pressure mean of 27 mmHg with V waves to 30 mmHg  PA pressure 70/29 with a mean of 47 mmHg  PVR 2.1 Woods units  PA pulsatility index of 2.7 3.  LVEDP of 24 mmHg Recommendation: The patient was administered 40 mg of Lasix  in the cardiac catheterization laboratory; continue diuretic therapy and medical optimization.  The results were reviewed with Dr. Mona.      Taijon Vink T. Nana Vastine [facility]  If 7PM-7AM, please contact night-coverage [email or web address] 11/03/2023, 12:16 PM

## 2023-11-04 DIAGNOSIS — I5032 Chronic diastolic (congestive) heart failure: Secondary | ICD-10-CM | POA: Diagnosis not present

## 2023-11-04 DIAGNOSIS — I214 Non-ST elevation (NSTEMI) myocardial infarction: Secondary | ICD-10-CM | POA: Diagnosis not present

## 2023-11-04 DIAGNOSIS — I400 Infective myocarditis: Secondary | ICD-10-CM | POA: Diagnosis not present

## 2023-11-04 DIAGNOSIS — I5020 Unspecified systolic (congestive) heart failure: Secondary | ICD-10-CM | POA: Diagnosis not present

## 2023-11-04 LAB — MAGNESIUM: Magnesium: 1.7 mg/dL (ref 1.7–2.4)

## 2023-11-04 LAB — CBC
HCT: 23.6 % — ABNORMAL LOW (ref 39.0–52.0)
Hemoglobin: 7.6 g/dL — ABNORMAL LOW (ref 13.0–17.0)
MCH: 29.2 pg (ref 26.0–34.0)
MCHC: 32.2 g/dL (ref 30.0–36.0)
MCV: 90.8 fL (ref 80.0–100.0)
Platelets: 363 10*3/uL (ref 150–400)
RBC: 2.6 MIL/uL — ABNORMAL LOW (ref 4.22–5.81)
RDW: 15.8 % — ABNORMAL HIGH (ref 11.5–15.5)
WBC: 7.1 10*3/uL (ref 4.0–10.5)
nRBC: 0 % (ref 0.0–0.2)

## 2023-11-04 LAB — BASIC METABOLIC PANEL
Anion gap: 12 (ref 5–15)
BUN: 7 mg/dL — ABNORMAL LOW (ref 8–23)
CO2: 24 mmol/L (ref 22–32)
Calcium: 8.8 mg/dL — ABNORMAL LOW (ref 8.9–10.3)
Chloride: 100 mmol/L (ref 98–111)
Creatinine, Ser: 0.99 mg/dL (ref 0.61–1.24)
GFR, Estimated: >60 mL/min (ref >60)
Glucose, Bld: 89 mg/dL (ref 70–99)
Potassium: 3.9 mmol/L (ref 3.5–5.1)
Sodium: 136 mmol/L (ref 135–145)

## 2023-11-04 LAB — PREPARE RBC (CROSSMATCH)

## 2023-11-04 MED ORDER — CEFADROXIL 500 MG PO CAPS
1000.0000 mg | ORAL_CAPSULE | Freq: Two times a day (BID) | ORAL | Status: DC
  Administered 2023-11-04 – 2023-11-09 (×12): 1000 mg via ORAL
  Filled 2023-11-04 (×12): qty 2

## 2023-11-04 MED ORDER — SODIUM CHLORIDE 0.9% IV SOLUTION: Freq: Once | INTRAVENOUS | Status: AC

## 2023-11-04 NOTE — Progress Notes (Signed)
 Progress Note  Patient Name: Don [name] Date of Encounter: 11/04/2023  Primary Cardiologist: Jayson Sierras, MD  Interval Summary   Chart reviewed.  Reports no chest pain or shortness of breath at rest.  Vital Signs    Vitals:   11/03/23 2017 11/03/23 2259 11/04/23 0455 11/04/23 0747  BP: 100/72 (!) 140/55 125/64   Pulse: 84 85 81   Resp: 18 20 20    Temp: 98 F (36.7 C) 98.2 F (36.8 C) 97.9 F (36.6 C) 99.3 F (37.4 C)  TempSrc: Oral Oral Oral Oral  SpO2: 96% 93% 94%   Weight:      Height:        Intake/Output Summary (Last 24 hours) at 11/04/2023 1339 Last data filed at 11/04/2023 1221 Gross per 24 hour  Intake --  Output 801 ml  Net -801 ml   Filed Weights   10/28/23 1856 10/30/23 1725  Weight: 75 kg 74.7 kg    Physical Exam   GEN: No acute distress.  Appears frail. Neck: No JVD. Cardiac: RRR with ectopy, no murmur or gallop.  Respiratory: Nonlabored. Clear to auscultation bilaterally. GI: Soft, nontender, bowel sounds present. MS: No edema.  ECG/Telemetry    Telemetry reviewed showing sinus rhythm with occasional to frequent PACs, occasional PVCs, no NSVT.  Labs    Chemistry Recent Labs  Lab 10/28/23 1933 10/29/23 9483 10/30/23 0304 10/31/23 9566 10/31/23 0810 11/01/23 0439 11/02/23 0725 11/02/23 1531 11/02/23 1534 11/03/23 0318 11/04/23 0349  NA 136   < > 137   < > 137 140 137   < > 135 137 136  K 3.7   < > 3.9   < > 3.4* 3.8 3.6   < > 3.5 3.8 3.9  CL 101   < > 102   < > 102 107 103  --   --  99 100  CO2 26   < > 22   < > 25 24 24   --   --  24 24  GLUCOSE 98   < > 87   < > 100* 129* 94  --   --  93 89  BUN 12   < > 9   < > 7* 13 10  --   --  10 7*  CREATININE 0.90   < > 0.79   < > 0.90 0.98 0.88  --   --  0.90 0.99  CALCIUM  8.7*   < > 9.0   < > 8.9 8.9 8.8*  --   --  9.0 8.8*  PROT 6.1*  --  6.4*  --   --  5.2*  --   --   --   --   --   ALBUMIN  2.3*  --  2.3*  --  2.0* 1.7*  --   --   --   --   --   AST 33  --  27  --   --  26   --   --   --   --   --   ALT 24  --  21  --   --  20  --   --   --   --   --   ALKPHOS 122  --  126  --   --  121  --   --   --   --   --   BILITOT 0.7  --  1.1  --   --  0.6  --   --   --   --   --  GFRNONAA >60   < > >60   < > >60 >60 >60  --   --  >60 >60  ANIONGAP 9   < > 13   < > 10 9 10   --   --  14 12   < > = values in this interval not displayed.    Hematology Recent Labs  Lab 11/02/23 0725 11/02/23 1531 11/02/23 1534 11/03/23 0318 11/04/23 0349  WBC 9.4  --   --  8.1 7.1  RBC 2.56*  --   --  2.62* 2.60*  HGB 7.6*   < > 8.2* 7.7* 7.6*  HCT 23.6*   < > 24.0* 23.8* 23.6*  MCV 92.2  --   --  90.8 90.8  MCH 29.7  --   --  29.4 29.2  MCHC 32.2  --   --  32.4 32.2  RDW 15.9*  --   --  15.9* 15.8*  PLT 353  --   --  355 363   < > = values in this interval not displayed.   Cardiac Enzymes Recent Labs  Lab 10/28/23 2137 10/29/23 0516 10/29/23 0822 10/29/23 1134 10/31/23 0433  TROPONINIHS 597* 1,084* 779* 580* 336*   Lipid Panel     Component Value Date/Time   CHOL 94 10/29/2023 0516   TRIG 67 10/29/2023 0516   HDL 41 10/29/2023 0516   CHOLHDL 2.3 10/29/2023 0516   VLDL 13 10/29/2023 0516   LDLCALC 40 10/29/2023 0516    Cardiac Studies   Cardiac catheterization 11/02/2023:   Mid LAD lesion is 40% stenosed.   1st Diag lesion is 90% stenosed.   Prox Cx lesion is 40% stenosed.   1.  High-grade ostial lesion of very small diagonal which should be treated medically with mild to moderate disease elsewhere. 2.  Fick cardiac output of 9.7 L/min and Fick cardiac index of 4.9 L/min/m with the following hemodynamics:            Right atrial pressure mean of 15 mmHg            Right ventricular pressure 68/5 with an end-diastolic pressure of 28 mmHg            Wedge pressure mean of 27 mmHg with V waves to 30 mmHg            PA pressure 70/29 with a mean of 47 mmHg            PVR 2.1 Woods units            PA pulsatility index of 2.7 3.  LVEDP of 24 mmHg    Recommendation: The patient was administered 40 mg of Lasix  in the cardiac catheterization laboratory; continue diuretic therapy and medical optimization.  The results were reviewed with Dr. Mona.  Assessment & Plan   1.  Acute on chronic HFrEF, cardiac MRI on February 6 demonstrating LVEF 37%, RVEF 39%, and LGE pattern suggesting acute myocarditis or sarcoidosis.  ESR 103 and CRP 19.  Left and right heart catheterization yesterday documented above with hemodynamics showing normal output, increased filling pressures and pulmonary venous hypertension.  Seen by heart failure team on February 6 at this point plan is medical therapy, not candidate for advanced treatment modalities.  Recommendation from heart failure team was to hold off on steroids unless cardiac output was severely reduced, which is not the case.  ID service subsequently consulted by primary team and recommending steroids.  He is currently on Coreg  and  Aldactone .  2.  PSVT.  3.  History of hepatitis C with hepatic cirrhosis.  4.  Acute on chronic anemia, no obvious bleeding.  Being followed by primary team.  Hemoglobin 7.7 and relatively stable, normal MCV.  Stool guaiac negative.  5.  Mixed hyperlipidemia, on Crestor .  Per discussion with Dr. Gardenia yesterday, holding off on steroids unless ventricular ectopy becomes very frequent.  He is on Coreg  and Aldactone .  Palliative care consultation noted yesterday.  Continue with current regimen.  For questions or updates, please contact [facility] Please consult [email or web address] for contact info under   Signed, Jayson Sierras, MD  11/04/2023, 1:39 PM

## 2023-11-04 NOTE — TOC Progression Note (Signed)
 Transition of Care [facility]) - Progression Note    Patient Details  Name: Don [name] MRN: [id number] Date of Birth: 1946-01-10  Transition of Care [facility]) CM/SW Contact  Olam FORBES Ally, LCSW Phone Number: 11/04/2023, 2:29 PM  Clinical Narrative:     Pt's shara is still pending.  TOC team will continue to assist with discharge planning needs.    Expected Discharge Plan: Skilled Nursing Facility Barriers to Discharge: Continued Medical Work up  Expected Discharge Plan and Services In-house Referral: Clinical Social Work     Living arrangements for the past 2 months: Skilled Nursing Facility                                       Social Determinants of Health (SDOH) Interventions SDOH Screenings   Food Insecurity: Patient Unable To Answer (10/30/2023)  Housing: Low Risk  (10/14/2023)  Transportation Needs: No Transportation Needs (10/14/2023)  Utilities: Not At Risk (10/14/2023)  Social Connections: Unknown (10/31/2023)  Tobacco Use: Medium Risk (10/28/2023)    Readmission Risk Interventions    10/29/2023   10:25 AM 10/14/2023   11:02 AM  Readmission Risk Prevention Plan  Transportation Screening Complete Complete  HRI or Home Care Consult  Complete  Social Work Consult for Recovery Care Planning/Counseling  Complete  Palliative Care Screening  Not Applicable  Medication Review Oceanographer) Complete Complete  HRI or Home Care Consult Complete   SW Recovery Care/Counseling Consult Complete   Palliative Care Screening Not Applicable   Skilled Nursing Facility Complete

## 2023-11-04 NOTE — Plan of Care (Signed)
 Will continue to monitor patien

## 2023-11-04 NOTE — Progress Notes (Signed)
 PROGRESS NOTE  Don [name] FMW:[id number] DOB: 1945-10-02   PCP: [facility]  Patient is from: SNF.  Nonambulatory at baseline.  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    In the ED, temp 101.8 F, HR 160, RR 20, BP 175/105, SpO2 95% on room air.  WBC 9.7, hemoglobin 8.3, platelet count 381.  CMP without significant finding.  Trop 77>597.  EKG notable for supraventricular tachycardia, rate 158. Lactic acid 1.2.  COVID/influenza/RSV PCR negative.  UA and CXR without significant finding. Blood cultures x 2 obtained.  Started on broad-spectrum antibiotics.  Cardiology consulted.  Admission requested for acute myocarditis.  Patient was evaluated by cardiology who recommended transfer to [facility] for further cardiac evaluation  SVT and fever resolved.  RVP and blood culture negative.  Antibiotics discontinued.  Elevated inflammatory markers, intermittent fever and acute on chronic anemia without explanation.   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  Other differential diagnosis including sarcoidosis, and a small pericardial effusion.  Patient underwent R/LHC on 2/7 that showed mild LAD and proximal LCx lesion and high-grade ostial lesion of very small diagonal.  No stent placement.  Cardiology recommended medical management.  Patient continued to spike intermittent fever with markedly elevated inflammatory markers.  No clear source of infection.  ID consulted but did not feel there is active infection and signed off.  Palliative medicine consulted and following.   Subjective: Seen and examined earlier this morning.  No major events overnight of this  morning.  No complaints but not a great historian.  He denies pain, shortness of breath, GI or UTI symptoms.  He is awake and alert and oriented x 4 except to month and year.  Discussed about anemia and blood transfusion.  He is in agreement with blood transfusion.  Objective: Vitals:   11/03/23 2017 11/03/23 2259 11/04/23 0455 11/04/23 0747  BP: 100/72 (!) 140/55 125/64   Pulse: 84 85 81   Resp: 18 20 20    Temp: 98 F (36.7 C) 98.2 F (36.8 C) 97.9 F (36.6 C) 99.3 F (37.4 C)  TempSrc: Oral Oral Oral Oral  SpO2: 96% 93% 94%   Weight:      Height:        Examination:  GENERAL: No apparent distress.  Appears frail. HEENT: MMM.  Vision and hearing grossly intact.  NECK: Supple.  No apparent JVD.  RESP:  No IWOB.  Fair aeration bilaterally. CVS:  RRR. Heart sounds normal.  ABD/GI/GU: BS+. Abd soft, NTND.  MSK/EXT:  Moves extremities.  Some swelling in right forearm with increased warmth to touch but no erythema.  No tenderness.  SKIN: no apparent skin lesion or wound NEURO: Awake, alert and oriented appropriately.  No apparent focal neuro deficit. PSYCH: Somewhat withdrawn with flat affect.  Procedures:  12/7-R/LHC on 2/7 that showed mild LAD and proximal LCx lesion and high-grade ostial lesion of very small diagonal.  Microbiology summarized: COVID-19, influenza and RSV PCR nonreactive A 20 pathogen RVP nonreactive Blood cultures negative  Assessment and plan: Acute myocarditis/suspected non-STEMI: Presented with acute substernal chest pain and fever.  Troponin 77>>> 1600>> 336 suggesting ACS.  Limited  TTE without significant finding.  Infectious workup unrevealing.  Pro-Cal negative.  CRP elevated to 15>> 18.  ESR elevated to 90>> 104.  ANA normal.  Cardiac MRI concerning for acute myocarditis.  R/LHC as above. -Cardiology-no plan for steroid.  Medical management for CAD and palliative consult.   -Follow SPEP. -Continue low-dose aspirin    Supraventricular tachycardia: HR  elevated to 160s on arrival.  History of BB intolerance.  Resolved.  -Continue Coreg .  Tolerating. -Optimize electrolytes. -Continue telemetry   Intermittent fever: Unclear source.  Infectious workup including CXR, UA, cultures and viral panel unrevealing.  Antibiotics discontinued. Inflammatory markers markedly elevated.  MRI suggestive of acute myocarditis.  Left foot and right elbow x-ray without significant finding.  Low suspicion for active infection per ID.  ID signed off.  Chronic HFimpEF: Limited TTE with LVEF of 55 to 60% (previously 20 to 25%)., no RWMA.  BNP elevated to 1600.  Appears euvolemic on exam.  Currently without cardiopulmonary symptoms.  Not on diuretics.  Cardiac MRI and R/LHC as above. -Continue Coreg  as above -Did not tolerate GDMT in the past due to orthostatic hypotension. -Strict intake and output, daily weight and renal functions  History of gout without acute flareup: While he is unusually high dose colchicine .  He is also on a statin which could increase his risk of rhabdo.  CK and uric acid not elevated. -Decreased colchicine  to 0.6 mg daily as needed. -Continue home allopurinol .  Acute on chronic anemia: Although Hgb dropped about 2 g in the last 24 hours, baseline ~8.0.  No overt bleeding.  Hemoccult negative.  Normal LDH and slightly elevated haptoglobin argues against hemolysis.  Blood smear normal.  Inflammatory markers markedly elevated.  H&H relatively stable but will benefit from 1 unit given history of CAD Recent Labs    10/31/23 0433 10/31/23 0810 11/01/23 0439 11/01/23 1000 11/01/23 1415 11/02/23 0725 11/02/23 1531 11/02/23 1534 11/03/23 0318 11/04/23 0349  HGB 8.7* 9.6* 7.7* 7.6* 8.1* 7.6* 8.2*  7.8* 8.2* 7.7* 7.6*  -Transfuse 1 unit.  Verbally consented for transfusion -Monitor H&H.  Goal Hgb > 8.0 given CAD.  Significantly elevated CRP and ESR with intermittent fever: Due to acute myocarditis?  Has RUE swelling with increased warmth to  touch but no erythema or tenderness.  Right elbow x-ray unrevealing.  No leukocytosis.  Last fever on 2/6 but mild temp to 99 degrees -Start p.o. cefadroxil  for possible RUE cellulitis  History of liver cirrhosis: Compensated.  Euthyroid sick syndrome: Mild elevated TSH with normal free T4. -Repeat in 4 to 6 weeks  CKD 3A ruled out.  Hyperlipidemia -Continue Crestor  5 mg p.o. daily  History of hepatitis C Outpatient follow-up with gastroenterology/infectious disease.  Bilateral leg pain: Exam reassuring except for left foot tenderness.  X-ray negative.  No signs of infection.  Resolved. -Tylenol  as needed -Voltaren  gel  Ambulatory dysfunction: Patient reports using chairs with wheels to get around.  -PT/OT eval   GERD -Protonix  40 mg p.o. daily  Constipation: Resolved -Continue bowel regimen as needed  Physical deconditioning: Too deconditioned and frail.  Poor overall prognosis.  Still full code. -Palliative consulted.  Follow recommendations   Body mass index is 22.34 kg/m.           DVT prophylaxis:  enoxaparin  (LOVENOX ) injection 40 mg Start: 11/03/23 1600 Place and maintain sequential compression device Start: 11/01/23 1234  Code Status: Full code Family Communication: None at bedside. Level of care: Progressive Status is: Inpatient Remains inpatient appropriate because: Non-STEMI/elevated  troponin/myocarditis   Final disposition: Likely back to SNF Consultants:  Cardiology Infectious disease Palliative medicine  55 minutes with more than 50% spent in reviewing records, counseling patient/family and coordinating care.   Sch Meds:  Scheduled Meds:  sodium chloride    Intravenous Once   allopurinol   150 mg Oral Daily   ascorbic acid   500 mg Oral Q breakfast   aspirin  EC  81 mg Oral Daily   calcitRIOL   0.25 mcg Oral Daily   carvedilol   6.25 mg Oral BID WC   cefadroxil   1,000 mg Oral BID   cholecalciferol   1,000 Units Oral Daily   cyanocobalamin    1,000 mcg Intramuscular Once per day on Monday Friday   diclofenac  Sodium  2 g Topical QID   enoxaparin  (LOVENOX ) injection  40 mg Subcutaneous Q24H   feeding supplement  237 mL Oral BID BM   ferrous sulfate   325 mg Oral Q breakfast   multivitamin with minerals  1 tablet Oral Daily   pantoprazole   40 mg Oral Daily   rosuvastatin   5 mg Oral Daily   sodium chloride  flush  3 mL Intravenous Q12H   spironolactone   12.5 mg Oral Daily   umeclidinium bromide   1 puff Inhalation Daily   Continuous Infusions:   PRN Meds:.acetaminophen , albuterol , colchicine , hydrALAZINE , nitroGLYCERIN , ondansetron  **OR** ondansetron  (ZOFRAN ) IV, phenol, [EXPIRED] polyethylene glycol **FOLLOWED BY** polyethylene glycol, [COMPLETED] senna-docusate **FOLLOWED BY** senna-docusate, sodium chloride  flush, traZODone   Antimicrobials: Anti-infectives (From admission, onward)    Start     Dose/Rate Route Frequency Ordered Stop   11/04/23 1000  cefadroxil  (DURICEF) capsule 1,000 mg        1,000 mg Oral 2 times daily 11/04/23 0743 11/11/23 0959   10/28/23 1930  vancomycin  (VANCOCIN ) IVPB 1000 mg/200 mL premix        1,000 mg 200 mL/hr over 60 Minutes Intravenous  Once 10/28/23 1922 10/28/23 2126   10/28/23 1930  ceFEPIme  (MAXIPIME ) 2 g in sodium chloride  0.9 % 100 mL IVPB        2 g 200 mL/hr over 30 Minutes Intravenous  Once 10/28/23 1922 10/28/23 2013        I have personally reviewed the following labs and images: CBC: Recent Labs  Lab 10/28/23 1933 10/29/23 0516 11/01/23 0439 11/01/23 1000 11/01/23 1415 11/02/23 0725 11/02/23 1531 11/02/23 1534 11/03/23 0318 11/04/23 0349  WBC 9.7   < > 8.5  --  9.0 9.4  --   --  8.1 7.1  NEUTROABS 7.6  --   --   --  7.1  --   --   --   --   --   HGB 8.3*   < > 7.7*   < > 8.1* 7.6* 8.2*  7.8* 8.2* 7.7* 7.6*  HCT 25.8*   < > 23.1*   < > 25.2* 23.6* 24.0*  23.0* 24.0* 23.8* 23.6*  MCV 94.9   < > 90.2  --  93.7 92.2  --   --  90.8 90.8  PLT 381   < > 336  --  375 353   --   --  355 363   < > = values in this interval not displayed.   BMP &GFR Recent Labs  Lab 10/31/23 0433 10/31/23 0810 11/01/23 0439 11/02/23 0725 11/02/23 1531 11/02/23 1534 11/03/23 0318 11/04/23 0349  NA 137 137 140 137 138  139 135 137 136  K 3.6 3.4* 3.8 3.6 3.8  3.8 3.5 3.8 3.9  CL 102 102 107  103  --   --  99 100  CO2 25 25 24 24   --   --  24 24  GLUCOSE 93 100* 129* 94  --   --  93 89  BUN 6* 7* 13 10  --   --  10 7*  CREATININE 0.88 0.90 0.98 0.88  --   --  0.90 0.99  CALCIUM  8.9 8.9 8.9 8.8*  --   --  9.0 8.8*  MG 1.9 1.8 1.8  --   --   --  1.5* 1.7  PHOS  --  3.6  --   --   --   --   --   --    Estimated Creatinine Clearance: 66 mL/min (by C-G formula based on SCr of 0.99 mg/dL). Liver & Pancreas: Recent Labs  Lab 10/28/23 1933 10/30/23 0304 10/31/23 0810 11/01/23 0439  AST 33 27  --  26  ALT 24 21  --  20  ALKPHOS 122 126  --  121  BILITOT 0.7 1.1  --  0.6  PROT 6.1* 6.4*  --  5.2*  ALBUMIN  2.3* 2.3* 2.0* 1.7*   No results for input(s): LIPASE, AMYLASE in the last 168 hours. No results for input(s): AMMONIA in the last 168 hours. Diabetic: No results for input(s): HGBA1C in the last 72 hours. No results for input(s): GLUCAP in the last 168 hours. Cardiac Enzymes: Recent Labs  Lab 10/31/23 0809 11/01/23 0439  CKTOTAL 33* 23*   No results for input(s): PROBNP in the last 8760 hours. Coagulation Profile: Recent Labs  Lab 10/28/23 1933  INR 1.3*   Thyroid  Function Tests: No results for input(s): TSH, T4TOTAL, FREET4, T3FREE, THYROIDAB in the last 72 hours.  Lipid Profile: No results for input(s): CHOL, HDL, LDLCALC, TRIG, CHOLHDL, LDLDIRECT in the last 72 hours.  Anemia Panel: No results for input(s): VITAMINB12, FOLATE, FERRITIN, TIBC, IRON, RETICCTPCT in the last 72 hours.  Urine analysis:    Component Value Date/Time   COLORURINE AMBER (A) 10/28/2023 1922   APPEARANCEUR CLEAR  10/28/2023 1922   LABSPEC 1.018 10/28/2023 1922   PHURINE 6.0 10/28/2023 1922   GLUCOSEU NEGATIVE 10/28/2023 1922   HGBUR NEGATIVE 10/28/2023 1922   BILIRUBINUR NEGATIVE 10/28/2023 1922   KETONESUR NEGATIVE 10/28/2023 1922   PROTEINUR NEGATIVE 10/28/2023 1922   UROBILINOGEN 1.0 02/01/2012 1300   NITRITE NEGATIVE 10/28/2023 1922   LEUKOCYTESUR NEGATIVE 10/28/2023 1922   Sepsis Labs: Invalid input(s): PROCALCITONIN, LACTICIDVEN  Microbiology: Recent Results (from the past 240 hours)  Resp panel by RT-PCR (RSV, Flu A&B, Covid) Anterior Nasal Swab     Status: None   Collection Time: 10/28/23  7:22 PM   Specimen: Anterior Nasal Swab  Result Value Ref Range Status   SARS Coronavirus 2 by RT PCR NEGATIVE NEGATIVE Final    Comment: (NOTE) SARS-CoV-2 target nucleic acids are NOT DETECTED.  The SARS-CoV-2 RNA is generally detectable in upper respiratory specimens during the acute phase of infection. The lowest concentration of SARS-CoV-2 viral copies this assay can detect is 138 copies/mL. A negative result does not preclude SARS-Cov-2 infection and should not be used as the sole basis for treatment or other patient management decisions. A negative result may occur with  improper specimen collection/handling, submission of specimen other than nasopharyngeal swab, presence of viral mutation(s) within the areas targeted by this assay, and inadequate number of viral copies(<138 copies/mL). A negative result must be combined with clinical observations, patient history, and epidemiological information. The expected result  is Negative.  Fact Sheet for Patients:  bloggercourse.com  Fact Sheet for Healthcare Providers:  seriousbroker.it  This test is no t yet approved or cleared by the [place]  FDA and  has been authorized for detection and/or diagnosis of SARS-CoV-2 by FDA under an Emergency Use Authorization (EUA). This EUA will  remain  in effect (meaning this test can be used) for the duration of the COVID-19 declaration under Section 564(b)(1) of the Act, 21 U.S.C.section 360bbb-3(b)(1), unless the authorization is terminated  or revoked sooner.       Influenza A by PCR NEGATIVE NEGATIVE Final   Influenza B by PCR NEGATIVE NEGATIVE Final    Comment: (NOTE) The Xpert Xpress SARS-CoV-2/FLU/RSV plus assay is intended as an aid in the diagnosis of influenza from Nasopharyngeal swab specimens and should not be used as a sole basis for treatment. Nasal washings and aspirates are unacceptable for Xpert Xpress SARS-CoV-2/FLU/RSV testing.  Fact Sheet for Patients: bloggercourse.com  Fact Sheet for Healthcare Providers: seriousbroker.it  This test is not yet approved or cleared by the [place]  FDA and has been authorized for detection and/or diagnosis of SARS-CoV-2 by FDA under an Emergency Use Authorization (EUA). This EUA will remain in effect (meaning this test can be used) for the duration of the COVID-19 declaration under Section 564(b)(1) of the Act, 21 U.S.C. section 360bbb-3(b)(1), unless the authorization is terminated or revoked.     Resp Syncytial Virus by PCR NEGATIVE NEGATIVE Final    Comment: (NOTE) Fact Sheet for Patients: bloggercourse.com  Fact Sheet for Healthcare Providers: seriousbroker.it  This test is not yet approved or cleared by the [place]  FDA and has been authorized for detection and/or diagnosis of SARS-CoV-2 by FDA under an Emergency Use Authorization (EUA). This EUA will remain in effect (meaning this test can be used) for the duration of the COVID-19 declaration under Section 564(b)(1) of the Act, 21 U.S.C. section 360bbb-3(b)(1), unless the authorization is terminated or revoked.  Performed at [facility], [address], [place], [place] [address]   Blood  Culture (routine x 2)     Status: None   Collection Time: 10/28/23  7:33 PM   Specimen: Left Antecubital; Blood  Result Value Ref Range Status   Specimen Description LEFT ANTECUBITAL BLOOD  Final   Special Requests   Final    BOTTLES DRAWN AEROBIC AND ANAEROBIC Blood Culture adequate volume   Culture   Final    NO GROWTH 5 DAYS Performed at [facility], [address] [place], [place] [address]    Report Status 11/02/2023 FINAL  Final  Blood Culture (routine x 2)     Status: None   Collection Time: 10/28/23  7:43 PM   Specimen: BLOOD RIGHT FOREARM  Result Value Ref Range Status   Specimen Description BLOOD RIGHT FOREARM BLOOD  Final   Special Requests   Final    BOTTLES DRAWN AEROBIC AND ANAEROBIC Blood Culture adequate volume   Culture   Final    NO GROWTH 5 DAYS Performed at [facility], [address], [place], [place] [address]    Report Status 11/02/2023 FINAL  Final  Respiratory (~20 pathogens) panel by PCR     Status: None   Collection Time: 10/29/23  5:20 PM   Specimen: Nasopharyngeal Swab; Respiratory  Result Value Ref Range Status   Adenovirus NOT DETECTED NOT DETECTED Final   Coronavirus 229E NOT DETECTED NOT DETECTED Final    Comment: (NOTE) The Coronavirus on the Respiratory Panel, DOES  NOT test for the novel  Coronavirus (2019 nCoV)    Coronavirus HKU1 NOT DETECTED NOT DETECTED Final   Coronavirus NL63 NOT DETECTED NOT DETECTED Final   Coronavirus OC43 NOT DETECTED NOT DETECTED Final   Metapneumovirus NOT DETECTED NOT DETECTED Final   Rhinovirus / Enterovirus NOT DETECTED NOT DETECTED Final   Influenza A NOT DETECTED NOT DETECTED Final   Influenza B NOT DETECTED NOT DETECTED Final   Parainfluenza Virus 1 NOT DETECTED NOT DETECTED Final   Parainfluenza Virus 2 NOT DETECTED NOT DETECTED Final   Parainfluenza Virus 3 NOT DETECTED NOT DETECTED Final   Parainfluenza Virus 4 NOT DETECTED NOT DETECTED Final   Respiratory Syncytial Virus NOT DETECTED NOT DETECTED  Final   Bordetella pertussis NOT DETECTED NOT DETECTED Final   Bordetella Parapertussis NOT DETECTED NOT DETECTED Final   Chlamydophila pneumoniae NOT DETECTED NOT DETECTED Final   Mycoplasma pneumoniae NOT DETECTED NOT DETECTED Final    Comment: Performed at [facility], [address] [place], [place] [address]    Radiology Studies: No results found.     Sayla Golonka T. Vasilios [facility]  If 7PM-7AM, please contact night-coverage [email or web address] 11/04/2023, 11:09 AM

## 2023-11-05 DIAGNOSIS — I214 Non-ST elevation (NSTEMI) myocardial infarction: Secondary | ICD-10-CM | POA: Diagnosis not present

## 2023-11-05 DIAGNOSIS — I5032 Chronic diastolic (congestive) heart failure: Secondary | ICD-10-CM | POA: Diagnosis not present

## 2023-11-05 DIAGNOSIS — Z7189 Other specified counseling: Secondary | ICD-10-CM | POA: Diagnosis not present

## 2023-11-05 DIAGNOSIS — I5020 Unspecified systolic (congestive) heart failure: Secondary | ICD-10-CM | POA: Diagnosis not present

## 2023-11-05 DIAGNOSIS — Z515 Encounter for palliative care: Secondary | ICD-10-CM | POA: Diagnosis not present

## 2023-11-05 DIAGNOSIS — I409 Acute myocarditis, unspecified: Secondary | ICD-10-CM | POA: Diagnosis not present

## 2023-11-05 DIAGNOSIS — I400 Infective myocarditis: Secondary | ICD-10-CM | POA: Diagnosis not present

## 2023-11-05 LAB — BASIC METABOLIC PANEL
Anion gap: 13 (ref 5–15)
BUN: 8 mg/dL (ref 8–23)
CO2: 23 mmol/L (ref 22–32)
Calcium: 9.3 mg/dL (ref 8.9–10.3)
Chloride: 100 mmol/L (ref 98–111)
Creatinine, Ser: 0.98 mg/dL (ref 0.61–1.24)
GFR, Estimated: >60 mL/min (ref >60)
Glucose, Bld: 93 mg/dL (ref 70–99)
Potassium: 3.8 mmol/L (ref 3.5–5.1)
Sodium: 136 mmol/L (ref 135–145)

## 2023-11-05 LAB — CBC
HCT: 27.7 % — ABNORMAL LOW (ref 39.0–52.0)
Hemoglobin: 9.1 g/dL — ABNORMAL LOW (ref 13.0–17.0)
MCH: 29.6 pg (ref 26.0–34.0)
MCHC: 32.9 g/dL (ref 30.0–36.0)
MCV: 90.2 fL (ref 80.0–100.0)
Platelets: 357 10*3/uL (ref 150–400)
RBC: 3.07 MIL/uL — ABNORMAL LOW (ref 4.22–5.81)
RDW: 15.5 % (ref 11.5–15.5)
WBC: 5.9 10*3/uL (ref 4.0–10.5)
nRBC: 0 % (ref 0.0–0.2)

## 2023-11-05 LAB — TYPE AND SCREEN
ABO/RH(D): A POS
Antibody Screen: NEGATIVE
Unit division: 0

## 2023-11-05 LAB — MAGNESIUM: Magnesium: 1.6 mg/dL — ABNORMAL LOW (ref 1.7–2.4)

## 2023-11-05 LAB — BPAM RBC
Blood Product Expiration Date: 202503032359
ISSUE DATE / TIME: 202502091648
Unit Type and Rh: 6200

## 2023-11-05 MED ORDER — MAGNESIUM SULFATE 4 GM/100ML IV SOLN
4.0000 g | Freq: Once | INTRAVENOUS | Status: AC
  Administered 2023-11-05: 4 g via INTRAVENOUS
  Filled 2023-11-05: qty 100

## 2023-11-05 MED ORDER — GUAIFENESIN-DM 100-10 MG/5ML PO SYRP
5.0000 mL | ORAL_SOLUTION | ORAL | Status: DC | PRN
  Administered 2023-11-05 – 2023-11-07 (×5): 5 mL via ORAL
  Filled 2023-11-05 (×5): qty 5

## 2023-11-05 NOTE — Plan of Care (Signed)
 Will continue to monitor patient.

## 2023-11-05 NOTE — TOC Progression Note (Addendum)
 Transition of Care [facility]) - Progression Note    Patient Details  Name: Don [name] MRN: [id number] Date of Birth: [birth year]  [facility] CM/SW Contact  Carmon Christen, LCSWA Phone Number: 11/05/2023, 12:24 PM  Clinical Narrative:     CSW called patients insurance to check on status of insurance authorization. Patients insurance confirmed, patients Insurance authorization currently pending for SNF. CSW will continue to follow.  CSW received call from patients insurance that patients insurance auth went to peer to peer and that peer to peer was denied for SNF. Patients insurance authorization informed CSW that patient can appeal for SNF and provided appeal information.Tele# [phone]. Fax# [phone].   Update- CSW updated patient at bedside. Patient agreeable for CSW to help his sister diane start appeal for SNF. CSW and patients sister called appeal number and started expedited appeal for patient. Patients insurance requested clinicals for review. CSW faxed over requested clinicals for insurance to review. Patients insurance informed CSW insurance appeal could take up to 72 hours for determination. CSW will continue to follow.   Expected Discharge Plan: Skilled Nursing Facility Barriers to Discharge: Continued Medical Work up  Expected Discharge Plan and Services In-house Referral: Clinical Social Work     Living arrangements for the past 2 months: Skilled Nursing Facility                                       Social Determinants of Health (SDOH) Interventions SDOH Screenings   Food Insecurity: Patient Unable To Answer (10/30/2023)  Housing: Low Risk  (10/14/2023)  Transportation Needs: No Transportation Needs (10/14/2023)  Utilities: Not At Risk (10/14/2023)  Social Connections: Unknown (10/31/2023)  Tobacco Use: Medium Risk (10/28/2023)    Readmission Risk Interventions    10/29/2023   10:25 AM 10/14/2023   11:02 AM  Readmission Risk Prevention  Plan  Transportation Screening Complete Complete  HRI or Home Care Consult  Complete  Social Work Consult for Recovery Care Planning/Counseling  Complete  Palliative Care Screening  Not Applicable  Medication Review Oceanographer) Complete Complete  HRI or Home Care Consult Complete   SW Recovery Care/Counseling Consult Complete   Palliative Care Screening Not Applicable   Skilled Nursing Facility Complete

## 2023-11-05 NOTE — Progress Notes (Addendum)
Palliative:  HPI: 78 y.o. 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 Other differential diagnosis including sarcoidosis, and a small pericardial effusion.  Patient also with ongoing intermittent fever due to likely cellulitis.  PMT consulted to discuss goals of care.   I met today with Don [name]. He is awake and conversant today. He is able to follow commands. Able to lift legs off bed with relative ease. Poor insight into his overall health but has some recall. Noted that he was DNR during last admission with documentation on 10/14/23 that Don [name] confirmed DNR/DNI status. I discussed with Don [name] who does not recall any discussions regarding code status and his wishes. I reviewed with him DNR/DNI and the risks vs benefits of resuscitation and expected poor outcomes. He expresses to me that he does not know what he would want. He agrees to spend some time thinking about this and we will discuss again tomorrow what he thinks.   Overall he reports that he is feeling well other than a dry cough and feeling more weak. Chloraseptic spray at bedside and he reports that this helps. He agrees to transition to rehab with hopes of returning home with his sister.   All questions/concerns addressed. Emotional support provided.   Exam: Alert, awake. Mostly oriented. Will test recall when I follow up tomorrow. Poor insight into overall health challenges. No distress. Breathing regular, unlabored. Abd soft. Moves all extremities. RUE edema but no redness, warmth. No edema in lower extremities.   Plan: - Follow up tomorrow to continue code status discussion with patient. Noted he was DNR/DNI on past admission.   35 min  Yong Channel, NP Palliative Medicine Team Pager [phone] (Please see [email or web address] for  schedule) Team Phone [phone]

## 2023-11-05 NOTE — Progress Notes (Signed)
 PROGRESS NOTE  KEILAND CURRENT OZD:[id number] DOB: 17-Jan-1946   PCP: Bryna Car [facility]  Patient is from: SNF.  Nonambulatory at baseline.  DOA: 10/28/2023 LOS: 8  Chief complaints Chief Complaint  Patient presents with   Chest Pain     Brief Narrative / Interim history: 78 year old M with PMH of HFimpEF, liver cirrhosis, hepatitis C, hypotension on midodrine , anemia, HTN, HLD and gout brought to [facility] from [facility] on 2/2 due to acute onset substernal chest pain and fever.    In the ED, temp 101.8 F, HR 160, RR 20, BP 175/105, SpO2 95% on room air.  WBC 9.7, hemoglobin 8.3, platelet count 381.  CMP without significant finding.  Trop 77>597.  EKG notable for supraventricular tachycardia, rate 158. Lactic acid 1.2.  COVID/influenza/RSV PCR negative.  UA and CXR without significant finding. Blood cultures x 2 obtained.  Started on broad-spectrum antibiotics.  Cardiology consulted.  Admission requested for acute myocarditis.  Patient was evaluated by cardiology who recommended transfer to [facility] for further cardiac evaluation  SVT and fever resolved.  RVP and blood culture negative.  Antibiotics discontinued.  Elevated inflammatory markers, intermittent fever and acute on chronic anemia without explanation.   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  Other differential diagnosis including sarcoidosis, and a small pericardial effusion.  Patient underwent R/LHC on 2/7 that showed mild LAD and proximal LCx lesion and high-grade ostial lesion of very small diagonal.  No stent placement.  Cardiology recommended medical management.  Patient continued to spike intermittent fever with markedly elevated inflammatory markers.  No clear source of infection.  ID consulted but did not feel there is active infection and signed off.  Started on p.o. cefadroxil  due to concern for RUE cellulitis.  Palliative medicine and cardiology following.   Subjective: Seen and  examined earlier this morning.  No major events overnight of this morning.  Mild temp to 100 F yesterday.  No complaints other than some intermittent cough.  Cough is dry.  Denies chest pain, shortness of breath, GI or UTI symptoms.  Objective: Vitals:   11/04/23 1942 11/05/23 0319 11/05/23 0700 11/05/23 0830  BP: 124/72 122/63    Pulse: 84 74  70  Resp: 17 20  (!) 21  Temp: 99.6 F (37.6 C) 100 F (37.8 C) 98.6 F (37 C)   TempSrc: Oral Oral Oral   SpO2: 95%     Weight:      Height:        Examination:  GENERAL: No apparent distress.  Appears frail. HEENT: MMM.  Vision and hearing grossly intact.  NECK: Supple.  No apparent JVD.  RESP:  No IWOB.  Fair aeration bilaterally. CVS:  RRR. Heart sounds normal.  ABD/GI/GU: BS+. Abd soft, NTND.  MSK/EXT:  Moves extremities.  Some swelling in right forearm with increased warmth to touch but no erythema.  No tenderness.  SKIN: no apparent skin lesion or wound NEURO: Awake, alert and oriented appropriately.  No apparent focal neuro deficit. PSYCH: Somewhat withdrawn with flat affect.  Procedures:  12/7-R/LHC on 2/7 that showed mild LAD and proximal LCx lesion and high-grade ostial lesion of very small diagonal.  Microbiology summarized: COVID-19, influenza and RSV PCR nonreactive A 20 pathogen RVP nonreactive Blood cultures negative  Assessment and plan: Acute myocarditis/suspected non-STEMI: Presented with acute substernal chest pain and fever.  Troponin 77>>> 1600>> 336 suggesting ACS.  Limited TTE without significant finding.  Infectious workup unrevealing.  Pro-Cal negative.  CRP elevated to 15>> 18.  ESR elevated to 90>> 104.  ANA normal.  Cardiac MRI concerning for acute myocarditis.  R/LHC as above. -Cardiology-no plan for steroid.  Medical management for CAD and palliative consult.   -Follow SPEP. -Continue low-dose aspirin    Supraventricular tachycardia: HR elevated to 160s on arrival.  History of BB intolerance.   Resolved.  -Continue Coreg .  Tolerating. -Optimize electrolytes. -Continue telemetry   Intermittent fever/RUE cellulitis/markedly elevated inflammatory markers: Inflammatory markers markedly elevated. Infectious workup including CXR, UA, cultures, viral panel, left foot and right elbow x-ray unrevealing.  MRI suggestive of acute myocarditis.  ID consulted but signed off.  However, noted RUE swelling with increased warmth to touch concerning for cellulitis. -Started p.o. cefadroxil  on 2/9.  Chronic HFimpEF: Limited TTE with LVEF of 55 to 60% (previously 20 to 25%)., no RWMA.  BNP elevated to 1600.  Appears euvolemic on exam.   Not on diuretics.  Cardiac MRI and R/LHC as above. -Continue Coreg  as above -Did not tolerate GDMT in the past due to orthostatic hypotension. -Strict intake and output, daily weight and renal functions  History of gout without acute flareup: While he is unusually high dose colchicine .  He is also on a statin which could increase his risk of rhabdo.  CK and uric acid not elevated. -Decreased colchicine  to 0.6 mg daily as needed. -Continue home allopurinol .  Acute on chronic anemia: Although Hgb dropped about 2 g in the last 24 hours, baseline ~8.0.  No overt bleeding.  Hemoccult negative.  Normal LDH and slightly elevated haptoglobin argues against hemolysis.  Blood smear normal.  Inflammatory markers markedly elevated.  H&H relatively stable but will benefit from 1 unit given history of CAD Recent Labs    10/31/23 0810 11/01/23 0439 11/01/23 1000 11/01/23 1415 11/02/23 0725 11/02/23 1531 11/02/23 1534 11/03/23 0318 11/04/23 0349 11/05/23 0408  HGB 9.6* 7.7* 7.6* 8.1* 7.6* 8.2*  7.8* 8.2* 7.7* 7.6* 9.1*  -Transfuse 1 unit.  Verbally consented for transfusion -Monitor H&H.  Goal Hgb > 8.0 given CAD.  History of liver cirrhosis/history of hepatitis C: Compensated.  Not sure if he has been treated for hep C. -Recheck hep C antibody with reflex PCR  Euthyroid  sick syndrome: Mild elevated TSH with normal free T4. -Repeat in 4 to 6 weeks  CKD 3A ruled out.  Hyperlipidemia -Continue Crestor  5 mg p.o. daily  Bilateral leg pain: Exam reassuring except for left foot tenderness.  X-ray negative.  No signs of infection.  Resolved. -Tylenol  as needed -Voltaren  gel  Ambulatory dysfunction: Patient reports using chairs with wheels to get around.  -PT/OT eval  Hypomagnesemia -Monitor replenish as appropriate   GERD -Protonix  40 mg p.o. daily  Constipation: Resolved -Continue bowel regimen as needed  Physical deconditioning: Too deconditioned and frail.  Poor overall prognosis.  Still full code. -Palliative consulted.  Follow recommendations   Body mass index is 22.34 kg/m.           DVT prophylaxis:  enoxaparin  (LOVENOX ) injection 40 mg Start: 11/03/23 1600 Place and maintain sequential compression device Start: 11/01/23 1234  Code Status: Full code Family Communication: None at bedside. Level of care: Telemetry Cardiac Status is: Inpatient Remains inpatient appropriate because: Non-STEMI/elevated troponin/myocarditis/cellulitis   Final disposition: Likely back to SNF Consultants:  Cardiology Infectious disease Palliative medicine  55 minutes with more than 50% spent in reviewing records, counseling patient/family and coordinating care.   Sch Meds:  Scheduled Meds:  allopurinol   150 mg Oral Daily  ascorbic acid   500 mg Oral Q breakfast   aspirin  EC  81 mg Oral Daily   calcitRIOL   0.25 mcg Oral Daily   carvedilol   6.25 mg Oral BID WC   cefadroxil   1,000 mg Oral BID   cholecalciferol   1,000 Units Oral Daily   cyanocobalamin   1,000 mcg Intramuscular Once per day on Monday Friday   diclofenac  Sodium  2 g Topical QID   enoxaparin  (LOVENOX ) injection  40 mg Subcutaneous Q24H   feeding supplement  237 mL Oral BID BM   ferrous sulfate   325 mg Oral Q breakfast   multivitamin with minerals  1 tablet Oral Daily    pantoprazole   40 mg Oral Daily   rosuvastatin   5 mg Oral Daily   sodium chloride  flush  3 mL Intravenous Q12H   spironolactone   12.5 mg Oral Daily   umeclidinium bromide   1 puff Inhalation Daily   Continuous Infusions:   PRN Meds:.acetaminophen , albuterol , colchicine , guaiFENesin -dextromethorphan , hydrALAZINE , nitroGLYCERIN , ondansetron  **OR** ondansetron  (ZOFRAN ) IV, phenol, [EXPIRED] polyethylene glycol **FOLLOWED BY** polyethylene glycol, [COMPLETED] senna-docusate **FOLLOWED BY** senna-docusate, sodium chloride  flush, traZODone   Antimicrobials: Anti-infectives (From admission, onward)    Start     Dose/Rate Route Frequency Ordered Stop   11/04/23 1000  cefadroxil  (DURICEF) capsule 1,000 mg        1,000 mg Oral 2 times daily 11/04/23 0743 11/11/23 0959   10/28/23 1930  vancomycin  (VANCOCIN ) IVPB 1000 mg/200 mL premix        1,000 mg 200 mL/hr over 60 Minutes Intravenous  Once 10/28/23 1922 10/28/23 2126   10/28/23 1930  ceFEPIme  (MAXIPIME ) 2 g in sodium chloride  0.9 % 100 mL IVPB        2 g 200 mL/hr over 30 Minutes Intravenous  Once 10/28/23 1922 10/28/23 2013        I have personally reviewed the following labs and images: CBC: Recent Labs  Lab 11/01/23 1415 11/02/23 0725 11/02/23 1531 11/02/23 1534 11/03/23 0318 11/04/23 0349 11/05/23 0408  WBC 9.0 9.4  --   --  8.1 7.1 5.9  NEUTROABS 7.1  --   --   --   --   --   --   HGB 8.1* 7.6* 8.2*  7.8* 8.2* 7.7* 7.6* 9.1*  HCT 25.2* 23.6* 24.0*  23.0* 24.0* 23.8* 23.6* 27.7*  MCV 93.7 92.2  --   --  90.8 90.8 90.2  PLT 375 353  --   --  355 363 357   BMP &GFR Recent Labs  Lab 10/31/23 0810 11/01/23 0439 11/02/23 0725 11/02/23 1531 11/02/23 1534 11/03/23 0318 11/04/23 0349 11/05/23 0408  NA 137 140 137 138  139 135 137 136 136  K 3.4* 3.8 3.6 3.8  3.8 3.5 3.8 3.9 3.8  CL 102 107 103  --   --  99 100 100  CO2 25 24 24   --   --  24 24 23   GLUCOSE 100* 129* 94  --   --  93 89 93  BUN 7* 13 10  --   --  10 7*  8  CREATININE 0.90 0.98 0.88  --   --  0.90 0.99 0.98  CALCIUM  8.9 8.9 8.8*  --   --  9.0 8.8* 9.3  MG 1.8 1.8  --   --   --  1.5* 1.7 1.6*  PHOS 3.6  --   --   --   --   --   --   --  Estimated Creatinine Clearance: 66.7 mL/min (by C-G formula based on SCr of 0.98 mg/dL). Liver & Pancreas: Recent Labs  Lab 10/30/23 0304 10/31/23 0810 11/01/23 0439  AST 27  --  26  ALT 21  --  20  ALKPHOS 126  --  121  BILITOT 1.1  --  0.6  PROT 6.4*  --  5.2*  ALBUMIN  2.3* 2.0* 1.7*   No results for input(s): "LIPASE", "AMYLASE" in the last 168 hours. No results for input(s): "AMMONIA" in the last 168 hours. Diabetic: No results for input(s): "HGBA1C" in the last 72 hours. No results for input(s): "GLUCAP" in the last 168 hours. Cardiac Enzymes: Recent Labs  Lab 10/31/23 0809 11/01/23 0439  CKTOTAL 33* 23*   No results for input(s): "PROBNP" in the last 8760 hours. Coagulation Profile: No results for input(s): "INR", "PROTIME" in the last 168 hours.  Thyroid  Function Tests: No results for input(s): "TSH", "T4TOTAL", "FREET4", "T3FREE", "THYROIDAB" in the last 72 hours.  Lipid Profile: No results for input(s): "CHOL", "HDL", "LDLCALC", "TRIG", "CHOLHDL", "LDLDIRECT" in the last 72 hours.  Anemia Panel: No results for input(s): "VITAMINB12", "FOLATE", "FERRITIN", "TIBC", "IRON", "RETICCTPCT" in the last 72 hours.  Urine analysis:    Component Value Date/Time   COLORURINE AMBER (A) 10/28/2023 1922   APPEARANCEUR CLEAR 10/28/2023 1922   LABSPEC 1.018 10/28/2023 1922   PHURINE 6.0 10/28/2023 1922   GLUCOSEU NEGATIVE 10/28/2023 1922   HGBUR NEGATIVE 10/28/2023 1922   BILIRUBINUR NEGATIVE 10/28/2023 1922   KETONESUR NEGATIVE 10/28/2023 1922   PROTEINUR NEGATIVE 10/28/2023 1922   UROBILINOGEN 1.0 02/01/2012 1300   NITRITE NEGATIVE 10/28/2023 1922   LEUKOCYTESUR NEGATIVE 10/28/2023 1922   Sepsis Labs: Invalid input(s): "PROCALCITONIN", "LACTICIDVEN"  Microbiology: Recent  Results (from the past 240 hours)  Resp panel by RT-PCR (RSV, Flu A&B, Covid) Anterior Nasal Swab     Status: None   Collection Time: 10/28/23  7:22 PM   Specimen: Anterior Nasal Swab  Result Value Ref Range Status   SARS Coronavirus 2 by RT PCR NEGATIVE NEGATIVE Final    Comment: (NOTE) SARS-CoV-2 target nucleic acids are NOT DETECTED.  The SARS-CoV-2 RNA is generally detectable in upper respiratory specimens during the acute phase of infection. The lowest concentration of SARS-CoV-2 viral copies this assay can detect is 138 copies/mL. A negative result does not preclude SARS-Cov-2 infection and should not be used as the sole basis for treatment or other patient management decisions. A negative result may occur with  improper specimen collection/handling, submission of specimen other than nasopharyngeal swab, presence of viral mutation(s) within the areas targeted by this assay, and inadequate number of viral copies(<138 copies/mL). A negative result must be combined with clinical observations, patient history, and epidemiological information. The expected result is Negative.  Fact Sheet for Patients:  BloggerCourse.com  Fact Sheet for Healthcare Providers:  SeriousBroker.it  This test is no t yet approved or cleared by the [place]  FDA and  has been authorized for detection and/or diagnosis of SARS-CoV-2 by FDA under an Emergency Use Authorization (EUA). This EUA will remain  in effect (meaning this test can be used) for the duration of the COVID-19 declaration under Section 564(b)(1) of the Act, 21 U.S.C.section 360bbb-3(b)(1), unless the authorization is terminated  or revoked sooner.       Influenza A by PCR NEGATIVE NEGATIVE Final   Influenza B by PCR NEGATIVE NEGATIVE Final    Comment: (NOTE) The Xpert Xpress SARS-CoV-2/FLU/RSV plus assay is intended as an aid in  the diagnosis of influenza from Nasopharyngeal swab  specimens and should not be used as a sole basis for treatment. Nasal washings and aspirates are unacceptable for Xpert Xpress SARS-CoV-2/FLU/RSV testing.  Fact Sheet for Patients: BloggerCourse.com  Fact Sheet for Healthcare Providers: SeriousBroker.it  This test is not yet approved or cleared by the [place]  FDA and has been authorized for detection and/or diagnosis of SARS-CoV-2 by FDA under an Emergency Use Authorization (EUA). This EUA will remain in effect (meaning this test can be used) for the duration of the COVID-19 declaration under Section 564(b)(1) of the Act, 21 U.S.C. section 360bbb-3(b)(1), unless the authorization is terminated or revoked.     Resp Syncytial Virus by PCR NEGATIVE NEGATIVE Final    Comment: (NOTE) Fact Sheet for Patients: BloggerCourse.com  Fact Sheet for Healthcare Providers: SeriousBroker.it  This test is not yet approved or cleared by the [place]  FDA and has been authorized for detection and/or diagnosis of SARS-CoV-2 by FDA under an Emergency Use Authorization (EUA). This EUA will remain in effect (meaning this test can be used) for the duration of the COVID-19 declaration under Section 564(b)(1) of the Act, 21 U.S.C. section 360bbb-3(b)(1), unless the authorization is terminated or revoked.  Performed at [facility], [address] [place], [place] [address]   Blood Culture (routine x 2)     Status: None   Collection Time: 10/28/23  7:33 PM   Specimen: Left Antecubital; Blood  Result Value Ref Range Status   Specimen Description LEFT ANTECUBITAL BLOOD  Final   Special Requests   Final    BOTTLES DRAWN AEROBIC AND ANAEROBIC Blood Culture adequate volume   Culture   Final    NO GROWTH 5 DAYS Performed at [facility], [address] [place], [place] [address]    Report Status 11/02/2023 FINAL  Final  Blood Culture (routine  x 2)     Status: None   Collection Time: 10/28/23  7:43 PM   Specimen: BLOOD RIGHT FOREARM  Result Value Ref Range Status   Specimen Description BLOOD RIGHT FOREARM BLOOD  Final   Special Requests   Final    BOTTLES DRAWN AEROBIC AND ANAEROBIC Blood Culture adequate volume   Culture   Final    NO GROWTH 5 DAYS Performed at [facility], [address], [place], [place] [address]    Report Status 11/02/2023 FINAL  Final  Respiratory (~20 pathogens) panel by PCR     Status: None   Collection Time: 10/29/23  5:20 PM   Specimen: Nasopharyngeal Swab; Respiratory  Result Value Ref Range Status   Adenovirus NOT DETECTED NOT DETECTED Final   Coronavirus 229E NOT DETECTED NOT DETECTED Final    Comment: (NOTE) The Coronavirus on the Respiratory Panel, DOES NOT test for the novel  Coronavirus (2019 nCoV)    Coronavirus HKU1 NOT DETECTED NOT DETECTED Final   Coronavirus NL63 NOT DETECTED NOT DETECTED Final   Coronavirus OC43 NOT DETECTED NOT DETECTED Final   Metapneumovirus NOT DETECTED NOT DETECTED Final   Rhinovirus / Enterovirus NOT DETECTED NOT DETECTED Final   Influenza A NOT DETECTED NOT DETECTED Final   Influenza B NOT DETECTED NOT DETECTED Final   Parainfluenza Virus 1 NOT DETECTED NOT DETECTED Final   Parainfluenza Virus 2 NOT DETECTED NOT DETECTED Final   Parainfluenza Virus 3 NOT DETECTED NOT DETECTED Final   Parainfluenza Virus 4 NOT DETECTED NOT DETECTED Final   Respiratory Syncytial Virus NOT DETECTED NOT DETECTED Final   Bordetella pertussis NOT  DETECTED NOT DETECTED Final   Bordetella Parapertussis NOT DETECTED NOT DETECTED Final   Chlamydophila pneumoniae NOT DETECTED NOT DETECTED Final   Mycoplasma pneumoniae NOT DETECTED NOT DETECTED Final    Comment: Performed at [facility], [address], [place], [place] [address]    Radiology Studies: No results found.     Aydon Swamy T. Lucio Litsey [facility]  If 7PM-7AM, please contact  night-coverage [email or web address] 11/05/2023, 11:12 AM

## 2023-11-05 NOTE — Progress Notes (Addendum)
 Patient Name: Don [name] Date of Encounter: 11/05/2023 [facility] Cardiologist: Teddie Favre, MD    Interval Summary  .    Patient denies chest pain, shortness of breath, orthopnea. He has a dry cough and some wheezing that improve with breathing treatments. No palpitations   Vital Signs .    Vitals:   11/04/23 1825 11/04/23 1942 11/05/23 0319 11/05/23 0700  BP:  124/72 122/63   Pulse: 79 84 74   Resp:  17 20   Temp: 98.9 F (37.2 C) 99.6 F (37.6 C) 100 F (37.8 C) 98.6 F (37 C)  TempSrc: Oral Oral Oral Oral  SpO2:  95%    Weight:      Height:        Intake/Output Summary (Last 24 hours) at 11/05/2023 0726 Last data filed at 11/05/2023 0322 Gross per 24 hour  Intake 951 ml  Output 851 ml  Net 100 ml      10/30/2023    5:25 PM 10/28/2023    6:56 PM 10/14/2023    6:14 AM  Last 3 Weights  Weight (lbs) 164 lb 10.9 oz 165 lb 5.5 oz 164 lb 14.5 oz  Weight (kg) 74.7 kg 75 kg 74.8 kg      Telemetry/ECG    Predominantly NSR, occasional PVCs - Personally Reviewed  Physical Exam .   GEN: No acute distress.  Laying in the bed with head elevated  Neck: No JVD Cardiac:  RRR, no murmurs, rubs, or gallops.  Respiratory: Some expiratory wheezing, otherwise clear. Normal WOB on room air GI: Soft, nontender, non-distended  MS: No edema in BLE   Assessment & Plan .     NSTEMI / CAD  Myocarditis  - Patient admitted on 10/28/23 after he presented to [facility] complaining of acute onset substernal chest pain, abdominal pain. Febrile to 100.1 F - hsTn 77>597>>1084>>779>>580>>336 - Echocardiogram this admission showed EF 55-60%, no regional wall motion abnormalities, normal RV function - ESR and CRP elevated- up to 104 and 19.3 respectively. Raised concern for myocarditis  - Cardiac MRI on 11/01/23 showed diffusely elevated myocardial T2 values (along with T1 and ECV), suggesting myocardial edema. Met criteria for acute myocarditis.  - LHC on 11/02/23  showed a high-grade ostial lesion of very small diagonal which should be treated medically, mild to moderate disease elsewhere  - Continue current medical therapy for CAD with aspirin , carvedilol , crestor   - Per discussion with Dr. Bruce Caper with AHF- holding off on steroids unless ventricular ectopy becomes frequent. Per telemetry, has about a 2.5% PVC burden. No steroids for now  - Patient chest pain free this AM  - Palliative care consulted on 2/8- per their note, patient was withdrawn and did not engage with goals of care discussion. His sister is open to ongoing conversations   Acute on Chronic HfrEF - Patient previously had EF as low at 20-25% in 2020. EF improved to 50% in 2023 - Echo this admission showed EF 55-60%, no regional WMAs, normal RV function  - Cardiac MRI this admission showed LVEF 37%, RVEF 39%. Met criteria for myocarditis. There was also dense LGE in the basal septum and inferior walls, along with RV insertion sites, which is a nonischemic scar pattern. Could be due to myocarditis vs sarcoidosis. Global ECV markedly elevated (43%), which is in amyloid range. Likely due to diffuse myocardial edema rather than amyloid. Could consider repeat cardiac MRI in 3 months or outpatient PET. However, unlikely this would effect  treatment plan  - Patient denies shortness of breath, orthopnea. Is euvolemic on exam today  - Continue carvedilol  6.25 mg BID, spironolactone  12.5 mg daily  - Advanced heart failure was consulted on 2/6- per their recommendations, avoiding ARB with cirrhotic physiology and recent midodrine  use. Patient not a candidate for advanced therapies   PSVT  PVCs  - Patient does not have SVT noted on telemetry today. Overall, has about a 2.5% PVC burden.  - Asymptomatic - Continue carvedilol  6.25 mg BID - K 3.8, mag 1.6. Supplementation ordered by primary team   HLD  - Continue crestor  5 mg daily   Otherwise per primary  - Acute on chronic anemia  - History of  hepatitis C with hepatic cirrhosis  - Intermittent fever  - Gout  - Euthyroid sick syndrome  -  For questions or updates, please contact [facility] Please consult [email or web address] for contact info under        Signed, Don Fang, PA-C   Patient seen and examined with KJ PA-C.  Agree as above, with the following exceptions and changes as noted below. Pt feels ok today, about to work with PT. Gen: NAD, CV: RRR, no murmurs, Lungs: clear, Abd: soft, Extrem: Warm, well perfused, no edema, Neuro/Psych: alert and oriented x 3, normal mood and affect. All available labs, radiology testing, previous records reviewed. At this juncture, patient appears to be on appropriate therapy as able for myocarditis and CAD. No further titrations needed at this time. Agree with palliative care involvement, notes reviewed.   Cardiology will sign off at this time, however do not hesitate to call our service for any concerns or to coordinate follow up prior to hospital dismissal.   Don Herrlich, MD 11/05/23 3:09 PM

## 2023-11-05 NOTE — Progress Notes (Signed)
 Physical Therapy Treatment Patient Details Name: Don [name] MRN: [id number] DOB: 1945-10-06 Today's Date: 11/05/2023   History of Present Illness Don [name] is a 78 y.o. male admitted 10/28/23 for chest pain and fever. EKG with supraventricular tachycardia with rate 158, converted back to NSR following treatment of fever with Tylenol  and 1 L NS bolus. Limited echocardiogram was obtained which shows a preserved EF of 55 to 60% with no regional wall motion abnormalities. Of note, pt was recently admitted 1/3-1/9 for evaluation of syncope and this was felt to be due to orthostasis as he had been on Lasix  and 1/18-1/21 for dehydration in the setting of poor oral intake. PMH of chronic systolic CHF, CKD stage IIIa, HTN, HLD, cirrhosis, gout, and hepatitis C.    PT Comments  Pt pleasant and agreeable to PT session. He demonstrated improved bed mobility performing supine>sit with supervision. Pt was able to advance transfers completing a step pivot to the recliner chair. He requires modA x2 for STS and bed>chair using RW. Pt is unable to achieve a fully erect posture in standing with VC/TC. He maintained fwd flex trunk and hip flex ~15 degrees. Pt quickly fatigues in standing and demonstrates poor safety awareness and minimal eccentric control. Pt engaged in seated BLE exercises to maintain AROM/strength. Pt will continue to benefit from acute PT services to advance independence with functional mobility and safety prior to d/c to post-acute rehab, <3 hours/day therapy.      If plan is discharge home, recommend the following: A lot of help with bathing/dressing/bathroom;Help with stairs or ramp for entrance;Assistance with cooking/housework;Assist for transportation;Two people to help with walking and/or transfers   Can travel by private vehicle     Yes  Equipment Recommendations  Wheelchair (measurements PT);Wheelchair cushion (measurements PT) (If pt were to go Home would need 24/7 support, HHPT,  and the above equipment.)    Recommendations for Other Services       Precautions / Restrictions Precautions Precautions: Fall Restrictions Weight Bearing Restrictions Per Provider Order: No     Mobility  Bed Mobility Overal bed mobility: Needs Assistance Bed Mobility: Supine to Sit     Supine to sit: HOB elevated, Used rails, Supervision     General bed mobility comments: Pt required increased time but was able to sit up on the L side of the bed,  bring BLE off EOB, and scooting to the edge with use of handrails.    Transfers Overall transfer level: Needs assistance Equipment used: Rolling walker (2 wheels) Transfers: Sit to/from Stand, Bed to chair/wheelchair/BSC Sit to Stand: Mod assist, +2 safety/equipment, From elevated surface   Step pivot transfers: Mod assist, +2 safety/equipment       General transfer comment: Pt performed STS x2 EOB from slightly raised surface to allow BLE to get further underneath him given his height. Cued pt to use momentum and increase fwd lean to assist in powering up, modA x2. In standing, pt maintains flexed posture and limited hip extension. Pt appears to maintain ~15 degrees of hip flex, which is not correct with VC/TC. Bed>chair transfer to the L, pt required modA x2 and VC/TC for sequencing. Poor eccentric control and safety awarness noted during transfers.    Ambulation/Gait Ambulation/Gait assistance: Mod assist, +2 safety/equipment Gait Distance (Feet): 2 Feet Assistive device: Rolling walker (2 wheels) Gait Pattern/deviations: Decreased step length - left, Decreased stride length, Trunk flexed, Shuffle, Step-to pattern       General Gait Details: Pt took short steps  fwd, sideways, and pivoting from bed>recliner chair. Pt shuffled along the ground, no floor clearence observed. VC/TC for sequencing of RW and BLE during transfer. Pt had difficulty stepping bkwds likely d/t fatigue and sat quickly. PT facilitated hip alignment with  chair as he was further from the surface than he realized.   Stairs             Wheelchair Mobility     Tilt Bed    Modified Rankin (Stroke Patients Only)       Balance Overall balance assessment: Needs assistance Sitting-balance support: No upper extremity supported, Feet supported Sitting balance-Leahy Scale: Fair Sitting balance - Comments: Pt sat EOB with supervision.   Standing balance support: Bilateral upper extremity supported, During functional activity, Reliant on assistive device for balance Standing balance-Leahy Scale: Poor Standing balance comment: Pt relies on RW when OOB and maintains fwd flex posture in standing.                            Cognition Arousal: Alert Behavior During Therapy: WFL for tasks assessed/performed Overall Cognitive Status: Within Functional Limits for tasks assessed                                          Exercises General Exercises - Lower Extremity Ankle Circles/Pumps: Seated, Both, 10 reps, AROM Gluteal Sets: Seated, Both, 15 reps, Strengthening (with 5 second hold) Long Arc Quad: Seated, Both, 10 reps, Strengthening (with 5 second hold at top) Hip Flexion/Marching: Seated, Both, 15 reps, Strengthening Toe Raises: Seated, Both, 10 reps, AROM Heel Raises: Seated, Both, 10 reps, AROM    General Comments General comments (skin integrity, edema, etc.): VSS on RA      Pertinent Vitals/Pain Pain Assessment Pain Assessment: No/denies pain    Home Living                          Prior Function            PT Goals (current goals can now be found in the care plan section) Acute Rehab PT Goals Patient Stated Goal: Move better Progress towards PT goals: Progressing toward goals    Frequency    Min 1X/week      PT Plan      Co-evaluation              AM-PAC PT "6 Clicks" Mobility   Outcome Measure  Help needed turning from your back to your side while in a  flat bed without using bedrails?: A Lot Help needed moving from lying on your back to sitting on the side of a flat bed without using bedrails?: A Lot Help needed moving to and from a bed to a chair (including a wheelchair)?: Total Help needed standing up from a chair using your arms (e.g., wheelchair or bedside chair)?: Total Help needed to walk in hospital room?: Total Help needed climbing 3-5 steps with a railing? : Total 6 Click Score: 8    End of Session Equipment Utilized During Treatment: Gait belt Activity Tolerance: Patient limited by fatigue Patient left: in chair;with chair alarm set;with call bell/phone within reach Nurse Communication: Mobility status PT Visit Diagnosis: Unsteadiness on feet (R26.81);Other abnormalities of gait and mobility (R26.89);Muscle weakness (generalized) (M62.81);Pain     Time: 1610-9604 PT Time Calculation (min) (ACUTE  ONLY): 25 min  Charges:    $Therapeutic Exercise: 8-22 mins $Therapeutic Activity: 8-22 mins                       Glenford Lanes, PT, DPT Acute Rehabilitation Services Office: [phone] Secure Chat Preferred   Riva Chester 11/05/2023, 4:06 PM

## 2023-11-05 NOTE — Care Management Important Message (Signed)
 Important Message  Patient Details  Name: Don [name] MRN: [id number] Date of Birth: [birth year]   Important Message Given:  Yes - Medicare IM     [name] 11/05/2023, 11:32 AM

## 2023-11-06 ENCOUNTER — Inpatient Hospital Stay (HOSPITAL_COMMUNITY): Payer: 59

## 2023-11-06 DIAGNOSIS — I409 Acute myocarditis, unspecified: Secondary | ICD-10-CM | POA: Diagnosis not present

## 2023-11-06 DIAGNOSIS — Z7189 Other specified counseling: Secondary | ICD-10-CM | POA: Diagnosis not present

## 2023-11-06 DIAGNOSIS — Z515 Encounter for palliative care: Secondary | ICD-10-CM | POA: Diagnosis not present

## 2023-11-06 LAB — CBC
HCT: 27.7 % — ABNORMAL LOW (ref 39.0–52.0)
Hemoglobin: 9.1 g/dL — ABNORMAL LOW (ref 13.0–17.0)
MCH: 29.7 pg (ref 26.0–34.0)
MCHC: 32.9 g/dL (ref 30.0–36.0)
MCV: 90.5 fL (ref 80.0–100.0)
Platelets: 380 10*3/uL (ref 150–400)
RBC: 3.06 MIL/uL — ABNORMAL LOW (ref 4.22–5.81)
RDW: 15.2 % (ref 11.5–15.5)
WBC: 6.2 10*3/uL (ref 4.0–10.5)
nRBC: 0 % (ref 0.0–0.2)

## 2023-11-06 LAB — RENAL FUNCTION PANEL
Albumin: 2 g/dL — ABNORMAL LOW (ref 3.5–5.0)
Anion gap: 11 (ref 5–15)
BUN: 7 mg/dL — ABNORMAL LOW (ref 8–23)
CO2: 25 mmol/L (ref 22–32)
Calcium: 9.2 mg/dL (ref 8.9–10.3)
Chloride: 101 mmol/L (ref 98–111)
Creatinine, Ser: 0.92 mg/dL (ref 0.61–1.24)
GFR, Estimated: >60 mL/min (ref >60)
Glucose, Bld: 100 mg/dL — ABNORMAL HIGH (ref 70–99)
Phosphorus: 3.5 mg/dL (ref 2.5–4.6)
Potassium: 3.9 mmol/L (ref 3.5–5.1)
Sodium: 137 mmol/L (ref 135–145)

## 2023-11-06 LAB — RESPIRATORY PANEL BY PCR

## 2023-11-06 LAB — MAGNESIUM: Magnesium: 1.8 mg/dL (ref 1.7–2.4)

## 2023-11-06 LAB — PROTEIN ELECTROPHORESIS, SERUM
A/G Ratio: 0.7 (ref 0.7–1.7)
Albumin ELP: 2.4 g/dL — ABNORMAL LOW (ref 2.9–4.4)
Alpha-1-Globulin: 0.4 g/dL (ref 0.0–0.4)
Alpha-2-Globulin: 1.2 g/dL — ABNORMAL HIGH (ref 0.4–1.0)
Beta Globulin: 0.9 g/dL (ref 0.7–1.3)
Gamma Globulin: 1.1 g/dL (ref 0.4–1.8)
Globulin, Total: 3.6 g/dL (ref 2.2–3.9)
Total Protein ELP: 6 g/dL (ref 6.0–8.5)

## 2023-11-06 LAB — SARS CORONAVIRUS 2 BY RT PCR: SARS Coronavirus 2 by RT PCR: NEGATIVE

## 2023-11-06 LAB — C-REACTIVE PROTEIN: CRP: 7.4 mg/dL — ABNORMAL HIGH (ref <1.0)

## 2023-11-06 MED ORDER — COLCHICINE 0.6 MG PO TABS: 0.6000 mg | ORAL_TABLET | Freq: Every day | ORAL | Status: AC | PRN

## 2023-11-06 MED ORDER — BENZONATATE 100 MG PO CAPS
100.0000 mg | ORAL_CAPSULE | Freq: Three times a day (TID) | ORAL | Status: DC
  Administered 2023-11-06 – 2023-11-09 (×12): 100 mg via ORAL
  Filled 2023-11-06 (×12): qty 1

## 2023-11-06 MED ORDER — GUAIFENESIN ER 600 MG PO TB12
600.0000 mg | ORAL_TABLET | Freq: Two times a day (BID) | ORAL | Status: DC
  Administered 2023-11-06 – 2023-11-09 (×8): 600 mg via ORAL
  Filled 2023-11-06 (×8): qty 1

## 2023-11-06 MED ORDER — SENNOSIDES-DOCUSATE SODIUM 8.6-50 MG PO TABS: 1.0000 | ORAL_TABLET | Freq: Two times a day (BID) | ORAL | Status: AC | PRN

## 2023-11-06 MED ORDER — POLYETHYLENE GLYCOL 3350 17 G PO PACK: 17.0000 g | PACK | Freq: Two times a day (BID) | ORAL | Status: AC | PRN

## 2023-11-06 MED ORDER — CEFADROXIL 500 MG PO CAPS: 1000.0000 mg | ORAL_CAPSULE | Freq: Two times a day (BID) | ORAL | Status: DC

## 2023-11-06 MED ORDER — SPIRONOLACTONE 25 MG PO TABS: 12.5000 mg | ORAL_TABLET | Freq: Every day | ORAL | Status: AC

## 2023-11-06 MED ORDER — TRAZODONE HCL 50 MG PO TABS: 25.0000 mg | ORAL_TABLET | Freq: Every evening | ORAL | Status: AC | PRN

## 2023-11-06 MED ORDER — CARVEDILOL 6.25 MG PO TABS: 6.2500 mg | ORAL_TABLET | Freq: Two times a day (BID) | ORAL | Status: AC

## 2023-11-06 NOTE — TOC Progression Note (Addendum)
Transition of Care [facility]) - Progression Note    Patient Details  Name: Don [name] MRN: [id number] Date of Birth: [birth year]  Transition of [facility]) CM/SW Contact  Delilah Shan, LCSWA Phone Number: 11/06/2023, 11:11 AM  Clinical Narrative:     Patients insurance appeal for SNF currently pending. CSW will continue to follow and assist with patients dc planning needs.  Update- CSW received call from patients insurance who requested AOR form to be completed.Requested form completed and signed by patient and faxed to appeals.   Expected Discharge Plan: Skilled Nursing Facility Barriers to Discharge: Continued Medical Work up  Expected Discharge Plan and Services In-house Referral: Clinical Social Work     Living arrangements for the past 2 months: Skilled Nursing Facility                                       Social Determinants of Health (SDOH) Interventions SDOH Screenings   Food Insecurity: Patient Unable To Answer (10/30/2023)  Housing: Low Risk  (10/14/2023)  Transportation Needs: No Transportation Needs (10/14/2023)  Utilities: Not At Risk (10/14/2023)  Social Connections: Unknown (10/31/2023)  Tobacco Use: Medium Risk (10/28/2023)    Readmission Risk Interventions    10/29/2023   10:25 AM 10/14/2023   11:02 AM  Readmission Risk Prevention Plan  Transportation Screening Complete Complete  HRI or Home Care Consult  Complete  Social Work Consult for Recovery Care Planning/Counseling  Complete  Palliative Care Screening  Not Applicable  Medication Review Oceanographer) Complete Complete  HRI or Home Care Consult Complete   SW Recovery Care/Counseling Consult Complete   Palliative Care Screening Not Applicable   Skilled Nursing Facility Complete

## 2023-11-06 NOTE — Progress Notes (Signed)
Palliative:  HPI: 78 y.o. 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 Other differential diagnosis including sarcoidosis, and a small pericardial effusion.  Patient also with ongoing intermittent fever due to likely cellulitis.  PMT consulted to discuss goals of care.   I met today with Don [name]. No family at bedside. He complains of coughing and tells me this is worse since yesterday and it kept him awake all night. Continues with dry cough, no distress, discussed with Dr. Alanda Slim. He is awake and able to converse with me. He knows he is at [facility] and that it is February but does not recall the year. He does not have great recall - he remembers speaking with me yesterday but does not recall what we spoke about. I reminded him about our discussion regarding code status. We discussed his chronic health issues and risks of further complications and worsening health in the future. I discussed with him his desire for CPR/intubation/resuscitation acknowledging poor outcomes vs focusing on comfort at end of life. We discussed use of medications and conservative care to help him to do as well as he can for as long as he can. After discussing fully he agrees with DNR/DNI. He tells me that he is not afraid to die and is at peace when his time comes. He would rather pass away peacefully than go through resuscitation. He gives me permission to share our conversation with his sister.   I called and discussed with sister, Don [name]. I shared with Don [name] that I am from palliative care. We reviewed her brother health and current status. She has been in touch with CSW and is awaiting updates for potential placement. I shared with Dianne her brother's wishes for DNR/DNI. Don [name] tells me that she is not surprised and  figured this is what he would want. She does not feel he would want to go through a lot if his health worsens. She is pleased that he seems to be more stable at this time. She struggles to find a ride to come visit but is hoping she can work something out soon. She is available by phone.   All questions/concerns addressed. Emotional support provided.   Exam: Alert, awake. Mostly oriented. Poor recall but good understanding in the moment. No distress. Breathing regular, unlabored. Abd soft. Moves all extremities. RUE edema but no redness, warmth. No edema in lower extremities.    Plan: - DNR/DNI decided - Awaiting SNF rehab  50 min  Yong Channel, NP Palliative Medicine Team Pager [phone] (Please see [email or web address] for schedule) Team Phone [phone]

## 2023-11-06 NOTE — Progress Notes (Signed)
PROGRESS NOTE  Don [name] JYN:[id number] DOB: 1946-03-17   PCP: Don [name] [facility]  Patient is from: SNF.  Nonambulatory at baseline.  DOA: 10/28/2023 LOS: 9  Chief complaints Chief Complaint  Patient presents with   Chest Pain     Brief Narrative / Interim history: 78 year old M with PMH of HFimpEF, liver cirrhosis, hepatitis C, hypotension on midodrine, anemia, HTN, HLD and gout brought to [name] ED by EMS from [name] on 2/2 due to acute onset substernal chest pain and fever.    In the ED, temp 101.8 F, HR 160, RR 20, BP 175/105, SpO2 95% on room air.  WBC 9.7, hemoglobin 8.3, platelet count 381.  CMP without significant finding.  Trop 77>597.  EKG notable for supraventricular tachycardia, rate 158. Lactic acid 1.2.  COVID/influenza/RSV PCR negative.  UA and CXR without significant finding. Blood cultures x 2 obtained.  Started on broad-spectrum antibiotics.  Cardiology consulted.  Admission requested for acute myocarditis.  Patient was evaluated by cardiology who recommended transfer to [name] for further cardiac evaluation Cardiac MRI suggest this acute myocarditis.  Other differential diagnosis including sarcoidosis, and a small pericardial effusion.  Patient underwent R/LHC on 2/7 that showed mild LAD and proximal LCx lesion and high-grade ostial lesion of very small diagonal.  No stent placement.  SVT resolved.  Cardiology recommended medical management and signed off.  Continue to have mild intermittent fever without clear source.  Inflammatory markers elevated.  RVP and blood culture negative. ID consulted but signed off.  Noted left forearm swelling and increased warmth to touch.  Started on p.o. cefadroxil for cellulitis.  Fever curve down trended.    Failure to thrive picture.  Palliative medicine following.  Medically stable for discharge to SNF   Subjective: Seen and examined earlier this morning.  No major events overnight of this morning.   He reports intermittent cough.  Cough is mainly dry.  He denies chest pain or shortness of breath.  Has not had further fever.  No leukocytosis.  Objective: Vitals:   11/05/23 0830 11/05/23 1115 11/05/23 2037 11/06/23 0324  BP:  103/63 119/61 130/69  Pulse: 70 77 82 76  Resp: (!) 21 (!) 22 20 19   Temp:  98.6 F (37 C) 98.7 F (37.1 C) 99.2 F (37.3 C)  TempSrc:  Oral Oral Oral  SpO2:  100% 100% 97%  Weight:      Height:        Examination:  GENERAL: No apparent distress.  Appears frail. HEENT: MMM.  Vision and hearing grossly intact.  NECK: Supple.  No apparent JVD.  RESP:  No IWOB.  Fair aeration bilaterally. CVS:  RRR. Heart sounds normal.  ABD/GI/GU: BS+. Abd soft, NTND.  MSK/EXT:  Moves extremities.  Right forearm cellulitis improved. SKIN: no apparent skin lesion or wound NEURO: Awake, alert and oriented appropriately.  No apparent focal neuro deficit. PSYCH: Somewhat withdrawn with flat affect.  Procedures:  12/7-R/LHC on 2/7 that showed mild LAD and proximal LCx lesion and high-grade ostial lesion of very small diagonal.  Microbiology summarized: 2/2-COVID-19, influenza and RSV PCR nonreactive 2/3-A 20 pathogen RVP nonreactive 2/2-blood cultures negative 2/11-repeat COVID-19 and RVP  Assessment and plan: Acute myocarditis/suspected non-STEMI: Presented with acute substernal chest pain and fever.  Troponin 77>>> 1600>> 336 suggesting ACS.  Limited TTE without significant finding.  Infectious workup unrevealing.  Pro-Cal negative.  CRP elevated to 15>> 18.  ESR elevated to 90>> 104.  ANA normal.  Cardiac  MRI concerning for acute myocarditis.  R/LHC as above. -Cardiology-no plan for steroid.  Medical management for CAD and palliative consult.   -Follow SPEP. -Continue low-dose aspirin   Supraventricular tachycardia: HR elevated to 160s on arrival.  History of BB intolerance.  Resolved.  -Continue Coreg.  Tolerating. -Optimize electrolytes. -Continue telemetry    Intermittent fever/RUE cellulitis/markedly elevated inflammatory markers: Inflammatory markers markedly elevated. Infectious workup including CXR, UA, cultures, viral panel, left foot and right elbow x-ray unrevealing.  MRI suggestive of acute myocarditis.  ID consulted but signed off.  However, noted RUE swelling with increased warmth to touch concerning for cellulitis that has improved after starting cefadroxil on 2/9.. -Continue p.o. cefadroxil -Recheck CRP.  Chronic HFimpEF: Limited TTE with LVEF of 55 to 60% (previously 20 to 25%)., no RWMA.  BNP elevated to 1600.  Appears euvolemic on exam.   Not on diuretics.  Cardiac MRI and R/LHC as above. -Continue Coreg as above -Did not tolerate GDMT in the past due to orthostatic hypotension. -Strict intake and output, daily weight and renal functions  Cough: Patient reports frequent dry cough.  No chest pain or shortness of breath.  Denies difficulty swallowing.  Silent aspiration? -Mucolytic's and antitussive -SLP eval -CXR, COVID-19 and RVP  History of gout without acute flareup: While he is unusually high dose colchicine.  He is also on a statin which could increase his risk of rhabdo.  CK and uric acid not elevated. -Decreased colchicine to 0.6 mg daily as needed. -Continue home allopurinol.  Acute on chronic anemia: Although Hgb dropped about 2 g in the last 24 hours, baseline ~8.0.  No overt bleeding.  Hemoccult negative.  Normal LDH and slightly elevated haptoglobin argues against hemolysis.  Blood smear normal.  Inflammatory markers markedly elevated.  H&H stable after 1 unit. Recent Labs    11/01/23 0439 11/01/23 1000 11/01/23 1415 11/02/23 0725 11/02/23 1531 11/02/23 1534 11/03/23 0318 11/04/23 0349 11/05/23 0408 11/06/23 0418  HGB 7.7* 7.6* 8.1* 7.6* 8.2*  7.8* 8.2* 7.7* 7.6* 9.1* 9.1*  -Monitor H&H.  Goal Hgb > 8.0 given CAD.  History of liver cirrhosis/history of hepatitis C: Compensated.  Not sure if he has been  treated for hep C. -Follow hep C AB with reflex PCR -Outpatient follow-up with ID based on result  Euthyroid sick syndrome: Mild elevated TSH with normal free T4. -Repeat in 4 to 6 weeks  CKD 3A ruled out.  Hyperlipidemia -Continue Crestor 5 mg p.o. daily  Bilateral leg pain: Exam reassuring except for left foot tenderness.  X-ray negative.  No signs of infection.  Resolved. -Tylenol as needed -Voltaren gel  Ambulatory dysfunction: Patient reports using chairs with wheels to get around.  -PT/OT eval  Hypomagnesemia -Monitor replenish as appropriate   GERD -Protonix 40 mg p.o. daily  Constipation: Resolved -Continue bowel regimen as needed  Physical deconditioning: Too deconditioned and frail.  Poor overall prognosis.  Still full code. -Palliative consulted.  CODE STATUS changed to DNR/DNI on 2/11.   Body mass index is 22.34 kg/m.           DVT prophylaxis:  enoxaparin (LOVENOX) injection 40 mg Start: 11/03/23 1600 Place and maintain sequential compression device Start: 11/01/23 1234  Code Status: DNR/DNI. Family Communication: None at bedside. Level of care: Telemetry Cardiac Status is: Inpatient Remains inpatient appropriate because: Non-STEMI/elevated troponin/myocarditis/cellulitis/cough   Final disposition: Likely back to SNF Consultants:  Cardiology Infectious disease Palliative medicine  55 minutes with more than 50% spent in reviewing records, counseling  patient/family and coordinating care.   Sch Meds:  Scheduled Meds:  allopurinol  150 mg Oral Daily   ascorbic acid  500 mg Oral Q breakfast   aspirin EC  81 mg Oral Daily   benzonatate  100 mg Oral TID   calcitRIOL  0.25 mcg Oral Daily   carvedilol  6.25 mg Oral BID WC   cefadroxil  1,000 mg Oral BID   cholecalciferol  1,000 Units Oral Daily   cyanocobalamin  1,000 mcg Intramuscular Once per day on Monday Friday   diclofenac Sodium  2 g Topical QID   enoxaparin (LOVENOX) injection  40 mg  Subcutaneous Q24H   feeding supplement  237 mL Oral BID BM   ferrous sulfate  325 mg Oral Q breakfast   guaiFENesin  600 mg Oral BID   multivitamin with minerals  1 tablet Oral Daily   pantoprazole  40 mg Oral Daily   rosuvastatin  5 mg Oral Daily   sodium chloride flush  3 mL Intravenous Q12H   spironolactone  12.5 mg Oral Daily   umeclidinium bromide  1 puff Inhalation Daily   Continuous Infusions:   PRN Meds:.acetaminophen, albuterol, colchicine, guaiFENesin-dextromethorphan, hydrALAZINE, nitroGLYCERIN, ondansetron **OR** ondansetron (ZOFRAN) IV, phenol, [EXPIRED] polyethylene glycol **FOLLOWED BY** polyethylene glycol, [COMPLETED] senna-docusate **FOLLOWED BY** senna-docusate, sodium chloride flush, traZODone  Antimicrobials: Anti-infectives (From admission, onward)    Start     Dose/Rate Route Frequency Ordered Stop   11/06/23 0000  cefadroxil (DURICEF) 500 MG capsule        1,000 mg Oral 2 times daily 11/06/23 0715 11/11/23 2359   11/04/23 1000  cefadroxil (DURICEF) capsule 1,000 mg        1,000 mg Oral 2 times daily 11/04/23 0743 11/11/23 0959   10/28/23 1930  vancomycin (VANCOCIN) IVPB 1000 mg/200 mL premix        1,000 mg 200 mL/hr over 60 Minutes Intravenous  Once 10/28/23 1922 10/28/23 2126   10/28/23 1930  ceFEPIme (MAXIPIME) 2 g in sodium chloride 0.9 % 100 mL IVPB        2 g 200 mL/hr over 30 Minutes Intravenous  Once 10/28/23 1922 10/28/23 2013        I have personally reviewed the following labs and images: CBC: Recent Labs  Lab 11/01/23 1415 11/02/23 0725 11/02/23 1531 11/02/23 1534 11/03/23 0318 11/04/23 0349 11/05/23 0408 11/06/23 0418  WBC 9.0 9.4  --   --  8.1 7.1 5.9 6.2  NEUTROABS 7.1  --   --   --   --   --   --   --   HGB 8.1* 7.6*   < > 8.2* 7.7* 7.6* 9.1* 9.1*  HCT 25.2* 23.6*   < > 24.0* 23.8* 23.6* 27.7* 27.7*  MCV 93.7 92.2  --   --  90.8 90.8 90.2 90.5  PLT 375 353  --   --  355 363 357 380   < > = values in this interval not  displayed.   BMP &GFR Recent Labs  Lab 10/31/23 0810 11/01/23 0439 11/02/23 0725 11/02/23 1531 11/02/23 1534 11/03/23 0318 11/04/23 0349 11/05/23 0408 11/06/23 0418  NA 137 140 137   < > 135 137 136 136 137  K 3.4* 3.8 3.6   < > 3.5 3.8 3.9 3.8 3.9  CL 102 107 103  --   --  99 100 100 101  CO2 25 24 24   --   --  24 24 23 25   GLUCOSE 100* 129*  94  --   --  93 89 93 100*  BUN 7* 13 10  --   --  10 7* 8 7*  CREATININE 0.90 0.98 0.88  --   --  0.90 0.99 0.98 0.92  CALCIUM 8.9 8.9 8.8*  --   --  9.0 8.8* 9.3 9.2  MG 1.8 1.8  --   --   --  1.5* 1.7 1.6* 1.8  PHOS 3.6  --   --   --   --   --   --   --  3.5   < > = values in this interval not displayed.   Estimated Creatinine Clearance: 71 mL/min (by C-G formula based on SCr of 0.92 mg/dL). Liver & Pancreas: Recent Labs  Lab 10/31/23 0810 11/01/23 0439 11/06/23 0418  AST  --  26  --   ALT  --  20  --   ALKPHOS  --  121  --   BILITOT  --  0.6  --   PROT  --  5.2*  --   ALBUMIN 2.0* 1.7* 2.0*   No results for input(s): "LIPASE", "AMYLASE" in the last 168 hours. No results for input(s): "AMMONIA" in the last 168 hours. Diabetic: No results for input(s): "HGBA1C" in the last 72 hours. No results for input(s): "GLUCAP" in the last 168 hours. Cardiac Enzymes: Recent Labs  Lab 10/31/23 0809 11/01/23 0439  CKTOTAL 33* 23*   No results for input(s): "PROBNP" in the last 8760 hours. Coagulation Profile: No results for input(s): "INR", "PROTIME" in the last 168 hours.  Thyroid Function Tests: No results for input(s): "TSH", "T4TOTAL", "FREET4", "T3FREE", "THYROIDAB" in the last 72 hours.  Lipid Profile: No results for input(s): "CHOL", "HDL", "LDLCALC", "TRIG", "CHOLHDL", "LDLDIRECT" in the last 72 hours.  Anemia Panel: No results for input(s): "VITAMINB12", "FOLATE", "FERRITIN", "TIBC", "IRON", "RETICCTPCT" in the last 72 hours.  Urine analysis:    Component Value Date/Time   COLORURINE AMBER (A) 10/28/2023 1922    APPEARANCEUR CLEAR 10/28/2023 1922   LABSPEC 1.018 10/28/2023 1922   PHURINE 6.0 10/28/2023 1922   GLUCOSEU NEGATIVE 10/28/2023 1922   HGBUR NEGATIVE 10/28/2023 1922   BILIRUBINUR NEGATIVE 10/28/2023 1922   KETONESUR NEGATIVE 10/28/2023 1922   PROTEINUR NEGATIVE 10/28/2023 1922   UROBILINOGEN 1.0 02/01/2012 1300   NITRITE NEGATIVE 10/28/2023 1922   LEUKOCYTESUR NEGATIVE 10/28/2023 1922   Sepsis Labs: Invalid input(s): "PROCALCITONIN", "LACTICIDVEN"  Microbiology: Recent Results (from the past 240 hours)  Resp panel by RT-PCR (RSV, Flu A&B, Covid) Anterior Nasal Swab     Status: None   Collection Time: 10/28/23  7:22 PM   Specimen: Anterior Nasal Swab  Result Value Ref Range Status   SARS Coronavirus 2 by RT PCR NEGATIVE NEGATIVE Final    Comment: (NOTE) SARS-CoV-2 target nucleic acids are NOT DETECTED.  The SARS-CoV-2 RNA is generally detectable in upper respiratory specimens during the acute phase of infection. The lowest concentration of SARS-CoV-2 viral copies this assay can detect is 138 copies/mL. A negative result does not preclude SARS-Cov-2 infection and should not be used as the sole basis for treatment or other patient management decisions. A negative result may occur with  improper specimen collection/handling, submission of specimen other than nasopharyngeal swab, presence of viral mutation(s) within the areas targeted by this assay, and inadequate number of viral copies(<138 copies/mL). A negative result must be combined with clinical observations, patient history, and epidemiological information. The expected result is Negative.  Fact Sheet for Patients:  BloggerCourse.com  Fact Sheet for Healthcare Providers:  SeriousBroker.it  This test is no t yet approved or cleared by the [place] FDA and  has been authorized for detection and/or diagnosis of SARS-CoV-2 by FDA under an Emergency Use Authorization  (EUA). This EUA will remain  in effect (meaning this test can be used) for the duration of the COVID-19 declaration under Section 564(b)(1) of the Act, 21 U.S.C.section 360bbb-3(b)(1), unless the authorization is terminated  or revoked sooner.       Influenza A by PCR NEGATIVE NEGATIVE Final   Influenza B by PCR NEGATIVE NEGATIVE Final    Comment: (NOTE) The Xpert Xpress SARS-CoV-2/FLU/RSV plus assay is intended as an aid in the diagnosis of influenza from Nasopharyngeal swab specimens and should not be used as a sole basis for treatment. Nasal washings and aspirates are unacceptable for Xpert Xpress SARS-CoV-2/FLU/RSV testing.  Fact Sheet for Patients: BloggerCourse.com  Fact Sheet for Healthcare Providers: SeriousBroker.it  This test is not yet approved or cleared by the [place] FDA and has been authorized for detection and/or diagnosis of SARS-CoV-2 by FDA under an Emergency Use Authorization (EUA). This EUA will remain in effect (meaning this test can be used) for the duration of the COVID-19 declaration under Section 564(b)(1) of the Act, 21 U.S.C. section 360bbb-3(b)(1), unless the authorization is terminated or revoked.     Resp Syncytial Virus by PCR NEGATIVE NEGATIVE Final    Comment: (NOTE) Fact Sheet for Patients: BloggerCourse.com  Fact Sheet for Healthcare Providers: SeriousBroker.it  This test is not yet approved or cleared by the [place] FDA and has been authorized for detection and/or diagnosis of SARS-CoV-2 by FDA under an Emergency Use Authorization (EUA). This EUA will remain in effect (meaning this test can be used) for the duration of the COVID-19 declaration under Section 564(b)(1) of the Act, 21 U.S.C. section 360bbb-3(b)(1), unless the authorization is terminated or revoked.  Performed at [facility], [address], [place], [place]  [address]   Blood Culture (routine x 2)     Status: None   Collection Time: 10/28/23  7:33 PM   Specimen: Left Antecubital; Blood  Result Value Ref Range Status   Specimen Description LEFT ANTECUBITAL BLOOD  Final   Special Requests   Final    BOTTLES DRAWN AEROBIC AND ANAEROBIC Blood Culture adequate volume   Culture   Final    NO GROWTH 5 DAYS Performed at [facility], [address], [place], [place] [address]    Report Status 11/02/2023 FINAL  Final  Blood Culture (routine x 2)     Status: None   Collection Time: 10/28/23  7:43 PM   Specimen: BLOOD RIGHT FOREARM  Result Value Ref Range Status   Specimen Description BLOOD RIGHT FOREARM BLOOD  Final   Special Requests   Final    BOTTLES DRAWN AEROBIC AND ANAEROBIC Blood Culture adequate volume   Culture   Final    NO GROWTH 5 DAYS Performed at [facility], [address], [place], [place] [address]    Report Status 11/02/2023 FINAL  Final  Respiratory (~20 pathogens) panel by PCR     Status: None   Collection Time: 10/29/23  5:20 PM   Specimen: Nasopharyngeal Swab; Respiratory  Result Value Ref Range Status   Adenovirus NOT DETECTED NOT DETECTED Final   Coronavirus 229E NOT DETECTED NOT DETECTED Final    Comment: (NOTE) The Coronavirus on the Respiratory Panel, DOES NOT test for the novel  Coronavirus (2019  nCoV)    Coronavirus HKU1 NOT DETECTED NOT DETECTED Final   Coronavirus NL63 NOT DETECTED NOT DETECTED Final   Coronavirus OC43 NOT DETECTED NOT DETECTED Final   Metapneumovirus NOT DETECTED NOT DETECTED Final   Rhinovirus / Enterovirus NOT DETECTED NOT DETECTED Final   Influenza A NOT DETECTED NOT DETECTED Final   Influenza B NOT DETECTED NOT DETECTED Final   Parainfluenza Virus 1 NOT DETECTED NOT DETECTED Final   Parainfluenza Virus 2 NOT DETECTED NOT DETECTED Final   Parainfluenza Virus 3 NOT DETECTED NOT DETECTED Final   Parainfluenza Virus 4 NOT DETECTED NOT DETECTED Final   Respiratory Syncytial Virus NOT  DETECTED NOT DETECTED Final   Bordetella pertussis NOT DETECTED NOT DETECTED Final   Bordetella Parapertussis NOT DETECTED NOT DETECTED Final   Chlamydophila pneumoniae NOT DETECTED NOT DETECTED Final   Mycoplasma pneumoniae NOT DETECTED NOT DETECTED Final    Comment: Performed at [facility], [address] [place], [place] [address]    Radiology Studies: No results found.     Mattisyn Cardona T. [facility]  If 7PM-7AM, please contact night-coverage [email or web address] 11/06/2023, 10:48 AM

## 2023-11-06 NOTE — Plan of Care (Signed)

## 2023-11-07 DIAGNOSIS — Z7189 Other specified counseling: Secondary | ICD-10-CM | POA: Diagnosis not present

## 2023-11-07 DIAGNOSIS — Z515 Encounter for palliative care: Secondary | ICD-10-CM | POA: Diagnosis not present

## 2023-11-07 DIAGNOSIS — I409 Acute myocarditis, unspecified: Secondary | ICD-10-CM | POA: Diagnosis not present

## 2023-11-07 LAB — HCV RT-PCR, QUANT (NON-GRAPH): Hepatitis C Quantitation: NOT DETECTED [IU]/mL

## 2023-11-07 LAB — HCV AB W REFLEX TO QUANT PCR: HCV Ab: REACTIVE — AB

## 2023-11-07 MED ORDER — ACETAMINOPHEN 325 MG PO TABS
650.0000 mg | ORAL_TABLET | ORAL | Status: DC | PRN
  Administered 2023-11-07: 650 mg via ORAL

## 2023-11-07 NOTE — Progress Notes (Signed)
Occupational Therapy Treatment Patient Details Name: Don [name] MRN: [id number] DOB: [birth year] Today's Date: 11/07/2023   History of present illness Don [name] is a 78 y.o. male admitted 10/28/23 for chest pain and fever. EKG with supraventricular tachycardia with rate 158, converted back to NSR following treatment of fever with Tylenol and 1 L NS bolus. Limited echocardiogram was obtained which shows a preserved EF of 55 to 60% with no regional wall motion abnormalities. Of note, pt was recently admitted 1/3-1/9 for evaluation of syncope and this was felt to be due to orthostasis as he had been on Lasix and 1/18-1/21 for dehydration in the setting of poor oral intake. PMH of chronic systolic CHF, CKD stage IIIa, HTN, HLD, cirrhosis, gout, and hepatitis C.   OT comments  OT session focused on training in techniques for increased independence with UB ADLs and in B UE therapeutic exercises (see details below) to increased strength, activity tolerance, coordination, increased ROM in R UE, and ability to follow multi-step commands for carryover to functional tasks. Pt current demonstrates ability perform UB ADLs largely with Set up to Contact guard assist. VSS on RA throughout session. Pt participated well in session and is making progress toward goals. Pt will benefit from continued acute skilled OT services to address deficits outlined below and increase safety and independence with functional tasks. Post acute discharge, pt will benefit from intensive inpatient skilled rehab services < 3 hours per day to maximize rehab potential.       If plan is discharge home, recommend the following:  Two people to help with walking and/or transfers;A lot of help with bathing/dressing/bathroom;Assistance with cooking/housework;Assist for transportation;Help with stairs or ramp for entrance;Direct supervision/assist for medications management;Direct supervision/assist for financial management;Assistance with  feeding (set-up for self-feeding)   Equipment Recommendations  Other (comment) (defer to next level of care)    Recommendations for Other Services      Precautions / Restrictions Precautions Precautions: Fall Restrictions Weight Bearing Restrictions Per Provider Order: No       Mobility Bed Mobility Overal bed mobility: Needs Assistance             General bed mobility comments: Pt sitting in recliner at beginning and end of session    Transfers Overall transfer level: Needs assistance Equipment used: Rolling walker (2 wheels) Transfers: Sit to/from Stand Sit to Stand: Mod assist, Max assist           General transfer comment: Pt performed STS from recliner with Mod-Max assist to reposition in chair.     Balance Overall balance assessment: Needs assistance Sitting-balance support: No upper extremity supported, Feet supported Sitting balance-Leahy Scale: Fair     Standing balance support: Bilateral upper extremity supported, During functional activity, Reliant on assistive device for balance Standing balance-Leahy Scale: Poor Standing balance comment: Pt with heavy reliance on RW for balance and maintains fwd flex posture in standing.                           ADL either performed or assessed with clinical judgement   ADL Overall ADL's : Needs assistance/impaired Eating/Feeding: Set up;Sitting               Upper Body Dressing : Contact guard assist;Sitting (with increased time)                          Extremity/Trunk Assessment Upper Extremity Assessment Upper Extremity  Assessment: Right hand dominant;Generalized weakness;RUE deficits/detail;LUE deficits/detail RUE Deficits / Details: generalized weakness; decreased fine and gross motor coordination (worse on Left); noted edematous R elbow with decreased edema as compared to last skilled OT session; pain with AROM/PROM of shoulder and hand RUE Coordination: decreased gross  motor;decreased fine motor LUE Deficits / Details: generalized weakness; decreased fine and gross motor coordination (worse on Left); mildly edematous elbow with decreased edema as compared to last skilled OT session LUE Coordination: decreased gross motor;decreased fine motor   Lower Extremity Assessment Lower Extremity Assessment: Defer to PT evaluation        Vision       Perception     Praxis     Communication Communication Communication: No apparent difficulties   Cognition Arousal: Alert Behavior During Therapy: WFL for tasks assessed/performed Cognition: No family/caregiver present to determine baseline, Cognition impaired     Awareness: Intellectual awareness intact, Online awareness impaired Memory impairment (select all impairments): Short-term memory, Working [employer] fast streamer, Engineer, structural memory Attention impairment (select first level of impairment): Alternating attention Executive functioning impairment (select all impairments): Organization, Sequencing, Problem solving OT - Cognition Comments: Pt plesant throughout session.                 Following commands: Impaired Following commands impaired: Follows one step commands with increased time, Follows multi-step commands inconsistently, Follows multi-step commands with increased time      Cueing   Cueing Techniques: Verbal cues, Visual cues, Tactile cues  Exercises Exercises: General Upper Extremity, Hand exercises General Exercises - Upper Extremity Shoulder Flexion: AROM, Strengthening, 10 reps, Both, Seated, Limitations (increased activity tolerance; increased gross motor coordination; increased ROM on the R; increased ability to follow multi-step instructions) Shoulder Flexion Limitations: R shoulder AROM flexion to approx 75 degrees Shoulder Extension: AROM, Strengthening, Both, 10 reps, Seated (increased activity tolerance; increased gross motor coordination; increased ability to follow multi-step  instructions) Elbow Flexion: AROM, Strengthening, Both, 10 reps, Seated (increased activity tolerance; increased gross motor coordination; increased ability to follow multi-step instructions) Elbow Extension: AROM, Strengthening, Both, 10 reps, Seated (increased activity tolerance; increased gross motor coordination; increased ability to follow multi-step instructions) Wrist Flexion: AROM, Strengthening, Both, 10 reps, Seated (increased activity tolerance; increased gross motor coordination; increased ability to follow multi-step instructions) Wrist Extension: AROM, Strengthening, Both, 10 reps, Seated (increased activity tolerance; increased gross motor coordination; increased ability to follow multi-step instructions) Digit Composite Flexion: AROM, Strengthening, Both, 10 reps, Seated (increased activity tolerance; increased fine motor coordination; increased ROM on the R;  increased ability to follow multi-step instructions) Composite Extension: AROM, Strengthening, Both, 10 reps, Seated (increased activity tolerance; increased fine motor coordination; increased ROM on the R; increased ability to follow multi-step instructions) Chair Push Up: AROM, Both, Other reps (comment), Seated (2 reps) Hand Exercises Digit Lifts: AROM, Strengthening, Both, 10 reps, Seated (increased activity tolerance; increased fine motor coordination; increased ROM on the R; increased ability to follow multi-step instructions) Opposition: AROM, Strengthening, Both, 5 reps, Seated (increased activity tolerance; increased fine motor coordination; increased ROM on the R; increased ability to follow multi-step instructions)    Shoulder Instructions       General Comments VSS on RA throughout session.    Pertinent Vitals/ Pain       Pain Assessment Pain Assessment: Faces Faces Pain Scale: Hurts little more Pain Location: R shoulder and hand with AROM exercises Pain Descriptors / Indicators: Discomfort, Grimacing, Sore,  Other (Comment) (Stiffness (in hand)) Pain Intervention(s): Limited activity within patient's tolerance, Monitored  during session, Repositioned, Heat applied, Other (comment) (RN okayed trial of heating pad to R hand due to stiffness and pain)  Home Living                                          Prior Functioning/Environment              Frequency  Min 1X/week        Progress Toward Goals  OT Goals(current goals can now be found in the care plan section)  Progress towards OT goals: Progressing toward goals  Acute Rehab OT Goals Patient Stated Goal: to get stronger and be able to use his Right arm without pain  Plan      Co-evaluation                 AM-PAC OT "6 Clicks" Daily Activity     Outcome Measure   Help from another person eating meals?: A Little Help from another person taking care of personal grooming?: A Little Help from another person toileting, which includes using toliet, bedpan, or urinal?: A Lot Help from another person bathing (including washing, rinsing, drying)?: A Lot Help from another person to put on and taking off regular upper body clothing?: A Little Help from another person to put on and taking off regular lower body clothing?: A Lot 6 Click Score: 15    End of Session Equipment Utilized During Treatment: Rolling walker (2 wheels);Gait belt;Other (comment) (heat pack)  OT Visit Diagnosis: Muscle weakness (generalized) (M62.81);Ataxia, unspecified (R27.0);Other (comment) (decreased activity tolerance)   Activity Tolerance Patient tolerated treatment well   Patient Left in chair;with call bell/phone within reach;with chair alarm set   Nurse Communication Mobility status;Other (comment) (RN okayed use of heat pack to digits of R hand to address stiffness and pain.)        Time: 1445-1511 OT Time Calculation (min): 26 min  Charges: OT General Charges $OT Visit: 1 Visit OT Treatments $Self Care/Home  Management : 8-22 mins $Therapeutic Exercise: 8-22 mins  Honesty Menta "Orson Eva., OTR/L, MA Acute Rehab [phone]   Lendon Colonel 11/07/2023, 4:14 PM

## 2023-11-07 NOTE — Progress Notes (Signed)
Mobility Specialist Progress Note;   11/07/23 1020  Mobility  Activity Transferred from bed to chair  Level of Assistance +2 (takes two people) (ModA)  Location manager Ambulated (ft) 3 ft  Activity Response Tolerated well  Mobility Referral Yes  Mobility visit 1 Mobility  Mobility Specialist Start Time (ACUTE ONLY) 1020  Mobility Specialist Stop Time (ACUTE ONLY) 1025  Mobility Specialist Time Calculation (min) (ACUTE ONLY) 5 min   Pt agreeable to mobility. Required ModA +2 to transfer pt safely from bed to chair. VSS throughout and no c/o. Verbal cues required for foot progression during transfer. Pt left in chair with all needs met, alarm on.   Caesar Bookman Mobility Specialist Please contact via SecureChat or [employer] [phone]

## 2023-11-07 NOTE — TOC Progression Note (Addendum)
Transition of Care [facility]) - Progression Note    Patient Details  Name: Don [name] MRN: [id number] Date of Birth: June 02, 1946  Transition of Care [facility] CM/SW Contact  Delilah Shan, LCSWA Phone Number: 11/07/2023, 12:37 PM  Clinical Narrative:     Patients insurance authorization currently pending. Patient has SNF bed at [facility]. CSW will continue to follow.  Update- Patients insurance called CSW and requested additional clinicals for review for insurance determination. CSW faxed over additional clinicals to patients insurance. Patients insurance provided fax # [phone].  Expected Discharge Plan: Skilled Nursing Facility Barriers to Discharge: Continued Medical Work up  Expected Discharge Plan and Services In-house Referral: Clinical Social Work     Living arrangements for the past 2 months: Skilled Nursing Facility                                       Social Determinants of Health (SDOH) Interventions SDOH Screenings   Food Insecurity: Patient Unable To Answer (10/30/2023)  Housing: Low Risk  (10/14/2023)  Transportation Needs: No Transportation Needs (10/14/2023)  Utilities: Not At Risk (10/14/2023)  Social Connections: Unknown (10/31/2023)  Tobacco Use: Medium Risk (10/28/2023)    Readmission Risk Interventions    10/29/2023   10:25 AM 10/14/2023   11:02 AM  Readmission Risk Prevention Plan  Transportation Screening Complete Complete  HRI or Home Care Consult  Complete  Social Work Consult for Recovery Care Planning/Counseling  Complete  Palliative Care Screening  Not Applicable  Medication Review Oceanographer) Complete Complete  HRI or Home Care Consult Complete   SW Recovery Care/Counseling Consult Complete   Palliative Care Screening Not Applicable   Skilled Nursing Facility Complete

## 2023-11-07 NOTE — Progress Notes (Signed)
PROGRESS NOTE  Don [name] ZOX:[id number] DOB: [birth year]   PCP: Ponciano Ort [facility]  Patient is from: SNF.  Nonambulatory at baseline.  DOA: 10/28/2023 LOS: 10  Chief complaints Chief Complaint  Patient presents with   Chest Pain     Brief Narrative / Interim history: 78 year old M with PMH of HFimpEF, liver cirrhosis, hepatitis C, hypotension on midodrine, anemia, HTN, HLD and gout brought to [name] ED by EMS from [facility] on 2/2 due to acute onset substernal chest pain and fever.    In the ED, temp 101.8 F, HR 160, RR 20, BP 175/105, SpO2 95% on room air.  WBC 9.7, hemoglobin 8.3, platelet count 381.  CMP without significant finding.  Trop 77>597.  EKG notable for supraventricular tachycardia, rate 158. Lactic acid 1.2.  COVID/influenza/RSV PCR negative.  UA and CXR without significant finding. Blood cultures x 2 obtained.  Started on broad-spectrum antibiotics.  Cardiology consulted.  Admission requested for acute myocarditis.  Patient was evaluated by cardiology who recommended transfer to [facility] for further cardiac evaluation Cardiac MRI suggest this acute myocarditis.  Other differential diagnosis including sarcoidosis, and a small pericardial effusion.  Patient underwent R/LHC on 2/7 that showed mild LAD and proximal LCx lesion and high-grade ostial lesion of very small diagonal.  No stent placement.  SVT resolved.  Cardiology recommended medical management and signed off.  Continue to have mild intermittent fever without clear source.  Inflammatory markers elevated.  Initial RVP and blood culture negative. ID consulted but signed off.  Noted left forearm swelling and increased warmth to touch.  Started on p.o. cefadroxil for cellulitis.  Repeat RVP on 2/11 positive for RSV.    Failure to thrive picture.  Palliative medicine consulted.  CODE STATUS changed to DNR/DNI on 2/11.    Subjective: Seen and examined earlier this morning.  Spiked fever to 101.2  earlier this morning.  Continues to endorse cough.  Denies shortness of breath, chest pain, GI or UTI symptoms.  Objective: Vitals:   11/07/23 0318 11/07/23 0558 11/07/23 0738 11/07/23 0806  BP: 134/70 133/62  (!) 89/61  Pulse: 87 88  81  Resp: 18 18    Temp: (!) 101 F (38.3 C) (!) 101.2 F (38.4 C)    TempSrc: Oral Oral    SpO2: 96% 97% 96% 95%  Weight:      Height:        Examination:  GENERAL: No apparent distress.  Appears frail. HEENT: MMM.  Vision and hearing grossly intact.  NECK: Supple.  No apparent JVD.  RESP:  No IWOB.  Fair aeration bilaterally. CVS:  RRR. Heart sounds normal.  ABD/GI/GU: BS+. Abd soft, NTND.  MSK/EXT:  Moves extremities.  Right forearm cellulitis improved. SKIN: no apparent skin lesion or wound NEURO: Awake, alert and oriented appropriately.  No apparent focal neuro deficit. PSYCH: Somewhat withdrawn with flat affect.  Procedures:  12/7-R/LHC on 2/7 that showed mild LAD and proximal LCx lesion and high-grade ostial lesion of very small diagonal.  Microbiology summarized: 2/2-COVID-19, influenza and RSV PCR nonreactive 2/3-A 20 pathogen RVP nonreactive 2/2-blood cultures negative 2/11-repeat COVID-19 nonreactive 2/11-RVP positive for RSV  Assessment and plan: Acute myocarditis/suspected non-STEMI: Presented with acute substernal chest pain and fever.  Troponin 77>>> 1600>> 336 suggesting ACS.  Limited TTE without significant finding.  Infectious workup unrevealing.  Pro-Cal negative.  CRP elevated to 15>> 18.  ESR elevated to 90>> 104.  ANA normal.  Cardiac MRI concerning for acute myocarditis.  R/LHC as above. -Cardiology-no plan for steroid.  Medical management for CAD and palliative consult.   -Follow SPEP. -Continue low-dose aspirin   Supraventricular tachycardia: HR elevated to 160s on arrival.  History of BB intolerance.  Resolved.  -Continue Coreg.  Tolerating. -Optimize electrolytes. -Continue telemetry   RUE cellulitis: noted  RUE swelling with increased warmth to touch concerning for cellulitis that has improved after starting cefadroxil on 2/9. -Continue p.o. cefadroxil.  Cough/RSV infection-initially negative.  Repeat RVP on 2/11 positive for RSV.  No respiratory distress other than dry cough. -Patient for question -Supportive care with mucolytic's and antitussive  Intermittent fever/elevated inflammatory markers: Likely due to the above.  ID consulted but signed off.  CRP improved after starting cefadroxil for cellulitis.  Chronic HFimpEF: Limited TTE with LVEF of 55 to 60% (previously 20 to 25%)., no RWMA.  BNP elevated to 1600.  Appears euvolemic on exam.   Not on diuretics.  Cardiac MRI and R/LHC as above. -Continue Coreg as above -Did not tolerate GDMT in the past due to orthostatic hypotension. -Strict intake and output, daily weight and renal functions  History of gout without acute flareup: While he is unusually high dose colchicine.  He is also on a statin which could increase his risk of rhabdo.  CK and uric acid not elevated. -Decreased colchicine to 0.6 mg daily as needed. -Continue home allopurinol.  Acute on chronic anemia: Although Hgb dropped about 2 g in the last 24 hours, baseline ~8.0.  No overt bleeding.  Hemoccult negative.  Normal LDH and slightly elevated haptoglobin argues against hemolysis.  Blood smear normal.  Inflammatory markers markedly elevated.  H&H stable after 1 unit. Recent Labs    11/01/23 0439 11/01/23 1000 11/01/23 1415 11/02/23 0725 11/02/23 1531 11/02/23 1534 11/03/23 0318 11/04/23 0349 11/05/23 0408 11/06/23 0418  HGB 7.7* 7.6* 8.1* 7.6* 8.2*  7.8* 8.2* 7.7* 7.6* 9.1* 9.1*  -Monitor H&H.  Goal Hgb > 8.0 given CAD.  History of liver cirrhosis/history of hepatitis C: Compensated.  Not sure if he has been treated for hep C. -Follow hep C AB with reflex PCR -Outpatient follow-up with ID based on result  Euthyroid sick syndrome: Mild elevated TSH with normal  free T4. -Repeat in 4 to 6 weeks  CKD 3A ruled out.  Hyperlipidemia -Continue Crestor 5 mg p.o. daily  Bilateral leg pain: Exam reassuring except for left foot tenderness.  X-ray negative.  No signs of infection.  Resolved. -Tylenol as needed -Voltaren gel  Ambulatory dysfunction: Patient reports using chairs with wheels to get around.  -PT/OT eval  Hypomagnesemia -Monitor replenish as appropriate   GERD -Protonix 40 mg p.o. daily  Constipation: Resolved -Continue bowel regimen as needed  Physical deconditioning: Too deconditioned and frail.  Poor overall prognosis.  Still full code. -Palliative consulted.  CODE STATUS changed to DNR/DNI on 2/11.   Body mass index is 22.34 kg/m.           DVT prophylaxis:  enoxaparin (LOVENOX) injection 40 mg Start: 11/03/23 1600 Place and maintain sequential compression device Start: 11/01/23 1234  Code Status: DNR/DNI. Family Communication: None at bedside. Level of care: Med-Surg Status is: Inpatient Remains inpatient appropriate because: Non-STEMI/elevated troponin/myocarditis/cellulitis/RSV infection   Final disposition: Likely back to SNF Consultants:  Cardiology Infectious disease Palliative medicine  55 minutes with more than 50% spent in reviewing records, counseling patient/family and coordinating care.   Sch Meds:  Scheduled Meds:  allopurinol  150 mg Oral Daily   ascorbic acid  500 mg Oral Q breakfast   aspirin EC  81 mg Oral Daily   benzonatate  100 mg Oral TID   calcitRIOL  0.25 mcg Oral Daily   carvedilol  6.25 mg Oral BID WC   cefadroxil  1,000 mg Oral BID   cholecalciferol  1,000 Units Oral Daily   cyanocobalamin  1,000 mcg Intramuscular Once per day on Monday Friday   diclofenac Sodium  2 g Topical QID   enoxaparin (LOVENOX) injection  40 mg Subcutaneous Q24H   feeding supplement  237 mL Oral BID BM   ferrous sulfate  325 mg Oral Q breakfast   guaiFENesin  600 mg Oral BID   multivitamin with  minerals  1 tablet Oral Daily   pantoprazole  40 mg Oral Daily   rosuvastatin  5 mg Oral Daily   sodium chloride flush  3 mL Intravenous Q12H   spironolactone  12.5 mg Oral Daily   umeclidinium bromide  1 puff Inhalation Daily   Continuous Infusions:   PRN Meds:.acetaminophen, albuterol, colchicine, guaiFENesin-dextromethorphan, hydrALAZINE, nitroGLYCERIN, ondansetron **OR** ondansetron (ZOFRAN) IV, phenol, [EXPIRED] polyethylene glycol **FOLLOWED BY** polyethylene glycol, [COMPLETED] senna-docusate **FOLLOWED BY** senna-docusate, sodium chloride flush, traZODone  Antimicrobials: Anti-infectives (From admission, onward)    Start     Dose/Rate Route Frequency Ordered Stop   11/06/23 0000  cefadroxil (DURICEF) 500 MG capsule        1,000 mg Oral 2 times daily 11/06/23 0715 11/11/23 2359   11/04/23 1000  cefadroxil (DURICEF) capsule 1,000 mg        1,000 mg Oral 2 times daily 11/04/23 0743 11/11/23 0959   10/28/23 1930  vancomycin (VANCOCIN) IVPB 1000 mg/200 mL premix        1,000 mg 200 mL/hr over 60 Minutes Intravenous  Once 10/28/23 1922 10/28/23 2126   10/28/23 1930  ceFEPIme (MAXIPIME) 2 g in sodium chloride 0.9 % 100 mL IVPB        2 g 200 mL/hr over 30 Minutes Intravenous  Once 10/28/23 1922 10/28/23 2013        I have personally reviewed the following labs and images: CBC: Recent Labs  Lab 11/01/23 1415 11/02/23 0725 11/02/23 1531 11/02/23 1534 11/03/23 0318 11/04/23 0349 11/05/23 0408 11/06/23 0418  WBC 9.0 9.4  --   --  8.1 7.1 5.9 6.2  NEUTROABS 7.1  --   --   --   --   --   --   --   HGB 8.1* 7.6*   < > 8.2* 7.7* 7.6* 9.1* 9.1*  HCT 25.2* 23.6*   < > 24.0* 23.8* 23.6* 27.7* 27.7*  MCV 93.7 92.2  --   --  90.8 90.8 90.2 90.5  PLT 375 353  --   --  355 363 357 380   < > = values in this interval not displayed.   BMP &GFR Recent Labs  Lab 11/01/23 0439 11/02/23 0725 11/02/23 1531 11/02/23 1534 11/03/23 0318 11/04/23 0349 11/05/23 0408 11/06/23 0418   NA 140 137   < > 135 137 136 136 137  K 3.8 3.6   < > 3.5 3.8 3.9 3.8 3.9  CL 107 103  --   --  99 100 100 101  CO2 24 24  --   --  24 24 23 25   GLUCOSE 129* 94  --   --  93 89 93 100*  BUN 13 10  --   --  10 7* 8 7*  CREATININE 0.98 0.88  --   --  0.90 0.99 0.98 0.92  CALCIUM 8.9 8.8*  --   --  9.0 8.8* 9.3 9.2  MG 1.8  --   --   --  1.5* 1.7 1.6* 1.8  PHOS  --   --   --   --   --   --   --  3.5   < > = values in this interval not displayed.   Estimated Creatinine Clearance: 71 mL/min (by C-G formula based on SCr of 0.92 mg/dL). Liver & Pancreas: Recent Labs  Lab 11/01/23 0439 11/06/23 0418  AST 26  --   ALT 20  --   ALKPHOS 121  --   BILITOT 0.6  --   PROT 5.2*  --   ALBUMIN 1.7* 2.0*   No results for input(s): "LIPASE", "AMYLASE" in the last 168 hours. No results for input(s): "AMMONIA" in the last 168 hours. Diabetic: No results for input(s): "HGBA1C" in the last 72 hours. No results for input(s): "GLUCAP" in the last 168 hours. Cardiac Enzymes: Recent Labs  Lab 11/01/23 0439  CKTOTAL 23*   No results for input(s): "PROBNP" in the last 8760 hours. Coagulation Profile: No results for input(s): "INR", "PROTIME" in the last 168 hours.  Thyroid Function Tests: No results for input(s): "TSH", "T4TOTAL", "FREET4", "T3FREE", "THYROIDAB" in the last 72 hours.  Lipid Profile: No results for input(s): "CHOL", "HDL", "LDLCALC", "TRIG", "CHOLHDL", "LDLDIRECT" in the last 72 hours.  Anemia Panel: No results for input(s): "VITAMINB12", "FOLATE", "FERRITIN", "TIBC", "IRON", "RETICCTPCT" in the last 72 hours.  Urine analysis:    Component Value Date/Time   COLORURINE AMBER (A) 10/28/2023 1922   APPEARANCEUR CLEAR 10/28/2023 1922   LABSPEC 1.018 10/28/2023 1922   PHURINE 6.0 10/28/2023 1922   GLUCOSEU NEGATIVE 10/28/2023 1922   HGBUR NEGATIVE 10/28/2023 1922   BILIRUBINUR NEGATIVE 10/28/2023 1922   KETONESUR NEGATIVE 10/28/2023 1922   PROTEINUR NEGATIVE 10/28/2023  1922   UROBILINOGEN 1.0 02/01/2012 1300   NITRITE NEGATIVE 10/28/2023 1922   LEUKOCYTESUR NEGATIVE 10/28/2023 1922   Sepsis Labs: Invalid input(s): "PROCALCITONIN", "LACTICIDVEN"  Microbiology: Recent Results (from the past 240 hours)  Resp panel by RT-PCR (RSV, Flu A&B, Covid) Anterior Nasal Swab     Status: None   Collection Time: 10/28/23  7:22 PM   Specimen: Anterior Nasal Swab  Result Value Ref Range Status   SARS Coronavirus 2 by RT PCR NEGATIVE NEGATIVE Final    Comment: (NOTE) SARS-CoV-2 target nucleic acids are NOT DETECTED.  The SARS-CoV-2 RNA is generally detectable in upper respiratory specimens during the acute phase of infection. The lowest concentration of SARS-CoV-2 viral copies this assay can detect is 138 copies/mL. A negative result does not preclude SARS-Cov-2 infection and should not be used as the sole basis for treatment or other patient management decisions. A negative result may occur with  improper specimen collection/handling, submission of specimen other than nasopharyngeal swab, presence of viral mutation(s) within the areas targeted by this assay, and inadequate number of viral copies(<138 copies/mL). A negative result must be combined with clinical observations, patient history, and epidemiological information. The expected result is Negative.  Fact Sheet for Patients:  BloggerCourse.com  Fact Sheet for Healthcare Providers:  SeriousBroker.it  This test is no t yet approved or cleared by the [place] FDA and  has been authorized for detection and/or diagnosis of SARS-CoV-2 by FDA under an Emergency Use Authorization (EUA). This EUA will remain  in effect (meaning this test can be used) for the duration of  the COVID-19 declaration under Section 564(b)(1) of the Act, 21 U.S.C.section 360bbb-3(b)(1), unless the authorization is terminated  or revoked sooner.       Influenza A by PCR  NEGATIVE NEGATIVE Final   Influenza B by PCR NEGATIVE NEGATIVE Final    Comment: (NOTE) The Xpert Xpress SARS-CoV-2/FLU/RSV plus assay is intended as an aid in the diagnosis of influenza from Nasopharyngeal swab specimens and should not be used as a sole basis for treatment. Nasal washings and aspirates are unacceptable for Xpert Xpress SARS-CoV-2/FLU/RSV testing.  Fact Sheet for Patients: BloggerCourse.com  Fact Sheet for Healthcare Providers: SeriousBroker.it  This test is not yet approved or cleared by the [place] FDA and has been authorized for detection and/or diagnosis of SARS-CoV-2 by FDA under an Emergency Use Authorization (EUA). This EUA will remain in effect (meaning this test can be used) for the duration of the COVID-19 declaration under Section 564(b)(1) of the Act, 21 U.S.C. section 360bbb-3(b)(1), unless the authorization is terminated or revoked.     Resp Syncytial Virus by PCR NEGATIVE NEGATIVE Final    Comment: (NOTE) Fact Sheet for Patients: BloggerCourse.com  Fact Sheet for Healthcare Providers: SeriousBroker.it  This test is not yet approved or cleared by the [place] FDA and has been authorized for detection and/or diagnosis of SARS-CoV-2 by FDA under an Emergency Use Authorization (EUA). This EUA will remain in effect (meaning this test can be used) for the duration of the COVID-19 declaration under Section 564(b)(1) of the Act, 21 U.S.C. section 360bbb-3(b)(1), unless the authorization is terminated or revoked.  Performed at [facility], [address] [place], [place] [address]   Blood Culture (routine x 2)     Status: None   Collection Time: 10/28/23  7:33 PM   Specimen: Left Antecubital; Blood  Result Value Ref Range Status   Specimen Description LEFT ANTECUBITAL BLOOD  Final   Special Requests   Final    BOTTLES DRAWN AEROBIC AND  ANAEROBIC Blood Culture adequate volume   Culture   Final    NO GROWTH 5 DAYS Performed at [facility], [address] [place], [place] [address]    Report Status 11/02/2023 FINAL  Final  Blood Culture (routine x 2)     Status: None   Collection Time: 10/28/23  7:43 PM   Specimen: BLOOD RIGHT FOREARM  Result Value Ref Range Status   Specimen Description BLOOD RIGHT FOREARM BLOOD  Final   Special Requests   Final    BOTTLES DRAWN AEROBIC AND ANAEROBIC Blood Culture adequate volume   Culture   Final    NO GROWTH 5 DAYS Performed at [facility], [address], [place], [place] [address]    Report Status 11/02/2023 FINAL  Final  Respiratory (~20 pathogens) panel by PCR     Status: None   Collection Time: 10/29/23  5:20 PM   Specimen: Nasopharyngeal Swab; Respiratory  Result Value Ref Range Status   Adenovirus NOT DETECTED NOT DETECTED Final   Coronavirus 229E NOT DETECTED NOT DETECTED Final    Comment: (NOTE) The Coronavirus on the Respiratory Panel, DOES NOT test for the novel  Coronavirus (2019 nCoV)    Coronavirus HKU1 NOT DETECTED NOT DETECTED Final   Coronavirus NL63 NOT DETECTED NOT DETECTED Final   Coronavirus OC43 NOT DETECTED NOT DETECTED Final   Metapneumovirus NOT DETECTED NOT DETECTED Final   Rhinovirus / Enterovirus NOT DETECTED NOT DETECTED Final   Influenza A NOT DETECTED NOT DETECTED Final   Influenza B  NOT DETECTED NOT DETECTED Final   Parainfluenza Virus 1 NOT DETECTED NOT DETECTED Final   Parainfluenza Virus 2 NOT DETECTED NOT DETECTED Final   Parainfluenza Virus 3 NOT DETECTED NOT DETECTED Final   Parainfluenza Virus 4 NOT DETECTED NOT DETECTED Final   Respiratory Syncytial Virus NOT DETECTED NOT DETECTED Final   Bordetella pertussis NOT DETECTED NOT DETECTED Final   Bordetella Parapertussis NOT DETECTED NOT DETECTED Final   Chlamydophila pneumoniae NOT DETECTED NOT DETECTED Final   Mycoplasma pneumoniae NOT DETECTED NOT DETECTED Final    Comment:  Performed at [facility], [address], [place], [place] [address]  SARS Coronavirus 2 by RT PCR (hospital order, performed in [facility] hospital lab) *cepheid single result test* Anterior Nasal Swab     Status: None   Collection Time: 11/06/23 10:22 AM   Specimen: Anterior Nasal Swab  Result Value Ref Range Status   SARS Coronavirus 2 by RT PCR NEGATIVE NEGATIVE Final    Comment: Performed at [facility], [address], [place], [place] [address]  Respiratory (~20 pathogens) panel by PCR     Status: Abnormal   Collection Time: 11/06/23  1:15 PM   Specimen: Nasopharyngeal Swab; Respiratory  Result Value Ref Range Status   Adenovirus NOT DETECTED NOT DETECTED Final   Coronavirus 229E NOT DETECTED NOT DETECTED Final    Comment: (NOTE) The Coronavirus on the Respiratory Panel, DOES NOT test for the novel  Coronavirus (2019 nCoV)    Coronavirus HKU1 NOT DETECTED NOT DETECTED Final   Coronavirus NL63 NOT DETECTED NOT DETECTED Final   Coronavirus OC43 NOT DETECTED NOT DETECTED Final   Metapneumovirus NOT DETECTED NOT DETECTED Final   Rhinovirus / Enterovirus NOT DETECTED NOT DETECTED Final   Influenza A NOT DETECTED NOT DETECTED Final   Influenza B NOT DETECTED NOT DETECTED Final   Parainfluenza Virus 1 NOT DETECTED NOT DETECTED Final   Parainfluenza Virus 2 NOT DETECTED NOT DETECTED Final   Parainfluenza Virus 3 NOT DETECTED NOT DETECTED Final   Parainfluenza Virus 4 NOT DETECTED NOT DETECTED Final   Respiratory Syncytial Virus DETECTED (A) NOT DETECTED Final   Bordetella pertussis NOT DETECTED NOT DETECTED Final   Bordetella Parapertussis NOT DETECTED NOT DETECTED Final   Chlamydophila pneumoniae NOT DETECTED NOT DETECTED Final   Mycoplasma pneumoniae NOT DETECTED NOT DETECTED Final    Comment: Performed at [facility], [address] [place], [place] [address]    Radiology Studies: No results found.     Aastha Dayley T. Dwon [facility]  If 7PM-7AM,  please contact night-coverage [email or web address] 11/07/2023, 12:28 PM

## 2023-11-07 NOTE — Evaluation (Signed)
Clinical/Bedside Swallow Evaluation Patient Details  Name: Don [name] MRN: [id number] Date of Birth: January 01, 1946  Today's Date: 11/07/2023 Time: SLP Start Time (ACUTE ONLY): 1325 SLP Stop Time (ACUTE ONLY): 1335 SLP Time Calculation (min) (ACUTE ONLY): 10 min  Past Medical History:  Past Medical History:  Diagnosis Date   Aortic atherosclerosis (HCC)    Cardiomyopathy (HCC)    a. EF 20 to 25% by echo in 03/2019.   Cerebrovascular disease    Chronic HFrEF (heart failure with reduced ejection fraction) (HCC)    Chronic kidney disease, stage 3a (HCC)    Cirrhosis (HCC)    Essential hypertension    Gout    Hepatitis C    Hyperlipidemia    Past Surgical History:  Past Surgical History:  Procedure Laterality Date   HERNIA REPAIR Left    RIGHT/LEFT HEART CATH AND CORONARY ANGIOGRAPHY N/A 11/02/2023   Procedure: RIGHT/LEFT HEART CATH AND CORONARY ANGIOGRAPHY;  Surgeon: Orbie Pyo, MD;  Location: [facility] INVASIVE CV LAB;  Service: Cardiovascular;  Laterality: N/A;   HPI:  Patient is a 78 y.o. male with PMH: HFimpEF, liver cirrhosis, hepatitis C, hypotension on midodrine, anemia, HTN, HLD and gout brought to [name] ED by EMS from [facility] on 2/2 due to acute onset substernal chest pain and fever. He had a temp of 101.8 F in ED, CXR without significant finding, repeat RVP on 2/11 was positive for RSV. SLP swallow evaluation ordered secondary to concern of potential silent aspiration.    Assessment / Plan / Recommendation  Clinical Impression  Patient not currently presenting with clinical s/s of dysphagia as per this bedside swallow evaluation. Swallow initiation was timely and no overt s/s aspiration with consecutive sips of thin liquids (water). Patient has no h/o dysphagia and no complaints other than difficulty chewing secondary to limited dentition. SLP not recommending further skilled intervention. SLP Visit Diagnosis: Dysphagia, unspecified (R13.10)    Aspiration  Risk  No limitations    Diet Recommendation Regular;Thin liquid    Liquid Administration via: Cup;Straw Medication Administration: Whole meds with liquid Postural Changes: Seated upright at 90 degrees    Other  Recommendations Oral Care Recommendations: Oral care BID    Recommendations for follow up therapy are one component of a multi-disciplinary discharge planning process, led by the attending physician.  Recommendations may be updated based on patient status, additional functional criteria and insurance authorization.  Follow up Recommendations No SLP follow up      Assistance Recommended at Discharge    Functional Status Assessment Patient has not had a recent decline in their functional status  Frequency and Duration            Prognosis        Swallow Study   General Date of Onset: 11/06/23 HPI: Patient is a 78 y.o. male with PMH: HFimpEF, liver cirrhosis, hepatitis C, hypotension on midodrine, anemia, HTN, HLD and gout brought to [name] ED by EMS from [facility] on 2/2 due to acute onset substernal chest pain and fever. He had a temp of 101.8 F in ED, CXR without significant finding, repeat RVP on 2/11 was positive for RSV. SLP swallow evaluation ordered secondary to concern of potential silent aspiration. Type of Study: Bedside Swallow Evaluation Previous Swallow Assessment: none found Diet Prior to this Study: Regular;Thin liquids (Level 0) Temperature Spikes Noted: No Respiratory Status: Room air History of Recent Intubation: No Behavior/Cognition: Alert;Pleasant mood;Cooperative Oral Cavity Assessment: Within Functional Limits Oral Care  Completed by SLP: No Oral Cavity - Dentition: Edentulous;Missing dentition Vision: Functional for self-feeding Self-Feeding Abilities: Able to feed self Patient Positioning: Upright in chair Baseline Vocal Quality: Normal Volitional Swallow: Able to elicit    Oral/Motor/Sensory Function Overall Oral Motor/Sensory  Function: Within functional limits   Ice Chips     Thin Liquid Thin Liquid: Within functional limits Presentation: Straw    Nectar Thick     Honey Thick     Puree Puree: Not tested   Solid     Solid: Not tested     Angela Nevin, MA, CCC-SLP Speech Therapy

## 2023-11-07 NOTE — Plan of Care (Signed)

## 2023-11-08 DIAGNOSIS — Z515 Encounter for palliative care: Secondary | ICD-10-CM | POA: Diagnosis not present

## 2023-11-08 DIAGNOSIS — I409 Acute myocarditis, unspecified: Secondary | ICD-10-CM | POA: Diagnosis not present

## 2023-11-08 DIAGNOSIS — Z7189 Other specified counseling: Secondary | ICD-10-CM | POA: Diagnosis not present

## 2023-11-08 LAB — SEDIMENTATION RATE: Sed Rate: 74 mm/h — ABNORMAL HIGH (ref 0–16)

## 2023-11-08 LAB — COMPREHENSIVE METABOLIC PANEL
ALT: 25 U/L (ref 0–44)
AST: 27 U/L (ref 15–41)
Albumin: 2 g/dL — ABNORMAL LOW (ref 3.5–5.0)
Alkaline Phosphatase: 80 U/L (ref 38–126)
Anion gap: 11 (ref 5–15)
BUN: 7 mg/dL — ABNORMAL LOW (ref 8–23)
CO2: 24 mmol/L (ref 22–32)
Calcium: 9.1 mg/dL (ref 8.9–10.3)
Chloride: 99 mmol/L (ref 98–111)
Creatinine, Ser: 0.96 mg/dL (ref 0.61–1.24)
GFR, Estimated: >60 mL/min (ref >60)
Glucose, Bld: 86 mg/dL (ref 70–99)
Potassium: 3.8 mmol/L (ref 3.5–5.1)
Sodium: 134 mmol/L — ABNORMAL LOW (ref 135–145)
Total Bilirubin: 0.6 mg/dL (ref 0.0–1.2)
Total Protein: 5.6 g/dL — ABNORMAL LOW (ref 6.5–8.1)

## 2023-11-08 LAB — CBC
HCT: 28.1 % — ABNORMAL LOW (ref 39.0–52.0)
Hemoglobin: 9.2 g/dL — ABNORMAL LOW (ref 13.0–17.0)
MCH: 29.3 pg (ref 26.0–34.0)
MCHC: 32.7 g/dL (ref 30.0–36.0)
MCV: 89.5 fL (ref 80.0–100.0)
Platelets: 390 10*3/uL (ref 150–400)
RBC: 3.14 MIL/uL — ABNORMAL LOW (ref 4.22–5.81)
RDW: 15 % (ref 11.5–15.5)
WBC: 7.6 10*3/uL (ref 4.0–10.5)
nRBC: 0 % (ref 0.0–0.2)

## 2023-11-08 LAB — C-REACTIVE PROTEIN: CRP: 6.1 mg/dL — ABNORMAL HIGH (ref <1.0)

## 2023-11-08 LAB — MAGNESIUM: Magnesium: 1.5 mg/dL — ABNORMAL LOW (ref 1.7–2.4)

## 2023-11-08 MED ORDER — MAGNESIUM SULFATE 2 GM/50ML IV SOLN
2.0000 g | Freq: Once | INTRAVENOUS | Status: AC
  Administered 2023-11-08: 2 g via INTRAVENOUS
  Filled 2023-11-08: qty 50

## 2023-11-08 NOTE — TOC Progression Note (Signed)
Transition of Care [facility]) - Progression Note    Patient Details  Name: SPYRIDON HORNSTEIN MRN: [id number] Date of Birth: 09/19/1946  Transition of Care [facility]) CM/SW Contact  Delilah Shan, LCSWA Phone Number: 11/08/2023, 4:48 PM  Clinical Narrative:     Patients insurance authorization currently pending for SNF. CSW will continue to follow.   Expected Discharge Plan: Skilled Nursing Facility Barriers to Discharge: Continued Medical Work up  Expected Discharge Plan and Services In-house Referral: Clinical Social Work     Living arrangements for the past 2 months: Skilled Nursing Facility                                       Social Determinants of Health (SDOH) Interventions SDOH Screenings   Food Insecurity: Patient Unable To Answer (10/30/2023)  Housing: Low Risk  (10/14/2023)  Transportation Needs: No Transportation Needs (10/14/2023)  Utilities: Not At Risk (10/14/2023)  Social Connections: Unknown (10/31/2023)  Tobacco Use: Medium Risk (10/28/2023)    Readmission Risk Interventions    10/29/2023   10:25 AM 10/14/2023   11:02 AM  Readmission Risk Prevention Plan  Transportation Screening Complete Complete  HRI or Home Care Consult  Complete  Social Work Consult for Recovery Care Planning/Counseling  Complete  Palliative Care Screening  Not Applicable  Medication Review Oceanographer) Complete Complete  HRI or Home Care Consult Complete   SW Recovery Care/Counseling Consult Complete   Palliative Care Screening Not Applicable   Skilled Nursing Facility Complete

## 2023-11-08 NOTE — Progress Notes (Signed)
Physical Therapy Treatment Patient Details Name: Don [name] MRN: [id number] DOB: [birth year] Today's Date: 11/08/2023   History of Present Illness Don [name] is a 78 y.o. male admitted 10/28/23 for chest pain and fever. EKG with supraventricular tachycardia with rate 158, converted back to NSR following treatment of fever with Tylenol and 1 L NS bolus. Limited echocardiogram was obtained which shows a preserved EF of 55 to 60% with no regional wall motion abnormalities. Of note, pt was recently admitted 1/3-1/9 for evaluation of syncope and this was felt to be due to orthostasis as he had been on Lasix and 1/18-1/21 for dehydration in the setting of poor oral intake. PMH of chronic systolic CHF, CKD stage IIIa, HTN, HLD, cirrhosis, gout, and hepatitis C.    PT Comments  Focused session on transfer and standing mobility training. The pt remains limited by deficits in lower extremity strength and power along with reported bil plantar feet pain. He required modA to transfer to stand from an elevated surface each rep and modA to balance and manage his RW to take a few steps at a time. He fatigues quickly. He also displays poor eccentric control with transferring stand to sit, needing cues to not sit prematurely and to reach back prior to sitting. Will continue to follow acutely.     If plan is discharge home, recommend the following: A lot of help with bathing/dressing/bathroom;Help with stairs or ramp for entrance;Assistance with cooking/housework;Assist for transportation;Two people to help with walking and/or transfers   Can travel by private vehicle     Yes  Equipment Recommendations  Wheelchair (measurements PT);Wheelchair cushion (measurements PT) (If pt were to go Home would need 24/7 support, HHPT, and the above equipment.)    Recommendations for Other Services       Precautions / Restrictions Precautions Precautions: Fall Precaution/Restrictions Comments: watch HR and  BP Restrictions Weight Bearing Restrictions Per Provider Order: No     Mobility  Bed Mobility Overal bed mobility: Needs Assistance Bed Mobility: Supine to Sit     Supine to sit: HOB elevated, Used rails, Supervision     General bed mobility comments: Pt required increased time but was able to sit up on the L side of the bed,  bring BLE off EOB, and scoot to the edge with use of handrails. Supervision for safety    Transfers Overall transfer level: Needs assistance Equipment used: Rolling walker (2 wheels) Transfers: Sit to/from Stand, Bed to chair/wheelchair/BSC Sit to Stand: Mod assist, From elevated surface   Step pivot transfers: Mod assist       General transfer comment: EOB elevated. Provided pt with verbal, tactile, and visual cues to bring feet shoulder-width apart, place feet under him posteriorly, place at least one hand on bed to push up to stand, bring  "nose over toes" and gain momentum to stand via rocking trunk. Pt stood from elevated EOB 2x with modA to power up to stand each rep. ModA needed for balance and RW management with step pivot to L from bed to recliner. Cues and hand-over-hand assistance needed for pt to reach back with L hand to control his sit in the recliner. Pt often sits prematurely.    Ambulation/Gait Ambulation/Gait assistance: Mod assist Gait Distance (Feet): 2 Feet (x2 bouts of ~2 ft each bout) Assistive device: Rolling walker (2 wheels) Gait Pattern/deviations: Decreased step length - left, Decreased stride length, Trunk flexed, Shuffle, Step-to pattern Gait velocity: reduced Gait velocity interpretation: <1.31 ft/sec, indicative of household  ambulator   General Gait Details: Pt took slow, small, shuffling steps. The first bout, pt ambulated forwards and backwards at EOB. The second bout, pt stepping laterally and turned to sit in chair to the L. Pt required cues to push through his arms, advance his feet, and turn his RW as needed. ModA for  balance and RW management.   Stairs             Wheelchair Mobility     Tilt Bed    Modified Rankin (Stroke Patients Only)       Balance Overall balance assessment: Needs assistance Sitting-balance support: No upper extremity supported, Feet supported Sitting balance-Leahy Scale: Fair Sitting balance - Comments: Pt sat EOB with supervision.   Standing balance support: Bilateral upper extremity supported, During functional activity, Reliant on assistive device for balance Standing balance-Leahy Scale: Poor Standing balance comment: Pt relies on RW and external physical assistance when OOB and maintains fwd flex posture in standing.                            Communication Communication Communication: No apparent difficulties  Cognition Arousal: Alert Behavior During Therapy: WFL for tasks assessed/performed   PT - Cognitive impairments: No apparent impairments                       PT - Cognition Comments: follows cues slowly Following commands: Impaired Following commands impaired: Follows one step commands with increased time    Cueing Cueing Techniques: Verbal cues, Tactile cues, Visual cues  Exercises General Exercises - Lower Extremity Ankle Circles/Pumps: Seated, Both, AROM, 5 reps Long Arc Quad: Seated, Both, Strengthening, AROM, 5 reps Hip ABduction/ADduction: AROM, Both, 5 reps, Seated Hip Flexion/Marching: Seated, Both, Strengthening, 5 reps (x2 sets)    General Comments        Pertinent Vitals/Pain Pain Assessment Pain Assessment: Faces Faces Pain Scale: Hurts even more Pain Location: plantar aspects of feet Pain Descriptors / Indicators: Discomfort, Grimacing, Guarding Pain Intervention(s): Monitored during session, Limited activity within patient's tolerance, Repositioned    Home Living                          Prior Function            PT Goals (current goals can now be found in the care plan section)  Acute Rehab PT Goals Patient Stated Goal: Move better PT Goal Formulation: With patient Time For Goal Achievement: 11/14/23 Potential to Achieve Goals: Fair Progress towards PT goals: Progressing toward goals    Frequency    Min 1X/week      PT Plan      Co-evaluation              AM-PAC PT "6 Clicks" Mobility   Outcome Measure  Help needed turning from your back to your side while in a flat bed without using bedrails?: A Little Help needed moving from lying on your back to sitting on the side of a flat bed without using bedrails?: A Little Help needed moving to and from a bed to a chair (including a wheelchair)?: A Lot Help needed standing up from a chair using your arms (e.g., wheelchair or bedside chair)?: A Lot Help needed to walk in hospital room?: Total Help needed climbing 3-5 steps with a railing? : Total 6 Click Score: 12    End of Session Equipment Utilized During  Treatment: Gait belt Activity Tolerance: Patient limited by fatigue Patient left: in chair;with chair alarm set;with call bell/phone within reach Nurse Communication: Mobility status;Need for lift equipment (stedy) PT Visit Diagnosis: Unsteadiness on feet (R26.81);Other abnormalities of gait and mobility (R26.89);Muscle weakness (generalized) (M62.81);Pain;Difficulty in walking, not elsewhere classified (R26.2) Pain - Right/Left:  (bil) Pain - part of body: Ankle and joints of foot     Time: 1610-9604 PT Time Calculation (min) (ACUTE ONLY): 21 min  Charges:    $Therapeutic Activity: 8-22 mins PT General Charges $$ ACUTE PT VISIT: 1 Visit                     Virgil Benedict, PT, DPT Acute Rehabilitation Services  Office: [phone]    Bettina Gavia 11/08/2023, 3:01 PM

## 2023-11-08 NOTE — Progress Notes (Signed)
PROGRESS NOTE  Don [name] UJW:[id number] DOB: 1946/06/05   PCP: Ponciano Ort [facility]  Patient is from: SNF.  Nonambulatory at baseline.  DOA: 10/28/2023 LOS: 11  Chief complaints Chief Complaint  Patient presents with   Chest Pain     Brief Narrative / Interim history: 78 year old M with PMH of HFimpEF, liver cirrhosis, hepatitis C, hypotension on midodrine, anemia, HTN, HLD and gout brought to [name] ED by EMS from [facility] on 2/2 due to acute onset substernal chest pain and fever.    In the ED, temp 101.8 F, HR 160, RR 20, BP 175/105, SpO2 95% on room air.  WBC 9.7, hemoglobin 8.3, platelet count 381.  CMP without significant finding.  Trop 77>597.  EKG notable for supraventricular tachycardia, rate 158. Lactic acid 1.2.  COVID/influenza/RSV PCR negative.  UA and CXR without significant finding. Blood cultures x 2 obtained.  Started on broad-spectrum antibiotics.  Cardiology consulted.  Admission requested for acute myocarditis.  Patient was evaluated by cardiology who recommended transfer to [facility] for further cardiac evaluation Cardiac MRI suggest this acute myocarditis.  Other differential diagnosis including sarcoidosis, and a small pericardial effusion.  Patient underwent R/LHC on 2/7 that showed mild LAD and proximal LCx lesion and high-grade ostial lesion of very small diagonal.  No stent placement.  SVT resolved.  Cardiology recommended medical management and signed off.  Continue to have mild intermittent fever without clear source.  Inflammatory markers elevated.  Initial RVP and blood culture negative. ID consulted but signed off.  Noted left forearm swelling and increased warmth to touch.  Started on p.o. cefadroxil for cellulitis.  Repeat RVP on 2/11 positive for RSV.   Failure to thrive picture.  Palliative medicine consulted.  CODE STATUS changed to DNR/DNI on 2/11.  Medically stable for discharge.     Subjective: Seen and examined earlier  this morning.  No major events overnight of this morning.  Continues to endorse cough but improved.  Denies chest pain or shortness of breath.  Objective: Vitals:   11/07/23 2052 11/08/23 0600 11/08/23 0700 11/08/23 1100  BP: (!) 145/85 129/61 129/61 105/64  Pulse: 79 70 83 67  Resp: 20 18 18 19   Temp: 98.9 F (37.2 C) 98.5 F (36.9 C) 97.7 F (36.5 C) 98.2 F (36.8 C)  TempSrc: Oral Oral Oral Oral  SpO2: 99% 96% 97% 97%  Weight:      Height:        Examination:  GENERAL: No apparent distress.  Appears frail. HEENT: MMM.  Vision and hearing grossly intact.  NECK: Supple.  No apparent JVD.  RESP:  No IWOB.  Fair aeration bilaterally. CVS:  RRR. Heart sounds normal.  ABD/GI/GU: BS+. Abd soft, NTND.  MSK/EXT:  Moves extremities.  Right forearm cellulitis seems to have resolved. SKIN: no apparent skin lesion or wound NEURO: Awake, alert and oriented appropriately.  No apparent focal neuro deficit. PSYCH: Somewhat withdrawn with flat affect.  Procedures:  12/7-R/LHC on 2/7 that showed mild LAD and proximal LCx lesion and high-grade ostial lesion of very small diagonal.  Microbiology summarized: 2/2-COVID-19, influenza and RSV PCR nonreactive 2/3-A 20 pathogen RVP nonreactive 2/2-blood cultures negative 2/11-repeat COVID-19 nonreactive 2/11-RVP positive for RSV  Assessment and plan: Acute myocarditis/suspected non-STEMI: Presented with acute substernal chest pain and fever.  Troponin 77>>> 1600>> 336 suggesting ACS.  Limited TTE without significant finding.  Infectious workup unrevealing.  Pro-Cal negative.  CRP elevated to 15>> 18.  ESR elevated to 90>>  104.  ANA and SPEP normal.  Cardiac MRI concerning for acute myocarditis.  R/LHC as above. -Cardiology-no plan for steroid.  Medical management for CAD and palliative consult.   -Continue low-dose aspirin   Supraventricular tachycardia: HR elevated to 160s on arrival.  History of BB intolerance.  Resolved.  -Continue Coreg.   Tolerating. -Optimize electrolytes. -Continue telemetry   RUE cellulitis: Resolving. -Continue p.o. cefadroxil 2/9>>>  Cough/RSV infection-initially negative.  Repeat RVP on 2/11 positive for RSV.  No respiratory distress other than dry cough. -Supportive care with mucolytic's and antitussive  Intermittent fever/elevated inflammatory markers: Likely due to the above.  ID consulted but signed off.  Inflammatory markers improved.  Chronic HFimpEF: Limited TTE with LVEF of 55 to 60% (previously 20 to 25%)., no RWMA.  BNP elevated to 1600.  Appears euvolemic on exam.   Not on diuretics.  Cardiac MRI and R/LHC as above. -Continue Coreg as above -Did not tolerate GDMT in the past due to orthostatic hypotension. -Strict intake and output, daily weight and renal functions  History of gout without acute flareup: While he is unusually high dose colchicine.  He is also on a statin which could increase his risk of rhabdo.  CK and uric acid not elevated. -Decreased colchicine to 0.6 mg daily as needed. -Continue home allopurinol.  Acute on chronic anemia: Although Hgb dropped about 2 g in the last 24 hours, baseline ~8.0.  No overt bleeding.  Hemoccult negative.  Normal LDH and slightly elevated haptoglobin argues against hemolysis.  Blood smear normal.  Inflammatory markers markedly elevated.  H&H stable after 1 unit. Recent Labs    11/01/23 1000 11/01/23 1415 11/02/23 0725 11/02/23 1531 11/02/23 1534 11/03/23 0318 11/04/23 0349 11/05/23 0408 11/06/23 0418 11/08/23 0458  HGB 7.6* 8.1* 7.6* 8.2*  7.8* 8.2* 7.7* 7.6* 9.1* 9.1* 9.2*  -Monitor H&H.  Goal Hgb > 8.0 given CAD.  History of liver cirrhosis/history of hepatitis C: Compensated.  Not sure if he has been treated for hep C. -Follow hep C AB with reflex PCR -Outpatient follow-up with ID based on result  Euthyroid sick syndrome: Mild elevated TSH with normal free T4. -Repeat in 4 to 6 weeks  CKD 3A ruled  out.  Hyperlipidemia -Continue Crestor 5 mg p.o. daily  Bilateral leg pain:  X-ray negative.  No signs of infection.  Resolved. -Tylenol as needed -Voltaren gel  Ambulatory dysfunction: Patient reports using chairs with wheels to get around.  -PT/OT eval  Hypomagnesemia -Monitor replenish as appropriate   GERD -Protonix 40 mg p.o. daily  Constipation: Resolved -Continue bowel regimen as needed  Physical deconditioning: Too deconditioned and frail.  Poor overall prognosis.  Still full code. -Palliative consulted.  CODE STATUS changed to DNR/DNI on 2/11.   Body mass index is 22.34 kg/m.           DVT prophylaxis:  enoxaparin (LOVENOX) injection 40 mg Start: 11/03/23 1600 Place and maintain sequential compression device Start: 11/01/23 1234  Code Status: DNR/DNI. Family Communication: None at bedside. Level of care: Med-Surg Status is: Inpatient Remains inpatient appropriate because: SNF bed   Final disposition: Likely back to SNF when they can take him back Consultants:  Cardiology Infectious disease Palliative medicine  35 minutes with more than 50% spent in reviewing records, counseling patient/family and coordinating care.   Sch Meds:  Scheduled Meds:  allopurinol  150 mg Oral Daily   ascorbic acid  500 mg Oral Q breakfast   aspirin EC  81 mg Oral Daily  benzonatate  100 mg Oral TID   calcitRIOL  0.25 mcg Oral Daily   carvedilol  6.25 mg Oral BID WC   cefadroxil  1,000 mg Oral BID   cholecalciferol  1,000 Units Oral Daily   cyanocobalamin  1,000 mcg Intramuscular Once per day on Monday Friday   diclofenac Sodium  2 g Topical QID   enoxaparin (LOVENOX) injection  40 mg Subcutaneous Q24H   feeding supplement  237 mL Oral BID BM   ferrous sulfate  325 mg Oral Q breakfast   guaiFENesin  600 mg Oral BID   multivitamin with minerals  1 tablet Oral Daily   pantoprazole  40 mg Oral Daily   rosuvastatin  5 mg Oral Daily   sodium chloride flush  3 mL  Intravenous Q12H   spironolactone  12.5 mg Oral Daily   umeclidinium bromide  1 puff Inhalation Daily   Continuous Infusions:   PRN Meds:.acetaminophen, albuterol, colchicine, guaiFENesin-dextromethorphan, hydrALAZINE, nitroGLYCERIN, ondansetron **OR** ondansetron (ZOFRAN) IV, phenol, [EXPIRED] polyethylene glycol **FOLLOWED BY** polyethylene glycol, [COMPLETED] senna-docusate **FOLLOWED BY** senna-docusate, sodium chloride flush, traZODone  Antimicrobials: Anti-infectives (From admission, onward)    Start     Dose/Rate Route Frequency Ordered Stop   11/06/23 0000  cefadroxil (DURICEF) 500 MG capsule        1,000 mg Oral 2 times daily 11/06/23 0715 11/11/23 2359   11/04/23 1000  cefadroxil (DURICEF) capsule 1,000 mg        1,000 mg Oral 2 times daily 11/04/23 0743 11/11/23 0959   10/28/23 1930  vancomycin (VANCOCIN) IVPB 1000 mg/200 mL premix        1,000 mg 200 mL/hr over 60 Minutes Intravenous  Once 10/28/23 1922 10/28/23 2126   10/28/23 1930  ceFEPIme (MAXIPIME) 2 g in sodium chloride 0.9 % 100 mL IVPB        2 g 200 mL/hr over 30 Minutes Intravenous  Once 10/28/23 1922 10/28/23 2013        I have personally reviewed the following labs and images: CBC: Recent Labs  Lab 11/03/23 0318 11/04/23 0349 11/05/23 0408 11/06/23 0418 11/08/23 0458  WBC 8.1 7.1 5.9 6.2 7.6  HGB 7.7* 7.6* 9.1* 9.1* 9.2*  HCT 23.8* 23.6* 27.7* 27.7* 28.1*  MCV 90.8 90.8 90.2 90.5 89.5  PLT 355 363 357 380 390   BMP &GFR Recent Labs  Lab 11/03/23 0318 11/04/23 0349 11/05/23 0408 11/06/23 0418 11/08/23 0458  NA 137 136 136 137 134*  K 3.8 3.9 3.8 3.9 3.8  CL 99 100 100 101 99  CO2 24 24 23 25 24   GLUCOSE 93 89 93 100* 86  BUN 10 7* 8 7* 7*  CREATININE 0.90 0.99 0.98 0.92 0.96  CALCIUM 9.0 8.8* 9.3 9.2 9.1  MG 1.5* 1.7 1.6* 1.8 1.5*  PHOS  --   --   --  3.5  --    Estimated Creatinine Clearance: 68.1 mL/min (by C-G formula based on SCr of 0.96 mg/dL). Liver & Pancreas: Recent Labs   Lab 11/06/23 0418 11/08/23 0458  AST  --  27  ALT  --  25  ALKPHOS  --  80  BILITOT  --  0.6  PROT  --  5.6*  ALBUMIN 2.0* 2.0*   No results for input(s): "LIPASE", "AMYLASE" in the last 168 hours. No results for input(s): "AMMONIA" in the last 168 hours. Diabetic: No results for input(s): "HGBA1C" in the last 72 hours. No results for input(s): "GLUCAP" in the last 168 hours.  Cardiac Enzymes: No results for input(s): "CKTOTAL", "CKMB", "CKMBINDEX", "TROPONINI" in the last 168 hours.  No results for input(s): "PROBNP" in the last 8760 hours. Coagulation Profile: No results for input(s): "INR", "PROTIME" in the last 168 hours.  Thyroid Function Tests: No results for input(s): "TSH", "T4TOTAL", "FREET4", "T3FREE", "THYROIDAB" in the last 72 hours.  Lipid Profile: No results for input(s): "CHOL", "HDL", "LDLCALC", "TRIG", "CHOLHDL", "LDLDIRECT" in the last 72 hours.  Anemia Panel: No results for input(s): "VITAMINB12", "FOLATE", "FERRITIN", "TIBC", "IRON", "RETICCTPCT" in the last 72 hours.  Urine analysis:    Component Value Date/Time   COLORURINE AMBER (A) 10/28/2023 1922   APPEARANCEUR CLEAR 10/28/2023 1922   LABSPEC 1.018 10/28/2023 1922   PHURINE 6.0 10/28/2023 1922   GLUCOSEU NEGATIVE 10/28/2023 1922   HGBUR NEGATIVE 10/28/2023 1922   BILIRUBINUR NEGATIVE 10/28/2023 1922   KETONESUR NEGATIVE 10/28/2023 1922   PROTEINUR NEGATIVE 10/28/2023 1922   UROBILINOGEN 1.0 02/01/2012 1300   NITRITE NEGATIVE 10/28/2023 1922   LEUKOCYTESUR NEGATIVE 10/28/2023 1922   Sepsis Labs: Invalid input(s): "PROCALCITONIN", "LACTICIDVEN"  Microbiology: Recent Results (from the past 240 hours)  Respiratory (~20 pathogens) panel by PCR     Status: None   Collection Time: 10/29/23  5:20 PM   Specimen: Nasopharyngeal Swab; Respiratory  Result Value Ref Range Status   Adenovirus NOT DETECTED NOT DETECTED Final   Coronavirus 229E NOT DETECTED NOT DETECTED Final    Comment: (NOTE) The  Coronavirus on the Respiratory Panel, DOES NOT test for the novel  Coronavirus (2019 nCoV)    Coronavirus HKU1 NOT DETECTED NOT DETECTED Final   Coronavirus NL63 NOT DETECTED NOT DETECTED Final   Coronavirus OC43 NOT DETECTED NOT DETECTED Final   Metapneumovirus NOT DETECTED NOT DETECTED Final   Rhinovirus / Enterovirus NOT DETECTED NOT DETECTED Final   Influenza A NOT DETECTED NOT DETECTED Final   Influenza B NOT DETECTED NOT DETECTED Final   Parainfluenza Virus 1 NOT DETECTED NOT DETECTED Final   Parainfluenza Virus 2 NOT DETECTED NOT DETECTED Final   Parainfluenza Virus 3 NOT DETECTED NOT DETECTED Final   Parainfluenza Virus 4 NOT DETECTED NOT DETECTED Final   Respiratory Syncytial Virus NOT DETECTED NOT DETECTED Final   Bordetella pertussis NOT DETECTED NOT DETECTED Final   Bordetella Parapertussis NOT DETECTED NOT DETECTED Final   Chlamydophila pneumoniae NOT DETECTED NOT DETECTED Final   Mycoplasma pneumoniae NOT DETECTED NOT DETECTED Final    Comment: Performed at [facility], [address], [place], [place] [address]  SARS Coronavirus 2 by RT PCR (hospital order, performed in [facility] hospital lab) *cepheid single result test* Anterior Nasal Swab     Status: None   Collection Time: 11/06/23 10:22 AM   Specimen: Anterior Nasal Swab  Result Value Ref Range Status   SARS Coronavirus 2 by RT PCR NEGATIVE NEGATIVE Final    Comment: Performed at [facility], [address] [place], [place] [address]  Respiratory (~20 pathogens) panel by PCR     Status: Abnormal   Collection Time: 11/06/23  1:15 PM   Specimen: Nasopharyngeal Swab; Respiratory  Result Value Ref Range Status   Adenovirus NOT DETECTED NOT DETECTED Final   Coronavirus 229E NOT DETECTED NOT DETECTED Final    Comment: (NOTE) The Coronavirus on the Respiratory Panel, DOES NOT test for the novel  Coronavirus (2019 nCoV)    Coronavirus HKU1 NOT DETECTED NOT DETECTED Final   Coronavirus NL63 NOT  DETECTED NOT DETECTED Final   Coronavirus  OC43 NOT DETECTED NOT DETECTED Final   Metapneumovirus NOT DETECTED NOT DETECTED Final   Rhinovirus / Enterovirus NOT DETECTED NOT DETECTED Final   Influenza A NOT DETECTED NOT DETECTED Final   Influenza B NOT DETECTED NOT DETECTED Final   Parainfluenza Virus 1 NOT DETECTED NOT DETECTED Final   Parainfluenza Virus 2 NOT DETECTED NOT DETECTED Final   Parainfluenza Virus 3 NOT DETECTED NOT DETECTED Final   Parainfluenza Virus 4 NOT DETECTED NOT DETECTED Final   Respiratory Syncytial Virus DETECTED (A) NOT DETECTED Final   Bordetella pertussis NOT DETECTED NOT DETECTED Final   Bordetella Parapertussis NOT DETECTED NOT DETECTED Final   Chlamydophila pneumoniae NOT DETECTED NOT DETECTED Final   Mycoplasma pneumoniae NOT DETECTED NOT DETECTED Final    Comment: Performed at [facility], [address] [place], [place] [address]    Radiology Studies: No results found.     Zaryah Seckel T. Usama Harkless [facility]  If 7PM-7AM, please contact night-coverage [email or web address] 11/08/2023, 2:48 PM

## 2023-11-09 DIAGNOSIS — I409 Acute myocarditis, unspecified: Secondary | ICD-10-CM | POA: Diagnosis not present

## 2023-11-09 MED ORDER — CEFADROXIL 500 MG PO CAPS: 1000.0000 mg | ORAL_CAPSULE | Freq: Two times a day (BID) | ORAL | Status: AC

## 2023-11-09 NOTE — Plan of Care (Signed)

## 2023-11-09 NOTE — TOC Progression Note (Signed)
Transition of Care [facility]) - Progression Note    Patient Details  Name: Don [name] MRN: [id number] Date of Birth: [birth year]  Transition of Care [facility]) CM/SW Contact  Delilah Shan, LCSWA Phone Number: 11/09/2023, 11:59 AM  Clinical Narrative:     CSW spoke with patients insurance to check on status of insurance authorization appeal for SNF. Patients insurance confirmed that patients insurance authorization appeal for SNF has been approved. #[id number]. CSW informed Debbie with [facility]. Debbie with [facility] confirmed patient can dc over today. CSW informed MD. CSW will continue to follow.  Expected Discharge Plan: Skilled Nursing Facility Barriers to Discharge: Continued Medical Work up  Expected Discharge Plan and Services In-house Referral: Clinical Social Work     Living arrangements for the past 2 months: Skilled Nursing Facility                                       Social Determinants of Health (SDOH) Interventions SDOH Screenings   Food Insecurity: Patient Unable To Answer (10/30/2023)  Housing: Low Risk  (10/14/2023)  Transportation Needs: No Transportation Needs (10/14/2023)  Utilities: Not At Risk (10/14/2023)  Social Connections: Unknown (10/31/2023)  Tobacco Use: Medium Risk (10/28/2023)    Readmission Risk Interventions    10/29/2023   10:25 AM 10/14/2023   11:02 AM  Readmission Risk Prevention Plan  Transportation Screening Complete Complete  HRI or Home Care Consult  Complete  Social Work Consult for Recovery Care Planning/Counseling  Complete  Palliative Care Screening  Not Applicable  Medication Review Oceanographer) Complete Complete  HRI or Home Care Consult Complete   SW Recovery Care/Counseling Consult Complete   Palliative Care Screening Not Applicable   Skilled Nursing Facility Complete

## 2023-11-09 NOTE — Care Management Important Message (Signed)
Important Message  Patient Details  Name: Don [name] MRN: [id number] Date of Birth: 12-12-1945   Important Message Given:  Yes - Medicare IM     Renie Ora 11/09/2023, 11:24 AM

## 2023-11-09 NOTE — Progress Notes (Signed)
Transport here for patient. VSS no further needs at this time. Report to facility was called by day rn.

## 2023-11-09 NOTE — Discharge Summary (Signed)
 Physician Discharge Summary  Don [name]:[id number] DOB: Feb 20, 1946 DOA: 10/28/2023  PCP: Ponciano Ort [facility]  Admit date: 10/28/2023 Discharge date: 11/09/2023 30 Day Unplanned Readmission Risk Score    Flowsheet Row ED to Hosp-Admission (Current) from 10/28/2023 in [facility]  30 Day Unplanned Readmission Risk Score (%) 39.69 Filed at 11/09/2023 1200       This score is the patient's risk of an unplanned readmission within 30 days of being discharged (0 -100%). The score is based on dignosis, age, lab data, medications, orders, and past utilization.   Low:  0-14.9   Medium: 15-21.9   High: 22-29.9   Extreme: 30 and above          Admitted From: SNF Disposition: SNF  Recommendations for Outpatient Follow-up:  Follow up with PCP in 1-2 weeks Please obtain BMP/CBC in one week Please follow up with your PCP on the following pending results: Unresulted Labs (From admission, onward)    None         Home Health: None none Equipment/Devices: None  Discharge Condition: Stable CODE STATUS: DNR, Diet recommendation: Cardiac  Subjective: Seen and examined, no complaints.  Brief/Interim Summary: 78 year old M with PMH of HFimpEF, liver cirrhosis, hepatitis C, hypotension on midodrine, anemia, HTN, HLD and gout brought to [name] ED by EMS from [facility] on 2/2 due to acute onset substernal chest pain and fever.     In the ED, temp 101.8 F, HR 160, RR 20, BP 175/105, SpO2 95% on room air.  WBC 9.7, hemoglobin 8.3, platelet count 381.  CMP without significant finding.  Trop 77>597.  EKG notable for supraventricular tachycardia, rate 158. Lactic acid 1.2.  COVID/influenza/RSV PCR negative.  UA and CXR without significant finding. Blood cultures x 2 obtained.  Started on broad-spectrum antibiotics.  Cardiology consulted.  Admission requested for acute myocarditis.   Patient was evaluated by cardiology who recommended transfer to [facility] for  further cardiac evaluation Cardiac MRI suggest acute myocarditis.  Other differential diagnosis including sarcoidosis, and a small pericardial effusion.  Patient underwent R/LHC on 2/7 that showed mild LAD and proximal LCx lesion and high-grade ostial lesion of very small diagonal.  No stent placement.  SVT resolved.  Cardiology recommended medical management and signed off.   Continue to have mild intermittent fever without clear source.  Inflammatory markers elevated.  Initial RVP and blood culture negative. ID consulted but signed off.  Noted left forearm swelling and increased warmth to touch.  Started on p.o. cefadroxil for cellulitis.  Repeat RVP on 2/11 positive for RSV.    Failure to thrive picture.  Palliative medicine consulted.  CODE STATUS changed to DNR/DNI on 2/11.  Further details below.   Acute myocarditis/suspected non-STEMI: Presented with acute substernal chest pain and fever.  Troponin 77>>> 1600>> 336 suggesting ACS.  Limited TTE without significant finding.  Infectious workup unrevealing.  Pro-Cal negative.  CRP elevated to 15>> 18.  ESR elevated to 90>> 104.  ANA and SPEP normal.  Cardiac MRI concerning for acute myocarditis.  R/LHC as above. -Cardiology-no plan for steroid.  Medical management for CAD and palliative consult.   -Continue low-dose aspirin   Supraventricular tachycardia: HR elevated to 160s on arrival.  History of BB intolerance.  Resolved.  -Started on Coreg.  Tolerating.   RUE cellulitis: Almost resolved. -Continue p.o. cefadroxil 2/9>>> will discharge on 2 more days.   Cough/RSV infection-initially negative.  Repeat RVP on 2/11 positive for RSV.  No respiratory distress.  Treated with supportive care.   Chronic HFimpEF: Limited TTE with LVEF of 55 to 60% (previously 20 to 25%)., no RWMA.  BNP elevated to 1600.  Appears euvolemic on exam.   Not on diuretics.  Cardiac MRI and R/LHC as above. -Continue Coreg as above -Did not tolerate GDMT in the past due to  orthostatic hypotension. -Strict intake and output, daily weight and renal functions   History of gout without acute flareup: While he is unusually high dose colchicine.  He is also on a statin which could increase his risk of rhabdo.  CK and uric acid not elevated. -Decreased colchicine to 0.6 mg daily as needed. -Continue home allopurinol.   Acute on chronic anemia: Although Hgb dropped about 2 g in the last 24 hours, baseline ~8.0.  No overt bleeding.  Hemoccult negative.  Normal LDH and slightly elevated haptoglobin argues against hemolysis.  Blood smear normal.  Inflammatory markers markedly elevated.  H&H stable after 1 unit.   History of liver cirrhosis/history of hepatitis C: Compensated.  Not sure if he has been treated for hep C. -Follow hep C AB with reflex PCR -Outpatient follow-up with ID based on result   Euthyroid sick syndrome: Mild elevated TSH with normal free T4. -Repeat in 4 to 6 weeks   CKD 3A ruled out.   Hyperlipidemia -Continue Crestor 5 mg p.o. daily   Bilateral leg pain:  X-ray negative.  No signs of infection.  Resolved. -Tylenol as needed -Voltaren gel   Physical deconditioning.  Ambulatory dysfunction/goal of care: Seen by PT, SNF recommended.Palliative consulted.  CODE STATUS changed to DNR/DNI on 2/11.   Hypomagnesemia -Resolved.   GERD -Protonix 40 mg p.o. daily   Procedures:  12/7-R/LHC on 2/7 that showed mild LAD and proximal LCx lesion and high-grade ostial lesion of very small diagonal.  Discharge plan was discussed with patient and/or family member and they verbalized understanding and agreed with it.  Discharge Diagnoses:  Principal Problem:   Acute myocarditis Active Problems:   Dyslipidemia   GERD without esophagitis   Gout   HFrEF (heart failure with reduced ejection fraction) (HCC)   Non-ST elevation (NSTEMI) myocardial infarction [facility])    Discharge Instructions  Discharge Instructions     AMB referral to Phase II Cardiac  Rehabilitation   Complete by: As directed    Diagnosis: NSTEMI   After initial evaluation and assessments completed: Virtual Based Care may be provided alone or in conjunction with Phase 2 Cardiac Rehab based on patient barriers.: Yes   Intensive Cardiac Rehabilitation (ICR) [facility] location only OR Traditional Cardiac Rehabilitation (TCR) *If criteria for ICR are not met will enroll in TCR [facility] only): Yes   Diet - low sodium heart healthy   Complete by: As directed    Increase activity slowly   Complete by: As directed       Allergies as of 11/09/2023       Reactions   Penicillins Swelling        Medication List     STOP taking these medications    midodrine 5 MG tablet Commonly known as: PROAMATINE   ondansetron 4 MG tablet Commonly known as: ZOFRAN       TAKE these medications    ACETAMINOPHEN ER PO Take 325 mg by mouth every 4 (four) hours as needed (general discomfort, elevated temp).   albuterol 108 (90 Base) MCG/ACT inhaler Commonly known as: VENTOLIN HFA Inhale 2 puffs into the lungs every 4 (four)  hours as needed for wheezing or shortness of breath.   albuterol (2.5 MG/3ML) 0.083% nebulizer solution Commonly known as: PROVENTIL Take 3 mLs (2.5 mg total) by nebulization every 4 (four) hours as needed for wheezing or shortness of breath.   allopurinol 100 MG tablet Commonly known as: Zyloprim Take 1.5 tablets (150 mg total) by mouth daily.   ascorbic acid 500 MG tablet Commonly known as: VITAMIN C Take 1 tablet (500 mg total) by mouth daily with breakfast.   aspirin EC 81 MG tablet Take 81 mg by mouth daily.   calcitRIOL 0.25 MCG capsule Commonly known as: ROCALTROL Take 0.25 mcg by mouth daily.   carvedilol 6.25 MG tablet Commonly known as: COREG Take 1 tablet (6.25 mg total) by mouth 2 (two) times daily with a meal. What changed:  medication strength how much to take   cefadroxil 500 MG capsule Commonly known as: DURICEF Take 2 capsules  (1,000 mg total) by mouth 2 (two) times daily for 5 days.   cholecalciferol 25 MCG (1000 UNIT) tablet Commonly known as: VITAMIN D3 Take 1,000 Units by mouth daily.   colchicine 0.6 MG tablet Take 1 tablet (0.6 mg total) by mouth daily as needed (gout flare). What changed:  how much to take when to take this   cyanocobalamin 1000 MCG/ML injection Commonly known as: VITAMIN B12 Inject 1 mL (1,000 mcg total) into the muscle See admin instructions. Inject into the muscle twice weekly on Monday, Friday for 4 weeks. Then inject once every 30 days starting on 12/08/23.   feeding supplement Liqd Take 237 mLs by mouth 2 (two) times daily between meals.   ferrous sulfate 325 (65 FE) MG tablet Take 1 tablet (325 mg total) by mouth daily with breakfast.   multivitamin with minerals Tabs tablet Take 1 tablet by mouth daily.   pantoprazole 40 MG tablet Commonly known as: PROTONIX Take 1 tablet (40 mg total) by mouth daily.   polyethylene glycol 17 g packet Commonly known as: MIRALAX / GLYCOLAX Take 17 g by mouth 2 (two) times daily as needed for mild constipation.   rosuvastatin 5 MG tablet Commonly known as: CRESTOR Take 5 mg by mouth daily.   senna-docusate 8.6-50 MG tablet Commonly known as: Senokot-S Take 1 tablet by mouth 2 (two) times daily as needed for moderate constipation.   Spiriva Respimat 2.5 MCG/ACT Aers Generic drug: Tiotropium Bromide Monohydrate Inhale 2 puffs into the lungs daily.   spironolactone 25 MG tablet Commonly known as: ALDACTONE Take 0.5 tablets (12.5 mg total) by mouth daily.   traZODone 50 MG tablet Commonly known as: DESYREL Take 0.5 tablets (25 mg total) by mouth at bedtime as needed for sleep.        Follow-up Information     [facility] Follow up in 1 week(s).   Contact information: [address] [address] [phone]                Allergies  Allergen Reactions   Penicillins  Swelling    Consultations: ID, cardiology, palliative care   Procedures/Studies: DG Chest Port 1 View Result Date: 11/06/2023 CLINICAL DATA:  Cough EXAM: PORTABLE CHEST 1 VIEW COMPARISON:  10/28/2023 FINDINGS: Mild enlargement of the cardiopericardial silhouette. Unusual pattern of lucency over the cardiac shadow, cannot exclude pneumopericardium, chest CT recommended. Possibility of pleural fluid tracking in the major fissure is a differential diagnostic consideration. Bandlike opacity at the left lung base probably from atelectasis. Borderline elevation  of the right hemidiaphragm. IMPRESSION: 1. Unusual pattern of lucency over the cardiac shadow, I favor this as being due to fluid layering in the major fissure and along the left costophrenic angle, less likely to be from pneumopericardium. I do not see signs of pneumopericardium on the prior cardiac MRI of 10/31/2023. This could be further assessed with chest CT if clinically indicated. 2. Bandlike opacity at the left lung base probably from atelectasis. 3. Borderline elevation of the right hemidiaphragm. Electronically Signed   By: Gaylyn Rong M.D.   On: 11/06/2023 14:34   CARDIAC CATHETERIZATION Result Date: 11/02/2023   Mid LAD lesion is 40% stenosed.   1st Diag lesion is 90% stenosed.   Prox Cx lesion is 40% stenosed. 1.  High-grade ostial lesion of very small diagonal which should be treated medically with mild to moderate disease elsewhere. 2.  Fick cardiac output of 9.7 L/min and Fick cardiac index of 4.9 L/min/m with the following hemodynamics:  Right atrial pressure mean of 15 mmHg  Right ventricular pressure 68/5 with an end-diastolic pressure of 28 mmHg  Wedge pressure mean of 27 mmHg with V waves to 30 mmHg  PA pressure 70/29 with a mean of 47 mmHg  PVR 2.1 Woods units  PA pulsatility index of 2.7 3.  LVEDP of 24 mmHg Recommendation: The patient was administered 40 mg of Lasix in the cardiac catheterization laboratory; continue  diuretic therapy and medical optimization.  The results were reviewed with Dr. Rennis Golden.   DG Elbow 2 Views Right Result Date: 11/02/2023 CLINICAL DATA:  Right elbow pain. EXAM: RIGHT ELBOW - 2 VIEW COMPARISON:  None Available. FINDINGS: An IV catheter is seen extending into the volar soft tissues at the distal aspect of the upper arm proximal to the elbow. No joint effusion. Minimal chronic enthesopathic change at the triceps insertion on the olecranon. Mild medial and lateral elbow chondrocalcinosis. No significant joint space narrowing. No acute fracture or dislocation. Moderate atherosclerotic calcifications. IMPRESSION: Mild medial and lateral elbow chondrocalcinosis. No significant joint space narrowing. Electronically Signed   By: Neita Garnet M.D.   On: 11/02/2023 13:53   DG Foot 2 Views Left Result Date: 11/01/2023 CLINICAL DATA:  Left foot pain.  No known injury. EXAM: LEFT FOOT - 2 VIEW COMPARISON:  10/01/2023. FINDINGS: There is no evidence of acute fracture or dislocation. Degenerative changes are present in the midfoot. Moderate calcaneal spurring is present. Vascular calcifications are present in the soft tissues. IMPRESSION: No acute osseous abnormality. Electronically Signed   By: Thornell Sartorius M.D.   On: 11/01/2023 19:33   MR CARDIAC MORPHOLOGY W WO CONTRAST Result Date: 11/01/2023 CLINICAL DATA:  1M with HFrEF, cirrhosis, CKD p/w NSTEMI. Troponin up to 1084. Also with fevers and elevated ESR, CRP. Echo with EF 55-60%. EXAM: CARDIAC MRI TECHNIQUE: The patient was scanned on a 1.5 Tesla Siemens magnet. A dedicated cardiac coil was used. Functional imaging was done using Fiesta sequences. 2,3, and 4 chamber views were done to assess for RWMA's. Modified Simpson's rule using a short axis stack was used to calculate an ejection fraction on a dedicated work [employer], [employer]. The patient received 8 cc of Gadavist. After 10 minutes inversion recovery sequences were used to assess for  infiltration and scar tissue. Phase contrast velocity mapping was performed above the aortic and pulmonic valves CONTRAST:  8 cc  of Gadavist FINDINGS: Left ventricle: -Asymmetric LV hypertrophy measuring 14mm in septum (9mm in posterior wall) -Mild dilatation -Moderate systolic dysfunction -  Markedly elevated ECV (43%), native T1 (up to in basal inferior wall), and T2 (up to 64ms in basal anterior wall) -Dense LGE in basal septum and inferior walls, in addition to RV insertion sites. LV EF: 37% (Normal 49-79%) Absolute volumes: LV EDV: (Normal 95-215 mL) LV ESV: (Normal 25-85 mL) LV SV: 79mL (Normal 61-145 mL) CO: 6.7L/min (Normal 3.4-7.8 L/min) Indexed volumes: LV EDV: 155mL/sq-m (Normal 50-108 mL/sq-m) LV ESV: 81mL/sq-m (Normal 11-47 mL/sq-m) LV SV: 27mL/sq-m (Normal 33-72 mL/sq-m) CI: 3.4L/min/sq-m (Normal 1.8-4.2 L/min/sq-m) Right ventricle: Normal size with moderate systolic dysfunction RV EF:  39% (Normal 51-80%) Absolute volumes: RV EDV: (Normal 109-217 mL) RV ESV: (Normal 23-91 mL) RV SV: 77mL (Normal 71-141 mL) CO: 6.5L/min (Normal 2.8-8.8 L/min) Indexed volumes: RV EDV: 181mL/sq-m (Normal 58-109 mL/sq-m) RV ESV: 77mL/sq-m (Normal 12-46 mL/sq-m) RV SV: 44mL/sq-m (Normal 38-71 mL/sq-m) CI: 3.3L/min/sq-m (Normal 1.7-4.2 L/min/sq-m) Left atrium: Moderate enlargement Right atrium: Mild enlargement Mitral valve: Mild regurgitation Aortic valve: Tricuspid. Trivial regurgitation Tricuspid valve: Mild regurgitation Pulmonic valve: No regurgitation Aorta: Normal proximal ascending aorta Pulmonary artery: Dilated main PA measuring 31mm Pericardium: Small effusion IMPRESSION: 1. Diffusely elevated myocardial T2 values (along with T1 and ECV), suggesting myocardial edema and meeting criteria for acute myocarditis. In addition, there is dense LGE in basal septum and inferior walls, along with RV insertion sites, which is a nonischemic scar pattern. Given multifocal LGE, while could be due  to myocarditis, would also consider sarcoidosis on the differential and recommend chest CT to evaluate for pulmonary sarcoid and FDG-PET to evaluate for cardiac sarcoid. Finally, global ECV is markedly elevated (43%), which is in the amyloid range; suspect ECV elevation is due to diffuse myocardial edema in setting of myocarditis rather than amyloid, and would recommend repeating cardiac MRI in 3 months to reevaluate ECV once inflammation has resolved 2. Asymmetric LV hypertrophy measuring 14mm in septum (9mm in posterior wall), not meeting criteria for hypertrophic cardiomyopathy (<69mm) 3.  Mild LV dilatation with moderate systolic dysfunction (EF 37%) 4.  Normal RV size with moderate systolic dysfunction (EF 39%) 5.  Small pericardial effusion 6.  Dilated main pulmonary artery measuring 31mm Electronically Signed   By: Epifanio Lesches M.D.   On: 11/01/2023 14:38   MR CARDIAC VELOCITY FLOW MAP Result Date: 11/01/2023 CLINICAL DATA:  40M with HFrEF, cirrhosis, CKD p/w NSTEMI. Troponin up to 1084. Also with fevers and elevated ESR, CRP. Echo with EF 55-60%. EXAM: CARDIAC MRI TECHNIQUE: The patient was scanned on a 1.5 Tesla Siemens magnet. A dedicated cardiac coil was used. Functional imaging was done using Fiesta sequences. 2,3, and 4 chamber views were done to assess for RWMA's. Modified Simpson's rule using a short axis stack was used to calculate an ejection fraction on a dedicated work [employer] officer, [employer]. The patient received 8 cc of Gadavist. After 10 minutes inversion recovery sequences were used to assess for infiltration and scar tissue. Phase contrast velocity mapping was performed above the aortic and pulmonic valves CONTRAST:  8 cc  of Gadavist FINDINGS: Left ventricle: -Asymmetric LV hypertrophy measuring 14mm in septum (9mm in posterior wall) -Mild dilatation -Moderate systolic dysfunction -Markedly elevated ECV (43%), native T1 (up to in basal inferior wall), and T2 (up to  64ms in basal anterior wall) -Dense LGE in basal septum and inferior walls, in addition to RV insertion sites. LV EF: 37% (Normal 49-79%) Absolute volumes: LV EDV: (Normal 95-215 mL) LV ESV: (Normal 25-85 mL) LV SV: 79mL (  Normal 61-145 mL) CO: 6.7L/min (Normal 3.4-7.8 L/min) Indexed volumes: LV EDV: 157mL/sq-m (Normal 50-108 mL/sq-m) LV ESV: 7mL/sq-m (Normal 11-47 mL/sq-m) LV SV: 14mL/sq-m (Normal 33-72 mL/sq-m) CI: 3.4L/min/sq-m (Normal 1.8-4.2 L/min/sq-m) Right ventricle: Normal size with moderate systolic dysfunction RV EF:  39% (Normal 51-80%) Absolute volumes: RV EDV: (Normal 109-217 mL) RV ESV: (Normal 23-91 mL) RV SV: 77mL (Normal 71-141 mL) CO: 6.5L/min (Normal 2.8-8.8 L/min) Indexed volumes: RV EDV: 158mL/sq-m (Normal 58-109 mL/sq-m) RV ESV: 16mL/sq-m (Normal 12-46 mL/sq-m) RV SV: 45mL/sq-m (Normal 38-71 mL/sq-m) CI: 3.3L/min/sq-m (Normal 1.7-4.2 L/min/sq-m) Left atrium: Moderate enlargement Right atrium: Mild enlargement Mitral valve: Mild regurgitation Aortic valve: Tricuspid. Trivial regurgitation Tricuspid valve: Mild regurgitation Pulmonic valve: No regurgitation Aorta: Normal proximal ascending aorta Pulmonary artery: Dilated main PA measuring 31mm Pericardium: Small effusion IMPRESSION: 1. Diffusely elevated myocardial T2 values (along with T1 and ECV), suggesting myocardial edema and meeting criteria for acute myocarditis. In addition, there is dense LGE in basal septum and inferior walls, along with RV insertion sites, which is a nonischemic scar pattern. Given multifocal LGE, while could be due to myocarditis, would also consider sarcoidosis on the differential and recommend chest CT to evaluate for pulmonary sarcoid and FDG-PET to evaluate for cardiac sarcoid. Finally, global ECV is markedly elevated (43%), which is in the amyloid range; suspect ECV elevation is due to diffuse myocardial edema in setting of myocarditis rather than amyloid, and would recommend repeating  cardiac MRI in 3 months to reevaluate ECV once inflammation has resolved 2. Asymmetric LV hypertrophy measuring 14mm in septum (9mm in posterior wall), not meeting criteria for hypertrophic cardiomyopathy (<76mm) 3.  Mild LV dilatation with moderate systolic dysfunction (EF 37%) 4.  Normal RV size with moderate systolic dysfunction (EF 39%) 5.  Small pericardial effusion 6.  Dilated main pulmonary artery measuring 31mm Electronically Signed   By: Epifanio Lesches M.D.   On: 11/01/2023 14:38   MR CARDIAC VELOCITY FLOW MAP Result Date: 11/01/2023 CLINICAL DATA:  63M with HFrEF, cirrhosis, CKD p/w NSTEMI. Troponin up to 1084. Also with fevers and elevated ESR, CRP. Echo with EF 55-60%. EXAM: CARDIAC MRI TECHNIQUE: The patient was scanned on a 1.5 Tesla Siemens magnet. A dedicated cardiac coil was used. Functional imaging was done using Fiesta sequences. 2,3, and 4 chamber views were done to assess for RWMA's. Modified Simpson's rule using a short axis stack was used to calculate an ejection fraction on a dedicated work [employer] officer, [employer]. The patient received 8 cc of Gadavist. After 10 minutes inversion recovery sequences were used to assess for infiltration and scar tissue. Phase contrast velocity mapping was performed above the aortic and pulmonic valves CONTRAST:  8 cc  of Gadavist FINDINGS: Left ventricle: -Asymmetric LV hypertrophy measuring 14mm in septum (9mm in posterior wall) -Mild dilatation -Moderate systolic dysfunction -Markedly elevated ECV (43%), native T1 (up to in basal inferior wall), and T2 (up to 64ms in basal anterior wall) -Dense LGE in basal septum and inferior walls, in addition to RV insertion sites. LV EF: 37% (Normal 49-79%) Absolute volumes: LV EDV: (Normal 95-215 mL) LV ESV: (Normal 25-85 mL) LV SV: 79mL (Normal 61-145 mL) CO: 6.7L/min (Normal 3.4-7.8 L/min) Indexed volumes: LV EDV: 178mL/sq-m (Normal 50-108 mL/sq-m) LV ESV: 35mL/sq-m (Normal 11-47  mL/sq-m) LV SV: 57mL/sq-m (Normal 33-72 mL/sq-m) CI: 3.4L/min/sq-m (Normal 1.8-4.2 L/min/sq-m) Right ventricle: Normal size with moderate systolic dysfunction RV EF:  39% (Normal 51-80%) Absolute volumes: RV EDV: (Normal 109-217 mL) RV ESV: (  Normal 23-91 mL) RV SV: 77mL (Normal 71-141 mL) CO: 6.5L/min (Normal 2.8-8.8 L/min) Indexed volumes: RV EDV: 191mL/sq-m (Normal 58-109 mL/sq-m) RV ESV: 65mL/sq-m (Normal 12-46 mL/sq-m) RV SV: 62mL/sq-m (Normal 38-71 mL/sq-m) CI: 3.3L/min/sq-m (Normal 1.7-4.2 L/min/sq-m) Left atrium: Moderate enlargement Right atrium: Mild enlargement Mitral valve: Mild regurgitation Aortic valve: Tricuspid. Trivial regurgitation Tricuspid valve: Mild regurgitation Pulmonic valve: No regurgitation Aorta: Normal proximal ascending aorta Pulmonary artery: Dilated main PA measuring 31mm Pericardium: Small effusion IMPRESSION: 1. Diffusely elevated myocardial T2 values (along with T1 and ECV), suggesting myocardial edema and meeting criteria for acute myocarditis. In addition, there is dense LGE in basal septum and inferior walls, along with RV insertion sites, which is a nonischemic scar pattern. Given multifocal LGE, while could be due to myocarditis, would also consider sarcoidosis on the differential and recommend chest CT to evaluate for pulmonary sarcoid and FDG-PET to evaluate for cardiac sarcoid. Finally, global ECV is markedly elevated (43%), which is in the amyloid range; suspect ECV elevation is due to diffuse myocardial edema in setting of myocarditis rather than amyloid, and would recommend repeating cardiac MRI in 3 months to reevaluate ECV once inflammation has resolved 2. Asymmetric LV hypertrophy measuring 14mm in septum (9mm in posterior wall), not meeting criteria for hypertrophic cardiomyopathy (<54mm) 3.  Mild LV dilatation with moderate systolic dysfunction (EF 37%) 4.  Normal RV size with moderate systolic dysfunction (EF 39%) 5.  Small pericardial effusion 6.   Dilated main pulmonary artery measuring 31mm Electronically Signed   By: Epifanio Lesches M.D.   On: 11/01/2023 14:38   ECHOCARDIOGRAM LIMITED Result Date: 10/29/2023    ECHOCARDIOGRAM LIMITED REPORT   Patient Name:   COLEN ELTZROTH [facility] Date of Exam: 10/29/2023 Medical Rec #:  [id number]       Height:       72.0 in Accession #:    [id number]      Weight:       165.3 lb Date of Birth:  10/17/1945       BSA:          1.965 m Patient Age:    77 years        BP:           176/89 mmHg Patient Gender: M               HR:           90 bpm. Exam Location:  [name] Procedure: Limited Echo Indications:    LVEF  History:        Patient has prior history of Echocardiogram examinations, most                 recent 09/29/2023. CHF and Cardiomyopathy; Risk                 Factors:Hypertension.  Sonographer:    Darlys Gales Referring Phys: [id number] ERIC J [place] IMPRESSIONS  1. No doppler analysis done.  2. Left ventricular ejection fraction, by estimation, is 55 to 60%. The left ventricle has normal function. The left ventricle has no regional wall motion abnormalities. There is mild concentric left ventricular hypertrophy.  3. Right ventricular systolic function is normal. The right ventricular size is normal.  4. Left atrial size was moderately dilated.  5. The mitral valve is grossly normal.  6. The aortic valve is tricuspid. No aortic stenosis is present.  7. The inferior vena cava is normal in size with greater than 50% respiratory variability, suggesting right atrial pressure  of 3 mmHg. Comparison(s): The left ventricular function is unchanged. FINDINGS  Left Ventricle: Left ventricular ejection fraction, by estimation, is 55 to 60%. The left ventricle has normal function. The left ventricle has no regional wall motion abnormalities. The left ventricular internal cavity size was normal in size. There is  mild concentric left ventricular hypertrophy. Right Ventricle: The right ventricular size is normal. Right vetricular  wall thickness was not assessed. Right ventricular systolic function is normal. Left Atrium: Left atrial size was moderately dilated. Right Atrium: Right atrial size was normal in size. Pericardium: There is no evidence of pericardial effusion. Mitral Valve: The mitral valve is grossly normal. Tricuspid Valve: The tricuspid valve is grossly normal. Aortic Valve: The aortic valve is tricuspid. No aortic stenosis is present. Pulmonic Valve: The pulmonic valve was grossly normal. Venous: The inferior vena cava is normal in size with greater than 50% respiratory variability, suggesting right atrial pressure of 3 mmHg. IAS/Shunts: The interatrial septum was not assessed. LEFT VENTRICLE PLAX 2D LVIDd:         4.80 cm LVIDs:         3.30 cm LV PW:         1.30 cm LV IVS:        1.20 cm  LEFT ATRIUM             Index LA Vol (A2C):   72.6 ml 36.95 ml/m LA Vol (A4C):   75.8 ml 38.57 ml/m LA Biplane Vol: 74.9 ml 38.12 ml/m Dietrich Pates MD Electronically signed by Dietrich Pates MD Signature Date/Time: 10/29/2023/3:30:59 PM    Final    DG Chest Port 1 View Result Date: 10/28/2023 CLINICAL DATA:  Chest pain and fever EXAM: PORTABLE CHEST 1 VIEW COMPARISON:  10/13/2023, 09/14/2023, 07/19/2022 FINDINGS: Pleuroparenchymal scarring at the left base. Borderline to mild cardiomegaly. No acute airspace disease or pneumothorax. IMPRESSION: No active disease. Pleuroparenchymal scarring at the left base. Electronically Signed   By: Jasmine Pang M.D.   On: 10/28/2023 19:48   CT ABDOMEN PELVIS W CONTRAST Result Date: 10/14/2023 CLINICAL DATA:  79 year old male with history of sepsis. Evaluate for intra-abdominal source of infection. EXAM: CT ABDOMEN AND PELVIS WITH CONTRAST TECHNIQUE: Multidetector CT imaging of the abdomen and pelvis was performed using the standard protocol following bolus administration of intravenous contrast. RADIATION DOSE REDUCTION: This exam was performed according to the departmental dose-optimization program  which includes automated exposure control, adjustment of the mA and/or kV according to patient size and/or use of iterative reconstruction technique. CONTRAST:  OMNIPAQUE IOHEXOL 300 MG/ML  SOLN COMPARISON:  CT of the abdomen and pelvis 07/24/2023. FINDINGS: Lower chest: Mild scarring in the visualize lung bases. Atherosclerotic calcifications are noted in the descending thoracic aorta as well as the left main, left anterior descending, left circumflex and right coronary arteries. Hepatobiliary: Liver has a shrunken appearance and nodular contour, indicative of underlying cirrhosis. No suspicious cystic or solid hepatic lesions. No intra or extrahepatic biliary ductal dilatation. Gallbladder is unremarkable in appearance. Pancreas: No pancreatic mass. No pancreatic ductal dilatation. Trace amount of fluid adjacent to the tail of the pancreas. No well organized pancreatic or peripancreatic fluid collections. Spleen: Unremarkable. Adrenals/Urinary Tract: Mild bilateral perinephric stranding (nonspecific). 1 cm low-attenuation lesion in the interpolar region of the right kidney compatible with a simple (Bosniak class 1) cysts, which requires no imaging follow-up. No aggressive appearing renal lesions. No hydroureteronephrosis. Urinary bladder is distended, but otherwise unremarkable in appearance. Bilateral adrenal glands are normal in  appearance. Stomach/Bowel: The appearance of the stomach is normal. There is no pathologic dilatation of small bowel or colon. Numerous colonic diverticuli are noted, without surrounding inflammatory changes to indicate an acute diverticulitis at this time. The appendix is not confidently identified and may be surgically absent. Regardless, there are no inflammatory changes noted adjacent to the cecum to suggest the presence of an acute appendicitis at this time. Vascular/Lymphatic: Atherosclerosis throughout the abdominal aorta and pelvic vasculature, without evidence of aneurysm  or dissection. No lymphadenopathy noted in the abdomen or pelvis. Reproductive: Prostate gland and seminal vesicles are unremarkable in appearance. Other: No significant volume of ascites.  No pneumoperitoneum. Musculoskeletal: There are no aggressive appearing lytic or blastic lesions noted in the visualized portions of the skeleton. IMPRESSION: 1. Trace amount of fluid adjacent to the tail of the pancreas. This is nonspecific, but could indicate a mild acute interstitial pancreatitis. Correlation with serum lipase is recommended. 2. Cirrhosis. 3. Colonic diverticulosis without evidence of acute diverticulitis at this time. 4. Aortic atherosclerosis, in addition to left main and three-vessel coronary artery disease. Aortic Atherosclerosis (ICD10-I70.0). Electronically Signed   By: Trudie Reed M.D.   On: 10/14/2023 06:04   DG Chest Port 1 View Result Date: 10/13/2023 CLINICAL DATA:  Sepsis, febrile EXAM: PORTABLE CHEST 1 VIEW COMPARISON:  09/29/2023 FINDINGS: Single frontal view of the chest demonstrates a stable cardiac silhouette. Chronic elevation of the right hemidiaphragm. Left hemidiaphragm is excluded by collimation. No airspace disease, effusion, or pneumothorax. No acute bony abnormalities. IMPRESSION: 1. Stable chest, no acute process. Electronically Signed   By: Sharlet Salina M.D.   On: 10/13/2023 20:30     Discharge Exam: Vitals:   11/08/23 2111 11/09/23 0538  BP: 138/66 124/64  Pulse: 88 66  Resp: 20 18  Temp: 98.5 F (36.9 C) 98.3 F (36.8 C)  SpO2:  97%   Vitals:   11/08/23 1504 11/08/23 1626 11/08/23 2111 11/09/23 0538  BP: 126/60 125/69 138/66 124/64  Pulse: 80 69 88 66  Resp: 20  20 18   Temp: 98.5 F (36.9 C)  98.5 F (36.9 C) 98.3 F (36.8 C)  TempSrc: Oral  Oral Oral  SpO2: 100%   97%  Weight:      Height:        General: Pt is alert, awake, not in acute distress Cardiovascular: RRR, S1/S2 +, no rubs, no gallops Respiratory: CTA bilaterally, no wheezing,  no rhonchi Abdominal: Soft, NT, ND, bowel sounds + Extremities: no edema, no cyanosis    The results of significant diagnostics from this hospitalization (including imaging, microbiology, ancillary and laboratory) are listed below for reference.     Microbiology: Recent Results (from the past 240 hours)  SARS Coronavirus 2 by RT PCR (hospital order, performed in [facility] hospital lab) *cepheid single result test* Anterior Nasal Swab     Status: None   Collection Time: 11/06/23 10:22 AM   Specimen: Anterior Nasal Swab  Result Value Ref Range Status   SARS Coronavirus 2 by RT PCR NEGATIVE NEGATIVE Final    Comment: Performed at [facility], [address] [place], [place] [address]  Respiratory (~20 pathogens) panel by PCR     Status: Abnormal   Collection Time: 11/06/23  1:15 PM   Specimen: Nasopharyngeal Swab; Respiratory  Result Value Ref Range Status   Adenovirus NOT DETECTED NOT DETECTED Final   Coronavirus 229E NOT DETECTED NOT DETECTED Final    Comment: (NOTE) The Coronavirus on the Respiratory Panel,  DOES NOT test for the novel  Coronavirus (2019 nCoV)    Coronavirus HKU1 NOT DETECTED NOT DETECTED Final   Coronavirus NL63 NOT DETECTED NOT DETECTED Final   Coronavirus OC43 NOT DETECTED NOT DETECTED Final   Metapneumovirus NOT DETECTED NOT DETECTED Final   Rhinovirus / Enterovirus NOT DETECTED NOT DETECTED Final   Influenza A NOT DETECTED NOT DETECTED Final   Influenza B NOT DETECTED NOT DETECTED Final   Parainfluenza Virus 1 NOT DETECTED NOT DETECTED Final   Parainfluenza Virus 2 NOT DETECTED NOT DETECTED Final   Parainfluenza Virus 3 NOT DETECTED NOT DETECTED Final   Parainfluenza Virus 4 NOT DETECTED NOT DETECTED Final   Respiratory Syncytial Virus DETECTED (A) NOT DETECTED Final   Bordetella pertussis NOT DETECTED NOT DETECTED Final   Bordetella Parapertussis NOT DETECTED NOT DETECTED Final   Chlamydophila pneumoniae NOT DETECTED NOT DETECTED Final    Mycoplasma pneumoniae NOT DETECTED NOT DETECTED Final    Comment: Performed at [facility], [address] [place], [place] [address]     Labs: BNP (last 3 results) Recent Labs    10/29/23 0822 10/30/23 0304 10/31/23 0433  BNP 1,224.0* 1,581.0* 1,410.0*   Basic Metabolic Panel: Recent Labs  Lab 11/03/23 0318 11/04/23 0349 11/05/23 0408 11/06/23 0418 11/08/23 0458  NA 137 136 136 137 134*  K 3.8 3.9 3.8 3.9 3.8  CL 99 100 100 101 99  CO2 24 24 23 25 24   GLUCOSE 93 89 93 100* 86  BUN 10 7* 8 7* 7*  CREATININE 0.90 0.99 0.98 0.92 0.96  CALCIUM 9.0 8.8* 9.3 9.2 9.1  MG 1.5* 1.7 1.6* 1.8 1.5*  PHOS  --   --   --  3.5  --    Liver Function Tests: Recent Labs  Lab 11/06/23 0418 11/08/23 0458  AST  --  27  ALT  --  25  ALKPHOS  --  80  BILITOT  --  0.6  PROT  --  5.6*  ALBUMIN 2.0* 2.0*   No results for input(s): "LIPASE", "AMYLASE" in the last 168 hours. No results for input(s): "AMMONIA" in the last 168 hours. CBC: Recent Labs  Lab 11/03/23 0318 11/04/23 0349 11/05/23 0408 11/06/23 0418 11/08/23 0458  WBC 8.1 7.1 5.9 6.2 7.6  HGB 7.7* 7.6* 9.1* 9.1* 9.2*  HCT 23.8* 23.6* 27.7* 27.7* 28.1*  MCV 90.8 90.8 90.2 90.5 89.5  PLT 355 363 357 380 390   Cardiac Enzymes: No results for input(s): "CKTOTAL", "CKMB", "CKMBINDEX", "TROPONINI" in the last 168 hours. BNP: Invalid input(s): "POCBNP" CBG: No results for input(s): "GLUCAP" in the last 168 hours. D-Dimer No results for input(s): "DDIMER" in the last 72 hours. Hgb A1c No results for input(s): "HGBA1C" in the last 72 hours. Lipid Profile No results for input(s): "CHOL", "HDL", "LDLCALC", "TRIG", "CHOLHDL", "LDLDIRECT" in the last 72 hours. Thyroid function studies No results for input(s): "TSH", "T4TOTAL", "T3FREE", "THYROIDAB" in the last 72 hours.  Invalid input(s): "FREET3" Anemia work up No results for input(s): "VITAMINB12", "FOLATE", "FERRITIN", "TIBC", "IRON", "RETICCTPCT" in the last  72 hours. Urinalysis    Component Value Date/Time   COLORURINE AMBER (A) 10/28/2023 1922   APPEARANCEUR CLEAR 10/28/2023 1922   LABSPEC 1.018 10/28/2023 1922   PHURINE 6.0 10/28/2023 1922   GLUCOSEU NEGATIVE 10/28/2023 1922   HGBUR NEGATIVE 10/28/2023 1922   BILIRUBINUR NEGATIVE 10/28/2023 1922   KETONESUR NEGATIVE 10/28/2023 1922   PROTEINUR NEGATIVE 10/28/2023 1922   UROBILINOGEN 1.0 02/01/2012 1300  NITRITE NEGATIVE 10/28/2023 1922   LEUKOCYTESUR NEGATIVE 10/28/2023 1922   Sepsis Labs Recent Labs  Lab 11/04/23 0349 11/05/23 0408 11/06/23 0418 11/08/23 0458  WBC 7.1 5.9 6.2 7.6   Microbiology Recent Results (from the past 240 hours)  SARS Coronavirus 2 by RT PCR (hospital order, performed in [facility] hospital lab) *cepheid single result test* Anterior Nasal Swab     Status: None   Collection Time: 11/06/23 10:22 AM   Specimen: Anterior Nasal Swab  Result Value Ref Range Status   SARS Coronavirus 2 by RT PCR NEGATIVE NEGATIVE Final    Comment: Performed at [facility], [address], [place], [place] [address]  Respiratory (~20 pathogens) panel by PCR     Status: Abnormal   Collection Time: 11/06/23  1:15 PM   Specimen: Nasopharyngeal Swab; Respiratory  Result Value Ref Range Status   Adenovirus NOT DETECTED NOT DETECTED Final   Coronavirus 229E NOT DETECTED NOT DETECTED Final    Comment: (NOTE) The Coronavirus on the Respiratory Panel, DOES NOT test for the novel  Coronavirus (2019 nCoV)    Coronavirus HKU1 NOT DETECTED NOT DETECTED Final   Coronavirus NL63 NOT DETECTED NOT DETECTED Final   Coronavirus OC43 NOT DETECTED NOT DETECTED Final   Metapneumovirus NOT DETECTED NOT DETECTED Final   Rhinovirus / Enterovirus NOT DETECTED NOT DETECTED Final   Influenza A NOT DETECTED NOT DETECTED Final   Influenza B NOT DETECTED NOT DETECTED Final   Parainfluenza Virus 1 NOT DETECTED NOT DETECTED Final   Parainfluenza Virus 2 NOT DETECTED NOT DETECTED Final    Parainfluenza Virus 3 NOT DETECTED NOT DETECTED Final   Parainfluenza Virus 4 NOT DETECTED NOT DETECTED Final   Respiratory Syncytial Virus DETECTED (A) NOT DETECTED Final   Bordetella pertussis NOT DETECTED NOT DETECTED Final   Bordetella Parapertussis NOT DETECTED NOT DETECTED Final   Chlamydophila pneumoniae NOT DETECTED NOT DETECTED Final   Mycoplasma pneumoniae NOT DETECTED NOT DETECTED Final    Comment: Performed at [facility], [address], [place], [place] [address]    FURTHER DISCHARGE INSTRUCTIONS:   Get Medicines reviewed and adjusted: Please take all your medications with you for your next visit with your Primary MD   Laboratory/radiological data: Please request your Primary MD to go over all hospital tests and procedure/radiological results at the follow up, please ask your Primary MD to get all Hospital records sent to his/her office.   In some cases, they will be blood work, cultures and biopsy results pending at the time of your discharge. Please request that your primary care M.D. goes through all the records of your hospital data and follows up on these results.   Also Note the following: If you experience worsening of your admission symptoms, develop shortness of breath, life threatening emergency, suicidal or homicidal thoughts you must seek medical attention immediately by calling 911 or calling your MD immediately  if symptoms less severe.   You must read complete instructions/literature along with all the possible adverse reactions/side effects for all the Medicines you take and that have been prescribed to you. Take any new Medicines after you have completely understood and accpet all the possible adverse reactions/side effects.    Do not drive when taking Pain medications or sleeping medications (Benzodaizepines)   Do not take more than prescribed Pain, Sleep and Anxiety Medications. It is not advisable to combine anxiety,sleep and pain medications  without talking with your primary care practitioner   Special  Instructions: If you have smoked or chewed Tobacco  in the last 2 yrs please stop smoking, stop any regular Alcohol  and or any Recreational drug use.   Wear Seat belts while driving.   Please note: You were cared for by a hospitalist during your hospital stay. Once you are discharged, your primary care physician will handle any further medical issues. Please note that NO REFILLS for any discharge medications will be authorized once you are discharged, as it is imperative that you return to your primary care physician (or establish a relationship with a primary care physician if you do not have one) for your post hospital discharge needs so that they can reassess your need for medications and monitor your lab values  Time coordinating discharge: Over 30 minutes  SIGNED:   Hughie Closs, MD  [facility] 11/09/2023, 12:24 PM *Please note that this is a verbal dictation therefore any spelling or grammatical errors are due to the "Dragon Medical One" system interpretation. If 7PM-7AM, please contact night-coverage [email or web address]

## 2023-11-09 NOTE — TOC Transition Note (Signed)
Transition of Care [facility]) - Discharge Note   Patient Details  Name: Don [name] MRN: [id number] Date of Birth: [birth year]  Transition of Care [facility]) CM/SW Contact:  Delilah Shan, LCSWA Phone Number: 11/09/2023, 12:38 PM   Clinical Narrative:     Patient will DC to: [facility]   Anticipated DC date: 11/09/2023  Family notified: Dianne  Transport by: Sharin Mons  ?  Per MD patient ready for DC to [facility] . RN, patient, patient's family, and facility notified of DC. Discharge Summary sent to facility. Debbie with facility is going have RN from facility call patients RN here for report.Patient will go to RM# B22-Bed 2. DC packet on chart. DNR signed by MD attached to patients DC packet. Ambulance transport requested for patient.  CSW signing off.   Final next level of care: Skilled Nursing Facility Barriers to Discharge: No Barriers Identified   Patient Goals and CMS Choice Patient states their goals for this hospitalization and ongoing recovery are:: SNF   Choice offered to / list presented to : Patient, Sibling      Discharge Placement              Patient chooses bed at:  [facility]) Patient to be transferred to facility by: [facility] Name of family member notified: Dianne Sister Patient and family notified of of transfer: 11/09/23  Discharge Plan and Services Additional resources added to the After Visit Summary for   In-house Referral: Clinical Social Work                                   Social Drivers of Health (SDOH) Interventions SDOH Screenings   Food Insecurity: Patient Unable To Answer (10/30/2023)  Housing: Low Risk  (10/14/2023)  Transportation Needs: No Transportation Needs (10/14/2023)  Utilities: Not At Risk (10/14/2023)  Social Connections: Unknown (10/31/2023)  Tobacco Use: Medium Risk (10/28/2023)     Readmission Risk Interventions    10/29/2023   10:25 AM 10/14/2023   11:02 AM  Readmission Risk Prevention Plan   Transportation Screening Complete Complete  HRI or Home Care Consult  Complete  Social Work Consult for Recovery Care Planning/Counseling  Complete  Palliative Care Screening  Not Applicable  Medication Review [employer]) Complete Complete  HRI or Home Care Consult Complete   SW Recovery Care/Counseling Consult Complete   Palliative Care Screening Not Applicable   Skilled Nursing Facility Complete

## 2023-12-10 ENCOUNTER — Emergency Department (HOSPITAL_COMMUNITY)
Admission: EM | Admit: 2023-12-10 | Discharge: 2023-12-10 | Disposition: A | Discharge status: Home / Self Care | Attending: Emergency Medicine | Admitting: Emergency Medicine

## 2023-12-10 ENCOUNTER — Other Ambulatory Visit: Payer: Self-pay

## 2023-12-10 ENCOUNTER — Encounter (HOSPITAL_COMMUNITY): Payer: Self-pay

## 2023-12-10 ENCOUNTER — Emergency Department (HOSPITAL_COMMUNITY)

## 2023-12-10 DIAGNOSIS — M542 Cervicalgia: Secondary | ICD-10-CM

## 2023-12-10 DIAGNOSIS — Z7982 Long term (current) use of aspirin: Secondary | ICD-10-CM | POA: Diagnosis not present

## 2023-12-10 DIAGNOSIS — I509 Heart failure, unspecified: Secondary | ICD-10-CM | POA: Insufficient documentation

## 2023-12-10 DIAGNOSIS — I110 Hypertensive heart disease with heart failure: Secondary | ICD-10-CM | POA: Diagnosis not present

## 2023-12-10 DIAGNOSIS — M436 Torticollis: Secondary | ICD-10-CM | POA: Diagnosis not present

## 2023-12-10 LAB — BASIC METABOLIC PANEL
Anion gap: 9 (ref 5–15)
BUN: 14 mg/dL (ref 8–23)
CO2: 26 mmol/L (ref 22–32)
Calcium: 9.3 mg/dL (ref 8.9–10.3)
Chloride: 99 mmol/L (ref 98–111)
Creatinine, Ser: 0.83 mg/dL (ref 0.61–1.24)
GFR, Estimated: >60 mL/min (ref >60)
Glucose, Bld: 105 mg/dL — ABNORMAL HIGH (ref 70–99)
Potassium: 3.9 mmol/L (ref 3.5–5.1)
Sodium: 134 mmol/L — ABNORMAL LOW (ref 135–145)

## 2023-12-10 LAB — RESP PANEL BY RT-PCR (RSV, FLU A&B, COVID)  RVPGX2
Influenza A by PCR: NEGATIVE
Influenza B by PCR: NEGATIVE
Resp Syncytial Virus by PCR: NEGATIVE
SARS Coronavirus 2 by RT PCR: NEGATIVE

## 2023-12-10 LAB — CBC WITH DIFFERENTIAL/PLATELET
Abs Immature Granulocytes: 0.04 10*3/uL (ref 0.00–0.07)
Basophils Absolute: 0 10*3/uL (ref 0.0–0.1)
Basophils Relative: 0 %
Eosinophils Absolute: 0.2 10*3/uL (ref 0.0–0.5)
Eosinophils Relative: 2 %
HCT: 29.9 % — ABNORMAL LOW (ref 39.0–52.0)
Hemoglobin: 9.8 g/dL — ABNORMAL LOW (ref 13.0–17.0)
Immature Granulocytes: 0 %
Lymphocytes Relative: 8 %
Lymphs Abs: 0.9 10*3/uL (ref 0.7–4.0)
MCH: 29.4 pg (ref 26.0–34.0)
MCHC: 32.8 g/dL (ref 30.0–36.0)
MCV: 89.8 fL (ref 80.0–100.0)
Monocytes Absolute: 0.9 10*3/uL (ref 0.1–1.0)
Monocytes Relative: 8 %
Neutro Abs: 9 10*3/uL — ABNORMAL HIGH (ref 1.7–7.7)
Neutrophils Relative %: 82 %
Platelets: 233 10*3/uL (ref 150–400)
RBC: 3.33 MIL/uL — ABNORMAL LOW (ref 4.22–5.81)
RDW: 16.6 % — ABNORMAL HIGH (ref 11.5–15.5)
WBC: 11 10*3/uL — ABNORMAL HIGH (ref 4.0–10.5)
nRBC: 0 % (ref 0.0–0.2)

## 2023-12-10 MED ORDER — LIDOCAINE 5 % EX PTCH
1.0000 | MEDICATED_PATCH | CUTANEOUS | Status: DC
  Administered 2023-12-10: 1 via TRANSDERMAL
  Filled 2023-12-10: qty 1

## 2023-12-10 MED ORDER — IOHEXOL 300 MG/ML  SOLN
75.0000 mL | Freq: Once | INTRAMUSCULAR | Status: AC | PRN
  Administered 2023-12-10: 75 mL via INTRAVENOUS

## 2023-12-10 MED ORDER — DIPHENHYDRAMINE HCL 50 MG/ML IJ SOLN
12.5000 mg | Freq: Once | INTRAMUSCULAR | Status: AC
  Administered 2023-12-10: 12.5 mg via INTRAVENOUS
  Filled 2023-12-10: qty 1

## 2023-12-10 MED ORDER — CYCLOBENZAPRINE HCL 10 MG PO TABS: 5.0000 mg | ORAL_TABLET | Freq: Two times a day (BID) | ORAL | 0 refills | Status: AC | PRN

## 2023-12-10 MED ORDER — ACETAMINOPHEN 500 MG PO TABS
1000.0000 mg | ORAL_TABLET | Freq: Once | ORAL | Status: AC
  Administered 2023-12-10: 1000 mg via ORAL
  Filled 2023-12-10: qty 2

## 2023-12-10 NOTE — ED Notes (Addendum)
 [facility] was called report given to [employer]

## 2023-12-10 NOTE — ED Notes (Signed)
 Pt lying in bed asleep, no signs of discomfort noticed at this time.

## 2023-12-10 NOTE — ED Notes (Signed)
 [facility] called to transport patient. Nurse aware

## 2023-12-10 NOTE — Discharge Instructions (Signed)
 You were seen for your neck pain in the emergency department.  It is likely from a condition called torticollis.  At home, please try to stretch her neck and use heating pads.  You may use over-the-counter lidocaine patches as well.  If these do not work you may try Tylenol and the muscle laxer (cyclobenzaprine) that we gave you.    Check your MyChart online for the results of any tests that had not resulted by the time you left the emergency department.   Follow-up with your primary doctor in 2-3 days regarding your visit.    Return immediately to the emergency department if you experience any of the following: Difficulty speaking or swallowing, or any other concerning symptoms.    Thank you for visiting our Emergency Department. It was a pleasure taking care of you today.

## 2023-12-10 NOTE — ED Provider Notes (Signed)
 [place] EMERGENCY DEPARTMENT AT [facility] Provider Note   CSN: [id number] Arrival date & time: 12/10/23  7829     History  Chief Complaint  Patient presents with   Sore Throat    Don [name] is a 78 y.o. male.  78 year old male with a history of CHF, liver cirrhosis, hepatitis C, hypertension, hyperlipidemia, and gout who presents to the emergency department with right-sided neck pain.  Over the past few days patient has been having neck pain.  Also reports a sore throat.  No difficulty speaking.  Tolerating secretions.  No shortness of breath.  No fevers.  No cough.  No congestion.  Appears that there may be some sick contacts at [facility] where he stays.  No new medications that his facility is aware of.  History obtained per patient and facility.       Home Medications Prior to Admission medications   Medication Sig Start Date End Date Taking? Authorizing Provider  cyclobenzaprine (FLEXERIL) 10 MG tablet Take 0.5 tablets (5 mg total) by mouth 2 (two) times daily as needed for muscle spasms. 12/10/23  Yes Rondel Baton, MD  ACETAMINOPHEN ER PO Take 325 mg by mouth every 4 (four) hours as needed (general discomfort, elevated temp).    [provider]  albuterol (PROVENTIL) (2.5 MG/3ML) 0.083% nebulizer solution Take 3 mLs (2.5 mg total) by nebulization every 4 (four) hours as needed for wheezing or shortness of breath. 10/16/23   Johnson, Clanford L, MD  albuterol (VENTOLIN HFA) 108 (90 Base) MCG/ACT inhaler Inhale 2 puffs into the lungs every 4 (four) hours as needed for wheezing or shortness of breath. 08/20/23   [provider]  allopurinol (ZYLOPRIM) 100 MG tablet Take 1.5 tablets (150 mg total) by mouth daily. 08/04/23 08/03/24  Vassie Loll, MD  ascorbic acid (VITAMIN C) 500 MG tablet Take 1 tablet (500 mg total) by mouth daily with breakfast. 10/17/23   Cleora Fleet, MD  aspirin EC 81 MG tablet Take 81 mg by mouth daily.     [provider]  calcitRIOL (ROCALTROL) 0.25 MCG capsule Take 0.25 mcg by mouth daily.    [provider]  carvedilol (COREG) 6.25 MG tablet Take 1 tablet (6.25 mg total) by mouth 2 (two) times daily with a meal. 11/06/23   Almon Hercules, MD  cholecalciferol (VITAMIN D3) 25 MCG (1000 UNIT) tablet Take 1,000 Units by mouth daily.    [provider]  colchicine 0.6 MG tablet Take 1 tablet (0.6 mg total) by mouth daily as needed (gout flare). 11/06/23   Almon Hercules, MD  cyanocobalamin (VITAMIN B12) 1000 MCG/ML injection Inject 1 mL (1,000 mcg total) into the muscle See admin instructions. Inject into the muscle twice weekly on Monday, Friday for 4 weeks. Then inject once every 30 days starting on 12/08/23. 10/16/23   Cleora Fleet, MD  feeding supplement (ENSURE ENLIVE / ENSURE PLUS) LIQD Take 237 mLs by mouth 2 (two) times daily between meals. 10/16/23   Johnson, Clanford L, MD  ferrous sulfate 325 (65 FE) MG tablet Take 1 tablet (325 mg total) by mouth daily with breakfast. 10/17/23   Cleora Fleet, MD  Multiple Vitamin (MULTIVITAMIN WITH MINERALS) TABS tablet Take 1 tablet by mouth daily. 10/17/23   Johnson, Clanford L, MD  pantoprazole (PROTONIX) 40 MG tablet Take 1 tablet (40 mg total) by mouth daily. 08/05/23   Vassie Loll, MD  polyethylene glycol (MIRALAX / Ethelene Hal) 17  g packet Take 17 g by mouth 2 (two) times daily as needed for mild constipation. 11/06/23   Almon Hercules, MD  rosuvastatin (CRESTOR) 5 MG tablet Take 5 mg by mouth daily.    [provider]  senna-docusate (SENOKOT-S) 8.6-50 MG tablet Take 1 tablet by mouth 2 (two) times daily as needed for moderate constipation. 11/06/23   Almon Hercules, MD  spironolactone (ALDACTONE) 25 MG tablet Take 0.5 tablets (12.5 mg total) by mouth daily. 11/06/23   Almon Hercules, MD  Tiotropium Bromide Monohydrate (SPIRIVA RESPIMAT) 2.5 MCG/ACT AERS Inhale 2 puffs into the lungs daily.    [provider]  traZODone (DESYREL) 50 MG tablet Take 0.5 tablets (25 mg total) by mouth at bedtime as needed for sleep. 11/06/23   Almon Hercules, MD      Allergies    Penicillins    Review of Systems   Review of Systems  Physical Exam Updated Vital Signs BP (!) 143/84   Pulse 83   Temp 98.7 F (37.1 C) (Oral)   Resp (!) 21   Ht 6' (1.829 m)   Wt 74.7 kg   SpO2 93%   BMI 22.34 kg/m  Physical Exam Vitals and nursing note reviewed.  Constitutional:      General: He is not in acute distress.    Appearance: He is well-developed.  HENT:     Head: Normocephalic and atraumatic.     Comments: Head turned to the left.  Increase muscle tone of right SCM.    Right Ear: External ear normal.     Left Ear: External ear normal.     Nose: Nose normal.     Mouth/Throat:     Mouth: Mucous membranes are moist.     Pharynx: Oropharynx is clear.     Comments: Mallampati of 4.  Unable to visualize uvula or posterior oropharynx. Eyes:     Extraocular Movements: Extraocular movements intact.     Conjunctiva/sclera: Conjunctivae normal.     Pupils: Pupils are equal, round, and reactive to light.  Cardiovascular:     Rate and Rhythm: Normal rate. Rhythm irregular.     Heart sounds: Normal heart sounds.  Pulmonary:     Effort: Pulmonary effort is normal. No respiratory distress.     Breath sounds: Normal breath sounds. No stridor.  Musculoskeletal:     Cervical back: Normal range of motion and neck supple.  Skin:    General: Skin is warm and dry.  Neurological:     Mental Status: He is alert. Mental status is at baseline.  Psychiatric:        Mood and Affect: Mood normal.        Behavior: Behavior normal.     ED Results / Procedures / Treatments   Labs (all labs ordered are listed, but only abnormal results are displayed) Labs Reviewed  BASIC METABOLIC PANEL - Abnormal; Notable for the following components:      Result Value   Sodium 134 (*)    Glucose, Bld 105 (*)    All other  components within normal limits  CBC WITH DIFFERENTIAL/PLATELET - Abnormal; Notable for the following components:   WBC 11.0 (*)    RBC 3.33 (*)    Hemoglobin 9.8 (*)    HCT 29.9 (*)    RDW 16.6 (*)    Neutro Abs 9.0 (*)    All other components within normal limits  RESP PANEL BY RT-PCR (RSV, FLU A&B, COVID)  [id number]    EKG None  Radiology CT Soft Tissue Neck W Contrast Result Date: 12/10/2023 CLINICAL DATA:  Neck pain, sore throat for several days. EXAM: CT NECK WITH CONTRAST TECHNIQUE: Multidetector CT imaging of the neck was performed using the standard protocol following the bolus administration of intravenous contrast. RADIATION DOSE REDUCTION: This exam was performed according to the departmental dose-optimization program which includes automated exposure control, adjustment of the mA and/or kV according to patient size and/or use of iterative reconstruction technique. CONTRAST:  75mL OMNIPAQUE IOHEXOL 300 MG/ML  SOLN COMPARISON:  CTA head and neck 02/19/2022 FINDINGS: Suprahyoid neck: Nasopharynx is symmetric. Normal appearance of oral cavity and oropharynx. Tonsils are symmetric. Base of tongue is unremarkable. Normal appearance of the epiglottis. Retropharynx is unremarkable. Infrahyoid neck: No mass or swelling. Aryepiglottic folds and pyriform sinuses are symmetric. Vocal folds are symmetric. Salivary glands: The parotid and submandibular glands are symmetric. No inflammation, mass, or calcification. Lymph nodes: No cervical or supraclavicular lymphadenopathy. Thyroid: Subcentimeter nodule in the superior aspect of the right thyroid lobe. Vascular: Mild atherosclerosis of the visualized aortic arch. Atherosclerosis involving the bilateral subclavian arteries and the bilateral carotid bifurcations. There is at least mild stenosis at the origin of the left cervical ICA. Limited intracranial: Limited visualization of intracranial structures without focal abnormality. Visualized orbits: Orbits  are symmetric. Mastoids and visualized paranasal sinuses: Mucosal thickening in the left posterior ethmoid air cells and bilateral sphenoid sinuses. Additional mucosal thickening in the alveolar recesses of the maxillary sinuses. Skeleton: No acute or aggressive osseous lesion. Chronic deformity of the left lamina papyracea with herniation of extraconal fat into the adjacent ethmoid sinus. Degenerative changes in the cervical spine with disc space narrowing greatest at C5-6 and C6-7. Upper chest: Focal opacities in the posterior aspect of the left upper lobe are partially visualized. Possible small pleural effusion on the left. IMPRESSION: No acute inflammatory process noted in the neck. No evidence of peritonsillar abscess. No cervical lymphadenopathy. Partially visualized opacities in the posterior aspect of the left upper lobe. Possible left pleural effusion. Electronically Signed   By: Emily Filbert M.D.   On: 12/10/2023 11:22    Procedures Procedures    Medications Ordered in ED Medications  lidocaine (LIDODERM) 5 % 1 patch (1 patch Transdermal Patch Applied 12/10/23 1002)  diphenhydrAMINE (BENADRYL) injection 12.5 mg (12.5 mg Intravenous Given 12/10/23 0958)  acetaminophen (TYLENOL) tablet 1,000 mg (1,000 mg Oral Given 12/10/23 1003)  iohexol (OMNIPAQUE) 300 MG/ML solution 75 mL (75 mLs Intravenous Contrast Given 12/10/23 1039)    ED Course/ Medical Decision Making/ A&P Clinical Course as of 12/10/23 1226  Mon Dec 10, 2023  1129 CT Soft Tissue Neck W Contrast IMPRESSION: No acute inflammatory process noted in the neck. No evidence of peritonsillar abscess. No cervical lymphadenopathy. Partially visualized opacities in the posterior aspect of the left upper lobe. Possible left pleural effusion. [RP]    Clinical Course User Index [RP] Rondel Baton, MD                                 Medical Decision Making Amount and/or Complexity of Data Reviewed Labs: ordered. Radiology:  ordered. Decision-making details documented in ED Course.  Risk OTC drugs. Prescription drug management.   Don [name] is a 78 y.o. male with comorbidities that complicate the patient evaluation including CHF, liver cirrhosis, hepatitis C, hypertension, hyperlipidemia, and gout who presents to the emergency department  with right-sided neck pain.   Initial Ddx:  Torticollis, viral pharyngitis, strep pharyngitis, PTA, RPA, epiglottitis  MDM/Course:  Patient presents to the emergency department with right-sided neck pain.  Also has a sore throat.  No other infectious symptoms recently but it appears that there may have been some sick contacts at this facility.  On exam his head is turned to the left with increased tone of the SCM.  It appears that he has torticollis.  I was unable to fully visualize his posterior oropharynx so CT scan was obtained.  Did not show infectious cause of his symptoms.  Did show some pulmonary opacification but is not having any respiratory symptoms at this time so we will hold off on any antibiotics or diuresis.  COVID and flu are negative.  Upon re-evaluation was stable.  Based on his Centor score of 0 (assuming that he might have exudates since I cannot see his posterior oropharynx) do not feel that strep test is indicated.  Will him follow-up with his primary doctor in several days.  Counseled on symptomatic treatment for his torticollis.  Was given a prescription of cyclobenzaprine to take as well.  This patient presents to the ED for concern of complaints listed in HPI, this involves an extensive number of treatment options, and is a complaint that carries with it a high risk of complications and morbidity. Disposition including potential need for admission considered.   Dispo: DC to Facility  Additional history obtained from Nursing Home/Care Facility Records reviewed Outpatient Clinic Notes The following labs were independently interpreted: Chemistry and show  no acute abnormality I independently reviewed the following imaging with scope of interpretation limited to determining acute life threatening conditions related to emergency care:  CT neck  and agree with the radiologist interpretation with the following exceptions: none I personally reviewed and interpreted cardiac monitoring: normal sinus rhythm  and PVCs I personally reviewed and interpreted the pt's EKG: see above for interpretation  I have reviewed the patients home medications and made adjustments as needed Social Determinants of health:  Geriatric  Portions of this note were generated with Scientist, clinical (histocompatibility and immunogenetics). Dictation errors may occur despite best attempts at proofreading.     Final Clinical Impression(s) / ED Diagnoses Final diagnoses:  Torticollis  Neck pain    Rx / DC Orders ED Discharge Orders          Ordered    cyclobenzaprine (FLEXERIL) 10 MG tablet  2 times daily PRN        12/10/23 1131              Rondel Baton, MD 12/10/23 1226

## 2023-12-10 NOTE — ED Triage Notes (Signed)
 Pt c/o right side neck/throat pain for 2 days. Per EMS pt has a little lt side facial droop, slurred speech, and rt arm weakness at baseline. Pt states others being sick at the facility.

## 2024-01-11 NOTE — Progress Notes (Signed)
 Ordered to test for infection

## 2024-09-08 ENCOUNTER — Ambulatory Visit: Admitting: Physician Assistant

## 2024-09-09 ENCOUNTER — Ambulatory Visit: Admitting: Physician Assistant

## 2024-09-25 NOTE — Progress Notes (Deleted)
 " Cardiology Office Note:  .   Date:  09/25/2024  ID:  Don [name], DOB [birth year], MRN [id number] PCP: [name], NP  [facility] Providers Cardiologist:  Jayson Sierras, MD {  History of Present Illness: .   Don [name] is a 79 y.o. male  with PMHx of NICM, HFimpEF, liver cirrhosis, hepatitis C, hypotension (previously on midodrine ), anemia, HTN, HLD GERD, and gout who reports to [facility] office for overdue follow up.   Pertinent cardiac medical history:  HFimpEF  EF 20-25% in 03/2019, EF 45% in 07/2019, EF 50% in 2022, EF 55-60% in 09/2023 with most recent ECHO 55-60% in 10/2023  Last seen in heartcare *DATE* with *PROVIDER* for  Admitted 2/2-14/2025 for acute on send substernal chest pain and fever.  Troponin 77 >  1600 > 336. EKG: SVT, HR 158.  Elevated ESR/CRP. Diagnosed with acute myocarditis via cardiac MRI.  Underwent R/LHC on 2/7, which showed mild LAD, proximal LCx lesion, and high-grade ostial lesion of very small diagonal.  No stent placed.  Palliative medicine consulted and changed CODE STATUS to DNR/DNI.  Discontinued midodrine .  Continue daily ASA 81 mg daily, Coreg  6.25 mg twice daily, Crestor  5 mg daily, spironolactone  12.5 mg daily.  Today, reports ### and denies ###.  Denies chest pain, shortness of breath, palpitations, syncope, presyncope, dizziness, orthopnea, PND, swelling or significant weight changes, acute bleeding, or claudication.   Reports compliance with medications.  Dietary habitats:  Activity level:  Social: Denies tobacco use/alcohol/drug use  Denies any recent hospitalizations or visits to the emergency department.   Heart failure with improved ejection fraction (HFimpEF) (HCC) ECHO: Denies SOB, edema, orthopnea, or PND. Appears Euvolemic on exam.  Continue on  GDMT limited due to low blood pressure of .SABRA or AKI/CKD Encouraged low sodium diet, fluid restriction <2L, and daily weights.  Educated to contact our office for weight gain  of 2 lbs overnight or 5 lbs in one week. ED precautions discussed.       Latest Ref Rng & Units 12/10/2023    9:38 AM 11/08/2023    4:58 AM 11/06/2023    4:18 AM  BMP  Glucose 70 - 99 mg/dL 894  86  899   BUN 8 - 23 mg/dL 14  7  7    Creatinine 0.61 - 1.24 mg/dL 9.16  9.03  9.07   Sodium 135 - 145 mmol/L 134  134  137   Potassium 3.5 - 5.1 mmol/L 3.9  3.8  3.9   Chloride 98 - 111 mmol/L 99  99  101   CO2 22 - 32 mmol/L 26  24  25    Calcium  8.9 - 10.3 mg/dL 9.3  9.1  9.2     Essential hypertension Reports well controlled Home BP:  BP this OV well controlled today:  Continue on / Managed by GDMT as above.  Encourage physical activity for 150 minutes per week and heart healthy low sodium diet. Discussed limiting sodium intake to < 2 grams daily.     Hyperlipidemia LDL goal <70   Lipid Panel     Component Value Date/Time   CHOL 94 10/29/2023 0516   TRIG 67 10/29/2023 0516   HDL 41 10/29/2023 0516   CHOLHDL 2.3 10/29/2023 0516   VLDL 13 10/29/2023 0516   LDLCALC 40 10/29/2023 0516     ROS: 10 point review of system has been reviewed and considered negative except ones been listed in the HPI.   Studies Reviewed: .  Limited ECHO IMPRESSIONS 10/2023  1. No doppler analysis done.   2. Left ventricular ejection fraction, by estimation, is 55 to 60%. The  left ventricle has normal function. The left ventricle has no regional  wall motion abnormalities. There is mild concentric left ventricular  hypertrophy.   3. Right ventricular systolic function is normal. The right ventricular  size is normal.   4. Left atrial size was moderately dilated.   5. The mitral valve is grossly normal.   6. The aortic valve is tricuspid. No aortic stenosis is present.   7. The inferior vena cava is normal in size with greater than 50%  respiratory variability, suggesting right atrial pressure of 3 mmHg.   Comparison(s): The left ventricular function is unchanged.   Cath 10/2023   Mid LAD lesion  is 40% stenosed.   1st Diag lesion is 90% stenosed.   Prox Cx lesion is 40% stenosed.   1.  High-grade ostial lesion of very small diagonal which should be treated medically with mild to moderate disease elsewhere. 2.  Fick cardiac output of 9.7 L/min and Fick cardiac index of 4.9 L/min/m with the following hemodynamics:            Right atrial pressure mean of 15 mmHg            Right ventricular pressure 68/5 with an end-diastolic pressure of 28 mmHg            Wedge pressure mean of 27 mmHg with V waves to 30 mmHg            PA pressure 70/29 with a mean of 47 mmHg            PVR 2.1 Woods units            PA pulsatility index of 2.7 3.  LVEDP of 24 mmHg   Recommendation: The patient was administered 40 mg of Lasix  in the cardiac catheterization laboratory; continue diuretic therapy and medical optimization.  The results were reviewed with Dr. Mona.  CV Studies: Cardiac studies reviewed are outlined and summarized above. Otherwise please see EMR for full report.   Risk Assessment/Calculations:   {Does this patient have ATRIAL FIBRILLATION?:[phone]} No BP recorded.  {Refresh Note OR Click here to enter BP  :1}***       Physical Exam:   VS:  There were no vitals taken for this visit.   Wt Readings from Last 3 Encounters:  12/10/23 164 lb 10.9 oz (74.7 kg)  10/30/23 164 lb 10.9 oz (74.7 kg)  10/14/23 164 lb 14.5 oz (74.8 kg)    GEN: Well nourished, well developed in no acute distress while sitting in chair.  NECK: No JVD; No carotid bruits CARDIAC: ***RRR, no murmurs, rubs, gallops RESPIRATORY:  Clear to auscultation without rales, wheezing or rhonchi  ABDOMEN: Soft, non-tender, non-distended EXTREMITIES:  No edema; No deformity   ASSESSMENT AND PLAN: .   ***    {Are you ordering a CV Procedure (e.g. stress test, cath, DCCV, TEE, etc)?   Press F2        :[id number]}  Dispo: ***  Signed, [name], PA-C  "

## 2024-09-26 ENCOUNTER — Ambulatory Visit: Attending: Physician Assistant | Admitting: Physician Assistant

## 2024-09-26 DIAGNOSIS — E785 Hyperlipidemia, unspecified: Secondary | ICD-10-CM

## 2024-09-26 DIAGNOSIS — I5020 Unspecified systolic (congestive) heart failure: Secondary | ICD-10-CM

## 2024-09-26 DIAGNOSIS — I10 Essential (primary) hypertension: Secondary | ICD-10-CM
